# Patient Record
Sex: Female | Born: 1965 | Race: Black or African American | Hispanic: No | Marital: Single | Smoking: Never smoker
Health system: Southern US, Community
[De-identification: ages and names within clinical notes are randomized; demographics above are authoritative.]

## PROBLEM LIST (undated history)

## (undated) DIAGNOSIS — I10 Essential (primary) hypertension: Secondary | ICD-10-CM

## (undated) DIAGNOSIS — R9431 Abnormal electrocardiogram [ECG] [EKG]: Secondary | ICD-10-CM

## (undated) DIAGNOSIS — Z91199 Patient's noncompliance with other medical treatment and regimen due to unspecified reason: Secondary | ICD-10-CM

## (undated) DIAGNOSIS — E119 Type 2 diabetes mellitus without complications: Secondary | ICD-10-CM

## (undated) DIAGNOSIS — E785 Hyperlipidemia, unspecified: Secondary | ICD-10-CM

## (undated) DIAGNOSIS — T7840XA Allergy, unspecified, initial encounter: Secondary | ICD-10-CM

## (undated) DIAGNOSIS — I639 Cerebral infarction, unspecified: Secondary | ICD-10-CM

## (undated) DIAGNOSIS — K219 Gastro-esophageal reflux disease without esophagitis: Secondary | ICD-10-CM

## (undated) HISTORY — DX: Cerebral infarction, unspecified: I63.9

## (undated) HISTORY — DX: Allergy, unspecified, initial encounter: T78.40XA

## (undated) HISTORY — DX: Abnormal electrocardiogram (ECG) (EKG): R94.31

## (undated) HISTORY — DX: Essential (primary) hypertension: I10

## (undated) HISTORY — DX: Hyperlipidemia, unspecified: E78.5

## (undated) HISTORY — PX: TUBAL LIGATION: SHX77

## (undated) HISTORY — DX: Patient's noncompliance with other medical treatment and regimen due to unspecified reason: Z91.199

---

## 1999-11-17 ENCOUNTER — Other Ambulatory Visit: Admission: RE | Admit: 1999-11-17 | Discharge: 1999-11-17 | Payer: Self-pay | Admitting: Family Medicine

## 2000-06-15 ENCOUNTER — Other Ambulatory Visit: Admission: RE | Admit: 2000-06-15 | Discharge: 2000-06-15 | Payer: Self-pay | Admitting: Family Medicine

## 2004-10-14 ENCOUNTER — Ambulatory Visit: Payer: Self-pay | Admitting: *Deleted

## 2005-02-09 ENCOUNTER — Ambulatory Visit: Payer: Self-pay | Admitting: Internal Medicine

## 2005-02-10 ENCOUNTER — Ambulatory Visit: Payer: Self-pay | Admitting: Internal Medicine

## 2005-12-03 ENCOUNTER — Ambulatory Visit: Payer: Self-pay | Admitting: Internal Medicine

## 2005-12-25 ENCOUNTER — Ambulatory Visit: Payer: Self-pay | Admitting: Internal Medicine

## 2006-02-09 ENCOUNTER — Other Ambulatory Visit: Admission: RE | Admit: 2006-02-09 | Discharge: 2006-02-09 | Payer: Self-pay | Admitting: Internal Medicine

## 2006-02-09 ENCOUNTER — Encounter: Payer: Self-pay | Admitting: Internal Medicine

## 2006-02-09 ENCOUNTER — Ambulatory Visit: Payer: Self-pay | Admitting: Internal Medicine

## 2006-11-26 ENCOUNTER — Ambulatory Visit: Payer: Self-pay | Admitting: Internal Medicine

## 2007-07-12 DIAGNOSIS — I10 Essential (primary) hypertension: Secondary | ICD-10-CM | POA: Insufficient documentation

## 2007-07-18 DIAGNOSIS — E785 Hyperlipidemia, unspecified: Secondary | ICD-10-CM | POA: Insufficient documentation

## 2007-09-30 ENCOUNTER — Ambulatory Visit: Payer: Self-pay | Admitting: Nurse Practitioner

## 2008-03-02 ENCOUNTER — Ambulatory Visit: Payer: Self-pay | Admitting: Nurse Practitioner

## 2008-03-02 DIAGNOSIS — J309 Allergic rhinitis, unspecified: Secondary | ICD-10-CM | POA: Insufficient documentation

## 2008-03-02 LAB — CONVERTED CEMR LAB
AST: 17 units/L (ref 0–37)
Albumin: 4.2 g/dL (ref 3.5–5.2)
Alkaline Phosphatase: 84 units/L (ref 39–117)
BUN: 8 mg/dL (ref 6–23)
Basophils Absolute: 0 10*3/uL (ref 0.0–0.1)
Basophils Relative: 0 % (ref 0–1)
Bilirubin Urine: NEGATIVE
Creatinine, Ser: 0.85 mg/dL (ref 0.40–1.20)
Eosinophils Absolute: 0.2 10*3/uL (ref 0.0–0.7)
Glucose, Urine, Semiquant: NEGATIVE
KOH Prep: NEGATIVE
Ketones, urine, test strip: NEGATIVE
MCHC: 33.6 g/dL (ref 30.0–36.0)
MCV: 80.9 fL (ref 78.0–100.0)
Monocytes Relative: 11 % (ref 3–12)
Neutrophils Relative %: 57 % (ref 43–77)
Pap Smear: NEGATIVE
Potassium: 4.1 meq/L (ref 3.5–5.3)
RBC: 5.12 M/uL — ABNORMAL HIGH (ref 3.87–5.11)
RDW: 13.9 % (ref 11.5–15.5)
Specific Gravity, Urine: 1.02
TSH: 2.663 microintl units/mL (ref 0.350–5.50)
Urobilinogen, UA: NEGATIVE
pH: 6.5

## 2008-03-08 ENCOUNTER — Encounter (INDEPENDENT_AMBULATORY_CARE_PROVIDER_SITE_OTHER): Payer: Self-pay | Admitting: Nurse Practitioner

## 2008-04-18 ENCOUNTER — Ambulatory Visit: Payer: Self-pay | Admitting: Nurse Practitioner

## 2008-05-02 ENCOUNTER — Ambulatory Visit: Payer: Self-pay | Admitting: Nurse Practitioner

## 2009-01-28 ENCOUNTER — Observation Stay (HOSPITAL_COMMUNITY): Admission: EM | Admit: 2009-01-28 | Discharge: 2009-01-30 | Payer: Self-pay | Admitting: Family Medicine

## 2009-01-28 DIAGNOSIS — Z8711 Personal history of peptic ulcer disease: Secondary | ICD-10-CM

## 2009-01-28 HISTORY — DX: Personal history of peptic ulcer disease: Z87.11

## 2009-01-29 ENCOUNTER — Encounter (INDEPENDENT_AMBULATORY_CARE_PROVIDER_SITE_OTHER): Payer: Self-pay | Admitting: Internal Medicine

## 2009-01-30 ENCOUNTER — Encounter (INDEPENDENT_AMBULATORY_CARE_PROVIDER_SITE_OTHER): Payer: Self-pay | Admitting: Nurse Practitioner

## 2009-02-27 ENCOUNTER — Ambulatory Visit: Payer: Self-pay | Admitting: Nurse Practitioner

## 2009-02-28 LAB — CONVERTED CEMR LAB
ALT: 27 units/L (ref 0–35)
CO2: 28 meq/L (ref 19–32)
Calcium: 9.7 mg/dL (ref 8.4–10.5)
Chloride: 102 meq/L (ref 96–112)
Cholesterol: 210 mg/dL — ABNORMAL HIGH (ref 0–200)
Lymphs Abs: 1.9 10*3/uL (ref 0.7–4.0)
Monocytes Relative: 9 % (ref 3–12)
Neutro Abs: 3.2 10*3/uL (ref 1.7–7.7)
Neutrophils Relative %: 55 % (ref 43–77)
RBC: 5.53 M/uL — ABNORMAL HIGH (ref 3.87–5.11)
Sodium: 138 meq/L (ref 135–145)
Total Bilirubin: 0.7 mg/dL (ref 0.3–1.2)
Total Protein: 7.8 g/dL (ref 6.0–8.3)
VLDL: 23 mg/dL (ref 0–40)
WBC: 6 10*3/uL (ref 4.0–10.5)

## 2009-03-22 ENCOUNTER — Ambulatory Visit: Payer: Self-pay | Admitting: Nurse Practitioner

## 2009-03-22 ENCOUNTER — Encounter (INDEPENDENT_AMBULATORY_CARE_PROVIDER_SITE_OTHER): Payer: Self-pay | Admitting: Nurse Practitioner

## 2009-03-22 DIAGNOSIS — B373 Candidiasis of vulva and vagina: Secondary | ICD-10-CM | POA: Insufficient documentation

## 2009-03-22 DIAGNOSIS — K5900 Constipation, unspecified: Secondary | ICD-10-CM | POA: Insufficient documentation

## 2009-03-22 HISTORY — DX: Constipation, unspecified: K59.00

## 2009-03-22 LAB — CONVERTED CEMR LAB
GC Probe Amp, Genital: NEGATIVE
Ketones, urine, test strip: NEGATIVE
LDL Goal: 160 mg/dL
Microalb, Ur: 0.85 mg/dL (ref 0.00–1.89)
Nitrite: NEGATIVE
Preg, Serum: NEGATIVE
Specific Gravity, Urine: 1.015

## 2009-03-25 ENCOUNTER — Encounter (INDEPENDENT_AMBULATORY_CARE_PROVIDER_SITE_OTHER): Payer: Self-pay | Admitting: Nurse Practitioner

## 2009-03-26 ENCOUNTER — Encounter (INDEPENDENT_AMBULATORY_CARE_PROVIDER_SITE_OTHER): Payer: Self-pay | Admitting: Nurse Practitioner

## 2009-04-09 ENCOUNTER — Telehealth (INDEPENDENT_AMBULATORY_CARE_PROVIDER_SITE_OTHER): Payer: Self-pay | Admitting: Nurse Practitioner

## 2009-05-07 ENCOUNTER — Emergency Department (HOSPITAL_COMMUNITY): Admission: EM | Admit: 2009-05-07 | Discharge: 2009-05-07 | Payer: Self-pay | Admitting: Emergency Medicine

## 2009-05-07 ENCOUNTER — Emergency Department (HOSPITAL_COMMUNITY): Admission: EM | Admit: 2009-05-07 | Discharge: 2009-05-07 | Payer: Self-pay | Admitting: Family Medicine

## 2009-09-19 ENCOUNTER — Ambulatory Visit: Payer: Self-pay | Admitting: Nurse Practitioner

## 2009-09-27 ENCOUNTER — Ambulatory Visit (HOSPITAL_COMMUNITY): Admission: RE | Admit: 2009-09-27 | Discharge: 2009-09-27 | Payer: Self-pay | Admitting: Internal Medicine

## 2009-09-27 ENCOUNTER — Telehealth (INDEPENDENT_AMBULATORY_CARE_PROVIDER_SITE_OTHER): Payer: Self-pay | Admitting: Nurse Practitioner

## 2010-01-06 ENCOUNTER — Ambulatory Visit: Payer: Self-pay | Admitting: Nurse Practitioner

## 2010-01-06 DIAGNOSIS — R5383 Other fatigue: Secondary | ICD-10-CM

## 2010-01-06 DIAGNOSIS — R5381 Other malaise: Secondary | ICD-10-CM | POA: Insufficient documentation

## 2010-01-06 LAB — CONVERTED CEMR LAB
MCV: 81 fL (ref 78.0–100.0)
RBC: 5.1 M/uL (ref 3.87–5.11)
Retic Ct Pct: 0.9 % (ref 0.4–3.1)
WBC: 4.6 10*3/uL (ref 4.0–10.5)

## 2010-01-09 ENCOUNTER — Encounter (INDEPENDENT_AMBULATORY_CARE_PROVIDER_SITE_OTHER): Payer: Self-pay | Admitting: Nurse Practitioner

## 2010-05-02 ENCOUNTER — Telehealth (INDEPENDENT_AMBULATORY_CARE_PROVIDER_SITE_OTHER): Payer: Self-pay | Admitting: Nurse Practitioner

## 2010-06-02 ENCOUNTER — Telehealth (INDEPENDENT_AMBULATORY_CARE_PROVIDER_SITE_OTHER): Payer: Self-pay | Admitting: Nurse Practitioner

## 2010-06-05 ENCOUNTER — Encounter (INDEPENDENT_AMBULATORY_CARE_PROVIDER_SITE_OTHER): Payer: Self-pay | Admitting: Nurse Practitioner

## 2010-11-30 ENCOUNTER — Encounter: Payer: Self-pay | Admitting: Internal Medicine

## 2010-12-01 ENCOUNTER — Encounter: Payer: Self-pay | Admitting: Internal Medicine

## 2010-12-02 ENCOUNTER — Ambulatory Visit (HOSPITAL_COMMUNITY)
Admission: RE | Admit: 2010-12-02 | Discharge: 2010-12-02 | Payer: Self-pay | Source: Home / Self Care | Attending: Internal Medicine | Admitting: Internal Medicine

## 2010-12-09 NOTE — Letter (Signed)
Summary: Generic Letter  [facility]  [address] [place], [place] [address]   Phone: [phone]  Fax: [phone]       06/05/2010  KIMBELY WHITEAKER [address] [place], [place]  [address]  Dear Ms. Stash,  We have been unable to contact you by telephone.  Please call our office, at your earliest convenience, so that we may speak with you.   Sincerely,   Dutch Quint RN

## 2010-12-09 NOTE — Progress Notes (Signed)
Summary: REFILL ON BP AND BC PILLS  Phone Note Call from Patient Call back at Home Phone [phone]   Reason for Call: Refill Medication Summary of Call: MARTIN PT. MS Parks CALLED AND NEEDS HER BP MEDICATION AND BIRTHCONTROL MEDICATION. [facility] [address] Initial call taken by: Leodis Rains,  May 02, 2010 10:49 AM  Follow-up for Phone Call        desogen last filled 04/09/2009 #11 benazepril, metoprolol last filled 01/06/10 Follow-up by: Levon Hedger,  May 02, 2010 11:22 AM  Additional Follow-up for Phone Call Additional follow up Details #1::        Needs CPP. Refill OCP x 1 month and defer continuing med to PCP. Rx in basket . . . [facility] [address] ([facility]) . . to fax Additional Follow-up by: Brynda Rim,  May 02, 2010 2:38 PM    Additional Follow-up for Phone Call Additional follow up Details #2::    [facility] [place]. pt informed. Follow-up by: Levon Hedger,  May 02, 2010 3:33 PM  Prescriptions: DESOGEN 0.15-30 MG-MCG TABS (DESOGESTREL-ETHINYL ESTRADIOL) One tablet by mouth daily for contraception  #1 package x 0   Entered and Authorized by:   Tereso Newcomer PA-C   Signed by:   Tereso Newcomer PA-C on 05/02/2010   Method used:   Printed then faxed to ...       [facility]  [address] [place] [phone]* (retail)       [phone] [address] [place]  [address]       Ph: [phone] or [phone]       Fax: [phone]   RxID:   [id number] METOPROLOL TARTRATE 25 MG TABS (METOPROLOL TARTRATE) One tablet by mouth daily  #30 x 2   Entered and Authorized by:   Tereso Newcomer PA-C   Signed by:   Tereso Newcomer PA-C on 05/02/2010   Method used:   Printed then faxed to ...       [facility] [place] [phone][facility] ([facility]ail)       [phone] [address], [place]  [address]       Ph: [phone] or [phone]       Fax: [phone]   RxID:   [id number] BENAZEPRIL-HYDROCHLOROTHIAZIDE 20-25 MG TABS (BENAZEPRIL-HYDROCHLOROTHIAZIDE) One tablet  by mouth daily for blood pressure  #30 x 2   Entered and Authorized by:   Tereso Newcomer PA-C   Signed by:   Tereso Newcomer PA-C on 05/02/2010   Method used:   Printed then faxed to ...       [facility] [place] [phone]* (retail)       [phone] [address]       [place], [place]  [address]       Ph: [phone] or [phone]       Fax: [phone]   RxID:   [id number]

## 2010-12-09 NOTE — Letter (Signed)
Summary: Lipid Letter  [facility]  [address] [place], [place] [address]   Phone: [phone]  Fax: [phone]    01/09/2010  Kathleen Larson [address] [place], [place]  [address]  Dear [name]:  We have carefully reviewed your last lipid profile from 01/06/2010 and the results are noted below with a summary of recommendations for lipid management.    Cholesterol:       200     Goal: less than 200   HDL "good" Cholesterol:   46     Goal: greater than 40   LDL "bad" Cholesterol:   133     Goal: less than 130   Triglycerides:       107     Goal: less than 150    Your cholesterol is doing well.  Continue your current medications. All other labs are ok.    Current Medications: 1)    Benazepril-hydrochlorothiazide 20-25 Mg Tabs (Benazepril-hydrochlorothiazide) .... One tablet by mouth daily for blood pressure 2)    Aciphex 20 Mg Tbec (Rabeprazole sodium) .... One tablet by mouth two times a day for stomach 3)    Metoprolol Tartrate 25 Mg Tabs (Metoprolol tartrate) .... One tablet by mouth daily 4)    Pravachol 40 Mg Tabs (Pravastatin sodium) .... One tablet by mouth nightly for cholesterol 5)    Desogen 0.15-30 Mg-mcg Tabs (Desogestrel-ethinyl estradiol) .... One tablet by mouth daily for contraception 6)    Metamucil Smooth Texture 28.3 % Powd (Psyllium) .... Mix daily with full glass of water 7)    Loratadine 10 Mg Tabs (Loratadine) .... One tablet by mouth daily for allergies  If you have any questions, please call. We appreciate being able to work with you.   Sincerely,    [facility] Lehman Prom FNP

## 2010-12-09 NOTE — Progress Notes (Signed)
Summary: Refills filled x1 -- needs CPE  Phone Note Call from Patient Call back at [phone]   Summary of Call: Pt needs more refills from herbirth control and bp medications. [facility] [place] FNP Initial call taken by: Manon Hilding,  June 02, 2010 4:52 PM  Follow-up for Phone Call        Sent to N. Daphine Deutscher.  Dutch Quint RN  June 03, 2010 11:58 AM   Additional Follow-up for Phone Call Additional follow up Details #1::        Pt has completed her birth control pills which means it is time for her PAP last Rx given 04/2009 with 11 refills will send Rx to pharmacy for 1 with 1 refill but pt needs a CPE Additional Follow-up by: Lehman Prom FNP,  June 03, 2010 1:49 PM    Additional Follow-up for Phone Call Additional follow up Details #2::    Unable to leave message -- no answer.  Dutch Quint RN  June 03, 2010 3:59 PM  Unable to leave message -- no answer.  Dutch Quint RN  June 04, 2010 12:51 PM  Unable to leave message -- no answer.  Letter sent.  Dutch Quint RN  June 05, 2010 9:40 AM   Prescriptions: DESOGEN 0.15-30 MG-MCG TABS (DESOGESTREL-ETHINYL ESTRADIOL) One tablet by mouth daily for contraception  #1 package x 1   Entered and Authorized by:   Lehman Prom FNP   Signed by:   Lehman Prom FNP on 06/03/2010   Method used:   Faxed to ...       [facility] (retail)       [address] [place], [place]  [address]       Ph: [phone]       Fax: [phone]   RxID:   [id number] BENAZEPRIL-HYDROCHLOROTHIAZIDE 20-25 MG TABS (BENAZEPRIL-HYDROCHLOROTHIAZIDE) One tablet by mouth daily for blood pressure  #30 x 5   Entered and Authorized by:   Lehman Prom FNP   Signed by:   Lehman Prom FNP on 06/03/2010   Method used:   Faxed to ...       [facility] (retail)       [address] [place], [place]  [address]       Ph: [phone]       Fax:  [phone]   RxID:   [id number]

## 2010-12-09 NOTE — Assessment & Plan Note (Signed)
Summary: HTN   Vital Signs:  Patient profile:   45 year old female Menstrual status:  regular LMP:     01/02/2010 Height:      64 inches Weight:      225.7 pounds Temp:     98.0 degrees F oral Pulse rate:   74 / minute Pulse rhythm:   regular Resp:     20 per minute BP sitting:   136 / 83  (left arm) Cuff size:   large  Vitals Entered By: Levon Hedger (January 06, 2010 10:11 AM) CC: Renew meds, Hypertension Management, Lipid Management Is Patient Diabetic? No Pain Assessment Patient in pain? no       Does patient need assistance? Functional Status Self care Ambulation Normal LMP (date): 01/02/2010 LMP - Character: normal    Menses interval (days): 28 Menstrual flow (days): 5 Menstrual Status regular Enter LMP: 01/02/2010 Last PAP Result  Specimen Adequacy: Satisfactory for evaluation.   Interpretation/Result:Negative for intraepithelial Lesion or Malignancy.      CC:  Renew meds, Hypertension Management, and Lipid Management.  History of Present Illness:  Pt into the office for f/u on htn. Pt has completed her supply of BP meds - 2 days ago.     Hypertension History:      She denies headache, chest pain, and palpitations.  She notes no problems with any antihypertensive medication side effects.  Needs refills on meds.        Positive major cardiovascular risk factors include hyperlipidemia and hypertension.  Negative major cardiovascular risk factors include female age less than 33 years old, no history of diabetes, and non-tobacco-user status.        Further assessment for target organ damage reveals no history of ASHD, cardiac end-organ damage (CHF/LVH), stroke/TIA, peripheral vascular disease, renal insufficiency, or hypertensive retinopathy.    Lipid Management History:      Positive NCEP/ATP III risk factors include hypertension.  Negative NCEP/ATP III risk factors include female age less than 3 years old, no history of early menopause without  estrogen hormone replacement, non-diabetic, non-tobacco-user status, no ASHD (atherosclerotic heart disease), no prior stroke/TIA, no peripheral vascular disease, and no history of aortic aneurysm.        The patient states that she knows about the "Therapeutic Lifestyle Change" diet.  Her compliance with the TLC diet is fair.  The patient does not know about adjunctive measures for cholesterol lowering.  She expresses no side effects from her lipid-lowering medication.  Comments include: Pharmacy profile indicates that pt has not picked up this medications ince 10/2009.  The patient denies any symptoms to suggest myopathy or liver disease.      Habits & Providers  Alcohol-Tobacco-Diet     Alcohol drinks/day: 0     Tobacco Status: never  Exercise-Depression-Behavior     Does Patient Exercise: yes     Exercise Counseling: not indicated; exercise is adequate     Type of exercise: walking     Exercise (avg: min/session): <30     Times/week: 3     Have you felt down or hopeless? no     Have you felt little pleasure in things? no     Depression Counseling: not indicated; screening negative for depression     Drug Use: never  Allergies (verified): No Known Drug Allergies  Review of Systems General:  Complains of weakness; denies fever; Noticed over the past 1 months weakness and fatigue has been more pronounced.  Taking metoprolol at in the afternoon.  No current vitamins.  Resting and night and feels like she is getting a good night of rest. . CV:  Complains of fatigue; denies chest pain or discomfort; no CP or fatigue related to shortness of breath. Neuro:  Denies headaches.  Physical Exam  General:  alert.   Head:  normocephalic.   Lungs:  normal breath sounds.   Heart:  normal rate and regular rhythm.   Msk:  normal ROM.   Neurologic:  gait normal.   Skin:  color normal.   Psych:  Oriented X3 and memory intact for recent and remote.     Impression & Recommendations:  Problem  # 1:  HYPERTENSION (ICD-401.9) DASH diet will refill meds Her updated medication list for this problem includes:    Benazepril-hydrochlorothiazide 20-25 Mg Tabs (Benazepril-hydrochlorothiazide) ..... One tablet by mouth daily for blood pressure    Metoprolol Tartrate 25 Mg Tabs (Metoprolol tartrate) ..... One tablet by mouth daily  Problem # 2:  HYPERLIPIDEMIA (ICD-272.4) will check lipids today advised pt to take meds daily Her updated medication list for this problem includes:    Pravachol 40 Mg Tabs (Pravastatin sodium) ..... One tablet by mouth nightly for cholesterol  Orders: T-Lipid Profile ([id number]) T-Comprehensive Metabolic Panel ([id number])  Problem # 3:  FATIGUE (ICD-780.79) will check labs for possible cause Orders: T- * Misc. Laboratory test [phone]) T-TSH [phone]) T-Vitamin D 25-Hydroxy & 1,25 Dihydroxy [phone])  Complete Medication List: 1)  Benazepril-hydrochlorothiazide 20-25 Mg Tabs (Benazepril-hydrochlorothiazide) .... One tablet by mouth daily for blood pressure 2)  Aciphex 20 Mg Tbec (Rabeprazole sodium) .... One tablet by mouth two times a day for stomach 3)  Metoprolol Tartrate 25 Mg Tabs (Metoprolol tartrate) .... One tablet by mouth daily 4)  Pravachol 40 Mg Tabs (Pravastatin sodium) .... One tablet by mouth nightly for cholesterol 5)  Desogen 0.15-30 Mg-mcg Tabs (Desogestrel-ethinyl estradiol) .... One tablet by mouth daily for contraception 6)  Metamucil Smooth Texture 28.3 % Powd (Psyllium) .... Mix daily with full glass of water 7)  Loratadine 10 Mg Tabs (Loratadine) .... One tablet by mouth daily for allergies  Hypertension Assessment/Plan:      The patient's hypertensive risk group is category B: At least one risk factor (excluding diabetes) with no target organ damage.  Her calculated 10 year risk of coronary heart disease is 4 %.  Today's blood pressure is 136/83.  Her blood pressure goal is < 140/90.  Lipid Assessment/Plan:      Based on  NCEP/ATP III, the patient's risk factor category is "0-1 risk factors".  The patient's lipid goals are as follows: Total cholesterol goal is 200; LDL cholesterol goal is 160; HDL cholesterol goal is 40; Triglyceride goal is 150.    Patient Instructions: 1)  Your labs will be checked today as a possible cause for fatigue. 2)  You will be notified of you need any supplements. 3)  Also you should start to walk or do some sort of exercise at least 3 times per week. 4)  Follow up in 6 month for complete physical exam. 5)  Will need PHQ-9, PAP, EKG Prescriptions: PRAVACHOL 40 MG TABS (PRAVASTATIN SODIUM) One tablet by mouth nightly for cholesterol  #30 x 5   Entered and Authorized by:   Lehman Prom FNP   Signed by:   Lehman Prom FNP on 01/06/2010   Method used:   Faxed to ...       [facility] (retail)  [address] [place], [place]  [address]       Ph: [phone]       Fax: [phone]   RxID:   [id number] METOPROLOL TARTRATE 25 MG TABS (METOPROLOL TARTRATE) One tablet by mouth daily  #30 x 5   Entered and Authorized by:   Lehman Prom FNP   Signed by:   Lehman Prom FNP on 01/06/2010   Method used:   Faxed to ...       [facility] - [facility] (retail)       [address] [place], [place]  [address]       Ph: [phone]       Fax: [phone]   RxID:   [id number] BENAZEPRIL-HYDROCHLOROTHIAZIDE 20-25 MG TABS (BENAZEPRIL-HYDROCHLOROTHIAZIDE) One tablet by mouth daily for blood pressure  #30 x 5   Entered and Authorized by:   Lehman Prom FNP   Signed by:   Lehman Prom FNP on 01/06/2010   Method used:   Faxed to ...       [facility] - [facility] (retail)       [address] [place], [place]  [address]       Ph: [phone]       Fax: [phone]   RxID:   [id number]

## 2010-12-26 ENCOUNTER — Encounter (INDEPENDENT_AMBULATORY_CARE_PROVIDER_SITE_OTHER): Payer: Self-pay | Admitting: *Deleted

## 2010-12-31 NOTE — Letter (Signed)
Summary: *HSN Results Follow up  [facility]  [address] [place], [place] [address]   Phone: [phone]  Fax: [phone]      12/26/2010   AYLEE LITTRELL [address] [place], [place]  [address]   Dear  Ms. Janene Madeira,                            ____S.Drinkard,FNP   ____D. Gore,FNP       ____B. McPherson,MD   ____V. Rankins,MD    ____E. Mulberry,MD    ____N. Daphine Deutscher, FNP  ____D. Reche Dixon, MD    ____K. Philipp Deputy, MD    ____Other     This letter is to inform you that your recent test(s):  _______Pap Smear    _______Lab Test     _______X-ray    _______ is within acceptable limits  _______ requires a medication change  _______ requires a follow-up lab visit  ___X____ requires a follow-up visit with your provider   Comments:  We have been trying to reach you.  Please give [place] a call.       _________________________________________________________ If you have any questions, please contact our office                     Sincerely,  [place] Shannon [facility]

## 2011-01-14 ENCOUNTER — Emergency Department (HOSPITAL_COMMUNITY): Payer: Self-pay

## 2011-01-14 ENCOUNTER — Emergency Department (HOSPITAL_COMMUNITY)
Admission: EM | Admit: 2011-01-14 | Discharge: 2011-01-14 | Disposition: A | Discharge status: Home / Self Care | Payer: Self-pay | Attending: Emergency Medicine | Admitting: Emergency Medicine

## 2011-01-14 DIAGNOSIS — E669 Obesity, unspecified: Secondary | ICD-10-CM | POA: Insufficient documentation

## 2011-01-14 DIAGNOSIS — I10 Essential (primary) hypertension: Secondary | ICD-10-CM | POA: Insufficient documentation

## 2011-01-14 DIAGNOSIS — K219 Gastro-esophageal reflux disease without esophagitis: Secondary | ICD-10-CM | POA: Insufficient documentation

## 2011-01-14 DIAGNOSIS — R1011 Right upper quadrant pain: Secondary | ICD-10-CM | POA: Insufficient documentation

## 2011-01-14 DIAGNOSIS — K7689 Other specified diseases of liver: Secondary | ICD-10-CM | POA: Insufficient documentation

## 2011-01-14 DIAGNOSIS — N9489 Other specified conditions associated with female genital organs and menstrual cycle: Secondary | ICD-10-CM | POA: Insufficient documentation

## 2011-01-14 LAB — CBC
Platelets: 283 10*3/uL (ref 150–400)
RBC: 5.22 MIL/uL — ABNORMAL HIGH (ref 3.87–5.11)
RDW: 14.2 % (ref 11.5–15.5)
WBC: 8 10*3/uL (ref 4.0–10.5)

## 2011-01-14 LAB — COMPREHENSIVE METABOLIC PANEL
AST: 24 U/L (ref 0–37)
Albumin: 3.5 g/dL (ref 3.5–5.2)
Chloride: 102 mEq/L (ref 96–112)
Creatinine, Ser: 0.86 mg/dL (ref 0.4–1.2)
GFR calc Af Amer: >60 mL/min (ref >60)
Potassium: 3.3 mEq/L — ABNORMAL LOW (ref 3.5–5.1)
Total Bilirubin: 0.9 mg/dL (ref 0.3–1.2)
Total Protein: 7 g/dL (ref 6.0–8.3)

## 2011-01-14 LAB — DIFFERENTIAL
Basophils Relative: 0 % (ref 0–1)
Eosinophils Absolute: 0.2 10*3/uL (ref 0.0–0.7)
Eosinophils Relative: 2 % (ref 0–5)
Neutrophils Relative %: 61 % (ref 43–77)

## 2011-01-14 LAB — URINALYSIS, ROUTINE W REFLEX MICROSCOPIC
Nitrite: NEGATIVE
Protein, ur: NEGATIVE mg/dL
Urobilinogen, UA: 1 mg/dL (ref 0.0–1.0)

## 2011-01-14 MED ORDER — IOHEXOL 300 MG/ML  SOLN
100.0000 mL | Freq: Once | INTRAMUSCULAR | Status: AC | PRN
  Administered 2011-01-14: 100 mL via INTRAVENOUS

## 2011-02-02 ENCOUNTER — Encounter (INDEPENDENT_AMBULATORY_CARE_PROVIDER_SITE_OTHER): Payer: Self-pay | Admitting: Nurse Practitioner

## 2011-02-10 NOTE — Miscellaneous (Signed)
Summary: Query refill Benazepril-hctz  Clinical Lists Changes Pt. last seen 01/06/10, last filled 06/03/10 #30 with 5 refills Benazepril-HCTZ. Do you want to refill?  Refill for 30 days with 1 refill. advise pt that she needs f/u with a provider and come fasting for labs n.martin,fnp February 02, 2011 11:52 AM  Med refilled [facility], then cx and refilled [facility]. Called [phone] no answer no voice mail. Called pt. -- no answer.  Dutch Quint RN  February 03, 2011 9:58 AM  No answer.  Dutch Quint RN  February 04, 2011 8:43 AM  No answer, letter mailed Gaylyn Cheers RN  February 05, 2011 12:14 PM  Medications: Rx of BENAZEPRIL-HYDROCHLOROTHIAZIDE 20-25 MG TABS (BENAZEPRIL-HYDROCHLOROTHIAZIDE) One tablet by mouth daily for blood pressure;  #30 x 1;  Signed;  Entered by: Gaylyn Cheers RN;  Authorized by: Lehman Prom FNP;  Method used: Faxed to [facility], [address], [place], [place]  [address], Ph: [phone], Fax: [phone] Rx of BENAZEPRIL-HYDROCHLOROTHIAZIDE 20-25 MG TABS (BENAZEPRIL-HYDROCHLOROTHIAZIDE) One tablet by mouth daily for blood pressure;  #30 x 1;  Signed;  Entered by: Gaylyn Cheers RN;  Authorized by: Lehman Prom FNP;  Method used: Electronically to [facility] [address], [place], [place], [place]  [address], Ph: [phone], Fax: [phone]    Prescriptions: BENAZEPRIL-HYDROCHLOROTHIAZIDE 20-25 MG TABS (BENAZEPRIL-HYDROCHLOROTHIAZIDE) One tablet by mouth daily for blood pressure  #30 x 1   Entered by:   Gaylyn Cheers RN   Authorized by:   Lehman Prom FNP   Signed by:   Gaylyn Cheers RN on 02/02/2011   Method used:   Electronically to        [facility] (retail)       [address]       [place], [place]  [address]       Ph: [phone]       Fax: [phone]   RxID:   [id number] BENAZEPRIL-HYDROCHLOROTHIAZIDE 20-25 MG TABS  (BENAZEPRIL-HYDROCHLOROTHIAZIDE) One tablet by mouth daily for blood pressure  #30 x 1   Entered by:   Gaylyn Cheers RN   Authorized by:   Lehman Prom FNP   Signed by:   Gaylyn Cheers RN on 02/02/2011   Method used:   Faxed to ...       [facility] (retail)       [address] [place], [place]  [address]       Ph: [phone]       Fax: [phone]   RxID:   [id number]

## 2011-02-16 LAB — POCT I-STAT, CHEM 8
Creatinine, Ser: 1 mg/dL (ref 0.4–1.2)
Hemoglobin: 15.6 g/dL — ABNORMAL HIGH (ref 12.0–15.0)
Sodium: 137 mEq/L (ref 135–145)
TCO2: 29 mmol/L (ref 0–100)

## 2011-02-16 LAB — CBC
HCT: 43.5 % (ref 36.0–46.0)
Hemoglobin: 14.6 g/dL (ref 12.0–15.0)
MCHC: 33.7 g/dL (ref 30.0–36.0)
MCV: 82.2 fL (ref 78.0–100.0)
RBC: 5.29 MIL/uL — ABNORMAL HIGH (ref 3.87–5.11)
WBC: 8.6 10*3/uL (ref 4.0–10.5)

## 2011-02-16 LAB — URINALYSIS, ROUTINE W REFLEX MICROSCOPIC
Glucose, UA: NEGATIVE mg/dL
Hgb urine dipstick: NEGATIVE
Protein, ur: NEGATIVE mg/dL
pH: 6.5 (ref 5.0–8.0)

## 2011-02-16 LAB — DIFFERENTIAL
Basophils Relative: 0 % (ref 0–1)
Eosinophils Absolute: 0.1 10*3/uL (ref 0.0–0.7)
Monocytes Absolute: 0.4 10*3/uL (ref 0.1–1.0)
Monocytes Relative: 5 % (ref 3–12)

## 2011-02-16 LAB — URINE CULTURE

## 2011-02-16 LAB — URINE MICROSCOPIC-ADD ON

## 2011-02-16 LAB — HEPATIC FUNCTION PANEL
Alkaline Phosphatase: 72 U/L (ref 39–117)
Total Bilirubin: 0.6 mg/dL (ref 0.3–1.2)

## 2011-02-19 LAB — CBC
HCT: 42.1 % (ref 36.0–46.0)
Hemoglobin: 13.6 g/dL (ref 12.0–15.0)
Hemoglobin: 14 g/dL (ref 12.0–15.0)
MCV: 80.6 fL (ref 78.0–100.0)
MCV: 81.2 fL (ref 78.0–100.0)
Platelets: 290 10*3/uL (ref 150–400)
Platelets: 308 10*3/uL (ref 150–400)
RBC: 5.08 MIL/uL (ref 3.87–5.11)
RDW: 14.5 % (ref 11.5–15.5)
RDW: 14.8 % (ref 11.5–15.5)
WBC: 5.9 10*3/uL (ref 4.0–10.5)
WBC: 5.9 10*3/uL (ref 4.0–10.5)
WBC: 7.1 10*3/uL (ref 4.0–10.5)

## 2011-02-19 LAB — PROTIME-INR
INR: 0.9 (ref 0.00–1.49)
Prothrombin Time: 12.7 seconds (ref 11.6–15.2)

## 2011-02-19 LAB — BASIC METABOLIC PANEL
BUN: 4 mg/dL — ABNORMAL LOW (ref 6–23)
Calcium: 8.5 mg/dL (ref 8.4–10.5)
Creatinine, Ser: 0.82 mg/dL (ref 0.4–1.2)
GFR calc Af Amer: >60 mL/min (ref >60)
GFR calc non Af Amer: >60 mL/min (ref >60)
GFR calc non Af Amer: >60 mL/min (ref >60)
Glucose, Bld: 106 mg/dL — ABNORMAL HIGH (ref 70–99)
Sodium: 135 mEq/L (ref 135–145)

## 2011-02-19 LAB — GLUCOSE, CAPILLARY
Glucose-Capillary: 115 mg/dL — ABNORMAL HIGH (ref 70–99)
Glucose-Capillary: 148 mg/dL — ABNORMAL HIGH (ref 70–99)
Glucose-Capillary: 73 mg/dL (ref 70–99)

## 2011-02-19 LAB — DRUGS OF ABUSE SCREEN W/O ALC, ROUTINE URINE
Cocaine Metabolites: NEGATIVE
Creatinine,U: 205.2 mg/dL
Marijuana Metabolite: NEGATIVE
Methadone: NEGATIVE
Opiate Screen, Urine: NEGATIVE
Propoxyphene: NEGATIVE

## 2011-02-19 LAB — DIFFERENTIAL
Eosinophils Absolute: 0.1 10*3/uL (ref 0.0–0.7)
Eosinophils Relative: 2 % (ref 0–5)
Eosinophils Relative: 2 % (ref 0–5)
Lymphocytes Relative: 26 % (ref 12–46)
Lymphocytes Relative: 30 % (ref 12–46)
Lymphs Abs: 1.5 10*3/uL (ref 0.7–4.0)
Lymphs Abs: 2.1 10*3/uL (ref 0.7–4.0)
Monocytes Absolute: 0.5 10*3/uL (ref 0.1–1.0)
Monocytes Absolute: 0.5 10*3/uL (ref 0.1–1.0)
Monocytes Relative: 8 % (ref 3–12)

## 2011-02-19 LAB — COMPREHENSIVE METABOLIC PANEL
AST: 28 U/L (ref 0–37)
Albumin: 3.8 g/dL (ref 3.5–5.2)
Calcium: 9.3 mg/dL (ref 8.4–10.5)
Chloride: 101 mEq/L (ref 96–112)
Creatinine, Ser: 0.79 mg/dL (ref 0.4–1.2)
GFR calc Af Amer: >60 mL/min (ref >60)
Total Bilirubin: 0.7 mg/dL (ref 0.3–1.2)
Total Protein: 7.3 g/dL (ref 6.0–8.3)

## 2011-02-19 LAB — LIPID PANEL
Cholesterol: 210 mg/dL — ABNORMAL HIGH (ref 0–200)
HDL: 45 mg/dL (ref >39)
Total CHOL/HDL Ratio: 4.7 RATIO
VLDL: 11 mg/dL (ref 0–40)

## 2011-02-19 LAB — POCT CARDIAC MARKERS
CKMB, poc: 2.9 ng/mL (ref 1.0–8.0)
CKMB, poc: 4.4 ng/mL (ref 1.0–8.0)
Myoglobin, poc: 99.8 ng/mL (ref 12–200)
Troponin i, poc: <0.05 ng/mL (ref 0.00–0.09)

## 2011-02-19 LAB — TROPONIN I: Troponin I: <0.01 ng/mL (ref 0.00–0.06)

## 2011-02-19 LAB — MAGNESIUM: Magnesium: 2.3 mg/dL (ref 1.5–2.5)

## 2011-02-19 LAB — CARDIAC PANEL(CRET KIN+CKTOT+MB+TROPI)
CK, MB: 3 ng/mL (ref 0.3–4.0)
Relative Index: 1.2 (ref 0.0–2.5)
Troponin I: <0.01 ng/mL (ref 0.00–0.06)

## 2011-03-24 NOTE — Discharge Summary (Signed)
NAMEDORIANNA, Kathleen Larson           ACCOUNT NO.:  [id number]   MEDICAL RECORD NO.:  [id number]          PATIENT TYPE:  OBV   LOCATION:  3703                         FACILITY:  [facility]   PHYSICIAN:  Lonia Blood, M.D.DATE OF BIRTH:  10/09/1966   DATE OF ADMISSION:  01/28/2009  DATE OF DISCHARGE:  01/30/2009                               DISCHARGE SUMMARY   PRIMARY CARE PHYSICIAN:  [facility].   DISCHARGE DIAGNOSES:  1. Atypical chest pain.      a.     Serial cardiac enzymes negative.      b.     Transthoracic echocardiogram revealing preserved left       ventricular systolic function with no focal wall motion       abnormalities.      c.     Nuclear medicine cardiac stress test - No inducible ischemia       identified.  Overall, image is unremarkable.  2. Newly-diagnosed Helicobacter pylori infection.      a.     Positive serum immunoglobulin G.      b.     No prior history of esophagogastroduodenoscopy or formal       evaluation for peptic ulcer disease.      c.     No significant warning symptoms such as low       hemoglobin/anemia, hematemesis, melena.      d.     Empiric oral therapy on 10-day trial initiated.  3. Hypertension.  4. Obesity.  5. Hypercholesterolemia - newly-diagnosed - Pravastatin initiated.  6. Gastroesophageal reflux disease.  7. Status post surgery for ectopic pregnancy, remote past.   DISCHARGE MEDICATIONS:  1. Benazepril 25/21 p.o. daily.  2. Metoprolol 25 mg b.i.d.  3. Amoxicillin 500 mg 2 tablets b.i.d. for 10 days then stop.  4. Clarithromycin 500 mg b.i.d. for 10 days then stop.  5. Prilosec OTC 20 mg b.i.d. for 10 days then stop.  6. Pravastatin 40 mg p.o. daily.   FOLLOWUP:  Patient is advised to follow up with her primary care  physician at [facility] in 7 to 10 days for re-evaluation.  At that  time, patient should have her LFTs assessed to assure that she is  tolerating her pravastatin without difficulty.  LFTs were checked during  the hospital stay and found to be entirely normal.  Additionally,  patient's blood pressure should be followed up.  Patient should be  assessed to assure that she is tolerating her H. pylori therapy.   PROCEDURES:  1. Transthoracic echocardiogram January 29, 2009 - Limited study due to      poor acoustic windows.  The overall LV systolic function normal.      Ejection fraction estimated at 65%.  No left ventricular regional      wall motion abnormalities.  2. Nuclear medicine myocardial SPECT - Faintly reduced anteroseptal      activity near the cardiac apex on the stress and rest images as      attributed to breast attenuation and mild motion artifact.  No      additional ischemia is identified.  Overall, otherwise unremarkable  exam.   CONSULTATIONS:  Telephone consultation with [facility] to oversee  nuclear medicine stress testing.   HOSPITAL COURSE:  Ms. Kathleen Larson is a very pleasant, 45 year old,  obese female who is admitted to the hospital on the date of her  admission with complaints of chest pain.  The pain was described as  burning and aching and located in the epigastrium and the left side of  her chest.  There were no specific exacerbating or relieving factors.  The patient was admitted to the acute unit.  Cardiac enzymes were cycled  x3 and were unrevealing.  Serial EKGs were accomplished and were without  evidence of acute ST- or T-wave change.  Decision was made to pursue  inpatient risk stratification.  Transthoracic echocardiogram was carried  out and did not reveal any focal wall motion abnormalities.  Cardiology  was consulted via the phone and asked to oversee a nuclear medicine  stress test.  This was able to be accomplished.  Nuclear medicine  imaging failed to reveal any evidence of reversible ischemia.  The  patient's symptoms have improved significantly.  H. pylori IgG antibody  screen was accomplished.  This, in fact, returned positive.   Patient was  questioned as to previous evaluations for same.  She states that she has  never undergone an EGD or a formal evaluation for H. pylori or peptic  ulcer disease.  She did specifically, however, deny hematemesis,  hematochezia or melena.  She denied significant weight loss.  She was,  therefore, felt to be a candidate for a 10-day empiric treatment for H.  pylori.  She was given prescriptions and instructions to do such at the  time of her discharge.  During her hospital course, she was found to be  suffering with elevated lipids.  A CMET was obtained and LFTs were noted  to be normal.  Given her LDL of 154, initiation of cholesterol-lowering  medications was carried out.  By January 30, 2009, patient was clear for  discharge.  Her symptoms had resolved and she was otherwise clinically  stable.  She is advised to follow up with her primary care physician in  7 to 10 days, as discussed above.      Lonia Blood, M.D.  Electronically Signed     Lonia Blood, M.D.  Electronically Signed    JTM/MEDQ  D:  01/30/2009  T:  01/30/2009  Job:  [id number]   cc:   Melvern Banker

## 2011-03-24 NOTE — H&P (Signed)
NAME:  Kathleen Larson, Kathleen Larson NO.:  [id number]   MEDICAL RECORD NO.:  [id number]          PATIENT TYPE:  EMS   LOCATION:  URG                          FACILITY:  [facility]   PHYSICIAN:  Ruthy Dick, MD    DATE OF BIRTH:  01/12/1966   DATE OF ADMISSION:  01/28/2009  DATE OF DISCHARGE:                              HISTORY & PHYSICAL   PRIORITY ADMISSION HISTORY AND PHYSICAL   TIME OF ADMISSION:  2230.   CHIEF COMPLAINT:  Chest pain.   HISTORY OF PRESENT ILLNESS:  A 45 year old female with a history of  hypertension and obesity presenting with chest pain x1 day.  The chest  pain began when she was outside not doing anything in particular and not  stressed and not doing anything active.  The pain was described as a  burning, aching pain in the left side of her chest with a question of  radiation to her left neck.  The pain is intermittent with no  exacerbating or relieving factors.  When the pain persisted despite  going to sleep and persisted this morning, she decided to come to the  emergency room this afternoon.  She was given nitro and a GI cocktail,  which relieved the pain, though she then developed a headache.  She  notes that she has not had nausea, vomiting, diaphoresis, weakness,  lightheadedness.  She has also not had fevers, chills, cough, or any  other symptoms.  She has never had pain like this in the past, though  the pain is somewhat similar in distribution to her heartburn.  She has  no known drug allergies.   PAST MEDICAL HISTORY:  1. Hypertension, well-controlled as per her on Benazepril and HCT.  2. Seasonal allergies controlled with loratadine.  3. GERD, and she states that she has never had an ulcer.  4. Surgery for an ectopic pregnancy in the remote past.   MEDICATIONS:  1. Loratadine 10 mg as needed for congestion.  2. Benazepril/HCT 20/25 once a day.  3. Omeprazole 20 mg once a day.   FAMILY HISTORY:  She had a mother, who needed a kidney  transplant for  hypertension.  She has no CAD in her mother or any of her siblings.  She  does not know the history of her father.   SOCIAL HISTORY:  No smoking and no alcohol.  She had relatives in the  room and friends in the room so I could not ask about her about her drug  history.  Friends were not interested in leaving, and the patient wanted  them to stay as well.  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  She lives with her daughter, who is healthy.  She is a self-  pay.   REVIEW OF SYSTEMS:  She has normal menstrual cycle, her last menstrual  period was March 15th, and her review of systems is otherwise negative  except for occasional skipped beats that have been going on for as long  as she can remember.   PHYSICAL EXAMINATION:  VITAL SIGNS:  Temperature 98.1, pulse 75, blood  pressure  149/110, respiratory rate 14, O2 SAT 100% on room air.  GENERAL APPEARANCE:  In no apparent distress, mildly obese, African  American female sitting up chewing on ice chips.  HEENT:  Within normal limits.  PULMONARY:  Clear to auscultation bilaterally.  CARDIAC:  S1 S2, normal, regular rate and rhythm with 1 PVC during 30  seconds of auscultation.  Her pain was not reproducible with palpation  or with movement of her upper extremities.  ABDOMEN:  Bowel sounds positive and no tenderness.  EXTREMITIES:  No edema.  NEURO:  Nonfocal.  Cranial nerves II-XII intact.  PSYCH:  Appropriate.   LABORATORY DATA:  Hemoglobin 14, hematocrit 42 with an MCV of 81.  White  blood cell count 7.1 with an ANC of 4.2, platelets 308.  Sodium 138,  potassium 3.2, she is getting 40 of K-Dur, chloride 101, bicarb 28, BUN  6, creatinine 0.7, glucose 121, calcium 9.3.  Protein 7.3, albumin 3.8,  AST 20 and ALT 30, alk-phos 88, bilirubin 0.7.  D-dimer less than 0.22.  CK-MB 4.4 then 2.9.  Troponin-I of less than 0.05 x2.  Portable chest x-  ray with no apparent distress.  EKG showed nonspecific ST changes   anteriorly, but appreciated no old EKGs to compare it to.   ASSESSMENT AND PLAN BY ISSUES:  1. Chest pain, rule out acute coronary syndrome with enzymes,      electrocardiograms, telemetry, treat with a beta blocker, aspirin,      a Statin, p.r.n. morphine discontinue now because of her headache.      Continue with her PPI as a gastrointestinal etiology is in the      differential, though her CBC is normal and she denies bleeding.  We      will screen for Helicobacter pylori with a serum and check a urine      drug screen to be on the safe side.  Discussed the case with Dr.      Anne Fu, who agreed, and a nuclear medicine stress test in the a.m.      is reasonable so he ordered that with his blessing and the workup      and that stress test will determine her disposition.  We will also      screen her for hyperlipidemia and diabetes, risk stratify, and we      note the negative D-dimer, and a pulmonary embolism unlikely.      Chest x-ray negative as well.  2. Hypertension.  Add a beta blocker and continue with medications and      was previously well-controlled as per her and now her primary care      physician can follow up.  3. Prophylaxis.  Heparin prophylaxis, will monitor CBC, however, and      note worsening of her gastrointestinal symptoms, and Protonix for      her history of gastroesophageal reflux disease.   DISPOSITION:  Tele, chest pain rule out MI, admitted for observation,  stress test to determine disposition.   Addendum: Pt seen and examined by me and the above H&P confirmed and  adopted except as follows. Pt is morbidly obese and has been counseled.      Valetta Close, M.D.  Electronically Signed      Ruthy Dick, MD  Electronically Signed   JC/MEDQ  D:  01/28/2009  T:  01/28/2009  Job:  [id number]

## 2012-09-17 ENCOUNTER — Ambulatory Visit (INDEPENDENT_AMBULATORY_CARE_PROVIDER_SITE_OTHER): Payer: Managed Care, Other (non HMO) | Admitting: Emergency Medicine

## 2012-09-17 VITALS — BP 218/128 | HR 91 | Temp 98.4°F | Resp 18 | Ht 64.0 in | Wt 210.0 lb

## 2012-09-17 DIAGNOSIS — I10 Essential (primary) hypertension: Secondary | ICD-10-CM

## 2012-09-17 MED ORDER — METOPROLOL SUCCINATE ER 25 MG PO TB24: 50.0000 mg | ORAL_TABLET | Freq: Every day | ORAL | Status: DC

## 2012-09-17 MED ORDER — BENAZEPRIL-HYDROCHLOROTHIAZIDE 20-25 MG PO TABS: 1.0000 | ORAL_TABLET | Freq: Every day | ORAL | Status: DC

## 2012-09-17 NOTE — Progress Notes (Signed)
[facility] [address] [place] [address] [phone]  Date:  09/17/2012   Name:  Kathleen Larson   DOB:  1965/12/31   MRN:  [id number]  PCP:  Lehman Prom, NP    Chief Complaint: Hypertension  History of Present Illness:  Kathleen Larson is a 46 y.o. very pleasant female patient who presents with the following:  History of hypertension.  Stopped her medication 2 months ago and has checked it on a store machine and it was very high.  She has been asymptomatic.  Denies chest pain or shortness of breath or visual or neurological disturbance.  Says she ran out and could not get a refill as her clinic went out of business  Patient Active Problem List  Diagnosis  . CANDIDIASIS, VAGINAL  . HYPERLIPIDEMIA  . HYPERTENSION  . ALLERGIC RHINITIS  . CONSTIPATION  . FATIGUE  . ABDOMINAL PAIN  . HELICOBACTER PYLORI INFECTION, HX OF    Past Medical History  Diagnosis Date  . Allergy   . Hypertension     Past Surgical History  Procedure Date  . Tubal ligation     History  Substance Use Topics  . Smoking status: Never Smoker   . Smokeless tobacco: Not on file  . Alcohol Use: Yes    Family History  Problem Relation Age of Onset  . Asthma Daughter   . Cancer Maternal Grandmother     No Known Allergies  Medication list has been reviewed and updated.  Current Outpatient Prescriptions on File Prior to Visit  Medication Sig Dispense Refill  . cetirizine (ZYRTEC) 10 MG tablet Take 10 mg by mouth daily.      . benazepril-hydrochlorthiazide (LOTENSIN HCT) 20-25 MG per tablet Take 1 tablet by mouth daily.      . metoprolol succinate (TOPROL-XL) 25 MG 24 hr tablet Take 25 mg by mouth daily.        Review of Systems:  I have reviewed the patient's medical history in detail and updated the computerized patient record.   Physical Examination: Filed Vitals:   09/17/12 1517  BP: 218/128  Pulse:   Temp:   Resp:    Filed Vitals:   09/17/12 1512  Height: 5\' 4"  (1.626 m)  Weight: 210 lb (95.255 kg)   Body mass index is 36.05 kg/(m^2). Ideal Body Weight: Weight in (lb) to have BMI = 25: 145.3   GEN: WDWN, NAD, Non-toxic, A & O x 3 HEENT: Atraumatic, Normocephalic. Neck supple. No masses, No LAD.  Fundi benign Ears and Nose: No external deformity.  TM negative CV: RRR, No M/G/R. No JVD. No thrill. No extra heart sounds. PULM: CTA B, no wheezes, crackles, rhonchi. No retractions. No resp. distress. No accessory muscle use. ABD: S, NT, ND, +BS. No rebound. No HSM. EXTR: No c/c/e NEURO Normal gait.  PSYCH: Normally interactive. Conversant. Not depressed or anxious appearing.  Calm demeanor.    Assessment and Plan: Uncontrolled hypertension Noncompliance Obesity Follow up in 104 in one week Continue meds  Carmelina Dane, MD

## 2012-09-24 NOTE — Progress Notes (Signed)
Reviewed and agree.

## 2012-09-30 ENCOUNTER — Ambulatory Visit: Payer: Managed Care, Other (non HMO) | Admitting: Family Medicine

## 2012-10-21 ENCOUNTER — Ambulatory Visit (INDEPENDENT_AMBULATORY_CARE_PROVIDER_SITE_OTHER): Payer: Managed Care, Other (non HMO) | Admitting: Family Medicine

## 2012-10-21 ENCOUNTER — Encounter: Payer: Self-pay | Admitting: Family Medicine

## 2012-10-21 VITALS — BP 170/108 | HR 65 | Temp 99.5°F | Resp 16 | Ht 64.0 in | Wt 214.2 lb

## 2012-10-21 DIAGNOSIS — I10 Essential (primary) hypertension: Secondary | ICD-10-CM

## 2012-10-21 DIAGNOSIS — Z3009 Encounter for other general counseling and advice on contraception: Secondary | ICD-10-CM

## 2012-10-21 DIAGNOSIS — R51 Headache: Secondary | ICD-10-CM

## 2012-10-21 LAB — CBC WITH DIFFERENTIAL/PLATELET
Eosinophils Absolute: 0.2 10*3/uL (ref 0.0–0.7)
Lymphs Abs: 2.1 10*3/uL (ref 0.7–4.0)
MCH: 26.4 pg (ref 26.0–34.0)
Neutrophils Relative %: 59 % (ref 43–77)
Platelets: 259 10*3/uL (ref 150–400)
RBC: 5.18 MIL/uL — ABNORMAL HIGH (ref 3.87–5.11)
WBC: 6.9 10*3/uL (ref 4.0–10.5)

## 2012-10-21 LAB — BASIC METABOLIC PANEL
Calcium: 9.4 mg/dL (ref 8.4–10.5)
Chloride: 102 mEq/L (ref 96–112)
Creat: 0.8 mg/dL (ref 0.50–1.10)

## 2012-10-21 LAB — POCT URINALYSIS DIPSTICK
Bilirubin, UA: NEGATIVE
Glucose, UA: NEGATIVE
Ketones, UA: NEGATIVE
Spec Grav, UA: 1.01

## 2012-10-21 LAB — POCT UA - MICROSCOPIC ONLY
Bacteria, U Microscopic: NEGATIVE
Crystals, Ur, HPF, POC: NEGATIVE
WBC, Ur, HPF, POC: NEGATIVE

## 2012-10-21 LAB — TSH: TSH: 1.803 u[IU]/mL (ref 0.350–4.500)

## 2012-10-21 MED ORDER — BENAZEPRIL-HYDROCHLOROTHIAZIDE 20-25 MG PO TABS: 1.0000 | ORAL_TABLET | Freq: Every day | ORAL | Status: DC

## 2012-10-21 MED ORDER — NORGESTIMATE-ETH ESTRADIOL 0.25-35 MG-MCG PO TABS: 1.0000 | ORAL_TABLET | Freq: Every day | ORAL | Status: DC

## 2012-10-21 MED ORDER — AMLODIPINE BESYLATE 10 MG PO TABS: 10.0000 mg | ORAL_TABLET | Freq: Every day | ORAL | Status: DC

## 2012-10-21 NOTE — Progress Notes (Signed)
  Subjective:    Patient ID: Kathleen Larson, female    DOB: 04-15-1966, 46 y.o.   MRN: [id number] Chief Complaint  Patient presents with  . Hypertension    HPI  Since she has restarted the medication has developed a HA on the right neck.  HA comes back after taking metoprolol in the morning.  Past Medical History  Diagnosis Date  . Allergy   . Hypertension    Current Outpatient Prescriptions on File Prior to Visit  Medication Sig Dispense Refill  . cetirizine (ZYRTEC) 10 MG tablet Take 10 mg by mouth daily.       No current facility-administered medications on file prior to visit.   No Known Allergies   Review of Systems    BP 170/108  Pulse 65  Temp(Src) 99.5 F (37.5 C) (Oral)  Resp 16  Ht 5\' 4"  (1.626 m)  Wt 214 lb 3.2 oz (97.16 kg)  BMI 36.75 kg/m2  SpO2 100%  LMP 10/14/2012 Objective:   Physical Exam        Assessment & Plan:  Essential hypertension, benign - Plan: CBC with Differential, TSH, Basic metabolic panel, POCT UA - Microscopic Only, POCT urinalysis dipstick, amLODipine (NORVASC) 10 MG tablet, benazepril-hydrochlorthiazide (LOTENSIN HCT) 20-25 MG per tablet  Headache - Plan: CBC with Differential, TSH, Basic metabolic panel, POCT UA - Microscopic Only, POCT urinalysis dipstick  Family planning - Plan: norgestimate-ethinyl estradiol (ORTHO-CYCLEN,SPRINTEC,PREVIFEM) 0.25-35 MG-MCG tablet  Meds ordered this encounter  Medications  . amLODipine (NORVASC) 10 MG tablet    Sig: Take 1 tablet (10 mg total) by mouth daily.    Dispense:  30 tablet    Refill:  2  . benazepril-hydrochlorthiazide (LOTENSIN HCT) 20-25 MG per tablet    Sig: Take 1 tablet by mouth daily.    Dispense:  90 tablet    Refill:  1  . norgestimate-ethinyl estradiol (ORTHO-CYCLEN,SPRINTEC,PREVIFEM) 0.25-35 MG-MCG tablet    Sig: Take 1 tablet by mouth daily.    Dispense:  1 Package    Refill:  1

## 2013-01-13 ENCOUNTER — Ambulatory Visit: Payer: Managed Care, Other (non HMO) | Admitting: Family Medicine

## 2016-05-16 ENCOUNTER — Emergency Department (HOSPITAL_COMMUNITY): Payer: Medicaid Other

## 2016-05-16 ENCOUNTER — Inpatient Hospital Stay (HOSPITAL_COMMUNITY)
Admission: EM | Admit: 2016-05-16 | Discharge: 2016-05-20 | DRG: 065 | Disposition: A | Discharge status: Home Health | Payer: Medicaid Other | Attending: Family Medicine | Admitting: Family Medicine

## 2016-05-16 ENCOUNTER — Encounter (HOSPITAL_COMMUNITY): Payer: Self-pay

## 2016-05-16 ENCOUNTER — Inpatient Hospital Stay (HOSPITAL_COMMUNITY): Payer: Medicaid Other

## 2016-05-16 DIAGNOSIS — R488 Other symbolic dysfunctions: Secondary | ICD-10-CM | POA: Diagnosis present

## 2016-05-16 DIAGNOSIS — R7303 Prediabetes: Secondary | ICD-10-CM | POA: Diagnosis present

## 2016-05-16 DIAGNOSIS — E785 Hyperlipidemia, unspecified: Secondary | ICD-10-CM | POA: Insufficient documentation

## 2016-05-16 DIAGNOSIS — R29898 Other symptoms and signs involving the musculoskeletal system: Secondary | ICD-10-CM | POA: Insufficient documentation

## 2016-05-16 DIAGNOSIS — R471 Dysarthria and anarthria: Secondary | ICD-10-CM | POA: Diagnosis present

## 2016-05-16 DIAGNOSIS — I161 Hypertensive emergency: Secondary | ICD-10-CM | POA: Diagnosis present

## 2016-05-16 DIAGNOSIS — Z9119 Patient's noncompliance with other medical treatment and regimen: Secondary | ICD-10-CM | POA: Insufficient documentation

## 2016-05-16 DIAGNOSIS — R4781 Slurred speech: Secondary | ICD-10-CM | POA: Diagnosis present

## 2016-05-16 DIAGNOSIS — G8321 Monoplegia of upper limb affecting right dominant side: Secondary | ICD-10-CM | POA: Diagnosis present

## 2016-05-16 DIAGNOSIS — E669 Obesity, unspecified: Secondary | ICD-10-CM | POA: Diagnosis present

## 2016-05-16 DIAGNOSIS — Z9114 Patient's other noncompliance with medication regimen: Secondary | ICD-10-CM | POA: Diagnosis not present

## 2016-05-16 DIAGNOSIS — J309 Allergic rhinitis, unspecified: Secondary | ICD-10-CM | POA: Diagnosis present

## 2016-05-16 DIAGNOSIS — Z91013 Allergy to seafood: Secondary | ICD-10-CM

## 2016-05-16 DIAGNOSIS — I639 Cerebral infarction, unspecified: Principal | ICD-10-CM | POA: Diagnosis present

## 2016-05-16 DIAGNOSIS — Z8249 Family history of ischemic heart disease and other diseases of the circulatory system: Secondary | ICD-10-CM

## 2016-05-16 DIAGNOSIS — Z8673 Personal history of transient ischemic attack (TIA), and cerebral infarction without residual deficits: Secondary | ICD-10-CM | POA: Insufficient documentation

## 2016-05-16 DIAGNOSIS — Z91199 Patient's noncompliance with other medical treatment and regimen due to unspecified reason: Secondary | ICD-10-CM | POA: Insufficient documentation

## 2016-05-16 DIAGNOSIS — Z6835 Body mass index (BMI) 35.0-35.9, adult: Secondary | ICD-10-CM | POA: Diagnosis not present

## 2016-05-16 DIAGNOSIS — Z793 Long term (current) use of hormonal contraceptives: Secondary | ICD-10-CM

## 2016-05-16 DIAGNOSIS — K219 Gastro-esophageal reflux disease without esophagitis: Secondary | ICD-10-CM | POA: Diagnosis present

## 2016-05-16 DIAGNOSIS — I10 Essential (primary) hypertension: Secondary | ICD-10-CM | POA: Diagnosis present

## 2016-05-16 DIAGNOSIS — I672 Cerebral atherosclerosis: Secondary | ICD-10-CM | POA: Diagnosis present

## 2016-05-16 DIAGNOSIS — E876 Hypokalemia: Secondary | ICD-10-CM | POA: Diagnosis not present

## 2016-05-16 DIAGNOSIS — R402142 Coma scale, eyes open, spontaneous, at arrival to emergency department: Secondary | ICD-10-CM | POA: Diagnosis present

## 2016-05-16 DIAGNOSIS — R402252 Coma scale, best verbal response, oriented, at arrival to emergency department: Secondary | ICD-10-CM | POA: Diagnosis present

## 2016-05-16 DIAGNOSIS — I679 Cerebrovascular disease, unspecified: Secondary | ICD-10-CM | POA: Insufficient documentation

## 2016-05-16 DIAGNOSIS — R9431 Abnormal electrocardiogram [ECG] [EKG]: Secondary | ICD-10-CM | POA: Insufficient documentation

## 2016-05-16 DIAGNOSIS — R402362 Coma scale, best motor response, obeys commands, at arrival to emergency department: Secondary | ICD-10-CM | POA: Diagnosis present

## 2016-05-16 DIAGNOSIS — R29810 Facial weakness: Secondary | ICD-10-CM | POA: Diagnosis present

## 2016-05-16 DIAGNOSIS — Z823 Family history of stroke: Secondary | ICD-10-CM

## 2016-05-16 DIAGNOSIS — R29703 NIHSS score 3: Secondary | ICD-10-CM | POA: Diagnosis present

## 2016-05-16 DIAGNOSIS — R531 Weakness: Secondary | ICD-10-CM | POA: Diagnosis not present

## 2016-05-16 HISTORY — DX: Gastro-esophageal reflux disease without esophagitis: K21.9

## 2016-05-16 LAB — CBC
HEMATOCRIT: 46.1 % — AB (ref 36.0–46.0)
HEMOGLOBIN: 15.1 g/dL — AB (ref 12.0–15.0)
MCH: 26.6 pg (ref 26.0–34.0)
MCHC: 32.8 g/dL (ref 30.0–36.0)
MCV: 81.3 fL (ref 78.0–100.0)
Platelets: 256 10*3/uL (ref 150–400)
RBC: 5.67 MIL/uL — AB (ref 3.87–5.11)
RDW: 14.5 % (ref 11.5–15.5)
WBC: 5.9 10*3/uL (ref 4.0–10.5)

## 2016-05-16 LAB — COMPREHENSIVE METABOLIC PANEL
ALT: 22 U/L (ref 14–54)
ANION GAP: 6 (ref 5–15)
AST: 24 U/L (ref 15–41)
Albumin: 3.8 g/dL (ref 3.5–5.0)
Alkaline Phosphatase: 105 U/L (ref 38–126)
BUN: 8 mg/dL (ref 6–20)
CHLORIDE: 105 mmol/L (ref 101–111)
CO2: 28 mmol/L (ref 22–32)
Calcium: 9.5 mg/dL (ref 8.9–10.3)
Creatinine, Ser: 0.88 mg/dL (ref 0.44–1.00)
Glucose, Bld: 118 mg/dL — ABNORMAL HIGH (ref 65–99)
POTASSIUM: 3.6 mmol/L (ref 3.5–5.1)
SODIUM: 139 mmol/L (ref 135–145)
Total Bilirubin: 1.4 mg/dL — ABNORMAL HIGH (ref 0.3–1.2)
Total Protein: 7.5 g/dL (ref 6.5–8.1)

## 2016-05-16 LAB — APTT: APTT: 31 s (ref 24–37)

## 2016-05-16 LAB — LIPID PANEL
CHOL/HDL RATIO: 4.7 ratio
CHOLESTEROL: 277 mg/dL — AB (ref 0–200)
HDL: 59 mg/dL (ref >40)
LDL Cholesterol: 195 mg/dL — ABNORMAL HIGH (ref 0–99)
Triglycerides: 115 mg/dL (ref <150)
VLDL: 23 mg/dL (ref 0–40)

## 2016-05-16 LAB — DIFFERENTIAL
BASOS ABS: 0 10*3/uL (ref 0.0–0.1)
BASOS PCT: 0 %
EOS ABS: 0.2 10*3/uL (ref 0.0–0.7)
EOS PCT: 3 %
Lymphocytes Relative: 36 %
Lymphs Abs: 2.1 10*3/uL (ref 0.7–4.0)
MONO ABS: 0.4 10*3/uL (ref 0.1–1.0)
Monocytes Relative: 8 %
NEUTROS PCT: 53 %
Neutro Abs: 3.1 10*3/uL (ref 1.7–7.7)

## 2016-05-16 LAB — MRSA PCR SCREENING: MRSA by PCR: NEGATIVE

## 2016-05-16 LAB — I-STAT TROPONIN, ED: TROPONIN I, POC: 0.01 ng/mL (ref 0.00–0.08)

## 2016-05-16 LAB — PROTIME-INR
INR: 1.05 (ref 0.00–1.49)
Prothrombin Time: 13.9 seconds (ref 11.6–15.2)

## 2016-05-16 LAB — TROPONIN I
Troponin I: 0.04 ng/mL (ref <0.03)
Troponin I: 0.03 ng/mL (ref <0.03)

## 2016-05-16 LAB — TSH: TSH: 1.381 u[IU]/mL (ref 0.350–4.500)

## 2016-05-16 MED ORDER — LORAZEPAM 2 MG/ML IJ SOLN: 0.5000 mg | Freq: Once | INTRAMUSCULAR | Status: DC | PRN

## 2016-05-16 MED ORDER — ASPIRIN 300 MG RE SUPP: 300.0000 mg | Freq: Every day | RECTAL | Status: DC

## 2016-05-16 MED ORDER — CLEVIDIPINE BUTYRATE 0.5 MG/ML IV EMUL
0.0000 mg/h | INTRAVENOUS | Status: DC
  Administered 2016-05-16: 2 mg/h via INTRAVENOUS
  Filled 2016-05-16: qty 50

## 2016-05-16 MED ORDER — STROKE: EARLY STAGES OF RECOVERY BOOK
Freq: Once | Status: AC
  Administered 2016-05-16: 17:00:00
  Filled 2016-05-16: qty 1

## 2016-05-16 MED ORDER — LABETALOL HCL 5 MG/ML IV SOLN
5.0000 mg | INTRAVENOUS | Status: DC | PRN
  Administered 2016-05-17 – 2016-05-18 (×5): 5 mg via INTRAVENOUS
  Filled 2016-05-16 (×5): qty 4

## 2016-05-16 MED ORDER — SENNOSIDES-DOCUSATE SODIUM 8.6-50 MG PO TABS: 1.0000 | ORAL_TABLET | Freq: Every evening | ORAL | Status: DC | PRN

## 2016-05-16 MED ORDER — ASPIRIN 325 MG PO TABS
325.0000 mg | ORAL_TABLET | Freq: Every day | ORAL | Status: DC
  Administered 2016-05-16: 325 mg via ORAL
  Filled 2016-05-16: qty 1

## 2016-05-16 MED ORDER — ACETAMINOPHEN 650 MG RE SUPP: 650.0000 mg | RECTAL | Status: DC | PRN

## 2016-05-16 MED ORDER — ASPIRIN 81 MG PO CHEW
81.0000 mg | CHEWABLE_TABLET | Freq: Every day | ORAL | Status: DC
  Administered 2016-05-17: 81 mg via ORAL
  Filled 2016-05-16: qty 1

## 2016-05-16 MED ORDER — ACETAMINOPHEN 325 MG PO TABS: 650.0000 mg | ORAL_TABLET | ORAL | Status: DC | PRN

## 2016-05-16 MED ORDER — ATORVASTATIN CALCIUM 80 MG PO TABS
80.0000 mg | ORAL_TABLET | Freq: Every day | ORAL | Status: DC
  Administered 2016-05-17 – 2016-05-19 (×3): 80 mg via ORAL
  Filled 2016-05-16 (×3): qty 1

## 2016-05-16 MED ORDER — LABETALOL HCL 5 MG/ML IV SOLN
5.0000 mg | INTRAVENOUS | Status: DC | PRN
Start: 2016-05-16 — End: 2016-05-16

## 2016-05-16 NOTE — ED Notes (Signed)
Patient here with right hand numbness and slurred that started yesterday. Denies headache. Alert and oriented on arrival. States took her BP meds yesterday

## 2016-05-16 NOTE — ED Notes (Signed)
Family at bedside. 

## 2016-05-16 NOTE — Consult Note (Signed)
Referring Physician: Dr Deirdre Priesthambliss    Chief Complaint: Right-sided weakness  HPI: Kathleen Larson is an 50 y.o. female with a history of hypertension developed right hand numbness the day prior to admission while getting ready for work at approximately 5:30 in the morning. The patient states that she did not awake up with the symptom. She decided to go on to work and her deficits progressed throughout the day. Today she did not feel well and presented to the emergency department for further evaluation. The patient did not describe weakness although there is RUE weakness on exam. Her deficits are confined to the right upper extremity.   A CT scan of the head revealed no evidence of acute intracranial abnormality. Focal hypodensity in the right periventricular white matter overlying the right frontal horn, as well as within the left basal ganglia. An MRI / MRA is pending.   The patient reported that she did not take her blood pressure medication this a.m. but denied missing any other doses. In the emergency department her blood pressure was significantly elevated and she was placed on a Cleviprex drip. Her last blood pressure was 193/122. She also had sinus tachycardia with rates in the 120 to 130 range. She denied chest pain or shortness of breath. Family members who accompanied the patient reported that her speech did not sound normal but no obvious slurring was detected on exam.  Date last known well: 05/15/2016 Time last known well: 5:30 tPA Given: No TPA due to late presentation.  Past Medical History  Diagnosis Date  . Allergy   . Hypertension   . GERD (gastroesophageal reflux disease)     Past Surgical History  Procedure Laterality Date  . Tubal ligation      Family History  Problem Relation Age of Onset  . Asthma Daughter   . Cancer Maternal Grandmother    The patient reports her mother had a small stroke.   Social History:  reports that she has never smoked. She does not  have any smokeless tobacco history on file. She reports that she drinks alcohol. She reports that she does not use illicit drugs.  Allergies: No Known Allergies  Medications:  Current Facility-Administered Medications  Medication Dose Route Frequency Provider Last Rate Last Dose  . clevidipine (CLEVIPREX) infusion 0.5 mg/mL  0-21 mg/hr Intravenous Continuous Margarita Grizzleanielle Ray, MD 16 mL/hr at 05/16/16 1340 8 mg/hr at 05/16/16 1340   Current Outpatient Prescriptions  Medication Sig Dispense Refill  . Aspirin-Salicylamide-Caffeine (BC HEADACHE POWDER PO) Take 1 packet by mouth every 8 (eight) hours as needed (pain).    Marland Kitchen. lisinopril-hydrochlorothiazide (PRINZIDE,ZESTORETIC) 10-12.5 MG tablet Take 1 tablet by mouth daily.    Marland Kitchen. amLODipine (NORVASC) 10 MG tablet Take 1 tablet (10 mg total) by mouth daily. (Patient not taking: Reported on 05/16/2016) 30 tablet 2  . benazepril-hydrochlorthiazide (LOTENSIN HCT) 20-25 MG per tablet Take 1 tablet by mouth daily. (Patient not taking: Reported on 05/16/2016) 90 tablet 1  . norgestimate-ethinyl estradiol (ORTHO-CYCLEN,SPRINTEC,PREVIFEM) 0.25-35 MG-MCG tablet Take 1 tablet by mouth daily. (Patient not taking: Reported on 05/16/2016) 1 Package 1     ROS: History obtained from the patient  General ROS: negative for - chills, fatigue, fever, night sweats, weight gain or weight loss Psychological ROS: negative for - behavioral disorder, hallucinations, memory difficulties, mood swings or suicidal ideation Ophthalmic ROS: negative for - blurry vision, double vision, eye pain or loss of vision ENT ROS: negative for - epistaxis, nasal discharge, oral lesions, sore throat, tinnitus  or vertigo Allergy and Immunology ROS: negative for - hives or itchy/watery eyes Hematological and Lymphatic ROS: negative for - bleeding problems, bruising or swollen lymph nodes Endocrine ROS: negative for - galactorrhea, hair pattern changes, polydipsia/polyuria or temperature  intolerance Respiratory ROS: negative for - cough, hemoptysis, shortness of breath or wheezing Cardiovascular ROS: negative for - chest pain, dyspnea on exertion, edema or irregular heartbeat Gastrointestinal ROS: negative for - abdominal pain, diarrhea, hematemesis, nausea/vomiting or stool incontinence. Positive for occasional reflux. Genito-Urinary ROS: negative for - dysuria, hematuria, incontinence or urinary frequency/urgency Musculoskeletal ROS: negative for - joint swelling or muscular weakness. Positive for recent mild right lower extremity edema. Neurological ROS: as noted in HPI Dermatological ROS: negative for rash and skin lesion changes   Physical Examination: Blood pressure 166/103, pulse 86, temperature 98.1 F (36.7 C), temperature source Oral, resp. rate 16, last menstrual period 10/14/2012, SpO2 97 %.  General - pleasant 50 year old female in no acute distress. Heart - Regular rate and rhythm - rapid rate. Lungs - Clear to auscultation Abdomen - Soft - obese - non tender Extremities - Distal pulses intact - trace right lower extremity edema. Skin - Warm and dry  Mental Status: Alert, oriented, thought content appropriate.  Speech fluent without evidence of aphasia.  Able to follow 3 step commands without difficulty. Cranial Nerves: II: Discs not visualized; Visual fields grossly normal, pupils equal, round, reactive to light and accommodation III,IV, VI: ptosis not present, extra-ocular motions intact bilaterally V,VII: smile symmetric, facial light touch sensation normal bilaterally VIII: hearing normal bilaterally IX,X: gag reflex present XI: bilateral shoulder shrug XII: midline tongue extension Motor: Right : Upper extremity   3-4 /5    Left:     Upper extremity   5/5  Lower extremity   5/5     Lower extremity   5/5 Tone and bulk:normal tone throughout; no atrophy noted Sensory: Decreased sensation to light touch of the right upper extremity Deep Tendon  Reflexes: 3+ and symmetric throughout Plantars: Right: downgoing   Left: downgoing Cerebellar: normal finger-to-nose except for some difficulty with the right upper extremity secondary to weakness. Normal heel-to-shin test Gait: Deferred     Laboratory Studies:  Basic Metabolic Panel:  Recent Labs Lab 05/16/16 1100  NA 139  K 3.6  CL 105  CO2 28  GLUCOSE 118*  BUN 8  CREATININE 0.88  CALCIUM 9.5    Liver Function Tests:  Recent Labs Lab 05/16/16 1100  AST 24  ALT 22  ALKPHOS 105  BILITOT 1.4*  PROT 7.5  ALBUMIN 3.8   No results for input(s): LIPASE, AMYLASE in the last 168 hours. No results for input(s): AMMONIA in the last 168 hours.  CBC:  Recent Labs Lab 05/16/16 1100  WBC 5.9  NEUTROABS 3.1  HGB 15.1*  HCT 46.1*  MCV 81.3  PLT 256    Cardiac Enzymes: No results for input(s): CKTOTAL, CKMB, CKMBINDEX, TROPONINI in the last 168 hours.  BNP: Invalid input(s): POCBNP  CBG: No results for input(s): GLUCAP in the last 168 hours.  Microbiology: Results for orders placed or performed during the hospital encounter of 05/07/09  Urine culture     Status: None   Collection Time: 05/07/09  1:55 PM  Result Value Ref Range Status   Specimen Description URINE, CLEAN CATCH  Final   Special Requests ADDED [id number]  Final   Colony Count 85,000 COLONIES/ML  Final   Culture   Final    Multiple bacterial morphotypes present,  none predominant. Suggest appropriate recollection if clinically indicated.   Report Status 05/08/2009 FINAL  Final    Coagulation Studies:  Recent Labs  05/16/16 1100  LABPROT 13.9  INR 1.05    Urinalysis: No results for input(s): COLORURINE, LABSPEC, PHURINE, GLUCOSEU, HGBUR, BILIRUBINUR, KETONESUR, PROTEINUR, UROBILINOGEN, NITRITE, LEUKOCYTESUR in the last 168 hours.  Invalid input(s): APPERANCEUR  Lipid Panel:    Component Value Date/Time   CHOL 210* 02/27/2009 2142   TRIG 114 02/27/2009 2142   HDL 56 02/27/2009  2142   CHOLHDL 3.8 Ratio 02/27/2009 2142   VLDL 23 02/27/2009 2142   LDLCALC 131* 02/27/2009 2142    HgbA1C: No results found for: HGBA1C  Urine Drug Screen:     Component Value Date/Time   LABOPIA NEGATIVE 01/29/2009 0800   COCAINSCRNUR NEGATIVE 01/29/2009 0800   LABBENZ NEGATIVE 01/29/2009 0800   AMPHETMU NEGATIVE 01/29/2009 0800    Alcohol Level: No results for input(s): ETH in the last 168 hours.  Other results: EKG:- Sinus rhythm rate 63 bpm with diffuse ST-T wave changes. Please refer to the formal cardiology reading for complete details.  Imaging:   Ct Head Wo Contrast 05/16/2016   No CT evidence of acute intracranial abnormality. Focal hypodensity in the right periventricular white matter overlying the right frontal horn, as well as within the left basal ganglia. These may represent prior episode of infarction or demyelination.     MRI / MRA HEAD - pending     Assessment: 50 y.o. female with a history of hypertension and obesity who developed right upper extremity weakness and numbness which has progressed since yesterday morning. The patient's family has noted some mild dysarthria. The patient states she was not aware of this. Head CT revealed focal hypodensity in the right periventricular white matter as well as the left basal ganglia. MRI / MRA HEAD - pending. The patient's blood pressure is significantly elevated and she is tachycardic with diffuse EKG abnormalities.   Stroke Risk Factors - family history, hyperlipidemia, obesity and hypertension.   Plan: 1. HgbA1c, fasting lipid panel 2. MRI, MRA  of the brain without contrast 3. PT consult, OT consult, Speech consult 4. Echocardiogram 5. Carotid dopplers 6. Prophylactic therapy- Aspirin 81 mg daily vs aspirin suppository 300 mg daily if patient fails swallowing eval. 7. NPO until RN stroke swallow screen 8. Telemetry monitoring 9. Frequent neuro checks 10. UDS 11. May need cardiology  evaluation   Further recommendations to follow per Dr. Denny Peon Rinehuls PA-C [facility] Pager [phone] 05/16/2016, 3:37 PM  Neurology Attending Addendum:  This patient was seen, examined, and d/w PA. I have reviewed the note and agree with the findings, assessment and plan as documented with the following additions.   In brief, this is a 50-yo RH woman who first noted numbness in her R hand at 0530 on 05/15/16. She did not seek medical attention and went to work, where her symptoms seemed to worsen and now include slurred speech. She came to the ED this morning because of worsening of her symptoms.   On my exam, she has mildly slurred speech, no aphasia. Mental status is normal. Cranial nerves are normal. She has 4+/5 R grip and R wrist extension and 4/5 R triceps. There is a slight R pronator drift. Sensation is intact to light touch and pin. DTRs are 3+ in the LUE, 2+ in the RUE. She has brisk 2+ reflexes at both knees. Ankle jerks are 2+. The right toe is  upgoing, the left is downgoing. She has a positive Hoffman's on the right.   I have personally and independently reviewed the [facility] without contrast from today. This shows a well-defined area of hypodensity in the right periventricular white matter consistent with a chronic lacunar infarct. Another focal area of hypodensity is present in the left thalamus that likely represents a subacute to chronic lacunar infarct.   Labs reviewed. Pertinent labs include glucose 118  A/P: 1. 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 It is most likely thrombotic in etiology. Known risk factors for cerebrovascular disease in this patient include HTN and obesity. Additional workup will be ordered to include MRI brain, MRA of the head, carotid Dopplers, TTE, fasting lipids, and hemoglobin a1c.Further testing will be determined by results from these initial studies. Recommend antiplatelet therapy with  aspirin 81 mg daily for secondary stroke prevention once cleared to take oral medications. Continue statin with goal LDL less than 70. Ensure adequate glucose control. Allow permissive hypertension in the acute phase, treating only SBP greater than 220 mmHg and/or DBP greater than 110 mmHg. BP can be gradually lowered to goal after 24 hours. Avoid fever and hyperglycemia as these can extend the infarct. Avoid hypotonic IVF to minimize exacerbation of post-stroke edema. Initiate rehab services. DVT prophylaxis as needed.   2. RUE weakness: This is due to stroke. PT/OT to evaluate and treat.   3. Dysarthria: This is acute, due to stroke. ST to eval and treat. NPO until passes swallow eval.   D/w patient and her sister at the bedside. They are in agreement with the plan as noted. They were given the opportunity to ask any questions and these were addressed to their satisfaction.

## 2016-05-16 NOTE — ED Notes (Signed)
Pt returned from CT °

## 2016-05-16 NOTE — ED Notes (Signed)
Paged Family practice [place], [place]

## 2016-05-16 NOTE — ED Provider Notes (Addendum)
CSN: [id number]     Arrival date & time 05/16/16  1047 History   First MD Initiated Contact with Patient 05/16/16 1111     Chief Complaint  Patient presents with  . right hand numbness   . Aphasia     (Consider location/radiation/quality/duration/timing/severity/associated sxs/prior Treatment) HPI  50 year old female presents today with right hand weakness that began on awakening at 5:30 AM yesterday. She was able to go to work yesterday. She states that the symptoms are worse today. She has a history of hypertension and takes fosinopril. Her last lisinopril was yesterday morning. She denies any headache, vision changes, or difficulty swallowing. She is having some slurring of her speech. She denies any previous history of stroke. She denies any history of irregular heartbeat, vascular disease, or diabetes. Vision is normal.  Past Medical History  Diagnosis Date  . Allergy   . Hypertension   . GERD (gastroesophageal reflux disease)    Past Surgical History  Procedure Laterality Date  . Tubal ligation     Family History  Problem Relation Age of Onset  . Asthma Daughter   . Cancer Maternal Grandmother    Social History  Substance Use Topics  . Smoking status: Never Smoker   . Smokeless tobacco: None  . Alcohol Use: Yes   OB History    No data available     Review of Systems  All other systems reviewed and are negative.     Allergies  Review of patient's allergies indicates no known allergies.  Home Medications   Prior to Admission medications   Medication Sig Start Date End Date Taking? Authorizing Provider  amLODipine (NORVASC) 10 MG tablet Take 1 tablet (10 mg total) by mouth daily. 10/21/12   Sherren Mocha, MD  benazepril-hydrochlorthiazide (LOTENSIN HCT) 20-25 MG per tablet Take 1 tablet by mouth daily. 10/21/12   Sherren Mocha, MD  cetirizine (ZYRTEC) 10 MG tablet Take 10 mg by mouth daily.    Historical Provider, MD  norgestimate-ethinyl estradiol  (ORTHO-CYCLEN,SPRINTEC,PREVIFEM) 0.25-35 MG-MCG tablet Take 1 tablet by mouth daily. 10/21/12   Sherren Mocha, MD   BP 218/112 mmHg  Pulse 72  Temp(Src) 98.1 F (36.7 C) (Oral)  Resp 18  SpO2 100%  LMP 10/14/2012 Physical Exam  Constitutional: She is oriented to person, place, and time. She appears well-developed and well-nourished.  HENT:  Head: Normocephalic and atraumatic.  Right Ear: External ear normal.  Left Ear: External ear normal.  Nose: Nose normal.  Mouth/Throat: Oropharynx is clear and moist.  Eyes: Conjunctivae and EOM are normal. Pupils are equal, round, and reactive to light.  Neck: Normal range of motion. Neck supple.  Cardiovascular: Normal rate, regular rhythm, normal heart sounds and intact distal pulses.   Pulmonary/Chest: Effort normal and breath sounds normal.  Abdominal: Soft. Bowel sounds are normal.  Musculoskeletal: Normal range of motion.  Neurological: She is alert and oriented to person, place, and time. She has normal reflexes. She displays normal reflexes. She exhibits normal muscle tone. Coordination normal.  Some right facial droop which appears to be more asymmetry of mouth Right hand grip is weak. She is unable to raise her right arm laterally or to hold up. Lower extremity strength is equal bilaterally Lower extremity movements are intact  Skin: Skin is warm and dry.  Psychiatric: She has a normal mood and affect. Her behavior is normal. Judgment and thought content normal.  Nursing note and vitals reviewed.   ED Course  Procedures (including critical care  time) Labs Review Labs Reviewed  CBC - Abnormal; Notable for the following:    RBC 5.67 (*)    Hemoglobin 15.1 (*)    HCT 46.1 (*)    All other components within normal limits  COMPREHENSIVE METABOLIC PANEL - Abnormal; Notable for the following:    Glucose, Bld 118 (*)    Total Bilirubin 1.4 (*)    All other components within normal limits  PROTIME-INR  APTT  DIFFERENTIAL  I-STAT  TROPOININ, ED    Imaging Review Ct Head Wo Contrast  05/16/2016  CLINICAL DATA:  50 year old female with a history of right hand numbness. EXAM: CT HEAD WITHOUT CONTRAST TECHNIQUE: Contiguous axial images were obtained from the base of the skull through the vertex without intravenous contrast. COMPARISON:  None. FINDINGS: Unremarkable appearance of the calvarium without acute fracture or aggressive lesion. Unremarkable appearance of the scalp soft tissues. Unremarkable appearance of the bilateral orbits. Mastoid air cells are clear. No significant paranasal sinus disease No acute intracranial hemorrhage, midline shift, or mass effect. Gray-white differentiation is maintained, without CT evidence of acute ischemia. Unremarkable configuration of the ventricles. Focal hypodensity in the periventricular white matter of the right frontal horn. Focal hypodensity in the left basal ganglia. IMPRESSION: No CT evidence of acute intracranial abnormality. Focal hypodensity in the right periventricular white matter overlying the right frontal horn, as well as within the left basal ganglia. These may represent prior episode of infarction or demyelination. Signed, Yvone NeuJaime S. Loreta AveWagner, DO [facility] Electronically Signed   By: Gilmer MorJaime  Wagner D.O.   On: 05/16/2016 12:24   I have personally reviewed and evaluated these images and lab results as part of my medical decision-making.   EKG Interpretation   Date/Time:  Saturday May 16 2016 11:10:00 EDT Ventricular Rate:  63 PR Interval:  146 QRS Duration: 104 QT Interval:  416 QTC Calculation: 425 R Axis:   57 Text Interpretation:  Normal sinus rhythm Cannot rule out Anterior infarct  , age undetermined ST \\T \ T wave abnormality, consider inferolateral  ischemia Abnormal ECG Confirmed by Yatziri Wainwright MD, Duwayne HeckANIELLE [phone]([id number]) on 05/16/2016  11:39:47 AM      MDM   Final diagnoses:  Cerebrovascular accident (CVA),  unspecified mechanism (HCC)  Essential hypertension   50 year old female history of hypertension presents today with right hand numbness, weakness, and slurred speech that began at 5:30 AM yesterday placing her at 30 hours since last known normal. Workup here is significant for retention and physical evaluation consistent with stroke.   Discussed with Dr. Roxy Mannsster on call for neurology. He agrees with continuing) [facility]. Patient is unassigned and family practice was consulted for admission.  Patient discussed with Dr. [name] and will be admitted to Dr. Russ Halohambliss's service. 1-stroke 2 hypertension- cleviprex initiated with sbp to 190 now.  Goal 160  CRITICAL CARE Performed by: Hilario QuarryAY,Aundra Espin S Total critical care time: 60 minutes Critical care time was exclusive of separately billable procedures and treating other patients. Critical care was necessary to treat or prevent imminent or life-threatening deterioration. Critical care was time spent personally by me on the following activities: development of treatment plan with patient and/or surrogate as well as nursing, discussions with consultants, evaluation of patient's response to treatment, examination of patient, obtaining history from patient or surrogate, ordering and performing treatments and interventions, ordering and review of laboratory studies, ordering and review of radiographic studies, pulse oximetry and re-evaluation of patient's condition.   Margarita Grizzleanielle Lacole Komorowski, MD 05/16/16 1401  Nishawn Rotan  Feliz Herard, MD 05/16/16 1610

## 2016-05-16 NOTE — ED Notes (Signed)
MD at bedside.RAY 

## 2016-05-16 NOTE — ED Notes (Signed)
Per admitting md neurology states pts goal bp is 220/110. Verbal order to d/c cleviprex.

## 2016-05-16 NOTE — ED Notes (Signed)
Charge rn on 2s calling house a/c to see if floor is appropriate for medication.

## 2016-05-16 NOTE — H&P (Signed)
Family Medicine [facility] Admission History and Physical Service Pager: [phone]  Patient name: Kathleen Larson Medical record number: [id number] Date of birth: 04-18-1966 Age: 50 y.o. Gender: female  Primary Care Provider: No primary care provider on file. Consultants: Neuro Code Status: FULL (Discussed on admission)  Chief Complaint: RUE weakness/numbness and slurred speech  Assessment and Plan: Kathleen Larson is a 50 y.o. female presenting with right hand numbness, weakness and slurred speach . PMH is significant for HTN, HLD, constipation  # Right hand numbness, weakness and slurred speech, suspect CVA: In ED, BP 264/136, down to 166/103 on Cleviprex gtt.  All other vitals stable.  CT head negative for acute processes.  EKG with some nonspecific ST changes in several leads.  No evidence of other end organ damage w. Negative iTrop, Cr 0.88.  Exam significant for RUE weakness, slurred speech and right hand numbness.  Otherwise, non focal.  Suspect CVA is secondary to uncontrolled hypertension. - Admit to SDU for marked hypertension.  Apparently, Cleviprex gtt requires ICU admission.  BP now within permissive HTN range - Will admit to SDU and give Labetalol 5mg  q2 IV PRN SBP>220 or DBP>110.  Verified BP goals with Neuro PA at bedside. - Telemetry - VS per floor protocol  - Neuro c/s in ED, appreciate recs - Lipid panel, A1c, TSH - repeat BMP in am - trend troponin - repeat EKG in am - MRI/MRA brain, has shrimp allergy.  Discussed with pharmacy.  She does not think patient needs premeds for MRI/MRA contrast. - Carotid dopplers - c/s PT/ OT/ SLP - NPO until SLP evaluates - Consider formal c/s to cards if persistent EKG changes or elevation in troponin.  Spoke to on call Cardiologist, Dr Royann Shivers, to review EKG.  Fells that changes are most consistent with an LVH picture but notes that it would be reasonable to r/o for MI/ repeat EKG and obtain echo.  Appreciate  assistance. - Echo ordered - ASA 325mg  qd  # Accelerated HTN/ Hypertensive emergency:  on Cleviprex gtt here.  Apparently, has h/o HTN.  On Lisinopril-HCTZ outpatient but has not refilled in several years. - Per neuro: permissive HTN in the setting of acute CVA - Continue gtt as above - Transition to PO antihypertensives when able  # HLD: - Lipid panel - Lipitor 80mg  order to be given once swallow screen passed.  FEN/GI: NPO until assessed by SLP/ RN bedside Prophylaxis: SCDs for now  Disposition: Admit to SDU.  Anticipate can be transitioned to telemetry neuro unit once off gtt.  History of Present Illness:  Kathleen Larson is a 50 y.o. female presenting with RUE weakness, slurred speech and right hand numbness  Patient notes that she was in her usual state of health until yesterday morning when she notes her right hand felt funny.  She reports that she found it difficult to hold a pencil and her family members were telling her that her speech sounded slurred, so she came to the ED for evaluation.  She notes that she has a h/o HTN but does not take BP medications consistently, sometimes only taking medication 3 days out of the week.  She is followed by a physician on [facility], but cannot recall their name at this time.  She denies vision changes, headache, CP, SOB, LE edema, difficulty walking, LE weakness/numbness, confusion, difficulty voiding.  She does not feel that her speech is abnormal, but her family at bedside disagree.  No personal h/o DM, HLD, stroke,  MI, clotting disorder.  She reports that her mother had a CVA at 80.  She notes that she is worried about having MRI because she does not think she can relax for that long.  Additionally, she is a nonsmoker, denies ETOH or drug use.  She lives at home alone and performs ADLs independently.  Review Of Systems: Per HPI with the following additions: none Otherwise the remainder of the systems were negative.  Patient Active Problem  List   Diagnosis Date Noted  . Stroke (HCC) 05/16/2016  . Accelerated hypertension 05/16/2016  . Hypertensive emergency 05/16/2016  . Cerebrovascular accident (CVA) (HCC)   . Essential hypertension   . Obesity   . Abnormal EKG   . Hypertension 09/17/2012  . FATIGUE 01/06/2010  . CANDIDIASIS, VAGINAL 03/22/2009  . CONSTIPATION 03/22/2009  . HELICOBACTER PYLORI INFECTION, HX OF 01/28/2009  . ALLERGIC RHINITIS 03/02/2008  . ABDOMINAL PAIN 03/02/2008  . HYPERLIPIDEMIA 07/18/2007  . HYPERTENSION 07/12/2007    Past Medical History: Past Medical History  Diagnosis Date  . Allergy   . Hypertension   . GERD (gastroesophageal reflux disease)     Past Surgical History: Past Surgical History  Procedure Laterality Date  . Tubal ligation      Social History: Social History  Substance Use Topics  . Smoking status: Never Smoker   . Smokeless tobacco: None  . Alcohol Use: Yes   Additional social history: none  Please also refer to relevant sections of EMR.  Family History: Family History  Problem Relation Age of Onset  . Asthma Daughter   . Cancer Maternal Grandmother   . Stroke Mother     10 yo   Allergies and Medications: Allergies  Allergen Reactions  . Shrimp [Shellfish Allergy] Swelling    Of lips   No current facility-administered medications on file prior to encounter.   Current Outpatient Prescriptions on File Prior to Encounter  Medication Sig Dispense Refill  . amLODipine (NORVASC) 10 MG tablet Take 1 tablet (10 mg total) by mouth daily. (Patient not taking: Reported on 05/16/2016) 30 tablet 2  . benazepril-hydrochlorthiazide (LOTENSIN HCT) 20-25 MG per tablet Take 1 tablet by mouth daily. (Patient not taking: Reported on 05/16/2016) 90 tablet 1  . norgestimate-ethinyl estradiol (ORTHO-CYCLEN,SPRINTEC,PREVIFEM) 0.25-35 MG-MCG tablet Take 1 tablet by mouth daily. (Patient not taking: Reported on 05/16/2016) 1 Package 1    Objective: BP 175/117 mmHg  Pulse 103   Temp(Src) 98.1 F (36.7 C) (Oral)  Resp 16  SpO2 96%  LMP 10/14/2012 Exam: General: awake, alert, well appearing female, NAD, family at bedside Eyes: EOMI, sclera anicteric, PERRL ENTM: poor dentition, MMM Neck: supple, no masses Cardiovascular: RRR, no murmurs Respiratory: CTAB, normal WOB on room air Abdomen: obese, soft, NT/ND, +BS MSK: WWP, no edema, LE symmetric without TTP Skin: no rashes or lesions Neuro: right hand grip noticeably weaker than left, RUE strength 3/5, LUE 5/5. Bilateral LE strength 5/5.  Patellar DTRs 2/4 bilaterally.  Decreased light touch sensation to right hand and forearm.  Normal light touch throughout rest of body.  CN 2-12 grossly in tact.  Speech somewhat slurred sounding on exam.  Cognition seems to be in tact.  Patient able to follow commands.  Thought process seems appropriate.  No LE cerebellar testing abnormalities.  LUE cerebellar testing normal.  RUE cerebellar testing normal but SLOW to perform Psych: mood stable, goo eye contact, affect appropriate  Labs and Imaging:  Results for orders placed or performed during the hospital encounter of 05/16/16 (  from the past 24 hour(s))  Protime-INR     Status: None   Collection Time: 05/16/16 11:00 AM  Result Value Ref Range   Prothrombin Time 13.9 11.6 - 15.2 seconds   INR 1.05 0.00 - 1.49  APTT     Status: None   Collection Time: 05/16/16 11:00 AM  Result Value Ref Range   aPTT 31 24 - 37 seconds  CBC     Status: Abnormal   Collection Time: 05/16/16 11:00 AM  Result Value Ref Range   WBC 5.9 4.0 - 10.5 K/uL   RBC 5.67 (H) 3.87 - 5.11 MIL/uL   Hemoglobin 15.1 (H) 12.0 - 15.0 g/dL   HCT 16.1 (H) 09.6 - 04.5 %   MCV 81.3 78.0 - 100.0 fL   MCH 26.6 26.0 - 34.0 pg   MCHC 32.8 30.0 - 36.0 g/dL   RDW 40.9 81.1 - 91.4 %   Platelets 256 150 - 400 K/uL  Differential     Status: None   Collection Time: 05/16/16 11:00 AM  Result Value Ref Range   Neutrophils Relative % 53 %   Neutro Abs 3.1 1.7 - 7.7  K/uL   Lymphocytes Relative 36 %   Lymphs Abs 2.1 0.7 - 4.0 K/uL   Monocytes Relative 8 %   Monocytes Absolute 0.4 0.1 - 1.0 K/uL   Eosinophils Relative 3 %   Eosinophils Absolute 0.2 0.0 - 0.7 K/uL   Basophils Relative 0 %   Basophils Absolute 0.0 0.0 - 0.1 K/uL  Comprehensive metabolic panel     Status: Abnormal   Collection Time: 05/16/16 11:00 AM  Result Value Ref Range   Sodium 139 135 - 145 mmol/L   Potassium 3.6 3.5 - 5.1 mmol/L   Chloride 105 101 - 111 mmol/L   CO2 28 22 - 32 mmol/L   Glucose, Bld 118 (H) 65 - 99 mg/dL   BUN 8 6 - 20 mg/dL   Creatinine, Ser 7.82 0.44 - 1.00 mg/dL   Calcium 9.5 8.9 - 95.6 mg/dL   Total Protein 7.5 6.5 - 8.1 g/dL   Albumin 3.8 3.5 - 5.0 g/dL   AST 24 15 - 41 U/L   ALT 22 14 - 54 U/L   Alkaline Phosphatase 105 38 - 126 U/L   Total Bilirubin 1.4 (H) 0.3 - 1.2 mg/dL   GFR calc non Af Amer >60 >60 mL/min   GFR calc Af Amer >60 >60 mL/min   Anion gap 6 5 - 15  I-stat troponin, ED     Status: None   Collection Time: 05/16/16 12:49 PM  Result Value Ref Range   Troponin i, poc 0.01 0.00 - 0.08 ng/mL   Comment 3           Ct Head Wo Contrast  05/16/2016  CLINICAL DATA:  50 year old female with a history of right hand numbness. EXAM: CT HEAD WITHOUT CONTRAST TECHNIQUE: Contiguous axial images were obtained from the base of the skull through the vertex without intravenous contrast. COMPARISON:  None. FINDINGS: Unremarkable appearance of the calvarium without acute fracture or aggressive lesion. Unremarkable appearance of the scalp soft tissues. Unremarkable appearance of the bilateral orbits. Mastoid air cells are clear. No significant paranasal sinus disease No acute intracranial hemorrhage, midline shift, or mass effect. Gray-white differentiation is maintained, without CT evidence of acute ischemia. Unremarkable configuration of the ventricles. Focal hypodensity in the periventricular white matter of the right frontal horn. Focal hypodensity in the  left basal ganglia. IMPRESSION:  No CT evidence of acute intracranial abnormality. Focal hypodensity in the right periventricular white matter overlying the right frontal horn, as well as within the left basal ganglia. These may represent prior episode of infarction or demyelination. Signed, Yvone NeuJaime S. Loreta AveWagner, DO [facility] Electronically Signed   By: Gilmer MorJaime  Wagner D.O.   On: 05/16/2016 12:24   Raliegh IpAshly M Neisha Hinger, DO 05/16/2016, 4:16 PM PGY-3, [facility] FPTS Intern pager: [phone], text pages welcome

## 2016-05-17 ENCOUNTER — Inpatient Hospital Stay (HOSPITAL_COMMUNITY): Payer: Medicaid Other

## 2016-05-17 DIAGNOSIS — I63312 Cerebral infarction due to thrombosis of left middle cerebral artery: Secondary | ICD-10-CM

## 2016-05-17 DIAGNOSIS — I679 Cerebrovascular disease, unspecified: Secondary | ICD-10-CM

## 2016-05-17 DIAGNOSIS — E785 Hyperlipidemia, unspecified: Secondary | ICD-10-CM | POA: Insufficient documentation

## 2016-05-17 LAB — TROPONIN I
TROPONIN I: 0.04 ng/mL — AB (ref <0.03)
TROPONIN I: 0.03 ng/mL — AB (ref <0.03)

## 2016-05-17 MED ORDER — CLOPIDOGREL BISULFATE 75 MG PO TABS
75.0000 mg | ORAL_TABLET | Freq: Every day | ORAL | Status: DC
  Administered 2016-05-17 – 2016-05-20 (×4): 75 mg via ORAL
  Filled 2016-05-17 (×4): qty 1

## 2016-05-17 MED ORDER — ASPIRIN EC 325 MG PO TBEC
325.0000 mg | DELAYED_RELEASE_TABLET | Freq: Every day | ORAL | Status: DC
  Administered 2016-05-18 – 2016-05-20 (×3): 325 mg via ORAL
  Filled 2016-05-17 (×3): qty 1

## 2016-05-17 NOTE — Progress Notes (Signed)
STROKE TEAM PROGRESS NOTE   HISTORY OF PRESENT ILLNESS (per record) Kathleen Larson is an 50 y.o. female with a history of hypertension developed right hand numbness the day prior to admission while getting ready for work at approximately 5:30 in the morning. The patient states that she did not awake up with the symptom. She decided to go on to work and her deficits progressed throughout the day. Today she did not feel well and presented to the emergency department for further evaluation. The patient did not describe weakness although there is RUE weakness on exam. Her deficits are confined to the right upper extremity.   A CT scan of the head revealed no evidence of acute intracranial abnormality. Focal hypodensity in the right periventricular white matter overlying the right frontal horn, as well as within the left basal ganglia. An MRI / MRA is pending.   The patient reported that she did not take her blood pressure medication this a.m. but denied missing any other doses. In the emergency department her blood pressure was significantly elevated and she was placed on a Cleviprex drip. Her last blood pressure was 193/122. She also had sinus tachycardia with rates in the 120 to 130 range. She denied chest pain or shortness of breath. Family members who accompanied the patient reported that her speech did not sound normal but no obvious slurring was detected on exam.  Date last known well: 05/15/2016 Time last known well: 5:30 tPA Given: No TPA due to late presentation.    SUBJECTIVE (INTERVAL HISTORY) No family is at the bedside. She is sitting in chair calling friends. Overall she feels her condition is stable. She still has right UE weakness but stable. She stated that she was on HTN meds before but currently not on it. She denies smoking.   OBJECTIVE Temp:  [97.9 F (36.6 C)-98.4 F (36.9 C)] 98.1 F (36.7 C) (07/09 0341) Pulse Rate:  [58-108] 72 (07/09 0341) Cardiac Rhythm:  [-]  Normal sinus rhythm (07/08 2000) Resp:  [10-18] 14 (07/09 0341) BP: (166-264)/(94-151) 166/103 mmHg (07/09 0341) SpO2:  [96 %-100 %] 99 % (07/09 0341) Weight:  [94.8 kg (208 lb 15.9 oz)] 94.8 kg (208 lb 15.9 oz) (07/08 1619)  CBC:  Recent Labs Lab 05/16/16 1100  WBC 5.9  NEUTROABS 3.1  HGB 15.1*  HCT 46.1*  MCV 81.3  PLT 256    Basic Metabolic Panel:  Recent Labs Lab 05/16/16 1100  NA 139  K 3.6  CL 105  CO2 28  GLUCOSE 118*  BUN 8  CREATININE 0.88  CALCIUM 9.5    Lipid Panel:    Component Value Date/Time   CHOL 277* 05/16/2016 1545   TRIG 115 05/16/2016 1545   HDL 59 05/16/2016 1545   CHOLHDL 4.7 05/16/2016 1545   VLDL 23 05/16/2016 1545   LDLCALC 195* 05/16/2016 1545   HgbA1c: No results found for: HGBA1C Urine Drug Screen:    Component Value Date/Time   LABOPIA NEGATIVE 01/29/2009 0800   COCAINSCRNUR NEGATIVE 01/29/2009 0800   LABBENZ NEGATIVE 01/29/2009 0800   AMPHETMU NEGATIVE 01/29/2009 0800      IMAGING  I have personally reviewed the radiological images below and agree with the radiology interpretations.  Ct Head Wo Contrast 05/16/2016   No CT evidence of acute intracranial abnormality. Focal hypodensity in the right periventricular white matter overlying the right frontal horn, as well as within the left basal ganglia. These may represent prior episode of infarction or demyelination.   Mr  Mra Head/brain Wo Cm 05/16/2016    MRI HEAD  1. 1 cm acute ischemic nonhemorrhagic infarct within the inferior aspect of the posterior limb of the left internal capsule, along the major descending white matter tract.  2. Multiple remote lacunar infarcts involving the bilateral corona radiata, right basal ganglia, left thalamus, and right pons.  3. Mild to moderate chronic small vessel ischemic disease.   MRA HEAD  1. No large or proximal arterial branch occlusion. No correctable stenosis.  2. Extensive atheromatous irregularity with stenoses throughout the  anterior cerebral arteries and posterior cerebral arteries bilaterally.  3. Distal small vessel atheromatous irregularity within the MCA branches bilaterally.  TTE pending  CUS pending   PHYSICAL EXAM  Temp:  [97.8 F (36.6 C)-98.4 F (36.9 C)] 97.8 F (36.6 C) (07/09 0723) Pulse Rate:  [58-108] 67 (07/09 0723) Resp:  [10-18] 16 (07/09 0723) BP: (163-230)/(94-128) 163/105 mmHg (07/09 0723) SpO2:  [96 %-100 %] 97 % (07/09 0723) Weight:  [208 lb 15.9 oz (94.8 kg)] 208 lb 15.9 oz (94.8 kg) (07/08 1619)  General - Well nourished, well developed, in no apparent distress.  Ophthalmologic - Sharp disc margins OU.   Cardiovascular - Regular rate and rhythm.  Mental Status -  Level of arousal and orientation to time, place, and person were intact. Language including expression, naming, repetition, comprehension was assessed and found intact. Fund of Knowledge was assessed and was intact.  Cranial Nerves II - XII - II - Visual field intact OU. III, IV, VI - Extraocular movements intact. V - Facial sensation intact bilaterally. VII - mild right nasolabial fold flattening. VIII - Hearing & vestibular intact bilaterally. X - Palate elevates symmetrically. XI - Chin turning & shoulder shrug intact bilaterally. XII - Tongue protrusion intact.  Motor Strength - The patient's strength was normal in all extremities except right UE 4/5 and pronator drift was present on the right.  Bulk was normal and fasciculations were absent.   Motor Tone - Muscle tone was assessed at the neck and appendages and was normal.  Reflexes - The patient's reflexes were 1+ in all extremities and she had no pathological reflexes.  Sensory - Light touch, temperature/pinprick were assessed and were symmetrical.    Coordination - The patient had normal movements in the left hand, but mild ataxia on the left hand not out of proportion to her weakness.  Tremor was absent.  Gait and Station - deferred as she just  finished PT.   ASSESSMENT/PLAN [name] is a 50 y.o. female with history of hypertension presenting with right arm weakness and numbness as well as dysarthria in the setting of severe hypertension. She did not receive IV t-PA due to late presentation.  Stroke:  Left PLIC infarct likely secondary to small vessel disease.  Resultant  LUE weakness  MRI - acute infarct within the left internal capsule. Multiple remote lacunar infarcts.  MRA - extensive intracranial atherosclerosis   Carotid Doppler - pending  2D Echo pending  LDL - 195  HgbA1c pending  VTE prophylaxis - SCDs  Diet Heart Room service appropriate?: Yes; Fluid consistency:: Thin  No antithrombotic prior to admission, now on aspirin 81 mg daily. Due to extensive intracranial stenosis, recommend dual antiplatelet with ASA 325 and plavix 75 for 3 months and then plavix alone.   Patient counseled to be compliant with her antithrombotic medications  Ongoing aggressive stroke risk factor management  Therapy recommendations: CIR recommended  Disposition: Pending  Pt seems to  be a STROKE AF candidate depending on CUS and TTE reports. Will ask Dr. Pearlean Brownie to proceed tomorrow when CUS and TTE report are available. I have briefly mentioned the trial with her and she has no objection.  Intracranial atherosclerosis  Involving in multiple intracranial vessels  Likely due to chronic uncontrolled HTN and HLD  Recommend dual antiplatelet with ASA 325 and plavix 75 for 3 months and then plavix alone.   Hypertension  Still elevated  Permissive hypertension (OK if < 220/120) but gradually normalize in 5-7 days  Long-term BP goal normotensive  Compliance is further emphasized  Hyperlipidemia  Home meds: No lipid lowering medications prior to admission  LDL 195, goal < 70  Now on Lipitor 80 mg daily  Continue statin at discharge  Other Stroke Risk Factors  ETOH use, advised to drink no more than 1 - 2  drink(s) a day  Obesity, Body mass index is 35.86 kg/(m^2)., recommend weight loss, diet and exercise as appropriate   Hx stroke/TIA (by MRI)  Family hx stroke (mother)  Other Active Problems    Hospital day # 1  Marvel Plan, MD PhD Stroke Neurology 05/17/2016 11:25 AM      To contact Stroke Continuity provider, please refer to [email or web address]. After hours, contact General Neurology

## 2016-05-17 NOTE — Progress Notes (Signed)
Family Medicine Teaching Service Daily Progress Note Intern Pager: [phone]  Patient name: Kathleen Larson Medical record number: [id number] Date of birth: 10/08/1966 Age: 50 y.o. Gender: female  Primary Care Provider: No primary care provider on file. Consultants: Neurology Code Status: FULL (discussed on admission)  Pt Overview and Major Events to Date:  07/08 Admitted  Assessment and Plan: Kathleen Larson is a 50 y.o. female presenting with right hand numbness, weakness and slurred speach . PMH is significant for HTN, HLD, constipation  # Ischemic CVA:  CT head negative for acute processes. MRI/MRA brain: 1 cm acute ischemic nonhemorrhagic infarct within the inferior aspect of the posterior limb of the left internal capsule, along the major descending white matter tract. Multiple remote lacunar infarcts involving the bilateral corona radiata, right basal ganglia, left thalamus, and right pons. Extensive atheromatous irregularity with stenoses throughout the anterior cerebral arteries and posterior cerebral arteries bilaterally. Tot Chol 277, LDL 195, HDL 59.  Trop 0.04>0.03>0.04. EKG improved compared to yesterday and more consistent with LVH - Telemetry - VS per floor protocol  - Neuro c/s in ED, appreciate recs - f/u A1c - repeat BMP in am - repeat EKG in am - Carotid dopplers - c/s PT/ OT/ SLP - Consider formal c/s to cards if persistent EKG changes or marked elevation in troponin. Spoke to on call Cardiologist, Dr Royann Shivers, to review EKG. Feels that changes are most consistent with an LVH picture but notes that it would be reasonable to r/o for MI/ repeat EKG and obtain echo. Appreciate assistance. - Echo ordered - ASA  qd  # Accelerated HTN/ Hypertensive emergency: BP 166/103 this am. Labetalol x1 overnight.  On Lisinopril-HCTZ outpatient but is not compliant - Per neuro: permissive HTN 220/110 in the setting of acute CVA - Transition to PO antihypertensives  when able  # HLD:  - Lipitor  daily  FEN/GI: NPO until assessed by SLP/ RN bedside Prophylaxis: SCDs for now  Disposition: Discharge pending PT/Neuro recs.  Consider CIR vs SNF vs HH for rehab.  Subjective:  Patient reports that she is feeling fine this am.  She continues to have right hand and forearm numbness/ tingling/ weakness.  She notes that she worked with PT today.  PT notes that patient had issues with balance after ambulating several feet.  She also had difficulty getting up stairs but was able to do so without assistance.    Objective: Temp:  [97.9 F (36.6 C)-98.4 F (36.9 C)] 98.1 F (36.7 C) (07/09 0341) Pulse Rate:  [58-108] 72 (07/09 0341) Resp:  [10-18] 14 (07/09 0341) BP: (166-264)/(94-151) 166/103 mmHg (07/09 0341) SpO2:  [96 %-100 %] 99 % (07/09 0341) Weight:  [208 lb 15.9 oz (94.8 kg)] 208 lb 15.9 oz (94.8 kg) (07/08 1619) Physical Exam: General: awake, alert, NAD, sitting up bedside eating breakfast Cardiovascular: RRR, no murmurs, +2DP Respiratory: CTAB, normal WOB on room air Abdomen: obese, soft, NT/ND, +BS Extremities: WWP, no edema Neuro: decreased light touch sensation to RUE, grip strength decreased in right hand compared to left, 3/5 external rotator, 3/5 extensors, 3/5 flexors of RUE, slightly decreased strength with dorsiflexion of right foot but still 5/5.  Follows commands.  Speech still slurred.    Laboratory:  Recent Labs Lab 05/16/16 1100  WBC 5.9  HGB 15.1*  HCT 46.1*  PLT 256    Recent Labs Lab 05/16/16 1100  NA 139  K 3.6  CL 105  CO2 28  BUN 8  CREATININE 0.88  CALCIUM 9.5  PROT 7.5  BILITOT 1.4*  ALKPHOS 105  ALT 22  AST 24  GLUCOSE 118*   Lipid Panel     Component Value Date/Time   CHOL 277* 05/16/2016 1545   TRIG 115 05/16/2016 1545   HDL 59 05/16/2016 1545   CHOLHDL 4.7 05/16/2016 1545   VLDL 23 05/16/2016 1545   LDLCALC 195* 05/16/2016 1545   Cardiac Panel (last 3 results)  Recent Labs   05/16/16 1546 05/16/16 2224  TROPONINI 0.04* 0.03*   TSH 1.381 A1c pending  Imaging/Diagnostic Tests: Ct Head Wo Contrast  05/16/2016  CLINICAL DATA:  50 year old female with a history of right hand numbness. EXAM: CT HEAD WITHOUT CONTRAST TECHNIQUE: Contiguous axial images were obtained from the base of the skull through the vertex without intravenous contrast. COMPARISON:  None. FINDINGS: Unremarkable appearance of the calvarium without acute fracture or aggressive lesion. Unremarkable appearance of the scalp soft tissues. Unremarkable appearance of the bilateral orbits. Mastoid air cells are clear. No significant paranasal sinus disease No acute intracranial hemorrhage, midline shift, or mass effect. Gray-white differentiation is maintained, without CT evidence of acute ischemia. Unremarkable configuration of the ventricles. Focal hypodensity in the periventricular white matter of the right frontal horn. Focal hypodensity in the left basal ganglia. IMPRESSION: No CT evidence of acute intracranial abnormality. Focal hypodensity in the right periventricular white matter overlying the right frontal horn, as well as within the left basal ganglia. These may represent prior episode of infarction or demyelination. Signed, Yvone Neu. Loreta Ave, DO [facility] Radiology Electronically Signed   By: Gilmer Mor D.O.   On: 05/16/2016 12:24   Mr Brain Wo Contrast  05/16/2016  CLINICAL DATA:  Initial evaluation for right hand numbness with slurred speech. EXAM: MRI HEAD WITHOUT CONTRAST MRA HEAD WITHOUT CONTRAST TECHNIQUE: Multiplanar, multiecho pulse sequences of the brain and surrounding structures were obtained without intravenous contrast. Angiographic images of the head were obtained using MRA technique without contrast. COMPARISON:  Prior CT from earlier the same day. FINDINGS: MRI HEAD FINDINGS Study somewhat limited by susceptibility artifact. Cerebral volume  within normal limits for patient age. Patchy T2/FLAIR hyperintensity within the periventricular and deep white matter both cerebral hemispheres most consistent with chronic small vessel ischemic disease, mild to moderate in nature. Remote lacunar infarcts within the bilateral corona radiata, right basal ganglia, left thalamus, and right pons. Small amount of associated chronic blood products with the remote right pontine infarct. 1 cm focus of restricted diffusion positioned at the inferior aspect of the posterior limb of the left internal capsule along the major descending white matter tract (series 4, image 27). No associated hemorrhage or mass effect. No other evidence for acute infarct. Gray-white matter differentiation otherwise maintained. Major intracranial vascular flow voids are preserved. No acute intracranial hemorrhage. No mass lesion, midline shift, or mass effect. No hydrocephalus. No extra-axial fluid collection. Serpiginous FLAIR signal abnormality overlying multiple cortical sulci within the bilateral frontal regions consistent with artifact. Major dural sinuses are grossly patent. Craniocervical junction normal. Visualized upper cervical spine unremarkable. Pituitary gland normal.  Globes and orbits within normal limits. Paranasal sinuses are clear. No mastoid effusion. Inner ear structures normal. Bone marrow signal intensity within normal limits. Susceptibility artifact overlies the scalp. No definite scalp soft tissue abnormality. MRA HEAD FINDINGS ANTERIOR CIRCULATION: Visualized distal cervical segments of the internal carotid arteries are patent with antegrade flow. Petrous, cavernous, and supraclinoid segments widely patent bilaterally without flow-limiting stenosis. A1 segments patent. Anterior  communicating artery normal. Anterior cerebral arteries opacified to their distal aspects but demonstrate multi focal atheromatous irregularity, right slightly worse than left. In particular, there  is a severe focal stenosis within the right A2 segment (series 7, image 1 await). Superimposed multifocal stenoses present. M1 segments patent without stenosis or occlusion. MCA bifurcations normal. Short-segment moderate left M2 stenosis (series 704, image 4). Atheromatous irregularity within the distal MCA branches bilaterally. POSTERIOR CIRCULATION: Vertebral arteries patent to the vertebrobasilar junction. Posterior inferior cerebral arteries patent, with the left PICA demonstrating multifocal atheromatous irregularity. Basilar artery widely patent. Superior cerebellar arteries patent bilaterally. Both of the posterior cerebral arteries arise from the basilar artery. Extensive multi focal atheromatous irregularity with moderate to severe multi focal stenoses present within the PCAs bilaterally. Changes most severe within the mid left P 2 segment wearing a focal severe stenosis is present (series 706, image 11). PCAs are opacified to their distal aspect. No aneurysm or vascular malformation. IMPRESSION: MRI HEAD IMPRESSION: 1. 1 cm acute ischemic nonhemorrhagic infarct within the inferior aspect of the posterior limb of the left internal capsule, along the major descending white matter tract. 2. Multiple remote lacunar infarcts involving the bilateral corona radiata, right basal ganglia, left thalamus, and right pons. 3. Mild to moderate chronic small vessel ischemic disease. MRA HEAD IMPRESSION: 1. No large or proximal arterial branch occlusion. No correctable stenosis. 2. Extensive atheromatous irregularity with stenoses throughout the anterior cerebral arteries and posterior cerebral arteries bilaterally. 3. Distal small vessel atheromatous irregularity within the MCA branches bilaterally. Electronically Signed   By: Rise Mu M.D.   On: 05/16/2016 20:04   Mr Maxine Glenn Head/brain Wo Cm  05/16/2016  CLINICAL DATA:  Initial evaluation for right hand numbness with slurred speech. EXAM: MRI HEAD WITHOUT  CONTRAST MRA HEAD WITHOUT CONTRAST TECHNIQUE: Multiplanar, multiecho pulse sequences of the brain and surrounding structures were obtained without intravenous contrast. Angiographic images of the head were obtained using MRA technique without contrast. COMPARISON:  Prior CT from earlier the same day. FINDINGS: MRI HEAD FINDINGS Study somewhat limited by susceptibility artifact. Cerebral volume within normal limits for patient age. Patchy T2/FLAIR hyperintensity within the periventricular and deep white matter both cerebral hemispheres most consistent with chronic small vessel ischemic disease, mild to moderate in nature. Remote lacunar infarcts within the bilateral corona radiata, right basal ganglia, left thalamus, and right pons. Small amount of associated chronic blood products with the remote right pontine infarct. 1 cm focus of restricted diffusion positioned at the inferior aspect of the posterior limb of the left internal capsule along the major descending white matter tract (series 4, image 27). No associated hemorrhage or mass effect. No other evidence for acute infarct. Gray-white matter differentiation otherwise maintained. Major intracranial vascular flow voids are preserved. No acute intracranial hemorrhage. No mass lesion, midline shift, or mass effect. No hydrocephalus. No extra-axial fluid collection. Serpiginous FLAIR signal abnormality overlying multiple cortical sulci within the bilateral frontal regions consistent with artifact. Major dural sinuses are grossly patent. Craniocervical junction normal. Visualized upper cervical spine unremarkable. Pituitary gland normal.  Globes and orbits within normal limits. Paranasal sinuses are clear. No mastoid effusion. Inner ear structures normal. Bone marrow signal intensity within normal limits. Susceptibility artifact overlies the scalp. No definite scalp soft tissue abnormality. MRA HEAD FINDINGS ANTERIOR CIRCULATION: Visualized distal cervical segments  of the internal carotid arteries are patent with antegrade flow. Petrous, cavernous, and supraclinoid segments widely patent bilaterally without flow-limiting stenosis. A1 segments patent. Anterior communicating artery normal. Anterior  cerebral arteries opacified to their distal aspects but demonstrate multi focal atheromatous irregularity, right slightly worse than left. In particular, there is a severe focal stenosis within the right A2 segment (series 7, image 1 await). Superimposed multifocal stenoses present. M1 segments patent without stenosis or occlusion. MCA bifurcations normal. Short-segment moderate left M2 stenosis (series 704, image 4). Atheromatous irregularity within the distal MCA branches bilaterally. POSTERIOR CIRCULATION: Vertebral arteries patent to the vertebrobasilar junction. Posterior inferior cerebral arteries patent, with the left PICA demonstrating multifocal atheromatous irregularity. Basilar artery widely patent. Superior cerebellar arteries patent bilaterally. Both of the posterior cerebral arteries arise from the basilar artery. Extensive multi focal atheromatous irregularity with moderate to severe multi focal stenoses present within the PCAs bilaterally. Changes most severe within the mid left P 2 segment wearing a focal severe stenosis is present (series 706, image 11). PCAs are opacified to their distal aspect. No aneurysm or vascular malformation. IMPRESSION: MRI HEAD IMPRESSION: 1. 1 cm acute ischemic nonhemorrhagic infarct within the inferior aspect of the posterior limb of the left internal capsule, along the major descending white matter tract. 2. Multiple remote lacunar infarcts involving the bilateral corona radiata, right basal ganglia, left thalamus, and right pons. 3. Mild to moderate chronic small vessel ischemic disease. MRA HEAD IMPRESSION: 1. No large or proximal arterial branch occlusion. No correctable stenosis. 2. Extensive atheromatous irregularity with stenoses  throughout the anterior cerebral arteries and posterior cerebral arteries bilaterally. 3. Distal small vessel atheromatous irregularity within the MCA branches bilaterally. Electronically Signed   By: Rise MuBenjamin  McClintock M.D.   On: 05/16/2016 20:04    Raliegh IpAshly M Gottschalk, DO 05/17/2016, 7:12 AM PGY-3, [facility] FPTS Intern pager: [phone], text pages welcome

## 2016-05-17 NOTE — Progress Notes (Signed)
Inpatient Rehabilitation  Patient was screened by Weldon PickingSusan Ida Milbrath for appropriateness for an Inpatient Acute Rehab consult at the request of PT.  OT consult is pending and is not scheduled for today.  At this time, we are recommending Inpatient Rehab consult.  Please order consult if you are agreeable.  Weldon PickingSusan Prynce Jacober PT Inpatient Rehab Admissions Coordinator Cell [phone] Office [phone]

## 2016-05-17 NOTE — Evaluation (Signed)
Physical Therapy Evaluation Patient Details Name: Kathleen Larson MRN: [id number] DOB: 1966-02-25 Today's Date: 05/17/2016   History of Present Illness  Kathleen PearlConstance M Larson is a 50 y.o. female presenting with right hand numbness, weakness and slurred speach . PMH is significant for HTN, HLD, constipation. 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 Pt currently with functional limitations due to the deficits listed below (see PT Problem List). Pt with right sided weakness, slurred speech, and balance deficits, scored 10 on DGI indicating significant fall risk. Would greatly benefit from short CIR stay.  Pt will benefit from skilled PT to increase their independence and safety with mobility to allow discharge to the venue listed below.       Follow Up Recommendations CIR    Equipment Recommendations  None recommended by PT    Recommendations for Other Services OT consult;Rehab consult;Speech consult     Precautions / Restrictions Precautions Precautions: Fall Restrictions Weight Bearing Restrictions: No      Mobility  Bed Mobility Overal bed mobility: Needs Assistance Bed Mobility: Supine to Sit     Supine to sit: Supervision     General bed mobility comments: pt able to get to EOB with increased effort and use of of rail but no physical assist  Transfers Overall transfer level: Needs assistance   Transfers: Sit to/from Stand Sit to Stand: Min guard         General transfer comment: pt unsteady with initial standing and cannot control her sit all the way to the chair, min-guard given for safety. Slight left lean noted  Ambulation/Gait Ambulation/Gait assistance: Min assist Ambulation Distance (Feet): 300 Feet   Gait Pattern/deviations: Step-through pattern;Staggering right Gait velocity: decreased Gait velocity interpretation: Below normal speed for age/gender General  Gait Details: guarded gait with decreased ability to change pace, one LOB to right with turning, widened stance  Stairs Stairs: Yes Stairs assistance: Min assist Stair Management: One rail Left;Step to pattern;Forwards Number of Stairs: 3 General stair comments: pt had great difficulty with right ascension and left descension, avoided all together until therapist made her try, heavy reliance on rail.   Wheelchair Mobility    Modified Rankin (Stroke Patients Only) Modified Rankin (Stroke Patients Only) Pre-Morbid Rankin Score: No symptoms Modified Rankin: Moderately severe disability     Balance Overall balance assessment: Needs assistance Sitting-balance support: No upper extremity supported Sitting balance-Leahy Scale: Fair Sitting balance - Comments: difficulty gaining balance initially   Standing balance support: No upper extremity supported Standing balance-Leahy Scale: Fair                   Standardized Balance Assessment Standardized Balance Assessment : Dynamic Gait Index   Dynamic Gait Index Level Surface: Moderate Impairment Change in Gait Speed: Moderate Impairment Gait with Horizontal Head Turns: Moderate Impairment Gait with Vertical Head Turns: Moderate Impairment Gait and Pivot Turn: Mild Impairment Step Over Obstacle: Moderate Impairment Step Around Obstacles: Mild Impairment Steps: Moderate Impairment Total Score: 10       Pertinent Vitals/Pain Pain Assessment: No/denies pain         See comments  Home Living Family/patient expects to be discharged to:: Private residence Living Arrangements: Alone Available Help at Discharge: Family;Available 24 hours/day Type of Home: Apartment Home Access: Stairs to enter Entrance Stairs-Rails: None Entrance Stairs-Number of Steps: 1   Home Equipment: None Additional Comments: pt works at [employer], lots of lifting  Prior Function Level of Independence: Independent         Comments: pt reports  that daughter can stay with her at night and sister can be with her in the day and take her to appts      Hand Dominance   Dominant Hand: Right    Extremity/Trunk Assessment   Upper Extremity Assessment: Defer to OT evaluation           Lower Extremity Assessment: RLE deficits/detail;LLE deficits/detail RLE Deficits / Details: knee ext 4/5, knee flex 4/5, hip flex 4/5, ankle WFL. Able to break contraction on right, pt very strong at baseline, unable to break conrtaction on left LLE Deficits / Details: 5/5 strength throughout   Cervical / Trunk Assessment: Normal  Communication   Communication: Expressive difficulties (slurring)  Cognition Arousal/Alertness: Awake/alert Behavior During Therapy: WFL for tasks assessed/performed Overall Cognitive Status: Impaired/Different from baseline Area of Impairment: Safety/judgement         Safety/Judgement: Decreased awareness of deficits (seems more like denial than impairment)     General Comments: pt states that right LE feels the same as left and that her balance feels normal but then when deficits are pointed out, she agrees that she is having trouble    General Comments General comments (skin integrity, edema, etc.): BP 165/109  HR 85 bpm O2 sats 97%    Exercises        Assessment/Plan    PT Assessment Patient needs continued PT services  PT Diagnosis Difficulty walking;Abnormality of gait;Hemiplegia dominant side   PT Problem List Decreased strength;Decreased balance;Decreased mobility;Decreased coordination;Decreased safety awareness;Decreased knowledge of precautions;Impaired sensation  PT Treatment Interventions Stair training;Functional mobility training;Therapeutic activities;Gait training;Therapeutic exercise;Balance training;Neuromuscular re-education;Patient/family education   PT Goals (Current goals can be found in the Care Plan section) Acute Rehab PT Goals Patient Stated Goal: retrun to normal PT Goal  Formulation: With patient Time For Goal Achievement: 05/31/16 Potential to Achieve Goals: Good    Frequency Min 4X/week   Barriers to discharge        Co-evaluation               End of Session Equipment Utilized During Treatment: Gait belt Activity Tolerance: Patient tolerated treatment well Patient left: in chair;with chair alarm set;with call bell/phone within reach Nurse Communication: Mobility status         Time: 4098-1191 PT Time Calculation (min) (ACUTE ONLY): 23 min   Charges:   PT Evaluation $PT Eval Moderate Complexity: 1 Procedure PT Treatments $Gait Training: 8-22 mins   PT G Codes:       Lyanne Co, PT  Acute Rehab Services  [phone]  Lyanne Co 05/17/2016, 8:52 AM

## 2016-05-17 NOTE — Progress Notes (Addendum)
SLP Cancellation Note  Patient Details Name: Kathleen PearlConstance M Sobczyk MRN: [id number] DOB: 09/03/1966   Cancelled treatment:       Reason Eval/Treat Not Completed: SLP screened, no needs identified, will sign off. Pt passed the stroke swallow screen, but SLP swallow eval ordered in addition. MD note states "FEN/GI: NPO until assessed by SLP/ RN bedside" Given that pt was administered the RN stroke swallow, passed and is on a diet, further assessment is not warranted. Please reorder if pt does not tolerate diet. Will f/u for cognitive linguistic eval as ordered.    Castin Donaghue, Riley NearingBonnie Caroline 05/17/2016, 11:56 AM

## 2016-05-18 ENCOUNTER — Inpatient Hospital Stay (HOSPITAL_COMMUNITY): Payer: Medicaid Other

## 2016-05-18 DIAGNOSIS — I639 Cerebral infarction, unspecified: Secondary | ICD-10-CM

## 2016-05-18 DIAGNOSIS — R29898 Other symptoms and signs involving the musculoskeletal system: Secondary | ICD-10-CM | POA: Insufficient documentation

## 2016-05-18 LAB — CBC
HCT: 42.3 % (ref 36.0–46.0)
Hemoglobin: 13.8 g/dL (ref 12.0–15.0)
MCH: 26.5 pg (ref 26.0–34.0)
MCHC: 32.6 g/dL (ref 30.0–36.0)
MCV: 81.3 fL (ref 78.0–100.0)
PLATELETS: 229 10*3/uL (ref 150–400)
RBC: 5.2 MIL/uL — AB (ref 3.87–5.11)
RDW: 14.8 % (ref 11.5–15.5)
WBC: 6.4 10*3/uL (ref 4.0–10.5)

## 2016-05-18 LAB — ECHOCARDIOGRAM COMPLETE
HEIGHTINCHES: 64 in
WEIGHTICAEL: 3343.94 [oz_av]

## 2016-05-18 LAB — HEMOGLOBIN A1C
HEMOGLOBIN A1C: 5.9 % — AB (ref 4.8–5.6)
Mean Plasma Glucose: 123 mg/dL

## 2016-05-18 LAB — BASIC METABOLIC PANEL
Anion gap: 7 (ref 5–15)
BUN: 12 mg/dL (ref 6–20)
CALCIUM: 9 mg/dL (ref 8.9–10.3)
CHLORIDE: 105 mmol/L (ref 101–111)
CO2: 26 mmol/L (ref 22–32)
CREATININE: 0.91 mg/dL (ref 0.44–1.00)
Glucose, Bld: 140 mg/dL — ABNORMAL HIGH (ref 65–99)
Potassium: 3 mmol/L — ABNORMAL LOW (ref 3.5–5.1)
Sodium: 138 mmol/L (ref 135–145)

## 2016-05-18 LAB — MAGNESIUM: MAGNESIUM: 1.9 mg/dL (ref 1.7–2.4)

## 2016-05-18 MED ORDER — LISINOPRIL 10 MG PO TABS
10.0000 mg | ORAL_TABLET | Freq: Every day | ORAL | Status: DC
  Administered 2016-05-18 – 2016-05-20 (×3): 10 mg via ORAL
  Filled 2016-05-18 (×3): qty 1

## 2016-05-18 MED ORDER — HYDROCHLOROTHIAZIDE 12.5 MG PO CAPS
12.5000 mg | ORAL_CAPSULE | Freq: Every day | ORAL | Status: DC
  Administered 2016-05-18 – 2016-05-20 (×3): 12.5 mg via ORAL
  Filled 2016-05-18 (×3): qty 1

## 2016-05-18 MED ORDER — LISINOPRIL-HYDROCHLOROTHIAZIDE 10-12.5 MG PO TABS: 1.0000 | ORAL_TABLET | Freq: Every day | ORAL | Status: DC

## 2016-05-18 MED ORDER — POTASSIUM CHLORIDE CRYS ER 20 MEQ PO TBCR
40.0000 meq | EXTENDED_RELEASE_TABLET | Freq: Two times a day (BID) | ORAL | Status: DC
  Administered 2016-05-18 – 2016-05-20 (×5): 40 meq via ORAL
  Filled 2016-05-18 (×5): qty 2

## 2016-05-18 NOTE — Progress Notes (Signed)
Physical Therapy Treatment Patient Details Name: Kathleen Larson MRN: [id number] DOB: 07/02/1966 Today's Date: 05/18/2016    History of Present Illness Kathleen Larson is a 50 y.o. female presenting with right hand numbness, weakness and slurred speach . PMH is significant for HTN, HLD, constipation. 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 DGI was performed. Pt's score increased from a 10 to a 14. She avoided turning her head to the L during gait due to dizzyness.  Follow Up Recommendations  HHPT     Equipment Recommendations  None recommended by PT    Recommendations for Other Services OT consult;Rehab consult;Speech consult     Precautions / Restrictions Precautions Precautions: Fall Restrictions Weight Bearing Restrictions: No    Mobility  Bed Mobility Overal bed mobility: Modified Independent Bed Mobility: Supine to Sit     Supine to sit: Modified independent (Device/Increase time) (Used hand rail for assistance)     Transfers Overall transfer level: Modified independent (use bed rail for assistance) Transfers: Sit to/from Stand Sit to Stand: Modified independent (Device/Increase time) (took two attempts to rise from sitting to standing)     Ambulation/Gait Ambulation/Gait assistance: Min guard Ambulation Distance (Feet): 500 Feet   Gait Pattern/deviations: WFL(Within Functional Limits) Gait velocity: decreased Gait velocity interpretation: Below normal speed for age/gender     Stairs Stairs: Yes Stairs assistance: Min guard Stair Management: One rail Right;One rail Left;Alternating pattern;Step to pattern Number of Stairs: 3 General stair comments: Pt. decended the stairs with a step to pattern with hand on the L rail for support. She ascended with more dificulty but did use a reciprocal gait pattern.      Balance Overall balance assessment: Modified Independent Sitting-balance support: Feet unsupported;No upper extremity supported Sitting balance-Leahy Scale: Fair (pts balance was not channenged during this treatment)     Standing balance support: No upper extremity supported Standing balance-Leahy Scale: Good                      Cognition Arousal/Alertness: Awake/alert Behavior During Therapy: WFL for tasks assessed/performed              General Comments General comments (skin integrity, edema, etc.): Transport arrived during treatment to transfer patient to procrdure. Only DGI was performed before the patient had to leave      Pertinent Vitals/Pain Pain Assessment: No/denies pain           PT Goals (current goals can now be found in the care plan section) Acute Rehab PT Goals Patient Stated Goal: to back to normal life PT Goal Formulation: With patient Time For Goal Achievement: 05/31/16 Potential to Achieve Goals: Good Progress towards PT goals: Progressing toward goals;Goals met and updated - see care plan    Frequency  Min 4X/week    PT Plan Current plan remains appropriate       End of Session Equipment Utilized During Treatment: Gait belt Activity Tolerance: Patient tolerated treatment well Patient left: Other (comment) (In wheelchair to be transported to procedure)     Time: 4098-1191 PT Time Calculation (min) (ACUTE ONLY): 18 min  Charges:                       G CodesBenjiman Core, Dayle Points [phone]  05/18/2016, 4:14 PM

## 2016-05-18 NOTE — Evaluation (Signed)
Speech Language Pathology Evaluation Patient Details Name: Kathleen PearlConstance M Jacquez MRN: [id number] DOB: 25-Mar-1966 Today's Date: 05/18/2016 Time: [id number] SLP Time Calculation (min) (ACUTE ONLY): 27 min  Problem List:  Patient Active Problem List   Diagnosis Date Noted  . RUE weakness   . Hyperlipidemia   . Intracranial vascular stenosis   . Stroke (HCC) 05/16/2016  . Accelerated hypertension 05/16/2016  . Hypertensive emergency 05/16/2016  . Stroke (cerebrum) (HCC) 05/16/2016  . Cerebrovascular accident (CVA) (HCC)   . Essential hypertension   . Obesity   . Abnormal EKG   . Hypertension 09/17/2012  . FATIGUE 01/06/2010  . CANDIDIASIS, VAGINAL 03/22/2009  . CONSTIPATION 03/22/2009  . HELICOBACTER PYLORI INFECTION, HX OF 01/28/2009  . ALLERGIC RHINITIS 03/02/2008  . ABDOMINAL PAIN 03/02/2008  . HYPERLIPIDEMIA 07/18/2007  . HYPERTENSION 07/12/2007   Past Medical History:  Past Medical History  Diagnosis Date  . Allergy   . Hypertension   . GERD (gastroesophageal reflux disease)    Past Surgical History:  Past Surgical History  Procedure Laterality Date  . Tubal ligation     HPI:  50 y.o. female presenting with right hand numbness, weakness and slurred speach . PMH is significant for HTN, HLD, constipation. 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 She has suspected motor planning difficulties versus phonemic paraphasias during confrontational naming tasks. Max cues provided by SLP for correction with errors noted even after model is provided. Repetition is also impaired at the sentence level due to omission of words. At baseline she is independent, living alone and working full time. Recommend CIR-level therapy for  f/u to maximize cognitive-lingusitic recovery.    SLP Assessment  Patient needs continued Speech Lanaguage Pathology Services    Follow Up Recommendations  Inpatient Rehab;24 hour supervision/assistance    Frequency and Duration min 2x/week  2 weeks      SLP Evaluation Prior Functioning  Cognitive/Linguistic Baseline: Within functional limits  Lives With: Alone Available Help at Discharge: Family;Available 24 hours/day Vocation: Full time employment   Cognition  Overall Cognitive Status: Impaired/Different from baseline Arousal/Alertness: Awake/alert Orientation Level: Oriented X4 Attention: Selective Selective Attention: Impaired Selective Attention Impairment: Verbal basic Memory: Impaired Memory Impairment: Other (comment) (working memory) Awareness: Impaired Awareness Impairment: Intellectual impairment;Emergent impairment;Anticipatory impairment Problem Solving: Impaired Problem Solving Impairment: Functional basic Safety/Judgment: Impaired    Comprehension  Auditory Comprehension Overall Auditory Comprehension: Impaired Commands: Impaired Complex Commands: 50-74% accurate    Expression Expression Primary Mode of Expression: Verbal Verbal Expression Overall Verbal Expression: Impaired Initiation: No impairment Level of Generative/Spontaneous Verbalization: Conversation Repetition: Impaired Level of Impairment: Sentence level Naming: Impairment Confrontation: Impaired Divergent: 50-74% accurate Verbal Errors: Phonemic paraphasias Non-Verbal Means of Communication: Not applicable   Oral / Motor  Motor Speech Overall Motor Speech: Impaired Phonation: Normal Resonance: Hypernasality (? mild) Articulation: Impaired Level of Impairment: Conversation Intelligibility: Intelligible   GO                    Maxcine HamLaura Paiewonsky, M.A. CCC-SLP [phone]  Maxcine Hamaiewonsky, Cherese Lozano 05/18/2016, 11:10 AM

## 2016-05-18 NOTE — Progress Notes (Signed)
Thank you for consult on Ms. Kathleen Larson. Chart reviewed and PT/ST evaluations noted. She was min-guard assist for transfers and is ambulating 300' without AD on evaluation. No ST needed. Anticipate that patient should progress to supervision-modified independent level with therapy on acute. Will defer CIR consult for now

## 2016-05-18 NOTE — Progress Notes (Signed)
STROKE TEAM PROGRESS NOTE   HISTORY OF PRESENT ILLNESS (per record) Kathleen Larson is an 50 y.o. female with a history of hypertension developed right hand numbness the day prior to admission while getting ready for work at approximately 5:30 in the morning. The patient states that she did not awake up with the symptom. She decided to go on to work and her deficits progressed throughout the day. Today she did not feel well and presented to the emergency department for further evaluation. The patient did not describe weakness although there is RUE weakness on exam. Her deficits are confined to the right upper extremity.   A CT scan of the head revealed no evidence of acute intracranial abnormality. Focal hypodensity in the right periventricular white matter overlying the right frontal horn, as well as within the left basal ganglia. An MRI / MRA is pending.   The patient reported that she did not take her blood pressure medication this a.m. but denied missing any other doses. In the emergency department her blood pressure was significantly elevated and she was placed on a Cleviprex drip. Her last blood pressure was 193/122. She also had sinus tachycardia with rates in the 120 to 130 range. She denied chest pain or shortness of breath. Family members who accompanied the patient reported that her speech did not sound normal but no obvious slurring was detected on exam.  Date last known well: 05/15/2016 Time last known well: 5:30 tPA Given: No TPA due to late presentation.    SUBJECTIVE (INTERVAL HISTORY) Her sister is at the bedside.   Overall she feels her condition is stable. She still has right UE weakness but stable. She stated that she was on HTN meds before but currently not on it.    OBJECTIVE Temp:  [97.8 F (36.6 C)-99.1 F (37.3 C)] 98.1 F (36.7 C) (07/10 1100) Pulse Rate:  [62-82] 82 (07/10 1200) Cardiac Rhythm:  [-] Normal sinus rhythm (07/10 0800) Resp:  [10-26] 18 (07/10  1200) BP: (153-215)/(74-142) 211/119 mmHg (07/10 1332) SpO2:  [95 %-100 %] 100 % (07/10 1200)  CBC:   Recent Labs Lab 05/16/16 1100 05/18/16 0528  WBC 5.9 6.4  NEUTROABS 3.1  --   HGB 15.1* 13.8  HCT 46.1* 42.3  MCV 81.3 81.3  PLT 256 229    Basic Metabolic Panel:   Recent Labs Lab 05/16/16 1100 05/18/16 0528  NA 139 138  K 3.6 3.0*  CL 105 105  CO2 28 26  GLUCOSE 118* 140*  BUN 8 12  CREATININE 0.88 0.91  CALCIUM 9.5 9.0  MG  --  1.9    Lipid Panel:     Component Value Date/Time   CHOL 277* 05/16/2016 1545   TRIG 115 05/16/2016 1545   HDL 59 05/16/2016 1545   CHOLHDL 4.7 05/16/2016 1545   VLDL 23 05/16/2016 1545   LDLCALC 195* 05/16/2016 1545   HgbA1c:  Lab Results  Component Value Date   HGBA1C 5.9* 05/16/2016   Urine Drug Screen:     Component Value Date/Time   LABOPIA NEGATIVE 01/29/2009 0800   COCAINSCRNUR NEGATIVE 01/29/2009 0800   LABBENZ NEGATIVE 01/29/2009 0800   AMPHETMU NEGATIVE 01/29/2009 0800      IMAGING  I have personally reviewed the radiological images below and agree with the radiology interpretations.  Ct Head Wo Contrast 05/16/2016   No CT evidence of acute intracranial abnormality. Focal hypodensity in the right periventricular white matter overlying the right frontal horn, as well as within  the left basal ganglia. These may represent prior episode of infarction or demyelination.   Mr Maxine GlennMra Head/brain Wo Cm 05/16/2016    MRI HEAD  1. 1 cm acute ischemic nonhemorrhagic infarct within the inferior aspect of the posterior limb of the left internal capsule, along the major descending white matter tract.  2. Multiple remote lacunar infarcts involving the bilateral corona radiata, right basal ganglia, left thalamus, and right pons.  3. Mild to moderate chronic small vessel ischemic disease.   MRA HEAD  1. No large or proximal arterial branch occlusion. No correctable stenosis.  2. Extensive atheromatous irregularity with stenoses  throughout the anterior cerebral arteries and posterior cerebral arteries bilaterally.  3. Distal small vessel atheromatous irregularity within the MCA branches bilaterally.  TTE pending  CUS pending   PHYSICAL EXAM  Temp:  [97.8 F (36.6 C)-99.1 F (37.3 C)] 98.1 F (36.7 C) (07/10 1100) Pulse Rate:  [62-82] 82 (07/10 1200) Resp:  [10-26] 18 (07/10 1200) BP: (153-215)/(74-142) 211/119 mmHg (07/10 1332) SpO2:  [95 %-100 %] 100 % (07/10 1200)  General - Well nourished, well developed, in no apparent distress.  Ophthalmologic - Sharp disc margins OU.   Cardiovascular - Regular rate and rhythm.  Mental Status -  Level of arousal and orientation to time, place, and person were intact. Language including expression, naming, repetition, comprehension was assessed and found intact. Fund of Knowledge was assessed and was intact.  Cranial Nerves II - XII - II - Visual field intact OU. III, IV, VI - Extraocular movements intact. V - Facial sensation intact bilaterally. VII - mild right nasolabial fold flattening. VIII - Hearing & vestibular intact bilaterally. X - Palate elevates symmetrically. XI - Chin turning & shoulder shrug intact bilaterally. XII - Tongue protrusion intact.  Motor Strength - The patient's strength was normal in all extremities except right UE 4/5 and pronator drift was present on the right.  Bulk was normal and fasciculations were absent.   Motor Tone - Muscle tone was assessed at the neck and appendages and was normal.  Reflexes - The patient's reflexes were 1+ in all extremities and she had no pathological reflexes.  Sensory - Light touch, temperature/pinprick were assessed and were symmetrical.    Coordination - The patient had normal movements in the left hand, but mild ataxia on the left hand not out of proportion to her weakness.  Tremor was absent.  Gait and Station - deferred as she just finished PT.   ASSESSMENT/PLAN [name]  is a 50 y.o. female with history of hypertension presenting with right arm weakness and numbness as well as dysarthria in the setting of severe hypertension. She did not receive IV t-PA due to late presentation.  Stroke:  Left PLIC infarct likely secondary to small vessel disease.  Resultant  LUE weakness  MRI - acute infarct within the left internal capsule. Multiple remote lacunar infarcts.  MRA - extensive intracranial atherosclerosis   Carotid Doppler - pending  2D Echo pending  LDL - 195  HgbA1c 5.9  VTE prophylaxis - SCDs Diet Heart Room service appropriate?: Yes; Fluid consistency:: Thin  No antithrombotic prior to admission, now on aspirin 81 mg daily. Due to extensive intracranial stenosis, recommend dual antiplatelet with ASA 325 and plavix 75 for 3 months and then plavix alone.   Patient counseled to be compliant with her antithrombotic medications  Ongoing aggressive stroke risk factor management  Therapy recommendations: CIR recommended  Disposition: Pending Pt declined particpation in STROKE  AF  Trial. Intracranial atherosclerosis  Involving in multiple intracranial vessels  Likely due to chronic uncontrolled HTN and HLD  Recommend dual antiplatelet with ASA 325 and plavix 75 for 3 months and then plavix alone.   Hypertension  Still elevated  Permissive hypertension (OK if < 220/120) but gradually normalize in 5-7 days  Long-term BP goal normotensive  Compliance is further emphasized  Hyperlipidemia  Home meds: No lipid lowering medications prior to admission  LDL 195, goal < 70  Now on Lipitor 80 mg daily  Continue statin at discharge  Other Stroke Risk Factors  ETOH use, advised to drink no more than 1 - 2 drink(s) a day  Obesity, Body mass index is 35.86 kg/(m^2)., recommend weight loss, diet and exercise as appropriate   Hx stroke/TIA (by MRI)  Family hx stroke (mother)  Other Active Problems    Hospital day # 2 I had a long  discussion with the patient and her sister at the bedside regarding her stroke, reviewed personally imaging studies and stroke workup so far, discuss risk for recurrent stroke and answered questions. Greater than 50% time during this 25 minute visit was spent on counseling and coordination of care about her stroke risk, prevention and treatment Delia Heady, MD Stroke Neurology 05/18/2016 1:42 PM      To contact Stroke Continuity provider, please refer to [email or web address]. After hours, contact General Neurology

## 2016-05-18 NOTE — Clinical Social Work Note (Signed)
CSW reviewed PT recommendations for CIR vs. SNF. Patient ambulating 300 feet with minimal assist. Insurance will likely not cover SNF stay if patient is ambulating this far. Patient will need either CIR or HHPT.  CSW signing off.  Charlynn CourtSarah Kojo Liby, CSW [phone]

## 2016-05-18 NOTE — Progress Notes (Signed)
OT Cancellation Note  Patient Details Name: Kathleen PearlConstance M Larson MRN: [id number] DOB: [birth year]   Cancelled Treatment:    Reason Eval/Treat Not Completed: Other (comment).. Pt's K+ this AM 3.0 pt to get K+ this morning--will await pt to receive K+ then initiate eva.Evette Georges.  Edelin Fryer Eva [phone] 05/18/2016, 8:51 AM

## 2016-05-18 NOTE — Progress Notes (Signed)
  Echocardiogram 2D Echocardiogram has been performed.  Janalyn HarderWest, Mayrani Khamis R 05/18/2016, 3:28 PM

## 2016-05-18 NOTE — Progress Notes (Signed)
*  PRELIMINARY RESULTS* Vascular Ultrasound Carotid Duplex (Doppler) has been completed.  Preliminary findings: Bilateral: No significant (1-39%) ICA stenosis. Antegrade vertebral flow.   Farrel DemarkJill Eunice, RDMS, RVT  05/18/2016, 4:11 PM

## 2016-05-18 NOTE — Progress Notes (Signed)
STROKE TEAM PROGRESS NOTE   SUBJECTIVE (INTERVAL HISTORY)  Patient is accompanied by her sister. She feels she is getting better but is not back to baseline. Her blood pressure is better controlled and is in the 160-180 range. Transthoracic echo and carotid ultrasound are unremarkable. I discussed the test results with the patient and family and answered questions   OBJECTIVE Temp:  [97.6 F (36.4 C)-98.6 F (37 C)] 98.1 F (36.7 C) (07/11 1100) Pulse Rate:  [64-91] 72 (07/11 1200) Cardiac Rhythm:  [-] Normal sinus rhythm (07/11 1229) Resp:  [9-25] 16 (07/11 1200) BP: (130-211)/(76-119) 179/106 mmHg (07/11 1200) SpO2:  [95 %-100 %] 100 % (07/11 1200)  CBC:   Recent Labs Lab 05/16/16 1100 05/18/16 0528  WBC 5.9 6.4  NEUTROABS 3.1  --   HGB 15.1* 13.8  HCT 46.1* 42.3  MCV 81.3 81.3  PLT 256 229    Basic Metabolic Panel:   Recent Labs Lab 05/18/16 0528 05/19/16 0827  NA 138 139  K 3.0* 3.8  CL 105 107  CO2 26 26  GLUCOSE 140* 108*  BUN 12 9  CREATININE 0.91 0.87  CALCIUM 9.0 9.2  MG 1.9  --     Lipid Panel:     Component Value Date/Time   CHOL 277* 05/16/2016 1545   TRIG 115 05/16/2016 1545   HDL 59 05/16/2016 1545   CHOLHDL 4.7 05/16/2016 1545   VLDL 23 05/16/2016 1545   LDLCALC 195* 05/16/2016 1545   HgbA1c:  Lab Results  Component Value Date   HGBA1C 5.9* 05/16/2016   Urine Drug Screen:     Component Value Date/Time   LABOPIA NEGATIVE 01/29/2009 0800   COCAINSCRNUR NEGATIVE 01/29/2009 0800   LABBENZ NEGATIVE 01/29/2009 0800   AMPHETMU NEGATIVE 01/29/2009 0800      IMAGING  Ct Head Wo Contrast 05/16/2016   No CT evidence of acute intracranial abnormality. Focal hypodensity in the right periventricular white matter overlying the right frontal horn, as well as within the left basal ganglia. These may represent prior episode of infarction or demyelination.   MRI HEAD  05/16/2016   1. 1 cm acute ischemic nonhemorrhagic infarct within the inferior  aspect of the posterior limb of the left internal capsule, along the major descending white matter tract.  2. Multiple remote lacunar infarcts involving the bilateral corona radiata, right basal ganglia, left thalamus, and right pons.  3. Mild to moderate chronic small vessel ischemic disease.   MRA HEAD  05/16/2016   1. No large or proximal arterial branch occlusion. No correctable stenosis.  2. Extensive atheromatous irregularity with stenoses throughout the anterior cerebral arteries and posterior cerebral arteries bilaterally.  3. Distal small vessel atheromatous irregularity within the MCA branches bilaterally.  TTE pending  CUS pending   PHYSICAL EXAM General - Well nourished, well developed, in no apparent distress.  Ophthalmologic - Sharp disc margins OU.   Cardiovascular - Regular rate and rhythm.  Mental Status -  Level of arousal and orientation to time, place, and person were intact. Language including expression, naming, repetition, comprehension was assessed and found intact. Fund of Knowledge was assessed and was intact.  Cranial Nerves II - XII - II - Visual field intact OU. III, IV, VI - Extraocular movements intact. V - Facial sensation intact bilaterally. VII - mild right nasolabial fold flattening. VIII - Hearing & vestibular intact bilaterally. X - Palate elevates symmetrically. XI - Chin turning & shoulder shrug intact bilaterally. XII - Tongue protrusion intact.  Motor  Strength - The patient's strength was normal in all extremities except right UE 4/5 and pronator drift was present on the right.  Bulk was normal and fasciculations were absent.   Motor Tone - Muscle tone was assessed at the neck and appendages and was normal.  Reflexes - The patient's reflexes were 1+ in all extremities and she had no pathological reflexes.  Sensory - Light touch, temperature/pinprick were assessed and were symmetrical.    Coordination - The patient had normal movements  in the left hand, but mild ataxia on the left hand not out of proportion to her weakness.  Tremor was absent.  Gait and Station - deferred as she just finished PT.   ASSESSMENT/PLAN Ms. Kathleen Larson is a 50 y.o. female with history of hypertension presenting with right arm weakness and numbness as well as dysarthria in the setting of severe hypertension. She did not receive IV t-PA due to late presentation.  Stroke:  Left PLIC infarct likely secondary to small vessel disease.  Resultant  LUE weakness  MRI - acute infarct within the left internal capsule. Multiple remote lacunar infarcts.  MRA - extensive intracranial atherosclerosis   Carotid Doppler - no significant stenosis.  2D Echo normal ejection fraction. No cardiac source of embolism.  LDL - 195  HgbA1c 5.9  VTE prophylaxis - SCDs Diet Heart Room service appropriate?: Yes; Fluid consistency:: Thin  No antithrombotic prior to admission, now on aspirin 81 mg daily. Due to extensive intracranial stenosis, recommend dual antiplatelet with ASA 325 and plavix 75 for 3 months and then plavix alone.   Patient counseled to be compliant with her antithrombotic medications  Ongoing aggressive stroke risk factor management  Therapy recommendations: CIR recommended  Disposition: Pending  Pt IS NOT  INTERESTED IN STROKE AF trial  Intracranial atherosclerosis  Involving in multiple intracranial vessels  Likely due to chronic uncontrolled HTN and HLD  Recommend dual antiplatelet with ASA 325 and plavix 75 for 3 months and then plavix alone.   Hypertension  Still elevated  Permissive hypertension (OK if < 220/120) but gradually normalize in 5-7 days  Long-term BP goal normotensive  Compliance is further emphasized  Hyperlipidemia  Home meds: No lipid lowering medications prior to admission  LDL 195, goal < 70  Now on Lipitor 80 mg daily  Continue statin at discharge  Other Stroke Risk Factors  ETOH use,  advised to drink no more than 1 - 2 drink(s) a day  Obesity, Body mass index is 35.86 kg/(m^2)., recommend weight loss, diet and exercise as appropriate   Hx stroke/TIA (by MRI)  Family hx stroke (mother)  Hospital day # 3  I have personally examined this patient, reviewed notes, independently viewed imaging studies, participated in medical decision making and plan of care. I have made any additions or clarifications directly to the above note. Agree with note above.  Continue aspirin for stroke prevention and statin for hyperlipidemia. Patient was also counseled about healthy diet and lifestyle changes including regular exercise and weight loss. She was advised to follow-up in the stroke clinic in 2 months with stroke nurse practitioner. Stroke service will sign off. Kindly call for questions.  Delia Heady, MD Medical Director [facility] Pager: [phone] 05/19/2016 1:26 PM      To contact Stroke Continuity provider, please refer to [email or web address]. After hours, contact General Neurology

## 2016-05-18 NOTE — Progress Notes (Signed)
Family Medicine Teaching Service Daily Progress Note Intern Pager: [phone]  Patient name: Kathleen Larson Medical record number: [id number] Date of birth: 08/21/1966 Age: 50 y.o. Gender: female  Primary Care Provider: No primary care provider on file. Consultants: Neurology Code Status: FULL (discussed on admission)  Pt Overview and Major Events to Date:  07/08 Admitted  Assessment and Plan: Kathleen Larson is a 50 y.o. female presenting with right hand numbness, weakness and slurred speach . PMH is significant for HTN, HLD, constipation  # Ischemic CVA:  CT head negative for acute processes. MRI/MRA brain: 1 cm acute ischemic nonhemorrhagic infarct within the inferior aspect of the posterior limb of the left internal capsule, along the major descending white matter tract. Multiple remote lacunar infarcts involving the bilateral corona radiata, right basal ganglia, left thalamus, and right pons. Extensive atheromatous irregularity with stenoses throughout the anterior cerebral arteries and posterior cerebral arteries bilaterally. Tot Chol 277, LDL 195, HDL 59.  Trop 0.04>0.03>0.04>0.03 EKG improved compared to yesterday and more consistent with LVH.  AM EKG pending.  PT recommends CIR.  Patient screened yesterday for eligibility and we can order inpatient rehab consult today.   Spoke with Cardiology regarding EKG changes, felt to be most consistent with LVH, but recommended echo and trending trop to r/o MI.  A1C= 5.9.  Hypokalemic this AM, repleted with will do another this afternoon.   PMR PA saw patient and is deferring CIR for now.  - cont elemetry - VS per floor protocol  - Neuro c/s in ED, appreciate recs - hypokalemia to 3.0 repleted wiith BID and ordered mag -recheck BMP - f/u Carotid dopplers (not done as of 7/10 AM) - consult OT- deferred until K+ improves, will call - PT eval; appreciate recs - echo not done, need to follow up - ASA 81mg  qd  # Accelerated  HTN/ Hypertensive emergency: BP largely been in 160s-170s overnight, 215/104 this am. Labetalol x1 overnight.  On Lisinopril-HCTZ outpatient but is not compliant - Per neuro: permissive HTN 220/110 in the setting of acute CVA - started HCTZ/lisinopril, DC labetolol  # HLD:  - Lipitor 80mg  daily  FEN/GI: NPO until assessed by SLP/ RN bedside Prophylaxis: SCDs for now  Disposition: Discharge pending PT/Neuro recs.  PT recommends CIR Subjective:  Patient reports that she is feeling fine this am.  She is still having weakness in her RUE and hand compared to her left side.    PT notes that patient had issues with balance after ambulating several feet.  She also had difficulty getting up stairs but was able to do so without assistance.  Recommended CIR, but CIR consult will defer inpatient rehab for now.   Objective: Temp:  [97.8 F (36.6 C)-99.1 F (37.3 C)] 98.1 F (36.7 C) (07/10 1100) Pulse Rate:  [62-82] 82 (07/10 1200) Resp:  [10-26] 18 (07/10 1200) BP: (153-215)/(74-142) 211/119 mmHg (07/10 1332) SpO2:  [95 %-100 %] 100 % (07/10 1200) Physical Exam: General: awake, alert, NAD, sitting up bedside eating breakfast Cardiovascular: RRR, no murmurs, +2DP Respiratory: CTAB, normal WOB on room air Abdomen: obese, soft, NT/ND, +BS Extremities: WWP, no edema Neuro: decreased light touch sensation to RUE, grip strength decreased in right hand compared to left, 3/5 external rotator, 3/5 extensors, 3/5 flexors of RUE, slightly decreased strength with dorsiflexion of right foot but still 5/5.  Follows commands.  Speech still slurred.    Laboratory:  Recent Labs Lab 05/16/16 1100 05/18/16 0528  WBC 5.9 6.4  HGB 15.1* 13.8  HCT 46.1* 42.3  PLT 256 229    Recent Labs Lab 05/16/16 1100 05/18/16 0528  NA 139 138  K 3.6 3.0*  CL 105 105  CO2 28 26  BUN 8 12  CREATININE 0.88 0.91  CALCIUM 9.5 9.0  PROT 7.5  --   BILITOT 1.4*  --   ALKPHOS 105  --   ALT 22  --   AST 24  --    GLUCOSE 118* 140*   Lipid Panel     Component Value Date/Time   CHOL 277* 05/16/2016 1545   TRIG 115 05/16/2016 1545   HDL 59 05/16/2016 1545   CHOLHDL 4.7 05/16/2016 1545   VLDL 23 05/16/2016 1545   LDLCALC 195* 05/16/2016 1545   Cardiac Panel (last 3 results)  Recent Labs  05/16/16 2224 05/17/16 0518 05/17/16 1410  TROPONINI 0.03* 0.04* 0.03*   TSH 1.381 A1c: 5.9  Imaging/Diagnostic Tests: Mr Brain Wo Contrast  05/16/2016  CLINICAL DATA:  Initial evaluation for right hand numbness with slurred speech. EXAM: MRI HEAD WITHOUT CONTRAST MRA HEAD WITHOUT CONTRAST TECHNIQUE: Multiplanar, multiecho pulse sequences of the brain and surrounding structures were obtained without intravenous contrast. Angiographic images of the head were obtained using MRA technique without contrast. COMPARISON:  Prior CT from earlier the same day. FINDINGS: MRI HEAD FINDINGS Study somewhat limited by susceptibility artifact. Cerebral volume within normal limits for patient age. Patchy T2/FLAIR hyperintensity within the periventricular and deep white matter both cerebral hemispheres most consistent with chronic small vessel ischemic disease, mild to moderate in nature. Remote lacunar infarcts within the bilateral corona radiata, right basal ganglia, left thalamus, and right pons. Small amount of associated chronic blood products with the remote right pontine infarct. 1 cm focus of restricted diffusion positioned at the inferior aspect of the posterior limb of the left internal capsule along the major descending white matter tract (series 4, image 27). No associated hemorrhage or mass effect. No other evidence for acute infarct. Gray-white matter differentiation otherwise maintained. Major intracranial vascular flow voids are preserved. No acute intracranial hemorrhage. No mass lesion, midline shift, or mass effect. No hydrocephalus. No extra-axial fluid collection. Serpiginous FLAIR signal abnormality overlying  multiple cortical sulci within the bilateral frontal regions consistent with artifact. Major dural sinuses are grossly patent. Craniocervical junction normal. Visualized upper cervical spine unremarkable. Pituitary gland normal.  Globes and orbits within normal limits. Paranasal sinuses are clear. No mastoid effusion. Inner ear structures normal. Bone marrow signal intensity within normal limits. Susceptibility artifact overlies the scalp. No definite scalp soft tissue abnormality. MRA HEAD FINDINGS ANTERIOR CIRCULATION: Visualized distal cervical segments of the internal carotid arteries are patent with antegrade flow. Petrous, cavernous, and supraclinoid segments widely patent bilaterally without flow-limiting stenosis. A1 segments patent. Anterior communicating artery normal. Anterior cerebral arteries opacified to their distal aspects but demonstrate multi focal atheromatous irregularity, right slightly worse than left. In particular, there is a severe focal stenosis within the right A2 segment (series 7, image 1 await). Superimposed multifocal stenoses present. M1 segments patent without stenosis or occlusion. MCA bifurcations normal. Short-segment moderate left M2 stenosis (series 704, image 4). Atheromatous irregularity within the distal MCA branches bilaterally. POSTERIOR CIRCULATION: Vertebral arteries patent to the vertebrobasilar junction. Posterior inferior cerebral arteries patent, with the left PICA demonstrating multifocal atheromatous irregularity. Basilar artery widely patent. Superior cerebellar arteries patent bilaterally. Both of the posterior cerebral arteries arise from the basilar artery. Extensive multi focal atheromatous irregularity with moderate to severe multi  focal stenoses present within the PCAs bilaterally. Changes most severe within the mid left P 2 segment wearing a focal severe stenosis is present (series 706, image 11). PCAs are opacified to their distal aspect. No aneurysm or  vascular malformation. IMPRESSION: MRI HEAD IMPRESSION: 1. 1 cm acute ischemic nonhemorrhagic infarct within the inferior aspect of the posterior limb of the left internal capsule, along the major descending white matter tract. 2. Multiple remote lacunar infarcts involving the bilateral corona radiata, right basal ganglia, left thalamus, and right pons. 3. Mild to moderate chronic small vessel ischemic disease. MRA HEAD IMPRESSION: 1. No large or proximal arterial branch occlusion. No correctable stenosis. 2. Extensive atheromatous irregularity with stenoses throughout the anterior cerebral arteries and posterior cerebral arteries bilaterally. 3. Distal small vessel atheromatous irregularity within the MCA branches bilaterally. Electronically Signed   By: Rise MuBenjamin  McClintock M.D.   On: 05/16/2016 20:04   Mr Maxine GlennMra Head/brain Wo Cm  05/16/2016  CLINICAL DATA:  Initial evaluation for right hand numbness with slurred speech. EXAM: MRI HEAD WITHOUT CONTRAST MRA HEAD WITHOUT CONTRAST TECHNIQUE: Multiplanar, multiecho pulse sequences of the brain and surrounding structures were obtained without intravenous contrast. Angiographic images of the head were obtained using MRA technique without contrast. COMPARISON:  Prior CT from earlier the same day. FINDINGS: MRI HEAD FINDINGS Study somewhat limited by susceptibility artifact. Cerebral volume within normal limits for patient age. Patchy T2/FLAIR hyperintensity within the periventricular and deep white matter both cerebral hemispheres most consistent with chronic small vessel ischemic disease, mild to moderate in nature. Remote lacunar infarcts within the bilateral corona radiata, right basal ganglia, left thalamus, and right pons. Small amount of associated chronic blood products with the remote right pontine infarct. 1 cm focus of restricted diffusion positioned at the inferior aspect of the posterior limb of the left internal capsule along the major descending white matter  tract (series 4, image 27). No associated hemorrhage or mass effect. No other evidence for acute infarct. Gray-white matter differentiation otherwise maintained. Major intracranial vascular flow voids are preserved. No acute intracranial hemorrhage. No mass lesion, midline shift, or mass effect. No hydrocephalus. No extra-axial fluid collection. Serpiginous FLAIR signal abnormality overlying multiple cortical sulci within the bilateral frontal regions consistent with artifact. Major dural sinuses are grossly patent. Craniocervical junction normal. Visualized upper cervical spine unremarkable. Pituitary gland normal.  Globes and orbits within normal limits. Paranasal sinuses are clear. No mastoid effusion. Inner ear structures normal. Bone marrow signal intensity within normal limits. Susceptibility artifact overlies the scalp. No definite scalp soft tissue abnormality. MRA HEAD FINDINGS ANTERIOR CIRCULATION: Visualized distal cervical segments of the internal carotid arteries are patent with antegrade flow. Petrous, cavernous, and supraclinoid segments widely patent bilaterally without flow-limiting stenosis. A1 segments patent. Anterior communicating artery normal. Anterior cerebral arteries opacified to their distal aspects but demonstrate multi focal atheromatous irregularity, right slightly worse than left. In particular, there is a severe focal stenosis within the right A2 segment (series 7, image 1 await). Superimposed multifocal stenoses present. M1 segments patent without stenosis or occlusion. MCA bifurcations normal. Short-segment moderate left M2 stenosis (series 704, image 4). Atheromatous irregularity within the distal MCA branches bilaterally. POSTERIOR CIRCULATION: Vertebral arteries patent to the vertebrobasilar junction. Posterior inferior cerebral arteries patent, with the left PICA demonstrating multifocal atheromatous irregularity. Basilar artery widely patent. Superior cerebellar arteries patent  bilaterally. Both of the posterior cerebral arteries arise from the basilar artery. Extensive multi focal atheromatous irregularity with moderate to severe multi focal stenoses present within  the PCAs bilaterally. Changes most severe within the mid left P 2 segment wearing a focal severe stenosis is present (series 706, image 11). PCAs are opacified to their distal aspect. No aneurysm or vascular malformation. IMPRESSION: MRI HEAD IMPRESSION: 1. 1 cm acute ischemic nonhemorrhagic infarct within the inferior aspect of the posterior limb of the left internal capsule, along the major descending white matter tract. 2. Multiple remote lacunar infarcts involving the bilateral corona radiata, right basal ganglia, left thalamus, and right pons. 3. Mild to moderate chronic small vessel ischemic disease. MRA HEAD IMPRESSION: 1. No large or proximal arterial branch occlusion. No correctable stenosis. 2. Extensive atheromatous irregularity with stenoses throughout the anterior cerebral arteries and posterior cerebral arteries bilaterally. 3. Distal small vessel atheromatous irregularity within the MCA branches bilaterally. Electronically Signed   By: Rise Mu M.D.   On: 05/16/2016 20:04      Renne Musca, MD 05/18/2016, 1:36 PM PGY-1, [facility] FPTS Intern pager: [phone], text pages welcome

## 2016-05-18 NOTE — Evaluation (Signed)
Occupational Therapy Evaluation Patient Details Name: Kathleen Larson MRN: [id number] DOB: Apr 24, 1966 Today's Date: 05/18/2016    History of Present Illness Kathleen Larson is a 50 y.o. female presenting with right hand numbness, weakness and slurred speach . PMH is significant for HTN, HLD, constipation. 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 She would greatly benefit from CIR to get her back towards her in a shorter time frame.    Follow Up Recommendations  CIR    Equipment Recommendations  3 in 1 bedside comode;Tub/shower bench       Precautions / Restrictions Precautions Precautions: Fall Restrictions Weight Bearing Restrictions: No      Mobility Bed Mobility               General bed mobility comments: Pt up in recliner upon my arrival  Transfers Overall transfer level: Needs assistance Equipment used: 1 person hand held assist Transfers: Sit to/from Stand Sit to Stand: Min assist         General transfer comment: increased time to get into full standing position and pt with decreased control stand>sit as well--"plops"    Balance Overall balance assessment: Needs assistance Sitting-balance support: No upper extremity supported;Feet supported Sitting balance-Leahy Scale: Fair     Standing balance support: Single extremity supported;During functional activity Standing balance-Leahy Scale: Poor                              ADL Overall ADL's : Needs assistance/impaired Eating/Feeding: Set up;Sitting Eating/Feeding Details  (indicate cue type and reason): Having to use her left hand as dominate hand now due to decreased use of RUE Grooming: Moderate assistance;Sitting   Upper Body Bathing: Minimal assitance;Sitting Upper Body Bathing Details (indicate cue type and reason): predominately using LUE, RUE can do gross movements but decreased proprioception makes really have to focus visually when using her RUE Lower Body Bathing: Minimal assistance;Sit to/from stand   Upper Body Dressing : Moderate assistance;Sitting   Lower Body Dressing: Moderate assistance (min A sit<>stand)   Toilet Transfer: Minimal assistance;Ambulation;Regular Toilet;Grab bars (HHA)   Toileting- Clothing Manipulation and Hygiene: Moderate assistance (min A sit<>stand)               Vision Vision Assessment?: Yes Eye Alignment: Within Functional Limits Ocular Range of Motion: Within Functional Limits Alignment/Gaze Preference: Within Defined Limits Tracking/Visual Pursuits: Able to track stimulus in all quads without difficulty Saccades: Within functional limits Convergence: Within functional limits Visual Fields: No apparent deficits          Pertinent Vitals/Pain Pain Assessment: No/denies pain     Hand Dominance Right   Extremity/Trunk Assessment Upper Extremity Assessment Upper Extremity Assessment: RUE deficits/detail RUE Deficits / Details: Pt has decreasd active movement overall of RUE, shoulder flexion ~20 degrees; can get finger to nose but slowly and increased effort at end range; can supinate and pronate; cannot extend wrist, has composite grip but can easily pull items out of her hand, decreased finger extension RUE Sensation: decreased proprioception  RUE Coordination: decreased fine motor;decreased gross motor           Communication Communication Communication: Expressive difficulties   Cognition Arousal/Alertness: Awake/alert Behavior During Therapy: WFL for tasks assessed/performed Overall  Cognitive Status: Impaired/Different from baseline Area of Impairment: Safety/judgement         Safety/Judgement: Decreased awareness of safety            Exercises   Other Exercises Other Exercises: I gave pt a handout of RUE activities for her to do (touch her nose, palm up/palm down, tapping hand on leg, transferring-grasping- and letting go of objects, clapping hands focusing on keeping right hand open). I educated her on the more repetitions the better for potential increased use of RUE        Home Living Family/patient expects to be discharged to:: Private residence Living Arrangements: Alone Available Help at Discharge: Family;Available 24 hours/day Type of Home: Apartment Home Access: Stairs to enter Entrance Stairs-Number of Steps: 1 Entrance Stairs-Rails: None Home Layout: One level     Bathroom Shower/Tub: Tub/shower unit;Curtain Shower/tub characteristics: Engineer, building servicesCurtain Bathroom Toilet: Standard     Home Equipment: None   Additional Comments: pt works at [employer], lots of lifting  Lives With: Alone    Prior Functioning/Environment Level of Independence: Independent        Comments: pt reports that daughter can stay with her at night and sister can be with her in the day and take her to appts     OT Diagnosis: Generalized weakness;Hemiplegia dominant side   OT Problem List: Decreased strength;Decreased range of motion;Impaired balance (sitting and/or standing);Impaired UE functional use;Obesity;Decreased knowledge of use of DME or AE;Decreased coordination   OT Treatment/Interventions: Self-care/ADL training;Patient/family education;Balance training;Therapeutic activities;Therapeutic exercise;DME and/or AE instruction    OT Goals(Current goals can be found in the care plan section) Acute Rehab OT Goals Patient Stated Goal: to back to normal life OT Goal Formulation: With patient Time For Goal Achievement: 06/01/16 Potential to Achieve Goals: Good  OT  Frequency: Min 3X/week              End of Session Equipment Utilized During Treatment:  (1 HHA from recliner into bathroom and back to recliner) Nurse Communication:  (BP up--RN in to give her meds during session)  Activity Tolerance: Patient tolerated treatment well Patient left: in chair;with call bell/phone within reach;with chair alarm set   Time: 1255-1330 OT Time Calculation (min): 35 min Charges:  OT General Charges $OT Visit: 1 Procedure OT Evaluation $OT Eval Moderate Complexity: 1 Procedure OT Treatments $Therapeutic Activity: 8-22 mins  Evette GeorgesLeonard, Shanterria Franta Eva [phone] 05/18/2016, 2:48 PM

## 2016-05-19 DIAGNOSIS — Z91199 Patient's noncompliance with other medical treatment and regimen due to unspecified reason: Secondary | ICD-10-CM | POA: Insufficient documentation

## 2016-05-19 DIAGNOSIS — R7303 Prediabetes: Secondary | ICD-10-CM

## 2016-05-19 DIAGNOSIS — Z9119 Patient's noncompliance with other medical treatment and regimen: Secondary | ICD-10-CM | POA: Insufficient documentation

## 2016-05-19 DIAGNOSIS — I63032 Cerebral infarction due to thrombosis of left carotid artery: Secondary | ICD-10-CM

## 2016-05-19 DIAGNOSIS — I6300 Cerebral infarction due to thrombosis of unspecified precerebral artery: Secondary | ICD-10-CM

## 2016-05-19 DIAGNOSIS — R29898 Other symptoms and signs involving the musculoskeletal system: Secondary | ICD-10-CM

## 2016-05-19 LAB — BASIC METABOLIC PANEL
Anion gap: 6 (ref 5–15)
BUN: 9 mg/dL (ref 6–20)
CHLORIDE: 107 mmol/L (ref 101–111)
CO2: 26 mmol/L (ref 22–32)
CREATININE: 0.87 mg/dL (ref 0.44–1.00)
Calcium: 9.2 mg/dL (ref 8.9–10.3)
GFR calc Af Amer: >60 mL/min (ref >60)
GFR calc non Af Amer: >60 mL/min (ref >60)
Glucose, Bld: 108 mg/dL — ABNORMAL HIGH (ref 65–99)
Potassium: 3.8 mmol/L (ref 3.5–5.1)
SODIUM: 139 mmol/L (ref 135–145)

## 2016-05-19 NOTE — Progress Notes (Signed)
Speech Language Pathology Treatment: Cognitive-Linquistic  Patient Details Name: Kathleen PearlConstance M Larson MRN: [id number] DOB: 12/09/1965 Today's Date: 05/19/2016 Time: [id number] SLP Time Calculation (min) (ACUTE ONLY): 13 min  Assessment / Plan / Recommendation Clinical Impression  Pt seen for f/u cognitive-linguistic tx. She continues to demonstrate motor speech difficulties with speech slurred at the conversational level. Pt requires Mod cues for clear production of multisyllabic words and consonant clusters. Attempted diadochokinetic rates with significant difficulty noted with clear pronunciation of /k/, making it difficult for her to participate in trials with clear articulation. Mod cues provided for slower rate to facilitate. SLP provided handout with tongue twisters for pt to practice speech production and intelligibility strategies. Continue to recommend CIR for cognitive and communicative follow up.    HPI HPI: 50 y.o. female presenting with right hand numbness, weakness and slurred speach . PMH is significant for HTN, HLD, constipation. 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 CCC-SLP [phone]  Kathleen Hamaiewonsky, Kathleen Larson 05/19/2016, 10:31 AM

## 2016-05-19 NOTE — Progress Notes (Addendum)
Physical Therapy Treatment Patient Details Name: Kathleen Larson MRN: [id number] DOB: 11/19/1965 Today's Date: 05/19/2016    History of Present Illness Kathleen Larson is a 50 y.o. female presenting with right hand numbness, weakness and slurred speach . PMH is significant for HTN, HLD, constipation. 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 She does show decreased balance and fall risk when preforming higher level static and dynamic balance tasks during DGI and Berg putting her at risk for falls.  She would have the best potential for recovery if she was able to go through the CIR program before d/c home.  PT to try a cane next session.    Follow Up Recommendations  CIR    Equipment Recommendations  None recommended by PT    Recommendations for Other Services   NA     Precautions / Restrictions Precautions Precautions: Fall Restrictions Weight Bearing Restrictions: No    Mobility  Bed Mobility Overal bed mobility: Modified Independent Bed Mobility: Supine to Sit     Supine to sit: Modified independent (Device/Increase time)     General bed mobility comments: extra time needed and used bed rail to pull up to sitting EOB.  Transfers Overall transfer level: Modified independent   Transfers: Sit to/from Stand Sit to Stand: Modified independent (Device/Increase time)         General transfer comment: uses hands for transitions, pt unable to stand without using her left hand to push up  Ambulation/Gait Ambulation/Gait assistance: Min guard Ambulation Distance (Feet): 300 Feet Assistive device: None Gait Pattern/deviations: Step-through pattern;Staggering left;Staggering right Gait velocity: decreased Gait velocity interpretation: Below normal speed for age/gender General Gait Details: mildly staggering gait pattern, slower speed, better with more complex  tasks today        Modified Rankin (Stroke Patients Only) Modified Rankin (Stroke Patients Only) Pre-Morbid Rankin Score: No symptoms Modified Rankin: Moderately severe disability     Balance Overall balance assessment: Needs assistance Sitting-balance support: No upper extremity supported;Feet supported Sitting balance-Leahy Scale: Good     Standing balance support: No upper extremity supported;Single extremity supported Standing balance-Leahy Scale: Good   Single Leg Stance - Right Leg: 5 Single Leg Stance - Left Leg: 10 Tandem Stance - Right Leg: 30 (min guard close supervision)     Rhomberg - Eyes Closed: 10 (close supervision) High level balance activites: Side stepping;Backward walking;Direction changes;Sudden stops;Head turns High Level Balance Comments: min guard assist for higher level balance activities in the hallway    Cognition Arousal/Alertness: Awake/alert Behavior During Therapy: WFL for tasks assessed/performed Overall Cognitive Status: Impaired/Different from baseline Area of Impairment: Safety/judgement         Safety/Judgement: Decreased awareness of safety                 Pertinent Vitals/Pain Pain Assessment: No/denies pain           PT Goals (current goals can now be found in the care plan section) Acute Rehab PT Goals Patient Stated Goal: to back to normal life Progress towards PT goals: Progressing toward goals    Frequency  Min 4X/week    PT Plan Current plan remains appropriate       End of Session Equipment Utilized During Treatment: Gait belt Activity Tolerance: Patient tolerated treatment well Patient left: in chair;with call bell/phone within reach;with family/visitor present     Time: 4098-1191  PT Time Calculation (min) (ACUTE ONLY): 17 min  Charges:  $Gait Training: 8-22 mins                      Brayln Duque B. Vera Furniss, PT, DPT [phone]   05/19/2016, 1:24 PM

## 2016-05-19 NOTE — Progress Notes (Signed)
Family Medicine Teaching Service Daily Progress Note Intern Pager: [phone]  Patient name: Kathleen Larson Medical record number: [id number] Date of birth: Sep 05, 1966 Age: 50 y.o. Gender: female  Primary Care Provider: No primary care provider on file. Consultants: Neurology Code Status: FULL (discussed on admission)  Pt Overview and Major Events to Date:  07/08 Admitted 7/10/ normal EF and duplex US  Assessment and Plan: Kathleen Larson is a 50 y.o. female presenting with right hand numbness, weakness and slurred speach . PMH is significant for HTN, HLD, constipation  # Ischemic CVA:  CT head negative for acute processes. MRI/MRA brain: 1 cm acute ischemic nonhemorrhagic infarct within the inferior aspect of the posterior limb of the left internal capsule, along the major descending white matter tract. Multiple remote lacunar infarcts involving the bilateral corona radiata, right basal ganglia, left thalamus, and right pons. Extensive atheromatous irregularity with stenoses throughout the anterior cerebral arteries and posterior cerebral arteries bilaterally. Tot Chol 277, LDL 195, HDL 59.  Trop 0.04>0.03>0.04>0.03 EKG improved compared to yesterday and more consistent with LVH.  AM EKG pending.  PT recommends CIR.  Patient screened yesterday for eligibility and we can order inpatient rehab consult today.   Spoke with Cardiology regarding EKG changes, felt to be most consistent with LVH, but recommended echo and trending trop to r/o MI.  A1C= 5.9.  PT/OT recommend CIR, PMR PA saw patient and is deferring CIR for now.  Spoke with IR and they will see patiet today and talk to PA about admitting her.  Possibly tomorrow.  Back up plan is to send home with home PT/OT.  Spoke with social work about seeing what her eligibility is for disability.   - f/u PMR consult - cont telemetry - VS per floor protocol  - Neuro c/s in ED, appreciate recs - potassium back up to 3.8 - PT eval; appreciate  recs - ASA 81mg  qd  # Accelerated HTN/ Hypertensive emergency: BP largely been in 130s-140ss overnight,149/95 this am.   On Lisinopril-HCTZ outpatient but is not compliant.  Two doses labetalol last night.  - Per neuro: permissive HTN 220/110 in the setting of acute CVA - started HCTZ/lisinopril, DC labetalol standing  # HLD:  - Lipitor 80mg  daily  FEN/GI: NPO until assessed by SLP/ RN bedside Prophylaxis: SCDs for now  Disposition: Discharge pending PT/Neuro recs.  PT/OT recommends CIR Subjective:  Patient reports that she is feeling fine this am.  She is still having weakness in her RUE and hand compared to her left side.    Patient was able to get up and get to the bathroom on her own this AM.  Still having weakness in her hand, arm and left shoulder.  CIR to see her today and will try to ger her admitted when possible.   Objective: Temp:  [97.6 F (36.4 C)-98.6 F (37 C)] 98.2 F (36.8 C) (07/11 0700) Pulse Rate:  [64-91] 75 (07/11 0700) Resp:  [9-20] 13 (07/11 0700) BP: (130-211)/(76-119) 146/95 mmHg (07/11 0700) SpO2:  [96 %-100 %] 97 % (07/11 0700) Physical Exam: General: awake, alert, NAD, sitting up bedside eating breakfast Cardiovascular: RRR, no murmurs, +2DP Respiratory: CTAB, normal WOB on room air Abdomen: obese, soft, NT/ND, +BS Extremities: WWP, no edema Neuro: decreased light touch sensation to RUE, grip strength decreased in right hand compared to left, 3/5 external rotator, 3/5 extensors, 3/5 flexors of RUE, slightly decreased strength with dorsiflexion of right foot but still 5/5.  Follows commands.  Speech still  slurred.    Laboratory:  Recent Labs Lab 05/16/16 1100 05/18/16 0528  WBC 5.9 6.4  HGB 15.1* 13.8  HCT 46.1* 42.3  PLT 256 229    Recent Labs Lab 05/16/16 1100 05/18/16 0528 05/19/16 0827  NA 139 138 139  K 3.6 3.0* 3.8  CL 105 105 107  CO2 BUN CREATININE 0.88 0.91 0.87  CALCIUM 9.5 9.0 9.2  PROT 7.5  --   --    BILITOT 1.4*  --   --   ALKPHOS 105  --   --   ALT 22  --   --   AST 24  --   --   GLUCOSE 118* 140* 108*   Lipid Panel     Component Value Date/Time   CHOL 277* 05/16/2016 1545   TRIG 115 05/16/2016 1545   HDL 59 05/16/2016 1545   CHOLHDL 4.7 05/16/2016 1545   VLDL 23 05/16/2016 1545   LDLCALC 195* 05/16/2016 1545   Cardiac Panel (last 3 results)  Recent Labs  05/16/16 2224 05/17/16 0518 05/17/16 1410  TROPONINI 0.03* 0.04* 0.03*   TSH 1.381 A1c: 5.9  Imaging/Diagnostic Tests: Ct Head Wo Contrast  05/16/2016  CLINICAL DATA:  50 year old female with a history of right hand numbness. EXAM: CT HEAD WITHOUT CONTRAST TECHNIQUE: Contiguous axial images were obtained from the base of the skull through the vertex without intravenous contrast. COMPARISON:  None. FINDINGS: Unremarkable appearance of the calvarium without acute fracture or aggressive lesion. Unremarkable appearance of the scalp soft tissues. Unremarkable appearance of the bilateral orbits. Mastoid air cells are clear. No significant paranasal sinus disease No acute intracranial hemorrhage, midline shift, or mass effect. Gray-white differentiation is maintained, without CT evidence of acute ischemia. Unremarkable configuration of the ventricles. Focal hypodensity in the periventricular white matter of the right frontal horn. Focal hypodensity in the left basal ganglia. IMPRESSION: No CT evidence of acute intracranial abnormality. Focal hypodensity in the right periventricular white matter overlying the right frontal horn, as well as within the left basal ganglia. These may represent prior episode of infarction or demyelination. Signed, Yvone Neu. Loreta Ave, DO [facility] Radiology Electronically Signed   By: Gilmer Mor D.O.   On: 05/16/2016 12:24   Mr Brain Wo Contrast  05/16/2016  CLINICAL DATA:  Initial evaluation for right hand numbness with slurred speech. EXAM: MRI HEAD  WITHOUT CONTRAST MRA HEAD WITHOUT CONTRAST TECHNIQUE: Multiplanar, multiecho pulse sequences of the brain and surrounding structures were obtained without intravenous contrast. Angiographic images of the head were obtained using MRA technique without contrast. COMPARISON:  Prior CT from earlier the same day. FINDINGS: MRI HEAD FINDINGS Study somewhat limited by susceptibility artifact. Cerebral volume within normal limits for patient age. Patchy T2/FLAIR hyperintensity within the periventricular and deep white matter both cerebral hemispheres most consistent with chronic small vessel ischemic disease, mild to moderate in nature. Remote lacunar infarcts within the bilateral corona radiata, right basal ganglia, left thalamus, and right pons. Small amount of associated chronic blood products with the remote right pontine infarct. 1 cm focus of restricted diffusion positioned at the inferior aspect of the posterior limb of the left internal capsule along the major descending white matter tract (series 4, image 27). No associated hemorrhage or mass effect. No other evidence for acute infarct. Gray-white matter differentiation otherwise maintained. Major intracranial vascular flow voids are preserved. No acute intracranial hemorrhage. No mass lesion, midline shift, or mass effect.  No hydrocephalus. No extra-axial fluid collection. Serpiginous FLAIR signal abnormality overlying multiple cortical sulci within the bilateral frontal regions consistent with artifact. Major dural sinuses are grossly patent. Craniocervical junction normal. Visualized upper cervical spine unremarkable. Pituitary gland normal.  Globes and orbits within normal limits. Paranasal sinuses are clear. No mastoid effusion. Inner ear structures normal. Bone marrow signal intensity within normal limits. Susceptibility artifact overlies the scalp. No definite scalp soft tissue abnormality. MRA HEAD FINDINGS ANTERIOR CIRCULATION: Visualized distal cervical  segments of the internal carotid arteries are patent with antegrade flow. Petrous, cavernous, and supraclinoid segments widely patent bilaterally without flow-limiting stenosis. A1 segments patent. Anterior communicating artery normal. Anterior cerebral arteries opacified to their distal aspects but demonstrate multi focal atheromatous irregularity, right slightly worse than left. In particular, there is a severe focal stenosis within the right A2 segment (series 7, image 1 await). Superimposed multifocal stenoses present. M1 segments patent without stenosis or occlusion. MCA bifurcations normal. Short-segment moderate left M2 stenosis (series 704, image 4). Atheromatous irregularity within the distal MCA branches bilaterally. POSTERIOR CIRCULATION: Vertebral arteries patent to the vertebrobasilar junction. Posterior inferior cerebral arteries patent, with the left PICA demonstrating multifocal atheromatous irregularity. Basilar artery widely patent. Superior cerebellar arteries patent bilaterally. Both of the posterior cerebral arteries arise from the basilar artery. Extensive multi focal atheromatous irregularity with moderate to severe multi focal stenoses present within the PCAs bilaterally. Changes most severe within the mid left P 2 segment wearing a focal severe stenosis is present (series 706, image 11). PCAs are opacified to their distal aspect. No aneurysm or vascular malformation. IMPRESSION: MRI HEAD IMPRESSION: 1. 1 cm acute ischemic nonhemorrhagic infarct within the inferior aspect of the posterior limb of the left internal capsule, along the major descending white matter tract. 2. Multiple remote lacunar infarcts involving the bilateral corona radiata, right basal ganglia, left thalamus, and right pons. 3. Mild to moderate chronic small vessel ischemic disease. MRA HEAD IMPRESSION: 1. No large or proximal arterial branch occlusion. No correctable stenosis. 2. Extensive atheromatous irregularity with  stenoses throughout the anterior cerebral arteries and posterior cerebral arteries bilaterally. 3. Distal small vessel atheromatous irregularity within the MCA branches bilaterally. Electronically Signed   By: Rise MuBenjamin  McClintock M.D.   On: 05/16/2016 20:04   Mr Maxine GlennMra Head/brain Wo Cm  05/16/2016  CLINICAL DATA:  Initial evaluation for right hand numbness with slurred speech. EXAM: MRI HEAD WITHOUT CONTRAST MRA HEAD WITHOUT CONTRAST TECHNIQUE: Multiplanar, multiecho pulse sequences of the brain and surrounding structures were obtained without intravenous contrast. Angiographic images of the head were obtained using MRA technique without contrast. COMPARISON:  Prior CT from earlier the same day. FINDINGS: MRI HEAD FINDINGS Study somewhat limited by susceptibility artifact. Cerebral volume within normal limits for patient age. Patchy T2/FLAIR hyperintensity within the periventricular and deep white matter both cerebral hemispheres most consistent with chronic small vessel ischemic disease, mild to moderate in nature. Remote lacunar infarcts within the bilateral corona radiata, right basal ganglia, left thalamus, and right pons. Small amount of associated chronic blood products with the remote right pontine infarct. 1 cm focus of restricted diffusion positioned at the inferior aspect of the posterior limb of the left internal capsule along the major descending white matter tract (series 4, image 27). No associated hemorrhage or mass effect. No other evidence for acute infarct. Gray-white matter differentiation otherwise maintained. Major intracranial vascular flow voids are preserved. No acute intracranial hemorrhage. No mass lesion, midline shift, or mass effect. No hydrocephalus. No extra-axial fluid  collection. Serpiginous FLAIR signal abnormality overlying multiple cortical sulci within the bilateral frontal regions consistent with artifact. Major dural sinuses are grossly patent. Craniocervical junction normal.  Visualized upper cervical spine unremarkable. Pituitary gland normal.  Globes and orbits within normal limits. Paranasal sinuses are clear. No mastoid effusion. Inner ear structures normal. Bone marrow signal intensity within normal limits. Susceptibility artifact overlies the scalp. No definite scalp soft tissue abnormality. MRA HEAD FINDINGS ANTERIOR CIRCULATION: Visualized distal cervical segments of the internal carotid arteries are patent with antegrade flow. Petrous, cavernous, and supraclinoid segments widely patent bilaterally without flow-limiting stenosis. A1 segments patent. Anterior communicating artery normal. Anterior cerebral arteries opacified to their distal aspects but demonstrate multi focal atheromatous irregularity, right slightly worse than left. In particular, there is a severe focal stenosis within the right A2 segment (series 7, image 1 await). Superimposed multifocal stenoses present. M1 segments patent without stenosis or occlusion. MCA bifurcations normal. Short-segment moderate left M2 stenosis (series 704, image 4). Atheromatous irregularity within the distal MCA branches bilaterally. POSTERIOR CIRCULATION: Vertebral arteries patent to the vertebrobasilar junction. Posterior inferior cerebral arteries patent, with the left PICA demonstrating multifocal atheromatous irregularity. Basilar artery widely patent. Superior cerebellar arteries patent bilaterally. Both of the posterior cerebral arteries arise from the basilar artery. Extensive multi focal atheromatous irregularity with moderate to severe multi focal stenoses present within the PCAs bilaterally. Changes most severe within the mid left P 2 segment wearing a focal severe stenosis is present (series 706, image 11). PCAs are opacified to their distal aspect. No aneurysm or vascular malformation. IMPRESSION: MRI HEAD IMPRESSION: 1. 1 cm acute ischemic nonhemorrhagic infarct within the inferior aspect of the posterior limb of the left  internal capsule, along the major descending white matter tract. 2. Multiple remote lacunar infarcts involving the bilateral corona radiata, right basal ganglia, left thalamus, and right pons. 3. Mild to moderate chronic small vessel ischemic disease. MRA HEAD IMPRESSION: 1. No large or proximal arterial branch occlusion. No correctable stenosis. 2. Extensive atheromatous irregularity with stenoses throughout the anterior cerebral arteries and posterior cerebral arteries bilaterally. 3. Distal small vessel atheromatous irregularity within the MCA branches bilaterally. Electronically Signed   By: Rise Mu M.D.   On: 05/16/2016 20:04     Renne Musca, MD 05/19/2016, 9:36 AM PGY-1, [facility] FPTS Intern pager: [phone], text pages welcome

## 2016-05-19 NOTE — Progress Notes (Signed)
Patient to transfer to [facility]M 15. Report called to Yvone NeuMarie RN at [phone]. Multiple visitors in her room and they have requested to be allowed to visit for a short while and then move the patient. Accepting nurse has agreed to transfer after 1900.

## 2016-05-19 NOTE — Progress Notes (Signed)
NCM did contact Genie, with IP rehab. States no beds available. Will check for bed availability tomorrow for IP rehab. Contacted attending to make aware. Isidoro DonningAlesia Boe Deans RN CCM Case Mgmt phone [phone]

## 2016-05-19 NOTE — Care Management Note (Signed)
Case Management Note  Patient Details  Name: Kathleen Larson MRN: [id number] Date of Birth: 09-Oct-1966  Subjective/Objective:       Stroke             Action/Plan: Discharge Planning:  NCM spoke to pt and sister, Johnnye SimaCarolyn Merk at bedside. Pt states she lives at home alone. Independent prior to her hospital stay working full-time for a [employer]. States she had just signed her paperwork to transition to full-time with benefits. She was unsure if her benefits have actually started. Discussed FMLA and short term disability. Pt contacted her [employer] rep, Lanora Manislizabeth [phone]. She left message. No DME needed for home. Contacted Artistinancial Counselor to speak to pt about hospital bill, Medicaid and disability. Pt states she currently does not have a PCP. She was interested in applying for [employer] [employer]. Explained once [facility], they will arrange appt with her to follow up with financial counselor to apply for orange card. Will provide pt with [facility] which can be used once per year with a copay of $3. [facility].    Expected Discharge Date:  05/19/2016              Expected Discharge Plan:  Home w Home Health Services  In-House Referral:  Financial Counselor  Discharge planning Services  CM Consult  Post Acute Care Choice:  Home Health Choice offered to:  Patient  DME Arranged:  N/A DME Agency:  NA  HH Arranged:  PT HH Agency:  [facility]  Status of Service:  Completed, signed off  If discussed at Long Length of Stay Meetings, dates discussed:    Additional Comments:  Elliot CousinShavis, Keenon Leitzel Ellen, RN 05/19/2016, 1:46 PM

## 2016-05-19 NOTE — Progress Notes (Signed)
Patient doing well, still has some RUE deficits but is ambulating with no problems noted as well as communicating. Vital signs are stable, no complaint of pain, no nausea or vomitting. Family is at the bedside and has been for most of the day. Order for transfer to floor in place. May be discharged tomorrow to IPR or home depending on bed availability. Call bell in reach, will continue to monitor.

## 2016-05-19 NOTE — Progress Notes (Signed)
Referral to Financial counselor. [facility]. Kathleen DonningAlesia Jennavie Martinek RN CCM Case Mgmt phone [phone]

## 2016-05-19 NOTE — Consult Note (Signed)
Physical Medicine and Rehabilitation Consult   Reason for Consult: RUE weakness and difficulty speaking.  Referring Physician:  Dr. Deirdre Priest.    HPI: Kathleen Larson is a 50 y.o. female with history of HTN--medication non-compliance (takes meds 3 X week) who was admitted on 05/16/16 with right hand numbness that progressed thorough the day, slurred speech and elevated BP-193/122.  She was found to have RUE weakness and CT head negative for acute changes. 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 MRA brain showed extensive atheromatous irregularity with stenosis throughout the ACA and PCA bilaterally but no occlusion. 2D echo revealed EF 60-65% with no wall abnormality. Carotid dopplers without significant stenosis. 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 Patient declined participation in stroke AF trial. Patient with decrease in proprioception RUE with fine motor deficits and anomia. CIR recommended for follow up therapy.    Patient reports dizziness is better today and she has been ambulating to bathroom without difficulty. Has extensive family who plans to provide supervision after discharge.    Review of Systems  HENT: Negative for hearing loss.   Eyes: Negative for blurred vision and double vision.  Respiratory: Negative for cough and shortness of breath.   Cardiovascular: Negative for chest pain and palpitations.  Gastrointestinal: Positive for heartburn. Negative for vomiting and diarrhea.  Genitourinary: Negative for dysuria and frequency.  Musculoskeletal: Negative for myalgias and joint pain.  Skin: Negative for itching and rash.  Neurological: Positive for sensory  change and speech change. Negative for dizziness and headaches.  All other systems reviewed and are negative.     Past Medical History  Diagnosis Date  . Allergy   . Hypertension   . GERD (gastroesophageal reflux disease)     Past Surgical History  Procedure Laterality Date  . Tubal ligation     Family History  Problem Relation Age of Onset  . Asthma Daughter   . Cancer Maternal Grandmother   . Stroke Mother     52 yo    Social History:  Lives alone. Works for [employer]. She reports that she has never smoked. She does not have any smokeless tobacco history on file. She reports that she drinks alcohol occasionally. She reports that she does not use illicit drugs.    Allergies  Allergen Reactions  . Shrimp [Shellfish Allergy] Swelling    Of lips    Medications Prior to Admission  Medication Sig Dispense Refill  . Aspirin-Salicylamide-Caffeine (BC HEADACHE POWDER PO) Take 1 packet by mouth every 8 (eight) hours as needed (pain).    Marland Kitchen lisinopril-hydrochlorothiazide (PRINZIDE,ZESTORETIC) 10-12.5 MG tablet Take 1 tablet by mouth daily.    Marland Kitchen amLODipine (NORVASC) 10 MG tablet Take 1 tablet (10 mg total) by mouth daily. (Patient not taking: Reported on 05/16/2016) 30 tablet 2  . benazepril-hydrochlorthiazide (LOTENSIN HCT) 20-25 MG per tablet Take 1 tablet by mouth daily. (Patient not taking: Reported on 05/16/2016) 90 tablet 1  . norgestimate-ethinyl estradiol (ORTHO-CYCLEN,SPRINTEC,PREVIFEM) 0.25-35 MG-MCG tablet Take 1 tablet by mouth daily. (Patient not taking: Reported on 05/16/2016) 1 Package 1    Home: Home Living Family/patient expects to be discharged to:: Private residence Living Arrangements: Alone Available Help at Discharge: Family, Available 24  hours/day Type of Home: Apartment Home Access: Stairs to enter Entrance Stairs-Number of Steps: 1 Entrance Stairs-Rails: None Home Layout: One level Bathroom Shower/Tub: Tub/shower unit, Engineer, building servicesCurtain Bathroom Toilet:  Standard Home Equipment: None Additional Comments: pt works at [employer], lots of lifting  Lives With: Alone  Functional History: Prior Function Level of Independence: Independent Comments: pt reports that daughter can stay with her at night and sister can be with her in the day and take her to appts  Functional Status:  Mobility: Bed Mobility Overal bed mobility: Modified Independent Bed Mobility: Supine to Sit Supine to sit: Modified independent (Device/Increase time) (Used hand rail for assistance) General bed mobility comments: Pt up in recliner upon my arrival Transfers Overall transfer level: Modified independent (use bed rail for assistance) Equipment used: 1 person hand held assist Transfers: Sit to/from Stand Sit to Stand: Modified independent (Device/Increase time) (took two attempts to rise from sitting to standing) General transfer comment: increased time to get into full standing position and pt with decreased control stand>sit as well--"plops" Ambulation/Gait Ambulation/Gait assistance: Min guard Ambulation Distance (Feet): 500 Feet Gait Pattern/deviations: WFL(Within Functional Limits) General Gait Details: guarded gait with decreased ability to change pace, one LOB to right with turning, widened stance Gait velocity: decreased Gait velocity interpretation: Below normal speed for age/gender Stairs: Yes Stairs assistance: Min guard Stair Management: One rail Right, One rail Left, Alternating pattern, Step to pattern Number of Stairs: 3 General stair comments: Pt. decended the stairs with a step to pattern with hand on the L rail for support. She ascended with more dificulty but did use a reciprocal gait pattern.     ADL: ADL Overall ADL's : Needs assistance/impaired Eating/Feeding: Set up, Sitting Eating/Feeding Details (indicate cue type and reason): Having to use her left hand as dominate hand now due to decreased use of RUE Grooming: Moderate assistance,  Sitting Upper Body Bathing: Minimal assitance, Sitting Upper Body Bathing Details (indicate cue type and reason): predominately using LUE, RUE can do gross movements but decreased proprioception makes really have to focus visually when using her RUE Lower Body Bathing: Minimal assistance, Sit to/from stand Upper Body Dressing : Moderate assistance, Sitting Lower Body Dressing: Moderate assistance (min A sit<>stand) Toilet Transfer: Minimal assistance, Ambulation, Regular Toilet, Grab bars (HHA) Toileting- Clothing Manipulation and Hygiene: Moderate assistance (min A sit<>stand)  Cognition: Cognition Overall Cognitive Status: Impaired/Different from baseline Arousal/Alertness: Awake/alert Orientation Level: Oriented to person, Oriented to place, Oriented to time, Oriented to situation Attention: Selective Selective Attention: Impaired Selective Attention Impairment: Verbal basic Memory: Impaired Memory Impairment: Other (comment) (working memory) Awareness: Impaired Awareness Impairment: Intellectual impairment, Emergent impairment, Anticipatory impairment Problem Solving: Impaired Problem Solving Impairment: Functional basic Safety/Judgment: Impaired Cognition Arousal/Alertness: Awake/alert Behavior During Therapy: WFL for tasks assessed/performed Overall Cognitive Status: Impaired/Different from baseline Area of Impairment: Safety/judgement Safety/Judgement: Decreased awareness of safety General Comments: pt states that right LE feels the same as left and that her balance feels normal but then when deficits are pointed out, she agrees that she is having trouble   Blood pressure 172/99, pulse 70, temperature 98.2 F (36.8 C), temperature source Oral, resp. rate 20, height 5\' 4"  (1.626 m), weight 94.8 kg (208 lb 15.9 oz), last menstrual period 10/14/2012, SpO2 100 %. Physical Exam  Nursing note and vitals reviewed. Constitutional: She is oriented to person, place, and time. She  appears well-developed and well-nourished.  HENT:  Head: Normocephalic and atraumatic.  Eyes: Conjunctivae and EOM are normal. Pupils are equal, round, and reactive  to light.  Neck: Normal range of motion. Neck supple.  Cardiovascular: Normal rate and regular rhythm.   Respiratory: Effort normal and breath sounds normal. No stridor.  GI: Soft. Bowel sounds are normal. She exhibits no distension. There is no tenderness.  Musculoskeletal: She exhibits no edema or tenderness.  Neurological: She is alert and oriented to person, place, and time.  Speech with mild ataxia.  Able to follow commands without difficulty.  Sensation diminished to light touch RUE Motor: RUE: 3-/5 proximal to distal RLE: 4+/5 proximal to distal LUE/LLE: 5/5 proximal to distal DTRs brisk throughout  Skin: Skin is warm and dry.  Psychiatric: She has a normal mood and affect. Her behavior is normal.    Results for orders placed or performed during the hospital encounter of 05/16/16 (from the past 24 hour(s))  Basic metabolic panel     Status: Abnormal   Collection Time: 05/19/16  8:27 AM  Result Value Ref Range   Sodium 139 135 - 145 mmol/L   Potassium 3.8 3.5 - 5.1 mmol/L   Chloride 107 101 - 111 mmol/L   CO2 26 22 - 32 mmol/L   Glucose, Bld 108 (H) 65 - 99 mg/dL   BUN 9 6 - 20 mg/dL   Creatinine, Ser 1.61 0.44 - 1.00 mg/dL   Calcium 9.2 8.9 - 09.6 mg/dL   GFR calc non Af Amer >60 >60 mL/min   GFR calc Af Amer >60 >60 mL/min   Anion gap 6 5 - 15   No results found.  Assessment/Plan: Diagnosis: L-PLIC infarct Labs and images independently reviewed.  Records reviewed and summated above. Stroke: Continue secondary stroke prophylaxis and Risk Factor Modification listed below:   Antiplatelet therapy:   Blood Pressure Management:  Continue current medication with prn's with permisive HTN per primary team Statin Agent:   Pre-diabetes management:   Right sided hemiparesis: fit for orthosis to prevent  contractures (resting hand splint for day, wrist cock up splint at night  Motor recovery: Fluoxetine  1. Does the need for close, 24 hr/day medical supervision in concert with the patient's rehab needs make it unreasonable for this patient to be served in a less intensive setting? Potentially  2. Co-Morbidities requiring supervision/potential complications: HTN (monitor and provide prns in accordance with increased physical exertion and pain), medication non-compliance (cont to encourage and stress importance of meds) 3. Due to safety, skin/wound care, disease management and patient education, does the patient require 24 hr/day rehab nursing? Potentially 4. Does the patient require coordinated care of a physician, rehab nurse, PT (1-2 hrs/day, 5 days/week), OT (1-2 hrs/day, 5 days/week) and SLP (1-2 hrs/day, 5 days/week) to address physical and functional deficits in the context of the above medical diagnosis(es)? Potentially Addressing deficits in the following areas: balance, endurance, locomotion, strength, transferring, bathing, dressing, feeding, grooming, toileting, speech and psychosocial support 5. Can the patient actively participate in an intensive therapy program of at least 3 hrs of therapy per day at least 5 days per week? Yes 6. The potential for patient to make measurable gains while on inpatient rehab is good 7. Anticipated functional outcomes upon discharge from inpatient rehab are modified independent and supervision  with PT, modified independent and supervision with OT, independent and modified independent with SLP. 8. Estimated rehab length of stay to reach the above functional goals is: 7-11 days. 9. Does the patient have adequate social supports and living environment to accommodate these discharge functional goals? Yes 10. Anticipated D/C setting: Home 11. Anticipated  post D/C treatments: HH therapy and Home excercise program 12. Overall Rehab/Functional Prognosis:  good  RECOMMENDATIONS: This patient's condition is appropriate for continued rehabilitative care in the following setting: CIR Patient has agreed to participate in recommended program. Yes Note that insurance prior authorization may be required for reimbursement for recommended care.  Comment: Rehab Admissions Coordinator to follow up.  Maryla Morrow, MD 05/19/2016

## 2016-05-19 NOTE — Progress Notes (Signed)
Occupational Therapy Treatment Patient Details Name: Kathleen Larson Sunday MRN: [id number] DOB: [birth year] Today's Date: 05/19/2016    History of present illness Kathleen Larson Breit is a 50 y.o. female presenting with right hand numbness, weakness and slurred speach . PMH is significant for HTN, HLD, constipation. MRI showed ischemic infarct of the posterior limb of the left internal capsule   OT comments  This 50 yo female admitted with above presents to acute OT making progress with RUE (slow) and basic ADLs. She will continue to benefit from acute OT with follow up OT on CIR to give her the best chance at recovery.  Follow Up Recommendations  CIR    Equipment Recommendations  3 in 1 bedside comode;Tub/shower bench       Precautions / Restrictions Precautions Precautions: Fall Restrictions Weight Bearing Restrictions: No       Mobility Bed Mobility     General bed mobility comments: Pt up in recliner upon my arrival  Transfers Overall transfer level: Modified independent   Transfers: Sit to/from Stand Sit to Stand: Modified independent (Device/Increase time)         General transfer comment: had pt use both hands to push up and sit down focusing on the positioning of her RUE        ADL Overall ADL's : Needs assistance/impaired   Eating/Feeding Details (indicate cue type and reason): Had pt work on opening RUE, I placed cup in it, then had her work on closing her right hand tightly around it and then have her LUE hold over her RUE so as to not drop cup and bring to her mouth                     Toilet Transfer: Minimal assistance;Ambulation;Regular Toilet;Grab bars Toilet Transfer Details (indicate cue type and reason): Pt with one LOB while on her way to her bathroom that she self corrected by grabbing hold of bathroom door Toileting- Clothing Manipulation and Hygiene: Min guard;Sit to/from stand Toileting - Clothing Manipulation Details (indicate cue type  and reason): using LUE predominately but trying to use her RUE       General ADL Comments: Encouraged her to wash her thighs and her abdomen with her RUE when she baths tomorrow--actually encouraged her to do this yesterday as well but she admitted that she did not. Sister in room that has been helping her with bathing she is aware as well.                Cognition   Behavior During Therapy: WFL for tasks assessed/performed Overall Cognitive Status: Impaired/Different from baseline Area of Impairment: Safety/judgement;Following commands        Following Commands:  (50% of time if I ask her to move her arm she moves her LUE and I have to remind her to acutually it is the right one that she needs to try and move the way I have asked) Safety/Judgement:  (upon entering room pt getting ready to get up by herself to go to the bathroom ; she did have chair alarm on but she had not gotten to the point where it had been set off yet)            Exercises Other Exercises Other Exercises: I added some more activties/exercises to RUE handout. (raising her RUE as much as she can then helping to raise it more with LUE with hands clasped together--pt did 10 reps with me; lap slides--pt did 10 reps with  me; horizontal lap slides--she 10 reps; open and close her hand while it is laying on her lap--5 reps due to this was really taxing for her)           Pertinent Vitals/ Pain       Pain Assessment: No/denies pain         Frequency Min 3X/week     Progress Toward Goals  OT Goals(current goals can now be found in the care plan section)  Progress towards OT goals: Progressing toward goals  Acute Rehab OT Goals Patient Stated Goal: to back to normal life  Plan Discharge plan remains appropriate       End of Session Equipment Utilized During Treatment:  (1 person HHA)   Activity Tolerance Patient tolerated treatment well   Patient Left in chair;with call bell/phone within reach;with  chair alarm set   Nurse Communication          Time: 1610-9604 OT Time Calculation (min): 25 min  Charges: OT General Charges $OT Visit: 1 Procedure OT Treatments $Self Care/Home Management : 8-22 mins $Therapeutic Exercise: 8-22 mins  Evette Georges [phone] 05/19/2016, 1:48 PM

## 2016-05-19 NOTE — Clinical Social Work Note (Signed)
Per MD request, CSW called and left voicemail for financial counseling regarding short-term disability for patient.  Charlynn CourtSarah Mariona Scholes, CSW [phone]

## 2016-05-20 MED ORDER — ATORVASTATIN CALCIUM 80 MG PO TABS: 80.0000 mg | ORAL_TABLET | Freq: Every day | ORAL | Status: DC

## 2016-05-20 MED ORDER — ASPIRIN 325 MG PO TBEC: 325.0000 mg | DELAYED_RELEASE_TABLET | Freq: Every day | ORAL | Status: DC

## 2016-05-20 MED ORDER — SIMETHICONE 80 MG PO CHEW
80.0000 mg | CHEWABLE_TABLET | Freq: Once | ORAL | Status: AC
  Administered 2016-05-20: 80 mg via ORAL
  Filled 2016-05-20: qty 1

## 2016-05-20 NOTE — Care Management Note (Signed)
Case Management Note  Patient Details  Name: Lysle PearlConstance M Ramdass MRN: [id number] Date of Birth: 1966-02-16  Subjective/Objective:                    Action/Plan: Pt discharged [facility]. CM called the patient and asked if she had received a MATCH letter previously. She states she had not. [facility] to see if [facility] could be faxed to them. Pharmacy personnel stated to [facility]. CM faxed the letter to the number provided: [phone]. CM called and informed Ms Bascom LevelsFrazier that letter was faxed and she could go and pick up her meds. Prior to d/c CM spoke with Lupita Leashonna [facility] and confirmed they would be following [facility]com [facility]raz[facility].  Expected Discharge Date:                  Expected Discharge Plan:  Home w Home Health Services  In-House Referral:  Financial Counselor  Discharge planning Services  CM Consult  Post Acute Care Choice:  Home Health Choice offered to:  Patient  DME Arranged:  N/A DME Agency:  NA  HH Arranged:  PT HH Agency:  [facility]  Status of Service:  Completed, signed off  If discussed at Long Length of Stay Meetings, dates discussed:    Additional Comments:  Kermit BaloKelli F Jette Lewan, RN 05/20/2016, 4:21 PM

## 2016-05-20 NOTE — Progress Notes (Signed)
Family Medicine Teaching Service Daily Progress Note Intern Pager: [phone]  Patient name: Kathleen Larson Medical record number: [id number] Date of birth: Jan 30, 1966 Age: 50 y.o. Gender: female  Primary Care Provider: No primary care provider on file. Consultants: Neurology Code Status: FULL (discussed on admission)  Pt Overview and Major Events to Date:  07/08 Admitted 7/10/ normal EF and duplex US  Assessment and Plan: Kathleen Larson is a 50 y.o. female presenting with right hand numbness, weakness and slurred speach . PMH is significant for HTN, HLD, constipation  # Ischemic CVA:  CT head negative for acute processes. MRI/MRA brain: 1 cm acute ischemic nonhemorrhagic infarct within the inferior aspect of the posterior limb of the left internal capsule, along the major descending white matter tract. Multiple remote lacunar infarcts involving the bilateral corona radiata, right basal ganglia, left thalamus, and right pons. Extensive atheromatous irregularity with stenoses throughout the anterior cerebral arteries and posterior cerebral arteries bilaterally. Tot Chol 277, LDL 195, HDL 59.  Trop 0.04>0.03>0.04>0.03 EKG improved compared to yesterday and more consistent with LVH.  AM EKG pending.  PT recommends CIR.  Patient screened yesterday for eligibility and we can order inpatient rehab consult today.   Spoke with Cardiology regarding EKG changes, felt to be most consistent with LVH, but recommended echo and trending trop to r/o MI.  A1C= 5.9.  PT/OT recommend CIR.  Patient will be discharged home today with home PT.  - cont telemetry - VS per floor protocol  - Neuro c/s in ED, appreciate recs - potassium back up to 3.8 - PT eval; appreciate recs - ASA 81mg  qd  # Accelerated HTN/ Hypertensive emergency: BP largely been in 130s-140ss overnight,181/106 this am.   On Lisinopril-HCTZ outpatient but is not compliant.  Two doses labetalol last night.  - Per neuro: permissive HTN  220/110 in the setting of acute CVA - started HCTZ/lisinopril, DC labetalol standing  # HLD:  - Lipitor 80mg  daily  FEN/GI: NPO until assessed by SLP/ RN bedside Prophylaxis: SCDs for now  Disposition: Discharge home today with home PT Subjective:  Patient reports that she is feeling fine this am.  She is still having weakness in her RUE and hand compared to her left side.  No complaints this AM.  Feels like her grip strength has improved.   Objective: Temp:  [97.9 F (36.6 C)-98.3 F (36.8 C)] 98.3 F (36.8 C) (07/12 0500) Pulse Rate:  [68-81] 68 (07/12 0500) Resp:  [16-26] 18 (07/12 0500) BP: (159-189)/(91-117) 181/106 mmHg (07/12 0500) SpO2:  [94 %-100 %] 100 % (07/12 0500) Physical Exam: General: awake, alert, NAD, sitting up bedside eating breakfast Cardiovascular: RRR, no murmurs, +2DP Respiratory: CTAB, normal WOB on room air Abdomen: obese, soft, NT/ND, +BS Extremities: WWP, no edema Neuro: decreased light touch sensation to RUE, grip strength decreased in right hand compared to left, 3/5 external rotator, 3/5 extensors, 3/5 flexors of RUE, slightly decreased strength with dorsiflexion of right foot but still 5/5.  Follows commands.  Speech still slurred.    Laboratory:  Recent Labs Lab 05/16/16 1100 05/18/16 0528  WBC 5.9 6.4  HGB 15.1* 13.8  HCT 46.1* 42.3  PLT 256 229    Recent Labs Lab 05/16/16 1100 05/18/16 0528 05/19/16 0827  NA 139 138 139  K 3.6 3.0* 3.8  CL 105 105 107  CO2 28 26 26   BUN 8 12 9   CREATININE 0.88 0.91 0.87  CALCIUM 9.5 9.0 9.2  PROT 7.5  --   --  BILITOT 1.4*  --   --   ALKPHOS 105  --   --   ALT 22  --   --   AST 24  --   --   GLUCOSE 118* 140* 108*   Lipid Panel     Component Value Date/Time   CHOL 277* 05/16/2016 1545   TRIG 115 05/16/2016 1545   HDL 59 05/16/2016 1545   CHOLHDL 4.7 05/16/2016 1545   VLDL 23 05/16/2016 1545   LDLCALC 195* 05/16/2016 1545   Cardiac Panel (last 3 results)  Recent Labs   05/17/16 1410  TROPONINI 0.03*   TSH 1.381 A1c: 5.9  Imaging/Diagnostic Tests: Ct Head Wo Contrast  05/16/2016  CLINICAL DATA:  50 year old female with a history of right hand numbness. EXAM: CT HEAD WITHOUT CONTRAST TECHNIQUE: Contiguous axial images were obtained from the base of the skull through the vertex without intravenous contrast. COMPARISON:  None. FINDINGS: Unremarkable appearance of the calvarium without acute fracture or aggressive lesion. Unremarkable appearance of the scalp soft tissues. Unremarkable appearance of the bilateral orbits. Mastoid air cells are clear. No significant paranasal sinus disease No acute intracranial hemorrhage, midline shift, or mass effect. Gray-white differentiation is maintained, without CT evidence of acute ischemia. Unremarkable configuration of the ventricles. Focal hypodensity in the periventricular white matter of the right frontal horn. Focal hypodensity in the left basal ganglia. IMPRESSION: No CT evidence of acute intracranial abnormality. Focal hypodensity in the right periventricular white matter overlying the right frontal horn, as well as within the left basal ganglia. These may represent prior episode of infarction or demyelination. Signed, Yvone Neu. Loreta Ave, DO [facility] Rad[facility] Electronically Signed   By: Gilmer Mor D.O.   On: 05/16/2016 12:24   Mr Brain Wo Contrast  05/16/2016  CLINICAL DATA:  Initial evaluation for right hand numbness with slurred speech. EXAM: MRI HEAD WITHOUT CONTRAST MRA HEAD WITHOUT CONTRAST TECHNIQUE: Multiplanar, multiecho pulse sequences of the brain and surrounding structures were obtained without intravenous contrast. Angiographic images of the head were obtained using MRA technique without contrast. COMPARISON:  Prior CT from earlier the same day. FINDINGS: MRI HEAD FINDINGS Study somewhat limited by susceptibility artifact. Cerebral volume within normal limits for  patient age. Patchy T2/FLAIR hyperintensity within the periventricular and deep white matter both cerebral hemispheres most consistent with chronic small vessel ischemic disease, mild to moderate in nature. Remote lacunar infarcts within the bilateral corona radiata, right basal ganglia, left thalamus, and right pons. Small amount of associated chronic blood products with the remote right pontine infarct. 1 cm focus of restricted diffusion positioned at the inferior aspect of the posterior limb of the left internal capsule along the major descending white matter tract (series 4, image 27). No associated hemorrhage or mass effect. No other evidence for acute infarct. Gray-white matter differentiation otherwise maintained. Major intracranial vascular flow voids are preserved. No acute intracranial hemorrhage. No mass lesion, midline shift, or mass effect. No hydrocephalus. No extra-axial fluid collection. Serpiginous FLAIR signal abnormality overlying multiple cortical sulci within the bilateral frontal regions consistent with artifact. Major dural sinuses are grossly patent. Craniocervical junction normal. Visualized upper cervical spine unremarkable. Pituitary gland normal.  Globes and orbits within normal limits. Paranasal sinuses are clear. No mastoid effusion. Inner ear structures normal. Bone marrow signal intensity within normal limits. Susceptibility artifact overlies the scalp. No definite scalp soft tissue abnormality. MRA HEAD FINDINGS ANTERIOR CIRCULATION: Visualized distal cervical segments of the internal carotid arteries are patent with  antegrade flow. Petrous, cavernous, and supraclinoid segments widely patent bilaterally without flow-limiting stenosis. A1 segments patent. Anterior communicating artery normal. Anterior cerebral arteries opacified to their distal aspects but demonstrate multi focal atheromatous irregularity, right slightly worse than left. In particular, there is a severe focal stenosis  within the right A2 segment (series 7, image 1 await). Superimposed multifocal stenoses present. M1 segments patent without stenosis or occlusion. MCA bifurcations normal. Short-segment moderate left M2 stenosis (series 704, image 4). Atheromatous irregularity within the distal MCA branches bilaterally. POSTERIOR CIRCULATION: Vertebral arteries patent to the vertebrobasilar junction. Posterior inferior cerebral arteries patent, with the left PICA demonstrating multifocal atheromatous irregularity. Basilar artery widely patent. Superior cerebellar arteries patent bilaterally. Both of the posterior cerebral arteries arise from the basilar artery. Extensive multi focal atheromatous irregularity with moderate to severe multi focal stenoses present within the PCAs bilaterally. Changes most severe within the mid left P 2 segment wearing a focal severe stenosis is present (series 706, image 11). PCAs are opacified to their distal aspect. No aneurysm or vascular malformation. IMPRESSION: MRI HEAD IMPRESSION: 1. 1 cm acute ischemic nonhemorrhagic infarct within the inferior aspect of the posterior limb of the left internal capsule, along the major descending white matter tract. 2. Multiple remote lacunar infarcts involving the bilateral corona radiata, right basal ganglia, left thalamus, and right pons. 3. Mild to moderate chronic small vessel ischemic disease. MRA HEAD IMPRESSION: 1. No large or proximal arterial branch occlusion. No correctable stenosis. 2. Extensive atheromatous irregularity with stenoses throughout the anterior cerebral arteries and posterior cerebral arteries bilaterally. 3. Distal small vessel atheromatous irregularity within the MCA branches bilaterally. Electronically Signed   By: Rise Mu M.D.   On: 05/16/2016 20:04   Mr Maxine Glenn Head/brain Wo Cm  05/16/2016  CLINICAL DATA:  Initial evaluation for right hand numbness with slurred speech. EXAM: MRI HEAD WITHOUT CONTRAST MRA HEAD WITHOUT  CONTRAST TECHNIQUE: Multiplanar, multiecho pulse sequences of the brain and surrounding structures were obtained without intravenous contrast. Angiographic images of the head were obtained using MRA technique without contrast. COMPARISON:  Prior CT from earlier the same day. FINDINGS: MRI HEAD FINDINGS Study somewhat limited by susceptibility artifact. Cerebral volume within normal limits for patient age. Patchy T2/FLAIR hyperintensity within the periventricular and deep white matter both cerebral hemispheres most consistent with chronic small vessel ischemic disease, mild to moderate in nature. Remote lacunar infarcts within the bilateral corona radiata, right basal ganglia, left thalamus, and right pons. Small amount of associated chronic blood products with the remote right pontine infarct. 1 cm focus of restricted diffusion positioned at the inferior aspect of the posterior limb of the left internal capsule along the major descending white matter tract (series 4, image 27). No associated hemorrhage or mass effect. No other evidence for acute infarct. Gray-white matter differentiation otherwise maintained. Major intracranial vascular flow voids are preserved. No acute intracranial hemorrhage. No mass lesion, midline shift, or mass effect. No hydrocephalus. No extra-axial fluid collection. Serpiginous FLAIR signal abnormality overlying multiple cortical sulci within the bilateral frontal regions consistent with artifact. Major dural sinuses are grossly patent. Craniocervical junction normal. Visualized upper cervical spine unremarkable. Pituitary gland normal.  Globes and orbits within normal limits. Paranasal sinuses are clear. No mastoid effusion. Inner ear structures normal. Bone marrow signal intensity within normal limits. Susceptibility artifact overlies the scalp. No definite scalp soft tissue abnormality. MRA HEAD FINDINGS ANTERIOR CIRCULATION: Visualized distal cervical segments of the internal carotid  arteries are patent with antegrade flow. Petrous, cavernous,  and supraclinoid segments widely patent bilaterally without flow-limiting stenosis. A1 segments patent. Anterior communicating artery normal. Anterior cerebral arteries opacified to their distal aspects but demonstrate multi focal atheromatous irregularity, right slightly worse than left. In particular, there is a severe focal stenosis within the right A2 segment (series 7, image 1 await). Superimposed multifocal stenoses present. M1 segments patent without stenosis or occlusion. MCA bifurcations normal. Short-segment moderate left M2 stenosis (series 704, image 4). Atheromatous irregularity within the distal MCA branches bilaterally. POSTERIOR CIRCULATION: Vertebral arteries patent to the vertebrobasilar junction. Posterior inferior cerebral arteries patent, with the left PICA demonstrating multifocal atheromatous irregularity. Basilar artery widely patent. Superior cerebellar arteries patent bilaterally. Both of the posterior cerebral arteries arise from the basilar artery. Extensive multi focal atheromatous irregularity with moderate to severe multi focal stenoses present within the PCAs bilaterally. Changes most severe within the mid left P 2 segment wearing a focal severe stenosis is present (series 706, image 11). PCAs are opacified to their distal aspect. No aneurysm or vascular malformation. IMPRESSION: MRI HEAD IMPRESSION: 1. 1 cm acute ischemic nonhemorrhagic infarct within the inferior aspect of the posterior limb of the left internal capsule, along the major descending white matter tract. 2. Multiple remote lacunar infarcts involving the bilateral corona radiata, right basal ganglia, left thalamus, and right pons. 3. Mild to moderate chronic small vessel ischemic disease. MRA HEAD IMPRESSION: 1. No large or proximal arterial branch occlusion. No correctable stenosis. 2. Extensive atheromatous irregularity with stenoses throughout the anterior  cerebral arteries and posterior cerebral arteries bilaterally. 3. Distal small vessel atheromatous irregularity within the MCA branches bilaterally. Electronically Signed   By: Rise Mu M.D.   On: 05/16/2016 20:04     Renne Musca, MD 05/20/2016, 9:53 AM PGY-1, [facility] FPTS Intern pager: [phone], text pages welcome

## 2016-05-20 NOTE — Progress Notes (Signed)
Pt discharged home with family, by car, assessment stable, prescriptions discussed and have been faxed to pharmacy, discharge instructions reviewed, all questions answered. IV removed.tele discontinued  Pt taken by wheelchair to exit. Time of discharge: 1253

## 2016-05-20 NOTE — Progress Notes (Signed)
Pharmacy called, family was picking up meds and said pt was suppose to be started on a blood pressure medicine. rn confirmed with pharmacy and pharmacist that the discharge instructions do say to start taking lisinopril-hctz, verified dose.   rn paged md and confirmed this was the acceptable thing to do. Pt will get 30 tab of lisinopril-hctz, with no refills. Pt has follow up appt on 7/19 with md.

## 2016-05-20 NOTE — Progress Notes (Signed)
Occupational Therapy Treatment Patient Details Name: Kathleen Larson Erler MRN: [id number] DOB: 04-Nov-1966 Today's Date: 05/20/2016    History of present illness Kathleen Larson Weant is a 50 y.o. female presenting with right hand numbness, weakness and slurred speach . PMH is significant for HTN, HLD, constipation. MRI showed ischemic infarct of the posterior limb of the left internal capsule   OT comments  This 50 yo female admitted with above presents to acute OT today with making progress with grooming, UBB/D, and LBB/D as well as using her RUE more as long as concentrates on using it. Will continue to follow.  Follow Up Recommendations  CIR    Equipment Recommendations  Tub/shower seat       Precautions / Restrictions Precautions Precautions: Fall Restrictions Weight Bearing Restrictions: No       Mobility Bed Mobility Overal bed mobility: Modified Independent       Supine to sit: Modified independent (Device/Increase time);HOB elevated        Transfers Overall transfer level: Needs assistance Equipment used: None Transfers: Sit to/from Stand Sit to Stand: Supervision                  ADL Overall ADL's : Needs assistance/impaired       Grooming Details (indicate cue type and reason): wash/dry hands min guard A standing at sink; wash/dry face set up sitting; applying deodorant setup sitting Upper Body Bathing: Supervision/ safety;Set up;Sitting   Lower Body Bathing: Min guard;Sit to/from stand (min S sit<>stand)   Upper Body Dressing : Supervision/safety;Set up;Sitting   Lower Body Dressing: Supervision/safety;Set up;Sit to/from stand   Toilet Transfer: Min guard;Ambulation;Regular Toilet;Grab bars   Toileting- Clothing Manipulation and Hygiene: Supervision/safety (minguard A sit<>stand)     Tub/Shower Transfer Details (indicate cue type and reason): I discussed the need for a shower seat if pt D/C's home--gave sister and pt handout   General ADL  Comments: Pt used her RUE for 75% of her bath today--it took increased time and concentration to do it all. Sister was able to observe pt using her RUE functionally for bathing so that sister can encourage her to keep doing it at home                Cognition   Behavior During Therapy: [facility] for tasks assessed/performed Overall Cognitive Status: Impaired/Different from baseline          Following Commands:  (Pt used her RUE functionally about 75% of time today without cues)                         Pertinent Vitals/ Pain       Pain Assessment: No/denies pain         Frequency Min 3X/week     Progress Toward Goals  OT Goals(current goals can now be found in the care plan section)  Progress towards OT goals: Progressing toward goals     Plan Discharge plan remains appropriate;Equipment recommendations need to be updated    End of Session Equipment Utilized During Treatment:  (none)   Activity Tolerance Patient tolerated treatment well (was slightly fatigued after an ~50 minute ADL session)   Patient Left in chair;with call bell/phone within reach;with chair alarm set           Time: [id number] OT Time Calculation (min): 59 min  Charges: OT General Charges $OT Visit: 1 Procedure OT Treatments $Self Care/Home Management : 53-67 mins  Evette GeorgesLeonard, Ozie Lupe Eva [phone] 05/20/2016, 10:11  AM    

## 2016-05-20 NOTE — Progress Notes (Signed)
I discussed with Dr. Delice Lesch his recommendations for Home with [facility] and 24/7 supervision of family after review of her therapy updates yesterday. I met with pt, her daughter, and a sister at bedside. They are aware of our recommendations. I contacted MD at [phone] and he is aware also of plan, Dr Toya Smothers. I will alert RN CM of plans. [phone]

## 2016-05-20 NOTE — Discharge Instructions (Signed)
Ms. Bascom LevelsFrazier you were admitted to the hospital for stroke management and you have now improved.  You still have some weakness in your right arm and right hand.  We will have home health care following up with you at home.  I have scheduled an outpatient visit with myself on 7/19 at 2:30.  This information is also provided on this document.  You were started on lipitor, lisinopril/HCTZ and aspirin and we recommend that you stop taking your lotensin.  I have called in prescriptions to your pharmacy for these medications.

## 2016-05-20 NOTE — Progress Notes (Signed)
Physical Therapy Treatment Patient Details Name: Kathleen Larson Rise MRN: [id number] DOB: 10/20/1966 Today's Date: 05/20/2016    History of Present Illness Kathleen Larson Degroat is a 50 y.o. female presenting with right hand numbness, weakness and slurred speach . PMH is significant for HTN, HLD, constipation. MRI showed ischemic infarct of the posterior limb of the left internal capsule    PT Comments    Pt progressing with balance and mobility. Practiced ambulation with left sided cane and pt felt safer with this than no AD, plans to purchase from home or borrow from a family member. HEP for balance reviewed. PT will continue to follow.   Follow Up Recommendations  Home health PT     Equipment Recommendations  Gilmer MorCane (pt to purchase from home)    Recommendations for Other Services OT consult;Rehab consult;Speech consult     Precautions / Restrictions Precautions Precautions: Fall Restrictions Weight Bearing Restrictions: No    Mobility  Bed Mobility Overal bed mobility: Modified Independent       Supine to sit: Modified independent (Device/Increase time);HOB elevated     General bed mobility comments: Pt up in recliner upon my arrival  Transfers Overall transfer level: Modified independent Equipment used: None Transfers: Sit to/from Stand Sit to Stand: Independent         General transfer comment: pt sitting and standing without use of UE's today, independent with transfers, including toilet transfer  Ambulation/Gait Ambulation/Gait assistance: Supervision Ambulation Distance (Feet): 350 Feet Assistive device: None;Straight cane Gait Pattern/deviations: Step-through pattern;Wide base of support Gait velocity: decreased Gait velocity interpretation: Below normal speed for age/gender General Gait Details: pt able to show changes in speed slightly more today, has difficulty when speed gets exaggeratedly slow due to mild balance deficits. Practiced ambulation with  cane on left and pt did well with this. Plans to use at home.    Stairs Stairs: Yes Stairs assistance: Modified independent (Device/Increase time) Stair Management: One rail Left;Alternating pattern;Forwards Number of Stairs: 10 General stair comments: pt able to perform alternating pattern with vc's and used step to pattern to strengthen RLE. Pt voiced and demoed understanding  Wheelchair Mobility    Modified Rankin (Stroke Patients Only) Modified Rankin (Stroke Patients Only) Pre-Morbid Rankin Score: No symptoms Modified Rankin: Moderately severe disability     Balance Overall balance assessment: Needs assistance Sitting-balance support: No upper extremity supported;Feet supported Sitting balance-Leahy Scale: Good     Standing balance support: No upper extremity supported Standing balance-Leahy Scale: Fair Standing balance comment: worked on throwing and catching as well as picking object up off floor. Pt with no LOB during exercises. Kept her within BOS, except when throwing             High level balance activites: Backward walking      Cognition Arousal/Alertness: Awake/alert Behavior During Therapy: WFL for tasks assessed/performed Overall Cognitive Status: Impaired/Different from baseline Area of Impairment: Safety/judgement;Following commands       Following Commands:  (Pt used her RUE functionally about 75% of time today without cues)       General Comments: pt states that right LE feels the same as left and that her balance feels normal but then when deficits are pointed out, she agrees that she is having trouble    Exercises      General Comments General comments (skin integrity, edema, etc.): pt not going to CIR, plan is home with HHPT. Discussed HEP program for balance and strengthening so she can begin working if there is  a lag in PT beginning. Also discussed supervision level needed at home      Pertinent Vitals/Pain Pain Assessment: No/denies  pain    Home Living                      Prior Function            PT Goals (current goals can now be found in the care plan section) Acute Rehab PT Goals Patient Stated Goal: to back to normal life PT Goal Formulation: With patient Time For Goal Achievement: 05/31/16 Potential to Achieve Goals: Good Progress towards PT goals: Progressing toward goals    Frequency  Min 4X/week    PT Plan Discharge plan needs to be updated    Co-evaluation             End of Session   Activity Tolerance: Patient tolerated treatment well Patient left: in chair;with call bell/phone within reach;with family/visitor present (pt made mod I by nursing within room)     Time: [id number] PT Time Calculation (min) (ACUTE ONLY): 25 min  Charges:  $Gait Training: 23-37 mins                    G Codes:     Lyanne Co, PT  Acute Rehab Services  [phone]  [place], [place] 05/20/2016, 11:00 AM

## 2016-05-21 ENCOUNTER — Emergency Department (HOSPITAL_COMMUNITY)
Admission: EM | Admit: 2016-05-21 | Discharge: 2016-05-21 | Disposition: A | Discharge status: Home / Self Care | Payer: Medicaid Other | Attending: Emergency Medicine | Admitting: Emergency Medicine

## 2016-05-21 ENCOUNTER — Encounter (HOSPITAL_COMMUNITY): Payer: Self-pay | Admitting: Emergency Medicine

## 2016-05-21 DIAGNOSIS — Z79899 Other long term (current) drug therapy: Secondary | ICD-10-CM | POA: Diagnosis not present

## 2016-05-21 DIAGNOSIS — Z7982 Long term (current) use of aspirin: Secondary | ICD-10-CM | POA: Diagnosis not present

## 2016-05-21 DIAGNOSIS — I160 Hypertensive urgency: Secondary | ICD-10-CM | POA: Insufficient documentation

## 2016-05-21 DIAGNOSIS — I10 Essential (primary) hypertension: Secondary | ICD-10-CM | POA: Diagnosis present

## 2016-05-21 LAB — COMPREHENSIVE METABOLIC PANEL
ALBUMIN: 3.7 g/dL (ref 3.5–5.0)
ALK PHOS: 93 U/L (ref 38–126)
ALT: 21 U/L (ref 14–54)
AST: 24 U/L (ref 15–41)
Anion gap: 8 (ref 5–15)
BILIRUBIN TOTAL: 1.3 mg/dL — AB (ref 0.3–1.2)
BUN: 8 mg/dL (ref 6–20)
CALCIUM: 9.4 mg/dL (ref 8.9–10.3)
CO2: 27 mmol/L (ref 22–32)
CREATININE: 0.87 mg/dL (ref 0.44–1.00)
Chloride: 102 mmol/L (ref 101–111)
GFR calc Af Amer: >60 mL/min (ref >60)
GFR calc non Af Amer: >60 mL/min (ref >60)
GLUCOSE: 88 mg/dL (ref 65–99)
POTASSIUM: 3.8 mmol/L (ref 3.5–5.1)
Sodium: 137 mmol/L (ref 135–145)
TOTAL PROTEIN: 7.4 g/dL (ref 6.5–8.1)

## 2016-05-21 LAB — CBC WITH DIFFERENTIAL/PLATELET
BASOS ABS: 0 10*3/uL (ref 0.0–0.1)
BASOS PCT: 0 %
Eosinophils Absolute: 0.2 10*3/uL (ref 0.0–0.7)
Eosinophils Relative: 4 %
HEMATOCRIT: 46.9 % — AB (ref 36.0–46.0)
HEMOGLOBIN: 15 g/dL (ref 12.0–15.0)
Lymphocytes Relative: 34 %
Lymphs Abs: 2 10*3/uL (ref 0.7–4.0)
MCH: 26.1 pg (ref 26.0–34.0)
MCHC: 32 g/dL (ref 30.0–36.0)
MCV: 81.6 fL (ref 78.0–100.0)
Monocytes Absolute: 0.4 10*3/uL (ref 0.1–1.0)
Monocytes Relative: 7 %
NEUTROS ABS: 3.3 10*3/uL (ref 1.7–7.7)
NEUTROS PCT: 55 %
Platelets: 257 10*3/uL (ref 150–400)
RBC: 5.75 MIL/uL — AB (ref 3.87–5.11)
RDW: 14.6 % (ref 11.5–15.5)
WBC: 5.9 10*3/uL (ref 4.0–10.5)

## 2016-05-21 MED ORDER — HYDRALAZINE HCL 20 MG/ML IJ SOLN
10.0000 mg | INTRAMUSCULAR | Status: AC
  Administered 2016-05-21: 10 mg via INTRAVENOUS
  Filled 2016-05-21: qty 1

## 2016-05-21 NOTE — Discharge Instructions (Signed)
As discussed, your evaluation today has been largely reassuring.  But, it is important that you monitor your condition carefully, and do not hesitate to return to the ED if you develop new, or concerning changes in your condition. ? ?Otherwise, please follow-up with your physician for appropriate ongoing care. ? ?

## 2016-05-21 NOTE — ED Provider Notes (Signed)
CSN: [id number]     Arrival date & time 05/21/16  1326 History   First MD Initiated Contact with Patient 05/21/16 1354     Chief Complaint  Patient presents with  . Hypertension     (Consider location/radiation/quality/duration/timing/severity/associated sxs/prior Treatment) HPI Patient presents with concern of hypertension. She has a notable history of recent admission here for stroke. Patient was discharged yesterday, with new medication regimen. Patient notes that she has been taking all medication as directed, has no new complaints, feels generally well, with no new weakness anywhere, speech difficulty anywhere, headache. However, the patient developed hypertension today, diagnosed by home health nurse here With elevated readings, the patient was referred here for evaluation. No intervention taken for hypertension.  Past Medical History  Diagnosis Date  . Allergy   . Hypertension   . GERD (gastroesophageal reflux disease)    Past Surgical History  Procedure Laterality Date  . Tubal ligation     Family History  Problem Relation Age of Onset  . Asthma Daughter   . Cancer Maternal Grandmother   . Stroke Mother     52 yo   Social History  Substance Use Topics  . Smoking status: Never Smoker   . Smokeless tobacco: None  . Alcohol Use: Yes   OB History    No data available     Review of Systems  Constitutional:       Per HPI, otherwise negative  HENT:       Per HPI, otherwise negative  Respiratory:       Per HPI, otherwise negative  Cardiovascular:       Per HPI, otherwise negative  Gastrointestinal: Negative for vomiting.  Endocrine:       Negative aside from HPI  Genitourinary:       Neg aside from HPI   Musculoskeletal:       Per HPI, otherwise negative  Skin: Negative.   Neurological: Negative for syncope.      Allergies  Shrimp  Home Medications   Prior to Admission medications   Medication Sig Start Date End Date Taking? Authorizing  Provider  amLODipine (NORVASC) 10 MG tablet Take 1 tablet (10 mg total) by mouth daily. Patient not taking: Reported on 05/16/2016 10/21/12   Sherren Mocha, MD  aspirin EC 325 MG EC tablet Take 1 tablet (325 mg total) by mouth daily. 05/20/16   Renne Musca, MD  atorvastatin (LIPITOR) 80 MG tablet Take 1 tablet (80 mg total) by mouth daily at 6 PM. 05/20/16   Renne Musca, MD  lisinopril-hydrochlorothiazide (PRINZIDE,ZESTORETIC) 10-12.5 MG tablet Take 1 tablet by mouth daily.    Historical Provider, MD   BP 208/114 mmHg  Pulse 67  Temp(Src) 99.1 F (37.3 C) (Oral)  Resp 16  Ht  (1.626 m)  Wt 208 lb (94.348 kg)  BMI 35.69 kg/m2  SpO2 100%  LMP 10/14/2012 Physical Exam  Constitutional: She is oriented to person, place, and time. She appears well-developed and well-nourished. No distress.  HENT:  Head: Normocephalic and atraumatic.  Eyes: Conjunctivae and EOM are normal.  Cardiovascular: Normal rate and regular rhythm.   Pulmonary/Chest: Effort normal and breath sounds normal. No stridor. No respiratory distress.  Abdominal: She exhibits no distension.  Musculoskeletal: She exhibits no edema.  Neurological: She is alert and oriented to person, place, and time. No cranial nerve deficit. She exhibits normal muscle tone. Coordination normal.  Skin: Skin is warm and dry.  Psychiatric: She has a normal mood  and affect.  Nursing note and vitals reviewed.   ED Course  Procedures (including critical care time) Labs Review Labs Reviewed  CBC WITH DIFFERENTIAL/PLATELET - Abnormal; Notable for the following:    RBC 5.75 (*)    HCT 46.9 (*)    All other components within normal limits  COMPREHENSIVE METABOLIC PANEL - Abnormal; Notable for the following:    Total Bilirubin 1.3 (*)    All other components within normal limits   I have personally reviewed and evaluated these lab results as part of my medical decision-making.   EKG Interpretation   Date/Time:  Thursday May 21 2016  13:39:21 EDT Ventricular Rate:  66 PR Interval:  154 QRS Duration: 94 QT Interval:  424 QTC Calculation: 444 R Axis:   39 Text Interpretation:  Normal sinus rhythm T wave abnormality Abnormal ekg  Confirmed by Gerhard MunchLOCKWOOD, Lianny Molter  MD [phone]) on 05/21/2016 2:16:06 PM     Chart review notable for discharge yesterday after recent stroke.  3:38 PM On repeat exam the patient has blood pressure 160/80. No new complaints, no complaints. We discussed the idea of home monitoring, returning for concerning changes, otherwise keep a log of blood pressure, and she remained a symptomatically, for changing with her physician next week.  MDM  Patient presents one day after discharge following admission for stroke, now with a symptomatically hypertension. Here, no evidence for recurrent stroke, nor any end organ effects. Patient is a symptomatically for the duration of her emergency department course. Blood pressure diminished appropriately with medication. After a long discussion on blood pressure monitoring, management, patient discharged in stable condition.  Gerhard Munchobert Williams Dietrick, MD 05/21/16 1539

## 2016-05-21 NOTE — ED Notes (Signed)
To ED via private vehicle with c/o high blood pressure-- was seen by home health nurse today and told to come here. Pt was discharged from the hospital yesterday.

## 2016-05-22 NOTE — Discharge Summary (Signed)
Family Medicine Teaching [facility] Discharge Summary  Patient name: TANAYSHA ALKINS Medical record number: [id number] Date of birth: 11/02/1966 Age: 50 y.o. Gender: female Date of Admission: 05/16/2016  Date of Discharge: 05/20/16 Admitting Physician: Carney Living, MD  Primary Care Provider: No PCP Per Patient Consultants: Neuro  Indication for Hospitalization: Stroke, hypertensive emergency  Discharge Diagnoses/Problem List:  Patient Active Problem List   Diagnosis Date Noted  . Medical non-compliance   . Prediabetes   . RUE weakness   . Hyperlipidemia   . Intracranial vascular stenosis   . Stroke (HCC) 05/16/2016  . Accelerated hypertension 05/16/2016  . Hypertensive emergency 05/16/2016  . Stroke (cerebrum) (HCC) 05/16/2016  . Cerebrovascular accident (CVA) (HCC)   . Essential hypertension   . Obesity   . Abnormal EKG   . Hypertension 09/17/2012  . FATIGUE 01/06/2010  . CANDIDIASIS, VAGINAL 03/22/2009  . CONSTIPATION 03/22/2009  . HELICOBACTER PYLORI INFECTION, HX OF 01/28/2009  . ALLERGIC RHINITIS 03/02/2008  . ABDOMINAL PAIN 03/02/2008  . HYPERLIPIDEMIA 07/18/2007  . HYPERTENSION 07/12/2007     Disposition: Home with home PT  Discharge Condition: Stable improved  Discharge Exam:   General: awake, alert, NAD, sitting up bedside eating breakfast Cardiovascular: RRR, no murmurs Respiratory: CTAB, normal WOB Abdomen: obese, soft, NT/ND, +BS Extremities: warm and well perfused, no edema or cyanosis Neuro: grip strength decreased in right hand compared to left   Brief Hospital Course:  SENTORIA BRENT is a 50 y.o. female presenting with right hand numbness, weakness and slurred speech . PMH is significant for HTN, HLD, constipation  # Ischemic CVA: Patient presented with right hand numbness, weakness and slurred speech outside the window for tPA.  CT head was negative for acute processes. MRI/MRA brain showed 1 cm acute ischemic  nonhemorrhagic infarct within the inferior aspect of the posterior limb of the left internal capsule, along the major descending white matter tract. Multiple remote lacunar infarcts involving the bilateral corona radiata, right basal ganglia, left thalamus, and right pons. Extensive atheromatous irregularity with stenoses throughout the anterior cerebral arteries and posterior cerebral arteries bilaterally. The patients risk stratification labs were markedly elevated with a total cholesterol of 277, LDL of 191, HDL 59 and troponins continued to trend downward from 0.04>0.03>0.04>0.03.  EKG initially showed some LVH, but improved during her hospitalization. Patient see by PT/OT and did not qualify for charity OT, but was sent home with home PT.  She left on ASA, lisinopril/HCTZ and was recommended to stop taking lotensin.   # Accelerated HTN/ Hypertensive emergency: Patient presented with hypertensive emergency in the ED and was being managed with permissive hypertension 2/2 CVA, she was initially cleviorex and pressure goal was to keep within permissive guidelines, 220/110.  Eventually she was given labetalol and then her home medications of amlodipine and lotensin.   She left on ASA, lisinopril/HCTZ and was recommended to stop taking lotensin.    Issues for Follow Up:  1.  Follow up at stroke clinic. They will contact patient with appointment. 2.  Follow up with primary care doctor in clinic to try to manage blood pressure.  Has an appointment with myself on 05/27/16 at 2:30PM  Significant Procedures: None  Significant Labs and Imaging:   Recent Labs Lab 05/16/16 1100 05/18/16 0528 05/21/16 1349  WBC 5.9 6.4 5.9  HGB 15.1* 13.8 15.0  HCT 46.1* 42.3 46.9*  PLT 256 229 257    Recent Labs Lab 05/16/16 1100 05/18/16 0528 05/19/16 0827 05/21/16 1349  NA  139 138 139 137  K 3.6 3.0* 3.8 3.8  CL 105 105 107 102  CO2 28 26 26 27   GLUCOSE 118* 140* 108* 88  BUN 8 12 9 8   CREATININE 0.88  0.91 0.87 0.87  CALCIUM 9.5 9.0 9.2 9.4  MG  --  1.9  --   --   ALKPHOS 105  --   --  93  AST 24  --   --  24  ALT 22  --   --  21  ALBUMIN 3.8  --   --  3.7     Results/Tests Pending at Time of Discharge: None  Discharge Medications:    Medication List    STOP taking these medications        amLODipine 10 MG tablet  Commonly known as:  NORVASC     BC HEADACHE POWDER PO     benazepril-hydrochlorthiazide 20-25 MG tablet  Commonly known as:  LOTENSIN HCT     norgestimate-ethinyl estradiol 0.25-35 MG-MCG tablet  Commonly known as:  ORTHO-CYCLEN,SPRINTEC,PREVIFEM      TAKE these medications        aspirin 325 MG EC tablet  Take 1 tablet (325 mg total) by mouth daily.     atorvastatin 80 MG tablet  Commonly known as:  LIPITOR  Take 1 tablet (80 mg total) by mouth daily at 6 PM.     lisinopril-hydrochlorothiazide 10-12.5 MG tablet  Commonly known as:  PRINZIDE,ZESTORETIC  Take 1 tablet by mouth daily.        Discharge Instructions: Please refer to Patient Instructions section of EMR for full details.  Patient was counseled important signs and symptoms that should prompt return to medical care, changes in medications, dietary instructions, activity restrictions, and follow up appointments.   Follow-Up Appointments: Follow-up Information    Follow [facility] with [facility].   Specialty:  Neurology   Why:  Stroke Clinic, Office will call you with appointment date & time, for appt with Nurse Practitioner for Dr. Marchelle FolksSethi   Contact information:   [address] [place] [id number] [phone]      Follow up with Renne Muscaaniel L Channah Godeaux, MD. Go on 05/27/2016.   Why:  @ 2:30 PM for hospital follow up.    Contact information:   [address] [place] [address] [phone]       Renne Muscaaniel L Nydia Ytuarte, MD 05/22/2016, 12:23 PM PGY-1, [facility]

## 2016-05-26 ENCOUNTER — Telehealth: Payer: Self-pay | Admitting: *Deleted

## 2016-05-26 NOTE — Telephone Encounter (Signed)
Kathleen SackKendra, Physical Therapist with [facility] called to report blood pressure of 185/120.  Patient is not having any symptoms.  Patient is recently discharged from hospital due to stroke.  Patient was sent to ED last week for blood pressure of 200s/100s.  Patient has follow up appt tomorrow 05/27/16 at 2:30 with Dr. [name].  Will forward chart to Dr. [name].  Clovis PuMartin, Marciana Uplinger L, RN

## 2016-05-27 ENCOUNTER — Ambulatory Visit (INDEPENDENT_AMBULATORY_CARE_PROVIDER_SITE_OTHER): Payer: Self-pay | Admitting: Family Medicine

## 2016-05-27 ENCOUNTER — Encounter: Payer: Self-pay | Admitting: Family Medicine

## 2016-05-27 VITALS — Wt 217.0 lb

## 2016-05-27 DIAGNOSIS — I10 Essential (primary) hypertension: Secondary | ICD-10-CM | POA: Insufficient documentation

## 2016-05-27 MED ORDER — LISINOPRIL-HYDROCHLOROTHIAZIDE 20-12.5 MG PO TABS: 1.0000 | ORAL_TABLET | Freq: Every day | ORAL | Status: DC

## 2016-05-27 NOTE — Patient Instructions (Signed)
Thank you for coming in to see me today.  Today we discussed the importance of blood pressure medication and compliance.  We will increase your lisinopril/HCTZ medication from 10-12.5 to 20-12.5.  You will continue to take the medication just one time per day.  I would also like you to take your blood pressure daily and keep track of those values and to bring those in with you to your follow up visit.  If you could come back in one week for a blood pressure check that would be great.  We can assess any need to change medication from there.   Great you see you.  Take care, Serafina Royalsan warden, M.D.

## 2016-05-27 NOTE — Progress Notes (Signed)
    Subjective:  Kathleen Larson is a 50 y.o. female who [facility] for a hospital follow up and to discuss her blood pressure.   HPI: Patient is coming in today for a hospital follow up from a recent stroke that she had affecting her L MCA leaving her with R hand and speaking deficits.  She is currently receiving physical therapy 2x/week.  She originally presented to the ED with hypertensive emergency and was treated for permissive hypertension throughout her admission.  She was discharged on lisinopril/HCTZ 10-12.5 mg and presented again to the ED in hypertensive urgency. Today her BP was 181/97 and she claims to be taking her medication properly.  She denies any chest pain, SOB, changes in vision.   ROS: per HPI   Objective:  Physical Exam: Wt 217 lb (98.431 kg)  LMP 10/14/2012  Gen: pleasant middle aged woman in NAD, resting comfortably CV: RRR with no murmurs appreciated Pulm: NWOB, CTAB with no crackles, wheezes, or rhonchi GI: Normal bowel sounds present. Soft, Nontender, Nondistended. MSK: no edema, cyanosis, or clubbing noted Skin: warm, dry Neuro: grossly normal, moves all extremities Psych: Normal affect and thought content  No results found for this or any previous visit (from the past 72 hour(s)).   Assessment/Plan:  Essential hypertension, benign Patient has history of uncontrolled hypertension with recent admission for hypertensive emergency and stroke last week.  She had another visit to the ED for hypertensive urgency and now presented with BP 181/97 on lisinopril/HCTZ 10-12.5mg .   - start on lisinopril/HCTZ 20-12.5 mg daily and will follow up with Tamika in BP clinic next week. Will reassess at the time.

## 2016-05-27 NOTE — Assessment & Plan Note (Signed)
Patient has history of uncontrolled hypertension with recent admission for hypertensive emergency and stroke last week.  She had another visit to the ED for hypertensive urgency and now presented with BP 181/97 on lisinopril/HCTZ 10-12.5mg .   - start on lisinopril/HCTZ 20-12.5 mg daily and will follow up with Tamika in BP clinic next week. Will reassess at the time.

## 2016-05-28 ENCOUNTER — Telehealth: Payer: Self-pay | Admitting: *Deleted

## 2016-05-28 NOTE — Telephone Encounter (Signed)
Enrique SackKendra, Physical Therapist with [facility] called to request verbal order for occupational therapy to evaluate patient's hand.  Please call [phone].  Clovis PuMartin, Tamika L, RN

## 2016-06-03 ENCOUNTER — Ambulatory Visit (INDEPENDENT_AMBULATORY_CARE_PROVIDER_SITE_OTHER): Payer: Self-pay | Admitting: *Deleted

## 2016-06-03 VITALS — BP 180/112 | HR 84

## 2016-06-03 DIAGNOSIS — Z0130 Encounter for examination of blood pressure without abnormal findings: Secondary | ICD-10-CM

## 2016-06-03 DIAGNOSIS — I10 Essential (primary) hypertension: Secondary | ICD-10-CM

## 2016-06-03 DIAGNOSIS — Z136 Encounter for screening for cardiovascular disorders: Secondary | ICD-10-CM

## 2016-06-03 NOTE — Progress Notes (Signed)
   Patient in nurse clinic for blood pressure check.  Patient reported taking her medications as prescribed.  Pt had home visit yesterday form occupational therapy.  Blood pressure was taken in right arm 192/122.  Patient stated she really had a bad headache yesterday.  She did not take anything, other than go lay down.  Pt denied any chest pain, visual changes, dizziness, SOB, n/v or numbness/tingling of extremities.  Precept with Dr. Jennette Kettle patient should go to ED today for further blood pressure evaluation.  Patient was requested to be seen tomorrow by PCP, however PCP is booked for tomorrow.  Appt scheduled with Dr. Wende Mott at 1:45 PM 06/04/16.  Advised patient strongly to go to ED.  Advised patient to call EMS if she develop any of the symptoms listed above.  Patient verbalized understanding.  Patient stated if she went they would just give her a high dose of medication and send her home anyway.  Advised patient again that she should go to ED for further evaluation.  Will forward to PCP and Dr. Wende Mott.  Clovis Pu, RN   Today's Vitals   06/03/16 1400 06/03/16 1420  BP: (!) 170/110 (!) 180/112  Pulse: 81 84  SpO2: 98% 98%  PainSc: 0-No pain

## 2016-06-04 ENCOUNTER — Ambulatory Visit (INDEPENDENT_AMBULATORY_CARE_PROVIDER_SITE_OTHER): Payer: Self-pay | Admitting: Family Medicine

## 2016-06-04 ENCOUNTER — Encounter: Payer: Self-pay | Admitting: Family Medicine

## 2016-06-04 DIAGNOSIS — I10 Essential (primary) hypertension: Secondary | ICD-10-CM

## 2016-06-04 MED ORDER — LISINOPRIL-HYDROCHLOROTHIAZIDE 20-12.5 MG PO TABS: 2.0000 | ORAL_TABLET | Freq: Every day | ORAL | 1 refills | Status: DC

## 2016-06-04 NOTE — Progress Notes (Signed)
   HPI  CC: HTN f/u Patient is here for follow-up of blood pressure. She states that she's been doing well. She takes her medications every morning as directed. She denies any issues at this time. She states she was experiencing some headaches but has not had this for a couple days. No issues with vision changes. No weakness or falls. Patient is very concerned about her blood pressure and is very eager to have this under control as soon as possible.  Review of Systems   See HPI for ROS. All other systems reviewed and are negative.  CC, SH/smoking status, and VS noted  Objective: BP (!) 194/102   Pulse 92   Temp 98.4 F (36.9 C) (Oral)   Wt 217 lb 12.8 oz (98.8 kg)   LMP 10/14/2012   BMI 37.39 kg/m  Gen: NAD, alert, cooperative, and pleasant. HEENT: NCAT, EOMI, PERRL CV: RRR, no murmur Resp: CTAB, no wheezes, non-labored Neuro: Alert and oriented, Speech clear  Assessment and plan:  Hypertension Patient is here for follow-up on blood pressure. Blood pressure today continues to be elevated. Initial value was 194/115. Repeat BP was not improved.  - Increasing lisinopril/chlorothiazide combination medication from 20 mg/12.5 mg to 40mg /25 mg QD. - Follow-up 1-2 weeks with PCP - Recheck BMP at follow-up visit to assess kidney function/response to medications.   Meds ordered this encounter  Medications  . lisinopril-hydrochlorothiazide (ZESTORETIC) 20-12.5 MG tablet    Sig: Take 2 tablets by mouth daily.    Dispense:  60 tablet    Refill:  1     Kathee Delton, MD,MS,  PGY3 06/04/2016 7:20 PM

## 2016-06-04 NOTE — Patient Instructions (Addendum)
It was a pleasure seeing you today in our clinic. Today we discussed your blood pressure. Here is the treatment plan we have discussed and agreed upon together:   - I've increased dosage of your blood pressure medications. I would like for you to take 2 tablets daily. Take both of these tablets at the same time and he may want to consider taking these medications at night. - Follow-up in our office in 1-2 weeks with Dr. Myrtie Soman. At that time we will likely want to get some blood tests on you.

## 2016-06-04 NOTE — Assessment & Plan Note (Signed)
Patient is here for follow-up on blood pressure. Blood pressure today continues to be elevated. Initial value was 194/115. Repeat BP was not improved.  - Increasing lisinopril/chlorothiazide combination medication from 20 mg/12.5 mg to 40mg /25 mg QD. - Follow-up 1-2 weeks with PCP - Recheck BMP at follow-up visit to assess kidney function/response to medications.

## 2016-06-11 ENCOUNTER — Ambulatory Visit (INDEPENDENT_AMBULATORY_CARE_PROVIDER_SITE_OTHER): Payer: Self-pay | Admitting: Family Medicine

## 2016-06-11 ENCOUNTER — Encounter: Payer: Self-pay | Admitting: Family Medicine

## 2016-06-11 VITALS — BP 149/87 | HR 85 | Temp 98.3°F | Wt 217.4 lb

## 2016-06-11 DIAGNOSIS — I10 Essential (primary) hypertension: Secondary | ICD-10-CM

## 2016-06-11 DIAGNOSIS — Z79899 Other long term (current) drug therapy: Secondary | ICD-10-CM

## 2016-06-11 NOTE — Progress Notes (Signed)
   HPI  CC: Hypertension follow-up Patient is here for a follow-up on her hypertension. She was last seen by me in the clinic. At that time I increased her blood pressure medications and asked her to follow-up within 1-2 weeks. She is here today with significantly improved blood pressures and states that she feels well. She states that initially she experienced a mild headache after initiating the new medication regimen but this has since resolved. She has no concerns or issues at this time. She denies any shortness of breath, chest pain, dizziness, lightheadedness, vision changes, or peripheral edema.  Review of Systems   See HPI for ROS. All other systems reviewed and are negative.  CC, SH/smoking status, and VS noted  Objective: BP (!) 149/87 (BP Location: Left Arm, Patient Position: Sitting, Cuff Size: Large)   Pulse 85   Temp 98.3 F (36.8 C) (Oral)   Wt 217 lb 6.4 oz (98.6 kg)   LMP 10/14/2012   BMI 37.32 kg/m  Gen: NAD, alert, cooperative, and pleasant. HEENT: NCAT, EOMI, PERRL CV: RRR, no murmur Resp: CTAB, no wheezes, non-labored Ext: No edema, warm Neuro: Alert and oriented, Speech clear, No gross deficits  Assessment and plan:  Hypertension Improved: Patient was here for follow-up on her hypertension. She states that she has been doing well. Today blood pressure was significantly improved. Patient reported good compliance with medications. - Continue increased dose of lisinopril/hydrochlorothiazide medications. - Obtain BMP to assess renal function and potassium. - Follow-up with PCP to ensure continued compliance as well as more aggressive therapy in the future and she is still elevated/high end of normal.   Orders Placed This Encounter  Procedures  . BASIC METABOLIC PANEL WITH GFR    Kathee Delton, MD,MS,  PGY3 06/12/2016 2:54 PM

## 2016-06-11 NOTE — Patient Instructions (Signed)
It was a pleasure seeing you today in our clinic. Today we discussed your blood pressure. Here is the treatment plan we have discussed and agreed upon together:   - I'm very please with how your blood pressures responded to the change in her medications. I believe that at this time we are in a better place than we had been before. - Continue taking your medications as prescribed. - I like to have you follow-up with Dr. Myrtie Soman in 3-6 months.

## 2016-06-12 ENCOUNTER — Telehealth: Payer: Self-pay | Admitting: Family Medicine

## 2016-06-12 LAB — BASIC METABOLIC PANEL WITH GFR
BUN: 16 mg/dL (ref 7–25)
CO2: 27 mmol/L (ref 20–31)
CREATININE: 1.01 mg/dL (ref 0.50–1.05)
Calcium: 9.6 mg/dL (ref 8.6–10.4)
Chloride: 100 mmol/L (ref 98–110)
GFR, EST AFRICAN AMERICAN: 74 mL/min (ref >=60)
GFR, Est Non African American: 64 mL/min (ref >=60)
Glucose, Bld: 117 mg/dL — ABNORMAL HIGH (ref 65–99)
POTASSIUM: 3.8 mmol/L (ref 3.5–5.3)
Sodium: 141 mmol/L (ref 135–146)

## 2016-06-12 NOTE — Assessment & Plan Note (Signed)
Improved: Patient was here for follow-up on her hypertension. She states that she has been doing well. Today blood pressure was significantly improved. Patient reported good compliance with medications. - Continue increased dose of lisinopril/hydrochlorothiazide medications. - Obtain BMP to assess renal function and potassium. - Follow-up with PCP to ensure continued compliance as well as more aggressive therapy in the future and she is still elevated/high end of normal.

## 2016-06-12 NOTE — Telephone Encounter (Signed)
Windell Moulding from [facility] called because she visits the patient and noticed that her BP has been being high in the morning. Today the patient stated she takes her BP medication at 8 AM everyday. When she took her BP at 9 AM today it was 176/118 and then 45 minutes later it was 168/110. She said that it has been running this way every morning for while now. Please call Windell Moulding with any questions. jw

## 2016-06-18 ENCOUNTER — Ambulatory Visit (INDEPENDENT_AMBULATORY_CARE_PROVIDER_SITE_OTHER): Payer: Self-pay | Admitting: Internal Medicine

## 2016-06-18 ENCOUNTER — Encounter: Payer: Self-pay | Admitting: Internal Medicine

## 2016-06-18 VITALS — BP 158/110 | HR 76 | Temp 97.4°F | Ht 64.0 in | Wt 217.0 lb

## 2016-06-18 DIAGNOSIS — I639 Cerebral infarction, unspecified: Secondary | ICD-10-CM

## 2016-06-18 DIAGNOSIS — I10 Essential (primary) hypertension: Secondary | ICD-10-CM

## 2016-06-18 DIAGNOSIS — M542 Cervicalgia: Secondary | ICD-10-CM

## 2016-06-18 MED ORDER — AMLODIPINE BESYLATE 5 MG PO TABS: 5.0000 mg | ORAL_TABLET | Freq: Every day | ORAL | 0 refills | Status: DC

## 2016-06-18 NOTE — Patient Instructions (Signed)
Ms. Bascom LevelsFrazier,  Please make an appointment in 1 week to follow-up blood pressure. I have added amlodipine 5 mg to your blood pressure regimen.  You will get a call about speech therapy.  Please try heat and tylenol for your neck.  Best, Dr. Sampson GoonFitzgerald

## 2016-06-18 NOTE — Progress Notes (Signed)
[facility] Progress Note  Subjective:  Kathleen Larson is a 50-y/o female with history of recent ischemic stroke in July 2017 who presents for neck pain.  Left neck pain: - Has been present for about 5 days, initially thought she had slept in a weird position but discomfort continued - Describes as a sharp pain affecting the left side of her neck but does not extend into her back or arm - Turning her head and trying to sleep seem to make pain worse - Has not taken anything for pain because she was [facility] not to [facility] but has been sleeping with a neck pillow ROS: Denies vision changes, denies headaches  HTN: - Has been "up and down" with rechecks - Denies any trouble taking lisinopril-HCTZ 20-12.5, 2 tablets daily (takes in morning) but that pills have made her go to the bathroom more frequently  S/p Stroke: - Is receiving home PT and services through [facility] - Feels that she is still having difficulty with her speech with continued slurring - Right-sided weakness has improved some, per patient - Taking aspirin 325 daily and lipitor 80 mg  - Was found to have prediabetes during hospitalization for stroke with hgb A1c of 5.9  Social: Never smoker  Objective: Blood pressure (!) 158/110, pulse 76, temperature 97.4 F (36.3 C), temperature source Oral, height 5\' 4"  (1.626 m), weight 217 lb (98.4 kg), last menstrual period 10/14/2012. Initial BP reading was 179/116.   Constitutional: Obese, pleasant female in NAD Cardiovascular: RRR, S1, S2, no m/r/g.  Pulmonary/Chest: Effort normal and breath sounds normal. No respiratory distress.  Musculoskeletal: Mild TTP over upper trapezius of left neck. FROM of neck. No midline spine tenderness. Decreased hand grip of left hand 4+/5- compared to 5/5 of right hand. Decreased strength with abduction of right arm 5-/5 compared to 5 on the left. LE strength intact.  Neurological: AOx3, CNII-XII intact Skin:  Skin is warm and dry. No rash noted. No erythema.  Psychiatric: Normal mood and affect.  Vitals reviewed  Assessment/Plan: Neck pain on left side - Exam c/w trapezius muscle strain. No new neurological deficits.  - Recommended heat therapy and tylenol  - If does not improve by follow-up could consider flexeril  Essential hypertension, benign - Add amlodipine 5 mg daily. Anticipate needing to increase to 10 mg daily at next visit. - Follow-up in 1 week.  - Reviewed recent labs, which showed SCr and K WNL.   Cerebrovascular accident (CVA) (HCC) - Ordered referral to speech therapy  - Discuss patient's preferences for starting medication versus focusing on lifestyle modification for management of pre-diabetes at follow-up next week  Follow-up in 1 week for blood pressure check with likely need for increase in medication.  Dani GobbleHillary Derrika Ruffalo, MD [facility], PGY-2

## 2016-06-19 DIAGNOSIS — M542 Cervicalgia: Secondary | ICD-10-CM | POA: Insufficient documentation

## 2016-06-19 NOTE — Assessment & Plan Note (Signed)
-   Ordered referral to speech therapy  - Discuss patient's preferences for starting medication versus focusing on lifestyle modification for management of pre-diabetes at follow-up next week

## 2016-06-19 NOTE — Assessment & Plan Note (Signed)
-   Add amlodipine 5 mg daily. Anticipate needing to increase to 10 mg daily at next visit. - Follow-up in 1 week.  - Reviewed recent labs, which showed SCr and K WNL.

## 2016-06-19 NOTE — Assessment & Plan Note (Signed)
-   Exam c/w trapezius muscle strain. No new neurological deficits.  - Recommended heat therapy and tylenol  - If does not improve by follow-up could consider flexeril

## 2016-06-25 NOTE — Progress Notes (Signed)
Subjective:     Patient ID: Kathleen Larson, female   DOB: 09/10/1966, 50 y.o.   MRN: [id number]  HPI Kathleen Larson is a 50yo female presenting today for follow up of Hypertension. - Many visits noted in chart for same. Chart review below:  Hospitalized from 7/8-7/12 for stroke affecting Left MCA resulting in right hand weakness and speech deficits.  05/27/16: Lisinopril/HCTZ increased to 20-12.5mg   06/04/16: Lisinopril/HCTZ increased to 40-25mg   06/11/16: Improvement noted. Continued above dose.  06/18/16: Amlodipine 5mg  added to regimen -Has been checking blood pressure at home daily. Reports average of 140/90s at home. -Has been taking Lisinopril/HCTZ and amlodipine as prescribed. -Denies headaches, chest pain, shortness of breath, blurred vision -Nonsmoker  Review of Systems Per HPI. Other systems negative.    Objective:   Physical Exam  Constitutional: She appears well-developed and well-nourished. No distress.  Cardiovascular: Normal rate and regular rhythm.   No murmur heard. Pulmonary/Chest: Effort normal. No respiratory distress. She has no wheezes.  Psychiatric: She has a normal mood and affect. Her behavior is normal.      Assessment and Plan:     Essential hypertension, benign -Continue checking blood pressure once daily. To bring log to next office visit. -Continue lisinopril/HCTZ. Increase amlodipine to 10 mg daily; may take 2 tablets of the 5 mg amlodipine until completes bottle. -Return precautions given -Return in 2 weeks for nurse visit. Return in one month for PCP visit. -If no improvement at next visit, consider renal imaging.

## 2016-06-26 ENCOUNTER — Encounter: Payer: Self-pay | Admitting: Family Medicine

## 2016-06-26 ENCOUNTER — Ambulatory Visit (INDEPENDENT_AMBULATORY_CARE_PROVIDER_SITE_OTHER): Payer: Self-pay | Admitting: Family Medicine

## 2016-06-26 ENCOUNTER — Ambulatory Visit: Payer: Self-pay | Admitting: Family Medicine

## 2016-06-26 DIAGNOSIS — I10 Essential (primary) hypertension: Secondary | ICD-10-CM

## 2016-06-26 MED ORDER — AMLODIPINE BESYLATE 10 MG PO TABS: 10.0000 mg | ORAL_TABLET | Freq: Every day | ORAL | 3 refills | Status: DC

## 2016-06-26 NOTE — Patient Instructions (Signed)
Thank you so much for coming to visit today! Please take two of the Amlodipine 5mg  to make 10mg  once daily. I have printed a prescription for Amlodipine for you to take to the pharmacy. [facility]. At [facility] it should be free.  Please return in 2 weeks for a nurse visit to check your blood pressure. Please return in one month to see your PCP.  Dr. Caroleen Hammanumley

## 2016-06-26 NOTE — Assessment & Plan Note (Addendum)
-  Continue checking blood pressure once daily. To bring log to next office visit. -Continue lisinopril/HCTZ. Increase amlodipine to 10 mg daily; may take 2 tablets of the 5 mg amlodipine until completes bottle. -Return precautions given -Return in 2 weeks for nurse visit. Return in one month for PCP visit. -If no improvement at next visit, consider renal imaging.

## 2016-06-30 ENCOUNTER — Encounter: Payer: Self-pay | Admitting: Nurse Practitioner

## 2016-06-30 ENCOUNTER — Ambulatory Visit (INDEPENDENT_AMBULATORY_CARE_PROVIDER_SITE_OTHER): Payer: Self-pay | Admitting: Nurse Practitioner

## 2016-06-30 VITALS — BP 142/98 | HR 72 | Ht 64.0 in | Wt 218.8 lb

## 2016-06-30 DIAGNOSIS — E785 Hyperlipidemia, unspecified: Secondary | ICD-10-CM

## 2016-06-30 DIAGNOSIS — R29898 Other symptoms and signs involving the musculoskeletal system: Secondary | ICD-10-CM

## 2016-06-30 DIAGNOSIS — I639 Cerebral infarction, unspecified: Secondary | ICD-10-CM

## 2016-06-30 DIAGNOSIS — E669 Obesity, unspecified: Secondary | ICD-10-CM

## 2016-06-30 DIAGNOSIS — I10 Essential (primary) hypertension: Secondary | ICD-10-CM

## 2016-06-30 NOTE — Patient Instructions (Signed)
Stressed the importance of management of risk factors to prevent further stroke Continue aspirin for secondary stroke prevention Maintain strict control of hypertension with blood pressure goal below 130/90, today's reading 142/98 continue antihypertensive medications Cholesterol with LDL cholesterol less than 70, followed by primary care,  Lipitor HEP to gradually improve strength, eat healthy diet with whole grains,  fresh fruits and vegetables Discussed risk for recurrent stroke/ TIA and answered additional questions Follow-up in 3 months

## 2016-06-30 NOTE — Progress Notes (Signed)
[facility]  PATIENT: Kathleen SchoolConstance D Walkins DOB: 02/22/1966   REASON FOR VISIT: Hospital follow-up for stroke HISTORY FROM: Patient and sister [name]    HISTORY OF PRESENT ILLNESS:Lamira Judie PetitM Bascom LevelsFrazier is an 50 y.o. female with a history of hypertension who developed right hand numbness the day prior to admission while getting ready for work at approximately 5:30 in the morning. The patient states that she did not awake up with the symptom. She decided to go on to work and her deficits progressed throughout the day. On 05/16/16  she did not feel well and presented to the emergency department for further evaluation. The patient did not describe weakness although there is RUE weakness on exam. Her deficits are confined to the right upper extremity.  A CT scan of the head revealed no evidence of acute intracranial abnormality. Focal hypodensity in the right periventricular white matter overlying the right frontal horn, as well as within the left basal ganglia. MRI HEAD Left PLIC infarct likely secondary to small vessel disease Multiple remote lacunar infarcts involving the bilateral corona radiata, right basal ganglia, left thalamus, and right pons.  MRA HEAD extensive intracranial atherosclerosis. Carotid Doppler no significant stenosis. 2-D echo no cardiac source of embolism  LDL 195 Hemoglobin A1c 5.9 No anti-thrombotic prior to admission now on aspirin and Plavix for 3 months The patient reported that she did not take her blood pressure medication this a.m. but denied missing any other doses. In the emergency department her blood pressure was significantly elevated and she was placed on a Cleviprex drip. Her last blood pressure was 193/122. She also had sinus tachycardia with rates in the 120 to 130 range. She denied chest pain or shortness of breath. Family members who accompanied the patient reported that her speech did not sound normal but no obvious slurring was detected on  exam.She stated that she was on HTN meds before but currently not on it. She denies smoking. She was discharged with physical therapy She returns today for follow-up. Her therapies concluded last week and she is doing her home exercise program , she claims she is compliant with her medications she returns for reevaluation . She has not had further stroke or TIA symptoms   REVIEW OF SYSTEMS: Full 14 system review of systems performed and notable only for those listed, all others are neg:  Constitutional: neg  Cardiovascular: neg Ear/Nose/Throat: neg  Skin: neg Eyblurred visionRespiratory: neg Gastroitestinal: neg  Hematology/Lymphatic: neg  Endocrine: neg Musculoskeletal:Joint pain neck stiffness Allergy/Immunology: neg Neurological: neg Psychiatric: neg Sleep :daytime sleepinessALLERGIES: Allergies  Allergen Reactions  . Shrimp [Shellfish Allergy] Swelling    Of lips    HOME MEDICATIONS: Outpatient Medications Prior to Visit  Medication Sig Dispense Refill  . amLODipine (NORVASC) 10 MG tablet Take 1 tablet (10 mg total) by mouth daily. 90 tablet 3  . aspirin EC 325 MG EC tablet Take 1 tablet (325 mg total) by mouth daily. 30 tablet 0  . atorvastatin (LIPITOR) 80 MG tablet Take 1 tablet (80 mg total) by mouth daily at 6 PM. 30 tablet 0  . lisinopril-hydrochlorothiazide (ZESTORETIC) 20-12.5 MG tablet Take 2 tablets by mouth daily. 60 tablet 1   No facility-administered medications prior to visit.     PAST MEDICAL HISTORY: Past Medical History:  Diagnosis Date  . Allergy   . GERD (gastroesophageal reflux disease)   . Hypertension     PAST SURGICAL HISTORY: Past Surgical History:  Procedure Laterality Date  . TUBAL LIGATION  FAMILY HISTORY: Family History  Problem Relation Age of Onset  . Stroke Mother     52 yo  . Asthma Daughter   . Cancer Maternal Grandmother     SOCIAL HISTORY: Social History   Social History  . Marital status: Single    Spouse  name: N/A  . Number of children: N/A  . Years of education: N/A   Occupational History  . [employer]    Social History Main Topics  . Smoking status: Never Smoker  . Smokeless tobacco: Never Used  . Alcohol use Yes  . Drug use: No  . Sexual activity: Not on file   Other Topics Concern  . Not on file   Social History Narrative  . No narrative on file     PHYSICAL EXAM  Vitals:   06/30/16 1501  BP: (!) 142/98  Pulse: 72  Weight: 218 lb 12.8 oz (99.2 kg)  Height: 5\' 4"  (1.626 m)   Body mass index is 37.56 kg/m.  Generalized: Well developed, in no acute distress  Head: normocephalic and atraumatic,. Oropharynx benign  Neck: Supple, no carotid bruits  Cardiac: Regular rate rhythm, no murmur  Musculoskeletal: No deformity   Neurological examination   Mentation: Alert oriented to time, place, history taking. Attention span and concentration appropriate. Recent and remote memory intact.  Follows all commands speech and language fluent.   Cranial nerve II-XII: Fundoscopic exam reveals sharp disc margins.Pupils were equal round reactive to light extraocular movements were full, visual field were full on confrontational test. Mild right nasolabial fold flattening.Hearing was intact to finger rubbing bilaterally. Uvula tongue midline. head turning and shoulder shrug were normal and symmetric.Tongue protrusion into cheek strength was normal. Motor: normal bulk and tone, full strength in the BUE, BLE,  except right upper extremity 4 out of 5 and pronator drift present on the right  Sensory: normal and symmetric to light touch, pinprick, and  Vibration,  mildly decreased on the right  Coordination: finger-nose-ataxia with the right hand Reflexes: 1+ upper lower and symmetric plantar responses were flexor bilaterally. Gait and Station: Rising up from seated position without assistance,  wide based stance,  moderate stride,  smooth turning, able to perform tiptoe, and heel  walking without difficulty. Tandem gait mildly unsteady  DIAGNOSTIC DATA (LABS, IMAGING, TESTING) - I reviewed patient records, labs, notes, testing and imaging myself where available.  Lab Results  Component Value Date   WBC 5.9 05/21/2016   HGB 15.0 05/21/2016   HCT 46.9 (H) 05/21/2016   MCV 81.6 05/21/2016   PLT 257 05/21/2016      Component Value Date/Time   NA 141 06/11/2016 1620   K 3.8 06/11/2016 1620   CL 100 06/11/2016 1620   CO2 27 06/11/2016 1620   GLUCOSE 117 (H) 06/11/2016 1620   BUN 16 06/11/2016 1620   CREATININE 1.01 06/11/2016 1620   CALCIUM 9.6 06/11/2016 1620   PROT 7.4 05/21/2016 1349   ALBUMIN 3.7 05/21/2016 1349   AST 24 05/21/2016 1349   ALT 21 05/21/2016 1349   ALKPHOS 93 05/21/2016 1349   BILITOT 1.3 (H) 05/21/2016 1349   GFRNONAA 64 06/11/2016 1620   GFRAA 74 06/11/2016 1620   Lab Results  Component Value Date   CHOL 277 (H) 05/16/2016   HDL 59 05/16/2016   LDLCALC 195 (H) 05/16/2016   TRIG 115 05/16/2016   CHOLHDL 4.7 05/16/2016   Lab Results  Component Value Date   HGBA1C 5.9 (H) 05/16/2016   No  results found for: VITAMINB12 Lab Results  Component Value Date   TSH 1.381 05/16/2016      ASSESSMENT AND PLAN  50 y.o. 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   PLAN: Stressed the importance of management of risk factors to prevent further stroke Continue aspirin for secondary stroke prevention Maintain strict control of hypertension with blood pressure goal below 130/90, today's reading 142/98 continue antihypertensive medications Cholesterol with LDL cholesterol less than 70, followed by primary care,  Lipitor HEP to gradually improve strength, eat healthy diet with whole grains,  fresh fruits and vegetables Discussed risk for recurrent stroke/ TIA and  answered additional questions Follow-up in 3 monthsVst time 30 min Nilda Riggs, [facility], [facility], APRN  [facility] [address] [place], [place] [address] [phone]

## 2016-07-02 ENCOUNTER — Ambulatory Visit: Payer: Self-pay | Attending: Internal Medicine

## 2016-07-03 NOTE — Progress Notes (Signed)
I reviewed above note and agree with the assessment and plan.  Hi, Kathleen JonesCarolyn, do you know why she stopped plavix? Original plan was for ASA and plavix x 3 months. Thanks.  Marvel PlanJindong Domanic Matusek, MD PhD Stroke Neurology 07/03/2016 6:32 AM

## 2016-07-10 ENCOUNTER — Ambulatory Visit (INDEPENDENT_AMBULATORY_CARE_PROVIDER_SITE_OTHER): Payer: Self-pay | Admitting: *Deleted

## 2016-07-10 ENCOUNTER — Telehealth: Payer: Self-pay | Admitting: *Deleted

## 2016-07-10 VITALS — BP 144/90 | HR 80

## 2016-07-10 DIAGNOSIS — Z136 Encounter for screening for cardiovascular disorders: Secondary | ICD-10-CM

## 2016-07-10 DIAGNOSIS — I10 Essential (primary) hypertension: Secondary | ICD-10-CM

## 2016-07-10 DIAGNOSIS — Z0130 Encounter for examination of blood pressure without abnormal findings: Secondary | ICD-10-CM

## 2016-07-10 MED ORDER — ATORVASTATIN CALCIUM 80 MG PO TABS: 80.0000 mg | ORAL_TABLET | Freq: Every day | ORAL | 2 refills | Status: DC

## 2016-07-10 NOTE — Progress Notes (Signed)
   Patient in nurse clinic for blood pressure check.  Pt denies chest pain, shortness of breath, headache, dizziness or visual changes.  Advised patient to follow up with PCP within the next 1-3 weeks.  Lipitor refilled sent to patient's pharmacy.  Clovis PuMartin, Amya Hlad L, RN   Today's Vitals   07/10/16 0918 07/10/16 0924  BP: (!) 150/110 (!) 144/90  Pulse: 86 80  SpO2: 99% 97%  PainSc: 0-No pain

## 2016-07-10 NOTE — Telephone Encounter (Signed)
Patient requesting refill on Lipitor.  Verbal order given by Dr. [name] to fill medication.  Clovis PuMartin, Lisia Westbay L, RN

## 2016-07-16 ENCOUNTER — Other Ambulatory Visit: Payer: Self-pay | Admitting: Family Medicine

## 2016-07-16 ENCOUNTER — Telehealth: Payer: Self-pay | Admitting: Family Medicine

## 2016-07-16 NOTE — Telephone Encounter (Signed)
Pharmacy changed in Epic, will forward to PCP. 

## 2016-07-16 NOTE — Telephone Encounter (Signed)
Pt stated her medications were sent to the wrong pharmacy, pt would [facility]. Pt also needs refills on BP medication the 20mg  one and cholesterol medication. Please advise. Thanks! ep

## 2016-07-20 ENCOUNTER — Ambulatory Visit: Payer: Self-pay

## 2016-07-22 DIAGNOSIS — Z0271 Encounter for disability determination: Secondary | ICD-10-CM

## 2016-08-03 ENCOUNTER — Ambulatory Visit (INDEPENDENT_AMBULATORY_CARE_PROVIDER_SITE_OTHER): Payer: Medicaid Other | Admitting: Family Medicine

## 2016-08-03 ENCOUNTER — Encounter: Payer: Self-pay | Admitting: Family Medicine

## 2016-08-03 VITALS — BP 162/103 | HR 118 | Temp 97.8°F | Wt 223.0 lb

## 2016-08-03 DIAGNOSIS — I10 Essential (primary) hypertension: Secondary | ICD-10-CM

## 2016-08-03 DIAGNOSIS — I639 Cerebral infarction, unspecified: Secondary | ICD-10-CM | POA: Diagnosis not present

## 2016-08-03 DIAGNOSIS — I63519 Cerebral infarction due to unspecified occlusion or stenosis of unspecified middle cerebral artery: Secondary | ICD-10-CM

## 2016-08-03 MED ORDER — AMLODIPINE BESYLATE 10 MG PO TABS: 10.0000 mg | ORAL_TABLET | Freq: Every day | ORAL | 3 refills | Status: DC

## 2016-08-03 MED ORDER — ATORVASTATIN CALCIUM 80 MG PO TABS: 80.0000 mg | ORAL_TABLET | Freq: Every day | ORAL | 2 refills | Status: DC

## 2016-08-03 MED ORDER — LISINOPRIL-HYDROCHLOROTHIAZIDE 20-12.5 MG PO TABS: 2.0000 | ORAL_TABLET | Freq: Every day | ORAL | 1 refills | Status: DC

## 2016-08-03 MED ORDER — CLOPIDOGREL BISULFATE 75 MG PO TABS: 75.0000 mg | ORAL_TABLET | Freq: Every day | ORAL | 3 refills | Status: DC

## 2016-08-03 MED ORDER — ATORVASTATIN CALCIUM 80 MG PO TABS: ORAL_TABLET | ORAL | 0 refills | Status: DC

## 2016-08-03 NOTE — Assessment & Plan Note (Signed)
Patient had stroke in July that left her with right sided deficits in MCA distribution.  Has completed rehab and strength is improving.  Supposed to be on plavix 75mg  daily and ASA 325 daily for three months and not currently on plavix.   - Started on plavix 75mg  daily - Continue ASA 325mg  - Follow up in one month - Continue lipitor 80mg 

## 2016-08-03 NOTE — Progress Notes (Signed)
    Subjective:  Kathleen Larson is a 50 y.o. female who [facility] for blood pressure follow up.  HPI:  Hypertension: Patient has hypertension with history of R MCA stroke in July who presents for a follow up.  She is on lisinopril/HCTZ and amlodipine.  Denies any chest pain, SOB, headache or changes in vision.  Reports that blood pressures at home are in the mid 130s-120s over 70s-80s.  Takes blood pressure daily at home, but forgot her recordings.    CVA: History of CVA in July and has had follow up with stroke clinic since.  Her right sided deficits are improving, but she is still weak throughout and has a slight slur in her speech.  Was advised to be on 325 ASA and plavix 75mg  for 3mo and then to stop the ASA.  Patient has not been on Plavix as planned.  She has been taking 80mg  Lipitor, however. Denies any numbness, weakness, changes in speech or strength.   PMH: L-MCA stroke, HTN ROS: see HPI  Objective:  Physical Exam: BP (!) 162/103 (BP Location: Left Arm, Patient Position: Sitting, Cuff Size: Large)   Pulse (!) 118   Temp 97.8 F (36.6 C) (Oral)   Wt 223 lb (101.2 kg)   LMP 10/14/2012   SpO2 100%   BMI 38.28 kg/m   Gen: 50yo F in NAD, resting comfortably CV: RRR with no murmurs appreciated Pulm: NWOB, CTAB with no crackles, wheezes, or rhonchi GI: Normal bowel sounds present. Soft, Nontender, Nondistended. MSK: no edema, cyanosis, or clubbing noted, strength 4/5 on right side upper and lower extremities Skin: warm, dry Neuro: grossly normal, moves all extremities, sensation intact Psych: Normal affect and thought content  No results found for this or any previous visit (from the past 72 hour(s)).   Assessment/Plan:  Hypertension Blood pressure today was 162/103 and patient reports that blood pressure is usually much lower with systolic closer to 130s and diastolic in 80s.  Denies any hypertensive symptoms. - Asked patient to follow up with Dr.  Raymondo BandKoval for 24hr blood pressure monitoring - Continue Lisinopril/HCTZ 40mg /25 mg QD and Amlodipine - Continue with daily blood pressures and follow up in one month  Cerebrovascular accident (CVA) [facility](HCC) Patient had stroke in July that left her with right sided deficits in MCA distribution.  Has completed rehab and strength is improving.  Supposed to be on plavix 75mg  daily and ASA 325 daily for three months and not currently on plavix.   - Started on plavix 75mg  daily - Continue ASA 325mg  - Follow up in one month - Continue lipitor 80mg

## 2016-08-03 NOTE — Assessment & Plan Note (Signed)
Blood pressure today was 162/103 and patient reports that blood pressure is usually much lower with systolic closer to 130s and diastolic in 80s.  Denies any hypertensive symptoms. - Asked patient to follow up with Dr. Raymondo BandKoval for 24hr blood pressure monitoring - Continue Lisinopril/HCTZ 40mg /25 mg QD and Amlodipine - Continue with daily blood pressures and follow up in one month

## 2016-08-03 NOTE — Patient Instructions (Signed)
Kathleen Larson you were seen today for a blood pressure follow up.  Today your pressure was 162/103.  You stated that at home your pressures were in the 120s-130s over 70s-80s.  You denied any chest pain, shortness of breath or changes in vision.  Before I start you on a new medication I would like for you to come in to see Dr. Raymondo BandKoval to have a 24 hour blood pressure check.  Please make an appointment on your way out.    Please pick a new prescription for Plavix and take this daily.  Continue taking your aspirin daily.  I have refilled your blood pressure prescriptions and your lipitor.   Please follow up with me in one month.  Take care, Olander Friedl L. [name], MD [facility] Resident PGY-1 08/03/2016 12:16 PM

## 2016-08-04 ENCOUNTER — Ambulatory Visit (INDEPENDENT_AMBULATORY_CARE_PROVIDER_SITE_OTHER): Payer: Medicaid Other | Admitting: Pharmacist

## 2016-08-04 ENCOUNTER — Encounter: Payer: Self-pay | Admitting: Pharmacist

## 2016-08-04 DIAGNOSIS — I63519 Cerebral infarction due to unspecified occlusion or stenosis of unspecified middle cerebral artery: Secondary | ICD-10-CM

## 2016-08-04 DIAGNOSIS — I10 Essential (primary) hypertension: Secondary | ICD-10-CM | POA: Diagnosis not present

## 2016-08-04 NOTE — Progress Notes (Signed)
   S:    Patient arrives in fair spirits, ambulating without assistance.    Presents to the clinic for ambulatory blood pressure evaluation.  Patient was referred on 08/03/16 by Dr. [name].  Patient was last seen by Primary Care Provider on 08/03/16.    Discussed procedure for wearing the monitor and gave patient written instructions. Monitor was placed on non-dominant arm with instructions to return in the morning.    Current BP Medications include:  Amlodipine 10mg  daily, Lisinopril 20/12.5mg  tablets 2 per day.   Patient reports good medication compliance.  However, when she returned to the office with the device 9/27 she reported that the meter functioned well, was bothersome but she did complete the entire 24 hour evaluation.  Her sister [name] came with her on 9/27 and reported that her medications were more expensive than they had been in the past.   She wanted to know of less expensive options.    O:   Last 3 Office BP readings: BP Readings from Last 3 Encounters:  08/04/16 (!) 142/91  08/03/16 (!) 162/103  07/10/16 (!) 144/90  Readings in office over the last 2-3 months appear to be ~150/100 or possibly slightly higher.   ABPM Study Data: Arm Placement left arm   Overall Mean 24hr BP:   136/86 mmHg  HR: 76   Daytime Mean BP:  144/94  mmHg HR: 76   Nighttime Mean BP:  124/75 mmHg  HR: 75   Dipping Pattern: Yes.    Sys:   14.4%    Dia: 20.5%   [normal dipping ~10-20%]   Non-hypertensive ABPM thresholds: daytime BP <135/85 mmHg, sleeptime BP <120/70 mmHg NICE Hypertension Guidelines ([place]) using ABPM: Stage I: >135/85 mmHg, Stage 2: >150/95 mmHg)   BMET    Component Value Date/Time   NA 141 06/11/2016 1620   K 3.8 06/11/2016 1620   CL 100 06/11/2016 1620   CO2 27 06/11/2016 1620   GLUCOSE 117 (H) 06/11/2016 1620   BUN 16 06/11/2016 1620   CREATININE 1.01 06/11/2016 1620   CALCIUM 9.6 06/11/2016 1620   GFRNONAA 64 06/11/2016 1620   GFRAA 74 06/11/2016 1620     A/P: History of hypertension since "her 30s" ~ 20 years found to have white coat hypertension (BP at goal)  Given 24-hour ambulatory blood pressure demonstrates a few excursion in the early AM when the patient reports "her feet feel like bricks". Her average blood pressure was near goal for Amb BP monitoring at 136/86 mmHg, and a nocturnal dipping pattern that is normal.  We made no changes to medications however we did prescribe all of her [facility] to hopefully provide better pricing for all of her medications.  She verbalized understanding the need to take all her prescribed medications including atorvastatin, aspirin and clopidogrel to help her avoid another stroke.   She was asked to contact us if the cost of her medications was still too expensive.    Results reviewed and written information provided.  Total time in face-to-face counseling 30 minutes.   F/U Clinic Visit with Dr. [name].  Patient seen with Carole Binninghristine Chong, PharmD Candidate and Ginger CarneJennifer Otter, PharmD Candidate.

## 2016-08-04 NOTE — Patient Instructions (Signed)
Wearing the Blood Pressure Monitor  The cuff will inflate every 20 minutes during the day and every 30 minutes while you sleep.  Your blood pressure readings will NOT display after cuff inflation  Fill out the blood pressure-activity diary during the day, especially during activities that may affect your reading -- such as exercise, stress, walking, taking your blood pressure medications  Important things to know:  Avoid taking the monitor off for the next 24 hours, unless it causes you discomfort or pain.  Do NOT get the monitor wet and do NOT dry to clean the monitor with any cleaning products.  Do NOT drop the monitor.  Do NOT put the monitor on anyone else's arm.  When the cuff inflates, avoid excess movement. Let the cuffed arm hang loosely, slightly away from the body. Avoid flexing the muscles or moving the hand/fingers.  When you go to sleep, make sure that the hose is not kinked.  Remember to fill out the blood pressure activity diary.  If you experience severe pain or unusual pain (not associated with getting your blood pressure checked), remove the monitor.  Troubleshooting:  Code  Troubleshooting   1  Check cuff position, tighten cuff   2, 3  Remain still during reading   4, 87  Check air hose connections and make sure cuff is tight   85, 89  Check hose connections and make tubing is not crimped   86  Push START/STOP to restart reading   88, 91  Retry by pushing START/STOP   90  Replace batteries. If problem persists, remove monitor and bring back to   clinic at follow up   97, 98, 99  Service required - Remove monitor and bring back to clinic at follow up    Blood Pressure Activity Diary  Time Lying down/ Sleeping Walking/ Exercise Stressed/ Angry Headache/ Pain Dizzy  9 AM       10 AM       11 AM       12 PM       1 PM       2 PM       Time Lying down/ Sleeping Walking/ Exercise Stressed/ Angry Headache/ Pain Dizzy  3 PM       4 PM        5 PM       6  PM       7 PM       8 PM       Time Lying down/ Sleeping Walking/ Exercise Stressed/ Angry Headache/ Pain Dizzy  9 PM       10 PM       11 PM       12 AM       1 AM       2 AM       3 AM       Time Lying down/ Sleeping Walking/ Exercise Stressed/ Angry Headache/ Pain Dizzy  4 AM       5 AM       6 AM       7 AM       8 AM       9 AM       10 AM        Time you woke up: _________                  Time you went to sleep:__________      Come back tomorrow at ___________ to have the monitor removed  Call the Family Medicine Clinic if you have any questions before then ([phone]) 

## 2016-08-05 MED ORDER — ATORVASTATIN CALCIUM 80 MG PO TABS: ORAL_TABLET | ORAL | 2 refills | Status: DC

## 2016-08-05 MED ORDER — LISINOPRIL-HYDROCHLOROTHIAZIDE 20-12.5 MG PO TABS: 2.0000 | ORAL_TABLET | Freq: Every day | ORAL | 2 refills | Status: DC

## 2016-08-05 MED ORDER — AMLODIPINE BESYLATE 10 MG PO TABS: 10.0000 mg | ORAL_TABLET | Freq: Every day | ORAL | 1 refills | Status: DC

## 2016-08-05 MED ORDER — CLOPIDOGREL BISULFATE 75 MG PO TABS: 75.0000 mg | ORAL_TABLET | Freq: Every day | ORAL | 1 refills | Status: DC

## 2016-08-05 NOTE — Assessment & Plan Note (Signed)
History of hypertension since "her 30s" ~ 20 years found to have white coat hypertension (BP at goal)  Given 24-hour ambulatory blood pressure demonstrates a few excursion in the early AM when the patient reports "her feet feel like bricks". Her average blood pressure was near goal for Amb BP monitoring at 136/86 mmHg, and a nocturnal dipping pattern that is normal.  We made no changes to medications however we did prescribe all of her medications to [facility] to hopefully provide better pricing for all of her medications.  She verbalized understanding the need to take all her prescribed medications including atorvastatin, aspirin and clopidogrel to help her avoid another stroke.   She was asked to contact us if the cost of her medications was still too expensive.

## 2016-08-06 ENCOUNTER — Ambulatory Visit: Payer: Medicaid Other | Attending: Family Medicine | Admitting: Speech Pathology

## 2016-08-06 DIAGNOSIS — I69890 Apraxia following other cerebrovascular disease: Secondary | ICD-10-CM | POA: Diagnosis present

## 2016-08-06 DIAGNOSIS — I69920 Aphasia following unspecified cerebrovascular disease: Secondary | ICD-10-CM

## 2016-08-06 NOTE — Progress Notes (Signed)
Patient ID: Kathleen Larson, female   DOB: Jul 28, 1966, 50 y.o.   MRN: [id number] Reviewed: Agree with Dr. Macky LowerKoval's documentation and management.

## 2016-08-06 NOTE — Patient Instructions (Signed)
Tips for Talking with People who have Aphasia  . Say one thing at a time . Don't  rush - slow down, be patient . Reduce background noise . Relax - be natural . Use pen and paper . Write down key words . Draw diagrams or pictures . Don't pretend you understand . Ask what helps . Recap - check you both understand   Describing words  What group does it belong to?  What do I use it for?  Where can I find it?  What does it LOOK like?  What other words go with it?  What is the 1st sound of the word?  Many Ways to Communicate  Describe it Write it Draw it Gesture it Use related words  There's an App for that: Family Feud, Heads up, Stop-fun categories, What if, Conversation TherAPPy  Provided by: Rolin BarryLaura L. ST, [phone]    Speech exercises - do 5x each, x2-3/day SLOW BIG  SAY THE FOLLOWING- make every sound! Red leather, yellow leather  Big grocery buggy    Purple baby carriage    [facility] Proper copper coffee pot Ripe purple cabbage Three free throws [employer] Terrap[employer] Red Bulb, Blue Bulb Flash Message Dave dipped the dessert  [employer] Devils An Art gallery managerngineer for [employer] Five valve levers Six Thick Thistles Stick Double Bubble Gum Fat cows give milk Automatic DataMinnesota Golden Gophers Fat frogs flip freely TXU Corpuck Tommy into bed Get that game to The [place] Travelersreg Freshly Fried Fat Fish Cinnamon aluminum linoleum Black bugs blood Lovely lemon linament Buckle that Air traffic controllerbracket  Tying Tape Takes Time A Shifty Salt Shaker   The gospel of Mark Shirts shrink, shells shouldn't [place] 49ers Take the tackle box Give me five flapjacks Fundamental relatives Call the cat "Buttercup" A calendar of [place] Four floors to cover Yellow oil ointment Fellow lovers of felines Catastrophe in [place] Plump plumbers' plums The church's chimes chimed Telling time until eleven Unique [place] A Three Toed Tree Toad Knapsack Strap Snap Rubber [address] StBaby Buggy Bumpers

## 2016-08-06 NOTE — Therapy (Signed)
[facility] [address] [place], [place], [address] Phone: [phone]   Fax:  [phone]  Speech Language Pathology Evaluation  Patient Details  Name: Kathleen Larson MRN: [id number] Date of Birth: 12/17/1965 Referring Provider: Doralee Albino, MD  Encounter Date: 08/06/2016      End of Session - 08/06/16 1213    Visit Number 1   Number of Visits 9   Date for SLP Re-Evaluation 10/22/16   Authorization Type Medicaid pending - I had pt schedule out 3 weeks after eval - requesting 1x a week for 8 weeks,    SLP Start Time 1102   SLP Stop Time  1151   SLP Time Calculation (min) 49 min      Past Medical History:  Diagnosis Date  . Allergy   . GERD (gastroesophageal reflux disease)   . Hypertension     Past Surgical History:  Procedure Laterality Date  . TUBAL LIGATION      There were no vitals filed for this visit.      Subjective Assessment - 08/06/16 1113    Subjective "I have trouble getting my words out"   Patient is accompained by: Family member  [name]   Currently in Pain? Yes   Pain Score 7    Pain Location Leg   Pain Orientation Right   Pain Descriptors / Indicators Aching   Pain Onset 1 to 4 weeks ago   Pain Frequency Constant   Aggravating Factors  worse at night   Multiple Pain Sites No            SLP Evaluation [facility] - 08/06/16 1113      SLP Visit Information   SLP Received On 08/06/16   Referring Provider Doralee Albino, MD   Onset Date 05/16/2016   Medical Diagnosis CVA     General Information   Mobility Status walks independently - reports falls at home rec PT eval     Prior Functional Status   Cognitive/Linguistic Baseline Information not available   Type of Home Apartment    Lives With Alone   Available Support Family   Vocation On disability     Cognition   Overall Cognitive Status Within Functional Limits for tasks assessed   Memory Impaired   Memory  Impairment Decreased recall of new information     Auditory Comprehension   Overall Auditory Comprehension Impaired   Yes/No Questions Within Functional Limits   Commands Impaired   Multistep Basic Commands 75-100% accurate   Complex Commands 50-74% accurate   Conversation Simple   Interfering Components Processing speed;Motor planning   EffectiveTechniques Slowed speech;Visual/Gestural cues;Pausing;Extra processing time     Reading Comprehension   Reading Status --  intact for simple phrases     Expression   Primary Mode of Expression Verbal     Verbal Expression   Overall Verbal Expression Impaired   Initiation Impaired   Automatic Speech Name;Social Response;Counting;Day of week;Month of year  halting, groping, inconsistent errors   Level of Generative/Spontaneous Verbalization Phrase   Naming Impairment   Responsive 76-100% accurate   Confrontation 75-100% accurate   Convergent 50-74% accurate   Divergent 50-74% accurate   Verbal Errors Phonemic paraphasias;Aware of errors   Pragmatics No impairment   Interfering Components Speech intelligibility   Effective Techniques Semantic cues;Phonemic cues     Written Expression   Dominant Hand Right   Written Expression Exceptions to [facility]   Self Formulation Ability Phrase     Oral  Motor/Sensory Function   Overall Oral Motor/Sensory Function Appears within functional limits for tasks assessed     Motor Speech   Overall Motor Speech Impaired   Respiration Impaired   Level of Impairment Phrase   Phonation Hoarse   Resonance Within functional limits   Intelligibility Intelligibility reduced   Word 75-100% accurate   Phrase 75-100% accurate   Sentence 50-74% accurate   Motor Planning Impaired   Level of Impairment Phrase   Motor Speech Errors Groping for words;Inconsistent;Aware   Effective Techniques Slow rate;Pause     Standardized Assessments   Standardized Assessments  [place] Naming Test-2nd edition                          SLP Education - 08/06/16 1213    Education provided Yes   Education Details goals, areas of impairment, homework packet   Person(s) Educated Patient;Other (comment)  sister, [name]   Methods Explanation;Demonstration;Handout;Verbal cues   Comprehension Verbalized understanding;Need further instruction            SLP Long Term Goals - 08/06/16 1226      SLP LONG TERM GOAL #1   Title Pt will complete mildly complex naming tasks with 80% accuracy and rare min A   Baseline 50% accuracy   Time 8   Period Weeks   Status New     SLP LONG TERM GOAL #2   Title Pt will utilize compensations for aphasia over 10 minute simple conversation with less than 2 requests for clarificiatoin   Baseline Pt not using compensations for aphasia   Time 8   Period Weeks   Status New     SLP LONG TERM GOAL #3   Title Pt will utilize compensations for verbal apraxia over 10 minute conversation to be 90% intelligible with rare min A   Baseline Pt 75% intelligible at sentence level   Time 8   Period Weeks   Status New          Plan - 08/06/16 1556    Clinical Impression Statement [name], a 50 y.o. 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 She was hospitalized fron 05/16/16 to 05/20/16. She received ST in the hospital for cognition, aphasia and verbal apraxia. 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 Auditory processing is slow and intact to 3 step simple commands, however is imaired for complex conversation. Automatic speech  and rapid alternating speech is impaired  with halting, groping and inconsistent errors. Pt is aware of errors, but not able to self correct successfully. 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 The [employer] revealed a score of 30/60 with 55/60 being [facility]. Mrs.  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 She is currently not using strategies to compensate for her speech and language impairments. I recommend skilled ST to maximize verbal expression and intellgibility for improved independence and possible return to work in some capacity.   Per pt and her sister, medicaid is pending, They wish to initiate ST after medicaid has been approved due to financial concerns.       Patient will benefit from skilled therapeutic intervention in order to improve the following deficits and impairments:   Aphasia following unspecified cerebrovascular disease  Apraxia following other cerebrovascular disease    Problem List Patient Active Problem List   Diagnosis Date  Noted  . Neck pain on left side 06/19/2016  . Essential hypertension, benign 05/27/2016  . Medical non-compliance   . Prediabetes   . RUE weakness   . Intracranial vascular stenosis   . Stroke (HCC) 05/16/2016  . Cerebrovascular accident (CVA) (HCC)   . Essential hypertension   . Obesity   . Abnormal EKG   . Hypertension 09/17/2012  . FATIGUE 01/06/2010  . CONSTIPATION 03/22/2009  . HELICOBACTER PYLORI INFECTION, HX OF 01/28/2009  . ALLERGIC RHINITIS 03/02/2008  . ABDOMINAL PAIN 03/02/2008  . Hyperlipemia 07/18/2007    Marcayla Budge, Radene JourneyLaura Ann 08/06/2016, 3:59 PM  [facility] [facility] [address] [place], [place], [address] Phone: [phone]   Fax:  [phone]  Name: Treasa SchoolConstance D Califf MRN: [id number] Date of Birth: 11/22/1965

## 2016-09-02 DIAGNOSIS — Z0279 Encounter for issue of other medical certificate: Secondary | ICD-10-CM

## 2016-09-04 ENCOUNTER — Ambulatory Visit: Payer: Medicaid Other | Attending: Family Medicine | Admitting: Speech Pathology

## 2016-09-04 DIAGNOSIS — I69890 Apraxia following other cerebrovascular disease: Secondary | ICD-10-CM | POA: Insufficient documentation

## 2016-09-04 DIAGNOSIS — I69920 Aphasia following unspecified cerebrovascular disease: Secondary | ICD-10-CM

## 2016-09-04 NOTE — Therapy (Signed)
[facility] [address] [place], [place], [address] Phone: [phone]   Fax:  [phone]  Speech Language Pathology Treatment  Patient Details  Name: Kathleen Larson MRN: [id number] Date of Birth: Mar 21, 1966 Referring Provider: Doralee Albino, MD  Encounter Date: 09/04/2016      End of Session - 09/04/16 1251    Visit Number 2   Number of Visits 9   Date for SLP Re-Evaluation 10/22/16   Authorization Type Medicaid pending - I had pt schedule out 3 weeks after eval - requesting 1x a week for 8 weeks,    SLP Start Time 1147   SLP Stop Time  1238   SLP Time Calculation (min) 51 min      Past Medical History:  Diagnosis Date  . Allergy   . GERD (gastroesophageal reflux disease)   . Hypertension     Past Surgical History:  Procedure Laterality Date  . TUBAL LIGATION      There were no vitals filed for this visit.      Subjective Assessment - 09/04/16 1157    Subjective "It's been a little better" re: speech               ADULT SLP TREATMENT - 09/04/16 1159      General Information   Behavior/Cognition Alert;Cooperative;Pleasant mood     Treatment Provided   Treatment provided Cognitive-Linquistic     Pain Assessment   Pain Assessment 0-10   Pain Score 7    Pain Location right leg and foot   Pain Descriptors / Indicators Aching;Constant;Discomfort   Pain Intervention(s) Monitored during session     Cognitive-Linquistic Treatment   Treatment focused on Aphasia   Skilled Treatment Facilitated naming 8 items in simple category with extended time and usual min to mod semantic  cues and written cues  to generate more than 6.  Trained pt in compensations for aphasia generating 3 descriptions of  simple animals/food - with occasional min questioning cues. Sentence generation with 2 given words with mod A     Assessment / Recommendations / Plan   Plan Continue with current plan of care      Progression Toward Goals   Progression toward goals Progressing toward goals          SLP Education - 09/04/16 1246    Education provided Yes   Education Details compensations for aphasia and apraxia, HEP for verbal apraxia do be done 2x a day,   Person(s) Educated Patient   Methods Explanation;Demonstration;Verbal cues;Handout   Comprehension Verbalized understanding;Returned demonstration;Verbal cues required            SLP Long Term Goals - 09/04/16 1251      SLP LONG TERM GOAL #1   Title Pt will complete mildly complex naming tasks with 80% accuracy and rare min A   Baseline 50% accuracy   Time 7   Period Weeks   Status New     SLP LONG TERM GOAL #2   Title Pt will utilize compensations for aphasia over 10 minute simple conversation with less than 2 requests for clarificiatoin   Baseline Pt not using compensations for aphasia   Time 7   Period Weeks   Status New     SLP LONG TERM GOAL #3   Title Pt will utilize compensations for verbal apraxia over 10 minute conversation to be 90% intelligible with rare min A   Baseline Pt 75% intelligible at sentence level   Time  7   Period Weeks   Status New          Plan - 09/04/16 1247    Clinical Impression Statement Pt continue to require extended time, semantic, written, and phonemic cues for simple naming. Conversation remains halting with distortions, and pt requires ongoing cues to increase utterance length. Continue skilled ST to maximize verbal expression and intellgibilty for improved independence and possible return to work.    Speech Therapy Frequency 1x /week   Duration --  8 weeks   Treatment/Interventions Compensatory strategies;Functional tasks;Patient/family education;Multimodal communcation approach;SLP instruction and feedback;Language facilitation;Compensatory techniques   Potential to Achieve Goals Good   Potential Considerations Financial resources   Consulted and Agree with Plan of Care  Patient;Family member/caregiver      Patient will benefit from skilled therapeutic intervention in order to improve the following deficits and impairments:   Aphasia following unspecified cerebrovascular disease  Apraxia following other cerebrovascular disease    Problem List Patient Active Problem List   Diagnosis Date Noted  . Neck pain on left side 06/19/2016  . Essential hypertension, benign 05/27/2016  . Medical non-compliance   . Prediabetes   . RUE weakness   . Intracranial vascular stenosis   . Stroke (HCC) 05/16/2016  . Cerebrovascular accident (CVA) (HCC)   . Essential hypertension   . Obesity   . Abnormal EKG   . Hypertension 09/17/2012  . FATIGUE 01/06/2010  . CONSTIPATION 03/22/2009  . HELICOBACTER PYLORI INFECTION, HX OF 01/28/2009  . ALLERGIC RHINITIS 03/02/2008  . ABDOMINAL PAIN 03/02/2008  . Hyperlipemia 07/18/2007    Kimbly Eanes, Radene JourneyLaura Ann MS, CCC-SLP 09/04/2016, 12:53 PM  [place] [facility] [address] [place], [place], [address] Phone: [phone]   Fax:  [phone]   Name: Treasa SchoolConstance D Mahler MRN: [id number] Date of Birth: 1966/08/12

## 2016-09-09 ENCOUNTER — Ambulatory Visit: Payer: Medicaid Other | Admitting: Family Medicine

## 2016-09-10 ENCOUNTER — Ambulatory Visit (INDEPENDENT_AMBULATORY_CARE_PROVIDER_SITE_OTHER): Payer: Medicaid Other | Admitting: Family Medicine

## 2016-09-10 ENCOUNTER — Encounter: Payer: Self-pay | Admitting: Family Medicine

## 2016-09-10 VITALS — BP 137/87 | HR 72 | Temp 97.6°F | Wt 224.8 lb

## 2016-09-10 DIAGNOSIS — Z1159 Encounter for screening for other viral diseases: Secondary | ICD-10-CM | POA: Diagnosis not present

## 2016-09-10 DIAGNOSIS — I10 Essential (primary) hypertension: Secondary | ICD-10-CM | POA: Diagnosis not present

## 2016-09-10 DIAGNOSIS — S161XXA Strain of muscle, fascia and tendon at neck level, initial encounter: Secondary | ICD-10-CM

## 2016-09-10 DIAGNOSIS — Z1211 Encounter for screening for malignant neoplasm of colon: Secondary | ICD-10-CM

## 2016-09-10 DIAGNOSIS — Z23 Encounter for immunization: Secondary | ICD-10-CM

## 2016-09-10 DIAGNOSIS — Z1231 Encounter for screening mammogram for malignant neoplasm of breast: Secondary | ICD-10-CM

## 2016-09-10 DIAGNOSIS — M79651 Pain in right thigh: Secondary | ICD-10-CM | POA: Insufficient documentation

## 2016-09-10 DIAGNOSIS — Z0000 Encounter for general adult medical examination without abnormal findings: Secondary | ICD-10-CM | POA: Insufficient documentation

## 2016-09-10 DIAGNOSIS — Z7902 Long term (current) use of antithrombotics/antiplatelets: Secondary | ICD-10-CM | POA: Insufficient documentation

## 2016-09-10 DIAGNOSIS — M79604 Pain in right leg: Secondary | ICD-10-CM | POA: Diagnosis not present

## 2016-09-10 DIAGNOSIS — Z1239 Encounter for other screening for malignant neoplasm of breast: Secondary | ICD-10-CM

## 2016-09-10 DIAGNOSIS — T148XXA Other injury of unspecified body region, initial encounter: Secondary | ICD-10-CM | POA: Insufficient documentation

## 2016-09-10 HISTORY — DX: Strain of muscle, fascia and tendon at neck level, initial encounter: S16.1XXA

## 2016-09-10 NOTE — Patient Instructions (Addendum)
Ladean RayaConstance, seen today for a tetanus shot, and flu shot and to have your right leg pain and right side of your neck evaluated.  The leg pain seems to be tender specifically in the muscle on the front of your leg and you told me that you have been increasing her exercise lately and I think the pain is likely due to overuse.  I would take Tylenol for this.  Physical right neck pain, I would use Tylenol and a heating pad and this should help with the discomfort.  This is likely just a strained muscle.   I'm glad that you will be getting a tetanus shot and a flu shot today.  I've also given information on how to schedule a mammogram, and I put in a referral for a colonoscopy for you.  We can do a Pap smear at your next visit.      Nice seeing you today, Rande BruntDaniel L. [name], MD [facility] Resident PGY-1 09/10/2016 12:04 PM

## 2016-09-10 NOTE — Assessment & Plan Note (Signed)
Likely to be muscle strain, as patient denies any numbness or tingling going down her upper extremities and denies any trauma.  Pain is worse when turning head to the left moving left ear to left shoulder and tenderness to palpation at the sternomastoid. - Recommended heating pad, Tylenol  - Should resolve on its own

## 2016-09-10 NOTE — Assessment & Plan Note (Signed)
Previously uncontrolled hypertension, presenting today with blood pressure to 137/87. Currently on 10 mg amlodipine, and 20-12.5mg  lisinopril-hydrochlorothiazide 2 times daily. Denies any hyperglycemic episodes, or red flag symptoms consistent with hypertensive emergency or urgency.  - Continue 10 mg amlodipine - Continue 20-12 0.5 mg lisinopril/hydrochlorothiazide 2 times daily

## 2016-09-10 NOTE — Assessment & Plan Note (Signed)
Right leg pain mostly in the tibialis anterior, to lesser extent the calf.  Strength is 3 out of 5 in the right lower extremity with decreased sensation on the lateral aspect of the leg with respect to the medial.  However this appears to be chronic and a residual deficit secondary to her recent stroke. He is also been increasing her intensity of exercise at home.  Offered to fill out some paperwork for a personal care assistant, patient declined this offer. Stated that she has family members to help her home.  - Continue to watch, and try Tylenol

## 2016-09-10 NOTE — Assessment & Plan Note (Signed)
Patient due for a number of overdue health maintenance issues.  Offered a Pap smear today, as patient said that she will schedule this at her follow-up appointment . - Received hepatitis C screening, tetanus shot and flu vaccine today. - Referred patient to gastroenterology for colonoscopy - Provided patient with handout for mammogram which she will call and make an appointment for

## 2016-09-10 NOTE — Progress Notes (Signed)
Subjective:  Kathleen Larson is a 50 y.o. female who presents to the [facility] today with a chief complaint of leg pain and BP follow up  HPI:  Leg pain: Right-sided leg pain in her shin and calf for 2 weeks.  No trauma to the area, but did have a CVA couple of months ago and has residual weakness and sensory deficits.  She does home exercises and states that she has increased frequency and intensity recently.  She has no swelling or erythema.    Hypertension:  Patient takes 10 mg amlodipine, and 20-12.5mg  lisinopril-hydrochlorothiazide 2 times daily. Blood pressure last visit was 142/91, and she denied any hypotensive episodes, or lightheadedness with standing up.  She also denies any chest pain, shortness of breath, changes in vision, or headaches.   Healthcare maintenance: Patient is overdue for hepatitis C screening, Pap smear which she states that she will schedule a follow-up appointment, mammogram, colonoscopy, tetanus shot and flu vaccine.    Right-sided neck pain: Patient notes pain in her right neck for the past week or so it's worse when she looks to the left and when she bends her head to the left. She denies any numbness or tingling down her upper extremities and no trauma to the area.     PMH: CVA, hypertension  Objective:  Physical Exam: BP 137/87 (BP Location: Right Arm, Patient Position: Sitting, Cuff Size: Normal)   Pulse 72   Temp 97.6 F (36.4 C) (Oral)   Wt 224 lb 12.8 oz (102 kg)   LMP 10/14/2012   BMI 38.59 kg/m   Gen: 50 year old female in NAD, resting comfortably CV: RRR with no murmurs appreciated Pulm: NWOB, CTAB with no crackles, wheezes, or rhonchi GI: Normal bowel sounds present. Soft, Nontender, Nondistended. MSK: no edema, cyanosis, or clubbing noted. Right lower extremity strength 3 out of 5. Tenderness to palpation of the tibialis anterior and calf muscles. No swelling or erythema. Right-sided neck pain when turning to the left, tenderness to  palpation on the sternomastoid and increased pain with moving left ear to left shoulder.  Skin: warm, dry Neuro: grossly normal, moves all extremities. Decreased sensation on the right lateral leg compared to the medial aspect of the leg.  No numbness or tingling going down the upper extremities Psych: Normal affect and thought content  No results found for this or any previous visit (from the past 72 hour(s)).   Assessment/Plan:  Leg pain, right Right leg pain mostly in the tibialis anterior, to lesser extent the calf.  Strength is 3 out of 5 in the right lower extremity with decreased sensation on the lateral aspect of the leg with respect to the medial.  However this appears to be chronic and a residual deficit secondary to her recent stroke. He is also been increasing her intensity of exercise at home.  Offered to fill out some paperwork for a personal care assistant, patient declined this offer. Stated that she has family members to help her home.  - Continue to watch, and try Tylenol  Neck muscle strain Likely to be muscle strain, as patient denies any numbness or tingling going down her upper extremities and denies any trauma.  Pain is worse when turning head to the left moving left ear to left shoulder and tenderness to palpation at the sternomastoid. - Recommended heating pad, Tylenol  - Should resolve on its own  Essential hypertension Previously uncontrolled hypertension, presenting today with blood pressure to 137/87. Currently on 10 mg  amlodipine, and 20-12.5mg  lisinopril-hydrochlorothiazide 2 times daily. Denies any hyperglycemic episodes, or red flag symptoms consistent with hypertensive emergency or urgency.  - Continue 10 mg amlodipine - Continue 20-12 0.5 mg lisinopril/hydrochlorothiazide 2 times daily  Healthcare maintenance Patient due for a number of overdue health maintenance issues.  Offered a Pap smear today, as patient said that she will schedule this at her follow-up  appointment . - Received hepatitis C screening, tetanus shot and flu vaccine today. - Referred patient to gastroenterology for colonoscopy - Provided patient with handout for mammogram which she will call and make an appointment for

## 2016-09-11 ENCOUNTER — Encounter: Payer: Self-pay | Admitting: Family Medicine

## 2016-09-11 ENCOUNTER — Ambulatory Visit: Payer: Medicaid Other | Attending: Family Medicine

## 2016-09-11 DIAGNOSIS — I69190 Apraxia following nontraumatic intracerebral hemorrhage: Secondary | ICD-10-CM | POA: Diagnosis present

## 2016-09-11 DIAGNOSIS — I69920 Aphasia following unspecified cerebrovascular disease: Secondary | ICD-10-CM | POA: Insufficient documentation

## 2016-09-11 DIAGNOSIS — I69890 Apraxia following other cerebrovascular disease: Secondary | ICD-10-CM | POA: Insufficient documentation

## 2016-09-11 DIAGNOSIS — R296 Repeated falls: Secondary | ICD-10-CM | POA: Insufficient documentation

## 2016-09-11 DIAGNOSIS — M6281 Muscle weakness (generalized): Secondary | ICD-10-CM | POA: Insufficient documentation

## 2016-09-11 DIAGNOSIS — I69818 Other symptoms and signs involving cognitive functions following other cerebrovascular disease: Secondary | ICD-10-CM | POA: Diagnosis present

## 2016-09-11 DIAGNOSIS — I69851 Hemiplegia and hemiparesis following other cerebrovascular disease affecting right dominant side: Secondary | ICD-10-CM | POA: Diagnosis present

## 2016-09-11 DIAGNOSIS — R278 Other lack of coordination: Secondary | ICD-10-CM | POA: Insufficient documentation

## 2016-09-11 DIAGNOSIS — I69351 Hemiplegia and hemiparesis following cerebral infarction affecting right dominant side: Secondary | ICD-10-CM | POA: Diagnosis present

## 2016-09-11 LAB — HEPATITIS C ANTIBODY: HCV AB: NEGATIVE

## 2016-09-11 NOTE — Therapy (Signed)
[facility] [address] [place], [place], [address] Phone: [phone]   Fax:  [phone]  Speech Language Pathology Treatment  Patient Details  Name: Kathleen Larson MRN: [id number] Date of Birth: 07/23/1966 Referring Provider: Doralee Albino, MD  Encounter Date: 09/11/2016      End of Session - 09/11/16 1251    Visit Number 3   Number of Visits 9   Authorization Time Period 09-11-16 to 11-05-16   Authorization - Visit Number 1   Authorization - Number of Visits 8   SLP Start Time 1148   SLP Stop Time  1230   SLP Time Calculation (min) 42 min   Activity Tolerance Patient tolerated treatment well      Past Medical History:  Diagnosis Date  . Allergy   . GERD (gastroesophageal reflux disease)   . Hypertension     Past Surgical History:  Procedure Laterality Date  . TUBAL LIGATION      There were no vitals filed for this visit.      Subjective Assessment - 09/11/16 1154    Subjective "It a - zhhh - fourth of - zhjuly."               ADULT SLP TREATMENT - 09/11/16 1157      General Information   Behavior/Cognition Alert;Cooperative;Pleasant mood     Treatment Provided   Treatment provided Cognitive-Linquistic     Pain Assessment   Pain Assessment 0-10   Pain Score 7    Pain Location rt leg   Pain Descriptors / Indicators Aching   Pain Intervention(s) Monitored during session     Cognitive-Linquistic Treatment   Treatment focused on Aphasia   Skilled Treatment Pt with significant dysfluency upon entering ST room so SLP focused on one syllable naming with pt. SLP req'd to provide max cues for relaxed voicing and articulatory production even with liquid consonants and other non-stop consonants. What sounded like vocal fold spasm/restriction of glottis heard during session when pt at rest. Success of articulation with words with initial non-stop consants was good (not excellent) for  reduction of dysfluency at the word level (max verbal cues and demo assist necessary). SLP reiterated to pt in order to decr dysfluency she needed to have "smooth, easy, relaxed, gentle" talking. SLP had pt repeat these four words three times for emphasis on what she needed to do, and pt with mod groping for sounds with each of them. Noted pt with multiple remote CVAs in past, with mod white matter ischemic changes as well. Pt asked when she would return to normal and SLP told pt she will likely have at least some stuttering/difficulty speaking, but to expect 9-12 month timeframe for that. She rolled her eyes, shook her head, and sighed when SLP provided this information. SL{P encouraged pt by telling her she was doing all the right things by coming to therapy and doing her homework.     Assessment / Recommendations / Plan   Plan Continue with current plan of care     Progression Toward Goals   Progression toward goals Progressing toward goals          SLP Education - 09/11/16 1251    Education provided Yes   Education Details prognosis for speech normalcy, compensations for apraxia   Person(s) Educated Patient   Methods Explanation;Demonstration;Verbal cues;Handout   Comprehension Verbalized understanding;Returned demonstration;Verbal cues required;Need further instruction            SLP Long  Term Goals - 09/11/16 1153      SLP LONG TERM GOAL #1   Title Pt will complete mildly complex naming tasks with 80% accuracy and rare min A   Baseline 50% accuracy   Time 7   Period Weeks   Status On-going     SLP LONG TERM GOAL #2   Title Pt will utilize compensations for aphasia over 10 minute simple conversation with less than 2 requests for clarificiatoin   Baseline Pt not using compensations for aphasia   Time 7   Period Weeks   Status On-going     SLP LONG TERM GOAL #3   Title Pt will utilize compensations for verbal apraxia over 10 minute conversation to be 90% intelligible with  rare min A   Baseline Pt 75% intelligible at sentence level   Time 7   Period Weeks   Status On-going          Plan - 09/11/16 1252    Clinical Impression Statement Pt continue to require extended time, semantic, phonemic, demo cues for success with verbal expression. Conversation remains halting with distortions, and pt requires ongoing cues to increase utterance length. Continue skilled ST to maximize verbal expression and intellgibilty for improved independence and possible return to work.    Speech Therapy Frequency 1x /week   Duration --  8 weeks   Treatment/Interventions Compensatory strategies;Functional tasks;Patient/family education;Multimodal communcation approach;SLP instruction and feedback;Language facilitation;Compensatory techniques   Potential to Achieve Goals Good   Potential Considerations Financial resources;Severity of impairments   Consulted and Agree with Plan of Care Patient      Patient will benefit from skilled therapeutic intervention in order to improve the following deficits and impairments:   Aphasia following unspecified cerebrovascular disease  Apraxia following other cerebrovascular disease    Problem List Patient Active Problem List   Diagnosis Date Noted  . Leg pain, right 09/10/2016  . Neck muscle strain 09/10/2016  . Healthcare maintenance 09/10/2016  . Neck pain on left side 06/19/2016  . Essential hypertension, benign 05/27/2016  . Medical non-compliance   . Prediabetes   . RUE weakness   . Intracranial vascular stenosis   . Stroke (HCC) 05/16/2016  . Cerebrovascular accident (CVA) (HCC)   . Essential hypertension   . Obesity   . Abnormal EKG   . Hypertension 09/17/2012  . FATIGUE 01/06/2010  . CONSTIPATION 03/22/2009  . HELICOBACTER PYLORI INFECTION, HX OF 01/28/2009  . ALLERGIC RHINITIS 03/02/2008  . ABDOMINAL PAIN 03/02/2008  . Hyperlipemia 07/18/2007    [facility] Center[facility]INKE,Winston Misner ,MS, CCC-SLP  09/11/2016, 12:54 PM  [facility] [address] [place], [place], [address] Phone: [phone]   Fax:  [phone]   Name: Kathleen Larson MRN: [id number] Date of Birth: 10/13/1966

## 2016-09-11 NOTE — Patient Instructions (Signed)
  Please complete the assigned speech therapy homework and return it to your next session.  

## 2016-09-18 ENCOUNTER — Other Ambulatory Visit: Payer: Self-pay | Admitting: Family Medicine

## 2016-09-18 ENCOUNTER — Ambulatory Visit: Payer: Medicaid Other

## 2016-09-18 DIAGNOSIS — I639 Cerebral infarction, unspecified: Secondary | ICD-10-CM

## 2016-09-18 DIAGNOSIS — I69890 Apraxia following other cerebrovascular disease: Secondary | ICD-10-CM

## 2016-09-18 DIAGNOSIS — I69920 Aphasia following unspecified cerebrovascular disease: Secondary | ICD-10-CM | POA: Diagnosis not present

## 2016-09-18 NOTE — Therapy (Signed)
[facility] [address] [place], [place], [address] Phone: [phone]   Fax:  [phone]  Speech Language Pathology Treatment  Patient Details  Name: Kathleen SchoolConstance D Boardley MRN: [id number] Date of Birth: Jul 24, 1966 Referring Provider: Doralee AlbinoWilliam Hensel, MD  Encounter Date: 09/18/2016      End of Session - 09/18/16 1705    Visit Number 4   Number of Visits 9   Date for SLP Re-Evaluation 10/22/16   Authorization Time Period 09-11-16 to 11-05-16   Authorization - Visit Number 3   Authorization - Number of Visits 8      Past Medical History:  Diagnosis Date  . Allergy   . GERD (gastroesophageal reflux disease)   . Hypertension     Past Surgical History:  Procedure Laterality Date  . TUBAL LIGATION      There were no vitals filed for this visit.             ADULT SLP TREATMENT - 09/18/16 1201      General Information   Behavior/Cognition Alert;Cooperative;Pleasant mood     Treatment Provided   Treatment provided Cognitive-Linquistic     Pain Assessment   Pain Assessment 0-10   Pain Score 7    Pain Location rt leg   Pain Descriptors / Indicators Aching   Pain Intervention(s) Monitored during session     Cognitive-Linquistic Treatment   Treatment focused on Aphasia;Other (comment)  dysfluency   Skilled Treatment Significant dysfluency continues today. SLP assessed pt's stimulability for fluency with unison singing and speaking which did not improve pt's fluency. SLP then targeted smooth, gentle onset of voicing with long vowels /u/, /o/, and "ah", with limited success due to audible diaphragmatic and/or vocal spasm throughout productions. SLP instructed pt re: relaxation and attempts at home to achieve gentle voicing onset and to use initial /h/ as well. Pt agreed to do so. She also requests PT and OT evals. "Yes! - - - - Please!" was her response when asked if she would like these evaluations.      Assessment / Recommendations / Plan   Plan Continue with current plan of care     Progression Toward Goals   Progression toward goals Progressing toward goals          SLP Education - 09/18/16 1704    Education provided Yes   Education Details relaxation necessary for fluent speech production, HEP   Person(s) Educated Patient   Methods Explanation;Demonstration;Verbal cues;Handout   Comprehension Verbalized understanding;Verbal cues required;Need further instruction            SLP Long Term Goals - 09/18/16 1707      SLP LONG TERM GOAL #1   Title Pt will complete mildly complex naming tasks with 80% accuracy and rare min A   Baseline 50% accuracy   Time 6   Period Weeks   Status On-going     SLP LONG TERM GOAL #2   Title Pt will utilize compensations for aphasia over 10 minute simple conversation with less than 2 requests for clarificiatoin   Baseline Pt not using compensations for aphasia   Time 6   Period Weeks   Status On-going     SLP LONG TERM GOAL #3   Title Pt will utilize compensations for verbal apraxia over 10 minute conversation to be 90% intelligible with rare min A   Baseline Pt 75% intelligible at sentence level   Time 6   Period Weeks   Status On-going  Plan - 09/18/16 1705    Clinical Impression Statement Orlene ErmConversation remains halting with distortions and severely dysfluent speech, and pt requires ongoing cues to increase utterance length. 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 Continue skilled ST to maximize verbal expression and intellgibilty for improved independence and possible return to work.    Speech Therapy Frequency 1x /week   Duration --  8 weeks   Treatment/Interventions Compensatory strategies;Functional tasks;Patient/family education;Multimodal communcation approach;SLP instruction and feedback;Language facilitation;Compensatory techniques   Potential to Achieve Goals Fair   Potential Considerations Severity  of impairments;Financial resources   Consulted and Agree with Plan of Care Patient      Patient will benefit from skilled therapeutic intervention in order to improve the following deficits and impairments:   Apraxia following other cerebrovascular disease  Aphasia following unspecified cerebrovascular disease    Problem List Patient Active Problem List   Diagnosis Date Noted  . Leg pain, right 09/10/2016  . Neck muscle strain 09/10/2016  . Healthcare maintenance 09/10/2016  . Neck pain on left side 06/19/2016  . Essential hypertension, benign 05/27/2016  . Medical non-compliance   . Prediabetes   . RUE weakness   . Intracranial vascular stenosis   . Stroke (HCC) 05/16/2016  . Cerebrovascular accident (CVA) (HCC)   . Essential hypertension   . Obesity   . Abnormal EKG   . Hypertension 09/17/2012  . FATIGUE 01/06/2010  . CONSTIPATION 03/22/2009  . HELICOBACTER PYLORI INFECTION, HX OF 01/28/2009  . ALLERGIC RHINITIS 03/02/2008  . ABDOMINAL PAIN 03/02/2008  . Hyperlipemia 07/18/2007    Claretta Kendra ,MS, CCC-SLP  09/18/2016, 5:07 PM  [facility] [address] [place], [place], [address] Phone: [phone]   Fax:  [phone]   Name: Kathleen SchoolConstance D Velarde MRN: [id number] Date of Birth: 07-07-1966

## 2016-09-18 NOTE — Patient Instructions (Signed)
Get out some [place] [employer], [employer] music and sing along for 10-15 mintues  Then, take a good comfortable breath and say OOOO, Ohhhhhh, Ahhhhhh, mmmmmm - gentle start and flowing   ---try 10 of these  SOMETIMES TO GET A GENTLE START, helps to put "h" sound at the beginning.   Close your eyes when you do it, lay down on your bed or your couch

## 2016-09-25 ENCOUNTER — Ambulatory Visit: Payer: Medicaid Other | Admitting: Speech Pathology

## 2016-09-25 ENCOUNTER — Encounter: Payer: Self-pay | Admitting: Nurse Practitioner

## 2016-09-25 ENCOUNTER — Ambulatory Visit (INDEPENDENT_AMBULATORY_CARE_PROVIDER_SITE_OTHER): Payer: Self-pay | Admitting: Nurse Practitioner

## 2016-09-25 VITALS — BP 140/82 | HR 92 | Ht 64.0 in | Wt 229.0 lb

## 2016-09-25 DIAGNOSIS — I639 Cerebral infarction, unspecified: Secondary | ICD-10-CM

## 2016-09-25 DIAGNOSIS — I69920 Aphasia following unspecified cerebrovascular disease: Secondary | ICD-10-CM

## 2016-09-25 DIAGNOSIS — I69890 Apraxia following other cerebrovascular disease: Secondary | ICD-10-CM

## 2016-09-25 DIAGNOSIS — E785 Hyperlipidemia, unspecified: Secondary | ICD-10-CM

## 2016-09-25 DIAGNOSIS — I10 Essential (primary) hypertension: Secondary | ICD-10-CM

## 2016-09-25 NOTE — Patient Instructions (Addendum)
Continue aspirin and Plavix for secondary stroke prevention Maintain strict control of hypertension with blood pressure goal below 130/90, today's reading 140/82 continue antihypertensive medications Cholesterol with LDL cholesterol less than 70, followed by primary care,  Lipitor HEP to gradually improve strength, eat healthy diet with whole grains,  fresh fruits and vegetables Discussed risk for recurrent stroke/ TIA and answered additional questions Follow-up in 6 months

## 2016-09-25 NOTE — Progress Notes (Signed)
[facility]  PATIENT: Kathleen Larson DOB: December 21, 1965   REASON FOR VISIT: follow-up for stroke HISTORY FROM: Patient and sister Kathleen Larson    HISTORY OF PRESENT ILLNESS:UPDATE 11/17/2017CM Kathleen Larson, 50 year old female returns for follow-up. She had an admission to the hospital in July for white right upper extremity weakness. 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 She returns today for follow-up. Blood pressure is 140/82 she remains on Norvasc and lisinopril. In addition she is on aspirin 325 and Plavix for secondary stroke prevention without further stroke or TIA symptoms. She remains on Lipitor without complaints of myalgias. She claims she has had a few falls and she does not always use her cane. She is currently getting some speech therapy. She claims she is doing her home exercise program. She returns for reevaluation    HISTORY 06/30/16 CMConstance M Larson is an 50 y.o. female with a history of hypertension who developed right hand numbness the day prior to admission while getting ready for work at approximately 5:30 in the morning. The patient states that she did not awake up with the symptom. She decided to go on to work and her deficits progressed throughout the day. On 05/16/16  she did not feel well and presented to the emergency department for further evaluation. The patient did not describe weakness although there is RUE weakness on exam. Her deficits are confined to the right upper extremity.  A CT scan of the head revealed no evidence of acute intracranial abnormality. Focal hypodensity in the right periventricular white matter overlying the right frontal horn, as well as within the left basal ganglia. MRI HEAD Left PLIC infarct likely secondary to small vessel disease Multiple remote lacunar infarcts involving the bilateral corona radiata, right basal ganglia, left thalamus, and right pons.  MRA HEAD extensive intracranial atherosclerosis.  Carotid Doppler no significant stenosis. 2-D echo no cardiac source of embolism  LDL 195 Hemoglobin A1c 5.9 No anti-thrombotic prior to admission now on aspirin and Plavix for 3 months The patient reported that she did not take her blood pressure medication this a.m. but denied missing any other doses. In the emergency department her blood pressure was significantly elevated and she was placed on a Cleviprex drip. Her last blood pressure was 193/122. She also had sinus tachycardia with rates in the 120 to 130 range. She denied chest pain or shortness of breath. Family members who accompanied the patient reported that her speech did not sound normal but no obvious slurring was detected on exam.She stated that she was on HTN meds before but currently not on it. She denies smoking. She was discharged with physical therapy She returns today for follow-up. Her therapies concluded last week and she is doing her home exercise program , she claims she is compliant with her medications she returns for reevaluation . She has not had further stroke or TIA symptoms   REVIEW OF SYSTEMS: Full 14 system review of systems performed and notable only for those listed, all others are neg:  Constitutional: neg  Cardiovascular: neg Ear/Nose/Throat: neg  Skin: neg Eyesneg Respiratory: neg Gastroitestinal: neg  Hematology/Lymphatic: neg  Endocrine: neg Musculoskeletal: Walking difficulty Allergy/Immunology: neg Neurological: Speech difficulty Psychiatric: Anxiety Sleep :daytime sleepiness  ALLERGIES: Allergies  Allergen Reactions  . Shrimp [Shellfish Allergy] Swelling    Of lips    HOME MEDICATIONS: Outpatient Medications Prior to Visit  Medication Sig Dispense Refill  . amLODipine (NORVASC) 10 MG tablet Take 1 tablet (10 mg total) by  mouth daily. 90 tablet 1  . aspirin 325 MG tablet Take 325 mg by mouth daily.    Marland Kitchen. atorvastatin (LIPITOR) 80 MG tablet TAKE ONE TABLET BY MOUTH ONCE DAILY AT 6:00 PM 30  tablet 2  . clopidogrel (PLAVIX) 75 MG tablet Take 1 tablet (75 mg total) by mouth daily. 90 tablet 1  . lisinopril-hydrochlorothiazide (ZESTORETIC) 20-12.5 MG tablet Take 2 tablets by mouth daily. 60 tablet 2   No facility-administered medications prior to visit.     PAST MEDICAL HISTORY: Past Medical History:  Diagnosis Date  . Allergy   . GERD (gastroesophageal reflux disease)   . Hypertension     PAST SURGICAL HISTORY: Past Surgical History:  Procedure Laterality Date  . TUBAL LIGATION      FAMILY HISTORY: Family History  Problem Relation Age of Onset  . Stroke Mother     52 yo  . Asthma Daughter   . Cancer Maternal Grandmother     SOCIAL HISTORY: Social History   Social History  . Marital status: Single    Spouse name: N/A  . Number of children: N/A  . Years of education: N/A   Occupational History  . [employer]    Social History Main Topics  . Smoking status: Never Smoker  . Smokeless tobacco: Never Used  . Alcohol use Yes  . Drug use: No  . Sexual activity: Not on file   Other Topics Concern  . Not on file   Social History Narrative  . No narrative on file     PHYSICAL EXAM  Vitals:   09/25/16 1110  BP: (!) 152/110  Pulse: 92  Weight: 229 lb (103.9 kg)  Height: 5\' 4"  (1.626 m)   Body mass index is 39.31 kg/m.  Generalized: Well developed, in no acute distress  Head: normocephalic and atraumatic,. Oropharynx benign  Neck: Supple, no carotid bruits  Cardiac: Regular rate rhythm, no murmur  Musculoskeletal: No deformity   Neurological examination   Mentation: Alert oriented to time, place, history taking. Attention span and concentration appropriate. Recent and remote memory intact.  Follows all commands speech With disfluency and hesitancy .   Cranial nerve II-XII: Fundoscopic exam reveals sharp disc margins.Pupils were equal round reactive to light extraocular movements were full, visual field were full on confrontational  test. Mild right nasolabial fold flattening.Hearing was intact to finger rubbing bilaterally. Uvula tongue midline. head turning and shoulder shrug were normal and symmetric.Tongue protrusion into cheek strength was normal. Motor: normal bulk and tone, full strength in the BUE, BLE,  except right upper extremity 4 out of 5 and pronator drift present on the right  Sensory: normal and symmetric to light touch, pinprick, and  Vibration,  mildly decreased on the right  Coordination: finger-nose-ataxia with the right hand Reflexes: 1+ upper lower and symmetric plantar responses were flexor bilaterally. Gait and Station: Rising up from seated position without assistance,  wide based stance,  moderate stride,  smooth turning, able to perform tiptoe, and heel walking without difficulty. Tandem gait mildly unsteady  DIAGNOSTIC DATA (LABS, IMAGING, TESTING) - I reviewed patient records, labs, notes, testing and imaging myself where available.  Lab Results  Component Value Date   WBC 5.9 05/21/2016   HGB 15.0 05/21/2016   HCT 46.9 (H) 05/21/2016   MCV 81.6 05/21/2016   PLT 257 05/21/2016      Component Value Date/Time   NA 141 06/11/2016 1620   K 3.8 06/11/2016 1620   CL 100 06/11/2016 1620  CO2 27 06/11/2016 1620   GLUCOSE 117 (H) 06/11/2016 1620   BUN 16 06/11/2016 1620   CREATININE 1.01 06/11/2016 1620   CALCIUM 9.6 06/11/2016 1620   PROT 7.4 05/21/2016 1349   ALBUMIN 3.7 05/21/2016 1349   AST 24 05/21/2016 1349   ALT 21 05/21/2016 1349   ALKPHOS 93 05/21/2016 1349   BILITOT 1.3 (H) 05/21/2016 1349   GFRNONAA 64 06/11/2016 1620   GFRAA 74 06/11/2016 1620   Lab Results  Component Value Date   CHOL 277 (H) 05/16/2016   HDL 59 05/16/2016   LDLCALC 195 (H) 05/16/2016   TRIG 115 05/16/2016   CHOLHDL 4.7 05/16/2016   Lab Results  Component Value Date   HGBA1C 5.9 (H) 05/16/2016   No results found for: [id number] Lab Results  Component Value Date   TSH 1.381 05/16/2016       ASSESSMENT AND PLAN  50 y.o. 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 The patient is a current patient of Dr.Xu  who is out of the office today . This note is sent to the work in doctor.     PLAN:Continue aspirin and Plavix for secondary stroke prevention Maintain strict control of hypertension with blood pressure goal below 130/90, today's reading 140/82 continue antihypertensive medications Cholesterol with LDL cholesterol less than 70, followed by primary care, continue Lipitor HEP to gradually improve strength, eat healthy diet with whole grains,  fresh fruits and vegetables Discussed risk for recurrent stroke/ TIA and answered additional questions Continue speech therapy, tried to slow down, take deep breaths Follow-up in 6 months  Nilda RiggsNancy Carolyn Caroly Purewal, [facility]oscopyGNP, [facility] WingBC, APRN  [facility] [address], [address] [place], [place] [address] [phone]

## 2016-09-25 NOTE — Progress Notes (Signed)
I have reviewed and agreed above plan. 

## 2016-09-25 NOTE — Therapy (Signed)
[facility] [address] [place], [place], [address] Phone: [phone]   Fax:  [phone]  Speech Language Pathology Treatment  Patient Details  Name: Kathleen Larson MRN: [id number] Date of Birth: 06/20/1966 Referring Provider: Doralee AlbinoWilliam Hensel, MD  Encounter Date: 09/25/2016      End of Session - 09/25/16 1346    Visit Number 5   Number of Visits 9   Date for SLP Re-Evaluation 10/22/16   Authorization - Visit Number 4   Authorization - Number of Visits 8   SLP Start Time 1018   SLP Stop Time  1102   SLP Time Calculation (min) 44 min   Activity Tolerance Patient tolerated treatment well      Past Medical History:  Diagnosis Date  . Allergy   . GERD (gastroesophageal reflux disease)   . Hypertension     Past Surgical History:  Procedure Laterality Date  . TUBAL LIGATION      There were no vitals filed for this visit.      Subjective Assessment - 09/25/16 1028    Subjective "I'm ok"               ADULT SLP TREATMENT - 09/25/16 1029      General Information   Behavior/Cognition Alert;Cooperative;Pleasant mood     Treatment Provided   Treatment provided Cognitive-Linquistic     Pain Assessment   Pain Assessment 0-10   Pain Score 6    Pain Location rt leg   Pain Descriptors / Indicators Aching   Pain Intervention(s) Monitored during session     Cognitive-Linquistic Treatment   Treatment focused on Aphasia;Apraxia  dysfluency   Skilled Treatment Pt wrote senetences for homework with good spelling and grammar - she said she got a little help. Pt reports using writing to augment verbal expression.  Abdominal breathing and relaxation employed to  facilitated more fluent speech - vocal spasms continue in relaxed susutained vowels and pitch glides. Pt instructed to practice abdmonimal breathing to move tension from neck to abdomen during speech.      Assessment / Recommendations / Plan    Plan Continue with current plan of care     Progression Toward Goals   Progression toward goals Progressing toward goals          SLP Education - 09/25/16 1344    Education provided Yes   Education Details abdmonimal breathing, relaxation   Person(s) Educated Patient   Methods Explanation;Demonstration;Verbal cues;Handout   Comprehension Verbalized understanding;Returned demonstration;Verbal cues required;Need further instruction            SLP Long Term Goals - 09/25/16 1345      SLP LONG TERM GOAL #1   Title Pt will complete mildly complex naming tasks with 80% accuracy and rare min A   Baseline 50% accuracy   Time 5   Period Weeks   Status On-going     SLP LONG TERM GOAL #2   Title Pt will utilize compensations for aphasia over 10 minute simple conversation with less than 2 requests for clarificiatoin   Baseline Pt not using compensations for aphasia   Time 5   Period Weeks   Status On-going     SLP LONG TERM GOAL #3   Title Pt will utilize compensations for verbal apraxia over 10 minute conversation to be 90% intelligible with rare min A   Baseline Pt 75% intelligible at sentence level   Time 5   Period Weeks   Status  On-going          Plan - 09/25/16 1345    Clinical Impression Statement Orlene ErmConversation remains halting with distortions and severely dysfluent speech, and pt requires ongoing cues to increase utterance length. 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 Continue skilled ST to maximize verbal expression and intellgibilty for improved independence and possible return to work.    Speech Therapy Frequency 1x /week   Treatment/Interventions Compensatory strategies;Functional tasks;Patient/family education;Multimodal communcation approach;SLP instruction and feedback;Language facilitation;Compensatory techniques   Potential to Achieve Goals Fair   Potential Considerations Severity of impairments;Financial resources   Consulted and Agree  with Plan of Care Patient      Patient will benefit from skilled therapeutic intervention in order to improve the following deficits and impairments:   Apraxia following other cerebrovascular disease  Aphasia following unspecified cerebrovascular disease    Problem List Patient Active Problem List   Diagnosis Date Noted  . Leg pain, right 09/10/2016  . Neck muscle strain 09/10/2016  . Healthcare maintenance 09/10/2016  . Neck pain on left side 06/19/2016  . Essential hypertension, benign 05/27/2016  . Medical non-compliance   . Prediabetes   . RUE weakness   . Intracranial vascular stenosis   . Stroke (HCC) 05/16/2016  . Cerebrovascular accident (CVA) (HCC)   . Essential hypertension   . Obesity   . Abnormal EKG   . Hypertension 09/17/2012  . FATIGUE 01/06/2010  . CONSTIPATION 03/22/2009  . HELICOBACTER PYLORI INFECTION, HX OF 01/28/2009  . ALLERGIC RHINITIS 03/02/2008  . ABDOMINAL PAIN 03/02/2008  . Hyperlipemia 07/18/2007    Blandina Renaldo, Radene JourneyLaura Ann MS, CCC-SLP 09/25/2016, 1:47 PM  [facility] [address] [place], [place], [address] Phone: [phone]   Fax:  [phone]   Name: Kathleen Larson MRN: [id number] Date of Birth: 05/12/1966

## 2016-09-25 NOTE — Patient Instructions (Addendum)
  Your speech may be affected by fatigue - if you have an event in the evening, rest up during the day   ABDOMINAL BREATHING    . Shoulders down - this is a cue to relax . Place your hand on your abdomen - this helps you focus on easy abdominal breath support - the best and most relaxed way to breathe . Breathe in through your nose and fill your belly with air, watching your hand move outward . Breathe out through your mouth and watch your belly move in. An audible "sh"  may help   Think of your belly as a balloon, when you fill with air (inhale), the balloon gets bigger. As the air goes out (exhale), the balloon deflates.  If you are having difficulty coordinating this, lay on your back with a plastic cup on your belly and repeat the above steps, watching you belly move up with inhalation and down with exhalations  Practice breathing in and out in front of a mirror, watching your belly Breathe in for a count of 5 and breathe out for a count of 5  Now as you breathe out, get a picture of relaxing in your mind Feel the constant in-out of your breathing with your belly Picture the tension in your throat and chest evaporate like steam, melting away and FEEL it do so Picture your throat opening up so wide that a grapefruit or softball could fit through your throat.   Practice this throughout the day when you are not having symptoms. For example: in the car, when watching TV, before medications. Regular practice when you are feeling well is important.  Do this relaxation breathing before you start your vowels 3x each  [facility]CHHAAAAA  HHHHEEEEE  [facility]OO  HHHIIIII  [facility]HH[facility]  FFFFFF  [facility]HHH   Use timers in the kitchen - you must use timers for cooking.     There's an App for that: Breathe2relax  Provided by: Clinton SawyerLaura Isolde Skaff. SLP [phone]

## 2016-09-28 ENCOUNTER — Ambulatory Visit: Payer: Medicaid Other | Admitting: *Deleted

## 2016-09-28 DIAGNOSIS — I69920 Aphasia following unspecified cerebrovascular disease: Secondary | ICD-10-CM | POA: Diagnosis not present

## 2016-09-28 DIAGNOSIS — I69190 Apraxia following nontraumatic intracerebral hemorrhage: Secondary | ICD-10-CM

## 2016-09-28 NOTE — Therapy (Signed)
[facility] [address] [place], [place], [address] Phone: [phone]   Fax:  [phone]  Speech Language Pathology Treatment  Patient Details  Name: Kathleen SchoolConstance D [place] MRN: [id number] Date of Birth: 1965-11-12 Referring Provider: Doralee AlbinoWilliam Hensel, MD  Encounter Date: 09/28/2016      End of Session - 09/28/16 1630    Visit Number 6   Number of Visits 9   Date for SLP Re-Evaluation 10/22/16   Authorization - Visit Number 4   Authorization - Number of Visits 8   SLP Start Time 1533   SLP Stop Time  1616   SLP Time Calculation (min) 43 min   Activity Tolerance Patient tolerated treatment well      Past Medical History:  Diagnosis Date  . Allergy   . GERD (gastroesophageal reflux disease)   . Hypertension     Past Surgical History:  Procedure Laterality Date  . TUBAL LIGATION      There were no vitals filed for this visit.      Subjective Assessment - 09/28/16 1542    Pain Score 6    Pain Location Leg   Pain Orientation Right   Pain Descriptors / Indicators Constant;Discomfort   Pain Onset More than a month ago   Pain Frequency Constant   Multiple Pain Sites No               ADULT SLP TREATMENT - 09/28/16 0001      General Information   Behavior/Cognition Alert;Cooperative;Pleasant mood     Treatment Provided   Treatment provided Cognitive-Linquistic     Pain Assessment   Pain Assessment 0-10   Pain Score 6    Pain Location right leg and foot   Pain Descriptors / Indicators Constant;Aching   Pain Intervention(s) Monitored during session     Cognitive-Linquistic Treatment   Treatment focused on Aphasia;Apraxia   Skilled Treatment Pt wrote phrases/simple sentences given a prompt via picture and completed verbally using single words/phrases given clues with mod A utilizing diaphragmatic breathing technique and modified MIT to increase fluency with increased success. Pt with  continued use of writing to augment verbal expression.  Abdominal breathing and relaxation employed during conversation to facilitated more fluent speech.  Vocal spasms and part-word repetitions noted in verbal expression with single words and simple phrases. Pt instructed to continue practice of abdomimal breathing to move tension from neck to abdomen during speech.      Assessment / Recommendations / Plan   Plan Continue with current plan of care              SLP Long Term Goals - 09/28/16 1632      SLP LONG TERM GOAL #1   Title Pt will complete mildly complex naming tasks with 80% accuracy and rare min A   Baseline 50% accuracy   Time 5   Period Weeks   Status On-going     SLP LONG TERM GOAL #2   Title Pt will utilize compensations for aphasia over 10 minute simple conversation with less than 2 requests for clarificiatoin   Baseline Pt not using compensations for aphasia   Time 5   Period Weeks   Status On-going     SLP LONG TERM GOAL #3   Title Pt will utilize compensations for verbal apraxia over 10 minute conversation to be 90% intelligible with rare min A   Baseline Pt 75% intelligible at sentence level   Time 5   Period Weeks  Status On-going          Plan - 09/28/16 1631    Clinical Impression Statement Orlene ErmConversation remains halting with distortions and severely dysfluent speech, and pt requires ongoing cues to increase utterance length. 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 Continue skilled ST to maximize verbal expression and intellgibilty for improved independence and possible return to work.    Speech Therapy Frequency 1x /week   Treatment/Interventions Compensatory strategies;Functional tasks;Patient/family education;Multimodal communcation approach;SLP instruction and feedback;Language facilitation;Compensatory techniques   Potential to Achieve Goals Fair   Potential Considerations Severity of impairments;Financial resources   Consulted and  Agree with Plan of Care Patient      Patient will benefit from skilled therapeutic intervention in order to improve the following deficits and impairments:   Apraxia following nontraumatic intracerebral hemorrhage    Problem List Patient Active Problem List   Diagnosis Date Noted  . Leg pain, right 09/10/2016  . Neck muscle strain 09/10/2016  . Healthcare maintenance 09/10/2016  . Neck pain on left side 06/19/2016  . Essential hypertension, benign 05/27/2016  . Medical non-compliance   . Prediabetes   . RUE weakness   . Intracranial vascular stenosis   . Stroke (HCC) 05/16/2016  . Cerebrovascular accident (CVA) (HCC)   . Essential hypertension   . Obesity   . Abnormal EKG   . Hypertension 09/17/2012  . FATIGUE 01/06/2010  . CONSTIPATION 03/22/2009  . HELICOBACTER PYLORI INFECTION, HX OF 01/28/2009  . ALLERGIC RHINITIS 03/02/2008  . ABDOMINAL PAIN 03/02/2008  . Hyperlipemia 07/18/2007    ADAMS,PAT 09/28/2016, 4:33 PM  [facility] [address] [place], [place], [address] Phone: [phone]   Fax:  [phone]   Name: Kathleen SchoolConstance D Pavlock MRN: [id number] Date of Birth: 07/30/1966

## 2016-10-05 ENCOUNTER — Other Ambulatory Visit: Payer: Self-pay | Admitting: Family Medicine

## 2016-10-05 DIAGNOSIS — I10 Essential (primary) hypertension: Secondary | ICD-10-CM

## 2016-10-05 DIAGNOSIS — I63519 Cerebral infarction due to unspecified occlusion or stenosis of unspecified middle cerebral artery: Secondary | ICD-10-CM

## 2016-10-05 MED ORDER — ATORVASTATIN CALCIUM 80 MG PO TABS: ORAL_TABLET | ORAL | 2 refills | Status: DC

## 2016-10-05 MED ORDER — AMLODIPINE BESYLATE 10 MG PO TABS: 10.0000 mg | ORAL_TABLET | Freq: Every day | ORAL | 1 refills | Status: DC

## 2016-10-05 MED ORDER — CLOPIDOGREL BISULFATE 75 MG PO TABS: 75.0000 mg | ORAL_TABLET | Freq: Every day | ORAL | 1 refills | Status: DC

## 2016-10-05 MED ORDER — LISINOPRIL-HYDROCHLOROTHIAZIDE 20-12.5 MG PO TABS: 2.0000 | ORAL_TABLET | Freq: Every day | ORAL | 2 refills | Status: DC

## 2016-10-05 NOTE — Telephone Encounter (Signed)
Needs refills on: lisonpril, clopidrogel, amlodipine and atorvastatin.  [facility] on [address]

## 2016-10-07 ENCOUNTER — Encounter: Payer: Self-pay | Admitting: *Deleted

## 2016-10-07 ENCOUNTER — Ambulatory Visit: Payer: Medicaid Other | Admitting: Physical Therapy

## 2016-10-07 ENCOUNTER — Ambulatory Visit: Payer: Medicaid Other | Admitting: *Deleted

## 2016-10-07 DIAGNOSIS — I69851 Hemiplegia and hemiparesis following other cerebrovascular disease affecting right dominant side: Secondary | ICD-10-CM

## 2016-10-07 DIAGNOSIS — I69351 Hemiplegia and hemiparesis following cerebral infarction affecting right dominant side: Secondary | ICD-10-CM

## 2016-10-07 DIAGNOSIS — M6281 Muscle weakness (generalized): Secondary | ICD-10-CM

## 2016-10-07 DIAGNOSIS — R296 Repeated falls: Secondary | ICD-10-CM

## 2016-10-07 DIAGNOSIS — R278 Other lack of coordination: Secondary | ICD-10-CM

## 2016-10-07 DIAGNOSIS — I69818 Other symptoms and signs involving cognitive functions following other cerebrovascular disease: Secondary | ICD-10-CM

## 2016-10-07 DIAGNOSIS — I69920 Aphasia following unspecified cerebrovascular disease: Secondary | ICD-10-CM | POA: Diagnosis not present

## 2016-10-07 NOTE — Therapy (Signed)
[facility] [address] [place], [place], [address] Phone: [phone]   Fax:  [phone]  Physical Therapy Evaluation  Patient Details  Name: Kathleen Larson MRN: [id number] Date of Birth: 08/21/1966 Referring Provider: Renne Muscaaniel L Warden, MD  Encounter Date: 10/07/2016      PT End of Session - 10/07/16 1135    Visit Number 1   Number of Visits 9   Date for PT Re-Evaluation 12/02/16   Authorization Type Medicaid - Auth Required   PT Start Time 0930   PT Stop Time 1005   PT Time Calculation (min) 35 min   Equipment Utilized During Treatment Gait belt   Activity Tolerance Patient tolerated treatment well   Behavior [facility] Therapy [facility] for tasks assessed/performed      Past Medical History:  Diagnosis Date  . Allergy   . GERD (gastroesophageal reflux disease)   . Hypertension     Past Surgical History:  Procedure Laterality Date  . TUBAL LIGATION      There were no vitals filed for this visit.       Subjective Assessment - 10/07/16 0931    Subjective Pt is a 50 y/o female who presents to OPPT s/o L CVA resulting in R sided weakness and speech/language deficits.  Pt presents today with c/o difficulty with mobility and ADLs.   Pertinent History HTN   Patient Stated Goals improve mobility, "get back in my heels"   Currently in Pain? Yes   Pain Score 6    Pain Location Leg   Pain Orientation Right   Pain Descriptors / Indicators Discomfort   Pain Type Neuropathic pain;Chronic pain   Pain Onset More than a month ago   Pain Frequency Constant   Aggravating Factors  difficulty sleeping, worse at night   Pain Relieving Factors nothing   Effect of Pain on Daily Activities will monitor however will not directly address            [facility] Assessment - 10/07/16 0934      Assessment   Medical Diagnosis L CVA   Referring Provider Renne Muscaaniel L Warden, MD   Onset Date/Surgical Date 05/16/16   Hand  Dominance Right   Next MD Visit Dec 2017   Prior Therapy current with SLP, no PT since CVA     Precautions   Precautions Fall   Precaution Comments Pt reports attempted to drive and was educated to not drive until released from doctor to do so. "My sister hasn't let me drive again" since attempted to drive to store     Restrictions   Weight Bearing Restrictions No     Balance Screen   Has the patient fallen in the past 6 months Yes   How many times? 4   Has the patient had a decrease in activity level because of a fear of falling?  Yes   Is the patient reluctant to leave their home because of a fear of falling?  No     Home Environment   Living Environment Private residence   Living Arrangements Alone   Available Help at Discharge Family;Available 24 hours/day   Type of Home Apartment   Home Access Stairs to enter   Entrance Stairs-Number of Steps 3   Entrance Stairs-Rails Right;Left;Can reach both   Home Layout One level   Home Equipment [place] - single point     Prior Function   Level of Independence Independent   Vocation Full time employment  Vocation Requirements Worked at [employer]; ran a Systems analyst, sort and lifted packages   Leisure Reading; basketball     Cognition   Overall Cognitive Status Impaired/Different from baseline  expressive difficulty; receptive appears intact   Area of Impairment Attention;Memory;Following commands   Current Attention Level Sustained   Memory Decreased short-term memory   Following Commands Follows multi-step commands inconsistently;Follows multi-step commands with increased time     Observation/Other Assessments   Focus on Therapeutic Outcomes (FOTO)  not completed     Strength   Strength Assessment Site Hip;Knee;Ankle   Right Hip Flexion 3-/5   Left Hip Flexion 5/5   Right/Left Knee Right;Left   Right Knee Flexion 3-/5   Right Knee Extension 3/5   Left Knee Flexion 5/5   Left Knee Extension 5/5   Right Ankle Dorsiflexion 3/5   Left  Ankle Dorsiflexion 5/5     Ambulation/Gait   Ambulation/Gait Yes   Ambulation/Gait Assistance 4: Min guard   Ambulation Distance (Feet) 120 Feet   Assistive device None   Gait Pattern Decreased stance time - right;Decreased step length - left;Decreased weight shift to right;Decreased dorsiflexion - right;Trendelenburg;Poor foot clearance - right  R foot slap   Gait velocity 1.76 ft/sec  18.63 sec   Gait velocity - backwards --     Standardized Balance Assessment   Standardized Balance Assessment Berg Balance Test;Timed Up and Go Test     Berg Balance Test   Sit to Stand Able to stand  independently using hands   Standing Unsupported Able to stand 2 minutes with supervision   Sitting with Back Unsupported but Feet Supported on Floor or Stool Able to sit safely and securely 2 minutes   Stand to Sit Controls descent by using hands   Transfers Able to transfer safely, definite need of hands   Standing Unsupported with Eyes Closed Able to stand 10 seconds with supervision   Standing Ubsupported with Feet Together Needs help to attain position and unable to hold for 15 seconds   From Standing, Reach Forward with Outstretched Arm Reaches forward but needs supervision   From Standing Position, Pick up Object from Floor Unable to try/needs assist to keep balance   From Standing Position, Turn to Look Behind Over each Shoulder Needs supervision when turning   Turn 360 Degrees Needs close supervision or verbal cueing   Standing Unsupported, Alternately Place Feet on Step/Stool Able to complete 4 steps without aid or supervision   Standing Unsupported, One Foot in Front Able to take small step independently and hold 30 seconds   Standing on One Leg Able to lift leg independently and hold > 10 seconds  LLE > 10 sec; RLE < 1 sec   Total Score 30     Timed Up and Go Test   Normal TUG (seconds) 29.06   TUG Comments difficulty with following instructions                            PT Education - 10/07/16 1135    Education provided Yes   Education Details clinical findings and POC, goals of care   Person(s) Educated Patient   Methods Explanation   Comprehension Verbalized understanding          PT Short Term Goals - 10/07/16 1141      PT SHORT TERM GOAL #1   Title pt/family to report compliance with HEP (Target Date for all STGs: 11/04/16)   Baseline no  HEP   Status New     PT SHORT TERM GOAL #2   Title improve BERG balance score to > 37/56 for improved balance and decreased fall risk    Baseline 30/56   Status New     PT SHORT TERM GOAL #3   Title improve timed up and go to < 20 sec with LRAD for imroved mobility    Baseline 29.06 sec   Status New     PT SHORT TERM GOAL #4   Title improve gait velocity to > 2.0 ft/sec for improved mobility    Baseline 1.76 ft/sec   Status New     PT SHORT TERM GOAL #5   Title amb > 250' with LRAD with supervision on level indoor/paved outdoor surfaces for improved community access   Baseline 120' without device with minguard A           PT Long Term Goals - 10/07/16 1305      PT LONG TERM GOAL #1   Title verbalize understanding of CVA risk factors/warning signs (Target Date for all LTGs: 12/02/16)   Baseline no education   Status New     PT LONG TERM GOAL #2   Title improve BERG balance score to > 42/56 for improved balance and decreased fall risk    Baseline 30/56   Status New     PT LONG TERM GOAL #3   Title improve timed up and go to < 16 sec with LRAD for imroved mobility    Baseline 29.06 sec   Status New     PT LONG TERM GOAL #4   Title improve gait velocity to > 2.5 ft/sec for improved mobility    Baseline 1.76 ft/sec     PT LONG TERM GOAL #5   Title amb > 500' with LRAD with supervision on various indoor/outdoor surfaces for improved community access   Baseline 120' without device with minguard A               Plan - 10/07/16 1137    Clinical Impression Statement Pt is a  50 y/o female who presents to OPPT s/p L CVA (ICD-10: I63.9) resulting in residual R hemiparesis.  Pt hospitalized 05/16/16-05/20/16.  Pt demonstrates decreased strength, decreased balance, gait abnormalities, and decreased coordination and motor planning affecting safe, functional mobility.  Will benefit from PT to address deficits listed.   Rehab Potential Good   Clinical Impairments Affecting Rehab Potential severity of deficits, decreased awareness/attention/cognition, insurance visit limitations   PT Frequency 1x / week   PT Duration 8 weeks   PT Treatment/Interventions ADLs/Self Care Home Management;Cryotherapy;Moist Heat;Electrical Stimulation;Cognitive remediation;Neuromuscular re-education;Balance training;Therapeutic exercise;Therapeutic activities;Functional mobility training;Stair training;Gait training;DME Instruction;Patient/family education;Vestibular   PT Next Visit Plan gait training (needs to use cane, forgot on eval), establish HEP for strengthening   Consulted and Agree with Plan of Care Patient;Other (Comment)  no family present      Patient will benefit from skilled therapeutic intervention in order to improve the following deficits and impairments:  Abnormal gait, Decreased balance, Decreased coordination, Decreased mobility, Difficulty walking, Decreased strength, Decreased knowledge of use of DME, Impaired sensation, Decreased safety awareness, Decreased cognition  Visit Diagnosis: Hemiplegia and hemiparesis following cerebral infarction affecting right dominant side (HCC) - Plan: PT plan of care cert/re-cert  Muscle weakness (generalized) - Plan: PT plan of care cert/re-cert  Repeated falls - Plan: PT plan of care cert/re-cert     Problem List Patient Active Problem List   Diagnosis Date  Noted  . Leg pain, right 09/10/2016  . Neck muscle strain 09/10/2016  . Healthcare maintenance 09/10/2016  . Neck pain on left side 06/19/2016  . Essential hypertension, benign  05/27/2016  . Medical non-compliance   . Prediabetes   . RUE weakness   . Intracranial vascular stenosis   . Stroke (HCC) 05/16/2016  . Cerebrovascular accident (CVA) (HCC)   . Essential hypertension   . Obesity   . Abnormal EKG   . Hypertension 09/17/2012  . FATIGUE 01/06/2010  . CONSTIPATION 03/22/2009  . HELICOBACTER PYLORI INFECTION, HX OF 01/28/2009  . ALLERGIC RHINITIS 03/02/2008  . ABDOMINAL PAIN 03/02/2008  . Hyperlipemia 07/18/2007      Clarita CraneStephanie F Trudee Chirino, PT, DPT 10/07/16 1:12 PM    [facility] [address] [place], [place], [address] Phone: [phone]   Fax:  [phone]  Name: Kathleen Larson MRN: [id number] Date of Birth: 1966/04/16

## 2016-10-07 NOTE — Therapy (Signed)
[facility] [address] [place], [place], [address] Phone: [phone]   Fax:  [phone]  Occupational Therapy Evaluation  Patient Details  Name: Kathleen Larson MRN: [id number] Date of Birth: 1966/09/15 Referring Provider: Dr. Reuel Boom L. Myrtie Soman  Encounter Date: 10/07/2016      OT End of Session - 10/07/16 1116    Visit Number 1 (Eval + 8 Visits)   Number of Visits 8   Date for OT Re-Evaluation 12/09/16   Authorization Type Awaiting Medicaid Authorization - requesting Eval + 1x/week for 8 weeks   Authorization - Visit Number --  OT Eval 09/27/16   OT Start Time 0849   OT Stop Time 0930   OT Time Calculation (min) 41 min   Activity Tolerance Patient tolerated treatment well   Behavior During Therapy [facility] for tasks assessed/performed      Past Medical History:  Diagnosis Date  . Allergy   . GERD (gastroesophageal reflux disease)   . Hypertension     Past Surgical History:  Procedure Laterality Date  . TUBAL LIGATION      There were no vitals filed for this visit.      Subjective Assessment - 10/07/16 0853    Subjective  Pt had Ischemic stroke w/ right sided weakness & aphasia on 05/16/16, she is RHD. She was hospitialized from 7/8-7/12/17. She had HHOT/HHPT from [facility]. She has been having out-pt speech therapy and is referred to OT/PT at this time. She presents today staing that she has residual weakness on her R dominant side.   Pertinent History See EPIC; HTN; Falls ("~4x since stroke"); Aphasia   Patient Stated Goals "Get stronger, get back to normal"   Currently in Pain? Yes   Pain Score 6    Pain Location Leg   Pain Orientation Right   Pain Descriptors / Indicators Discomfort  Difficulty sleeping   Pain Type Chronic pain   Pain Onset More than a month ago   Pain Frequency Constant   Aggravating Factors  Worse at night; Difficulty sleeping   Multiple Pain Sites No           [facility] OT Assessment - 10/07/16 0001      Assessment   Diagnosis Ischemic Stroke w/ right sided weakness   Referring Provider Dr. Rande Brunt. Warden   Onset Date 05/16/16   Assessment Pt was admitted to acute care on 05/16/16-05/20/16   Prior Therapy Acute OT/PT/SLP, HHOT/PT, out-pt SLP     Precautions   Precautions Fall   Precaution Comments Pt reports attempted to drive and was educated to not drive until released from doctor to do so. "My sister hasn't let me drive again" since attempted to drive to store     Restrictions   Weight Bearing Restrictions No     Balance Screen   Has the patient fallen in the past 6 months Yes   How many times? 4   Has the patient had a decrease in activity level because of a fear of falling?  Yes   Is the patient reluctant to leave their home because of a fear of falling?  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[employer]; ran a Systems analystmachine, sort and lifted packages   Leisure Reading; basketball     ADL   Eating/Feeding Modified independent  Pt reports she sometimes "gets choked", on drinks/food   Grooming Supervision/safety   Upper Body Bathing Supervision/safety   Lower Body Bathing Supervision/safety   Upper Body Dressing Supervision/safety;Minimal assistance   Lower Body Dressing Supervision/safety;Minimal assistance  Assist tying shoes   Toilet Tranfer Modified independent  Wearing only sweats "I  can do that" other would be difficult   Toileting - Clothing Manipulation Increased time   Toileting -  Hygiene Increase time   Tub/Shower Transfer Supervision/safety   ADL comments Pt is overall supervision level - min A for safety w/ ADL's      IADL   Prior Level of Function Shopping Mod I prior, requires assist now   Shopping Assistance for transportation;Needs to be accompanied on any shopping trip   Prior Level of Function Light Housekeeping Mod I    Light Housekeeping Performs light daily tasks such as dishwashing, bed making  Needs assistance now   Prior Level of Function Meal Prep Mod I   Meal Prep Needs to have meals prepared and served   Prior Level of Function Community Mobility Mod I   Community Mobility Relies on family or friends for transportation   Medication Management Takes responsibility if medication is prepared in advance in seperate dosage;Has difficulty remembering to take medication;Is not capable of dispensing or managing own medication   Financial Management Has difficulty remembering to take medication;Is not capable of dispensing or managing own medication;Requires assistance     Mobility   Mobility Status History of falls     Written Expression   Dominant Hand Right     Vision - History   Baseline Vision Wears glasses only for reading   Visual History Other (comment)  Pt reports h/o ocassional blurred vision     Activity Tolerance   Activity Tolerance Comments Decreased endurance, fatugues easily. Requires rest breaks     Cognition   Overall Cognitive Status Impaired/Different from baseline   Attention --  Easily distracted    Memory Impaired   Memory Impairment Decreased recall of new information   Awareness Impaired   Behaviors Other (comment)  ?Motor planning impairment vs cognitive deficits,cont to A      Observation/Other Assessments   Other Surveys  Select     Sensation   Light Touch Appears Intact  Pt states "It doesn't feel normal"  RUE light touch     Coordination   9 Hole Peg Test Right;Left   Right 9 Hole Peg Test 39.35 seconds   Left 9 Hole Peg Test 30.97 seconds   Coordination Difficulty following instructions during 9 hole peg test noted     ROM / Strength   AROM / PROM / Strength AROM;Strength     AROM   Overall AROM  Deficits   Overall AROM Comments Pt with ~75% AROM RUE noted     Strength   Overall Strength Deficits   Overall Strength Comments RUE Strength grossly 3-/5 via gross MMT      Hand Function  Right Hand Grip (lbs) 21#   Right Hand Lateral Pinch 9.5 lbs   Right Hand 3 Point Pinch 5 lbs   Left Hand Grip (lbs) 81#   Left Hand Lateral Pinch 23 lbs   Left 3 point pinch 21 lbs                         OT Education - 10/07/16 1115    Education provided Yes   Education Details Results, findings & recommendations reviewed from OT Assessment   Person(s) Educated Patient   Methods Explanation;Demonstration   Comprehension Verbalized understanding;Need further instruction          OT Short Term Goals - 10/07/16 1154      OT SHORT TERM GOAL #1   Title Pt will be Mod I signs and symptoms of CVA    Baseline Dependent   Time 4   Period Weeks   Status New     OT SHORT TERM GOAL #2   Title Pt will be Mod I HEP for RUE FM/coordination   Baseline Dependent   Time 4   Period Weeks   Status New     OT SHORT TERM GOAL #3   Title Pt will be Mod I HEP for RUE proximal strengthening and grip as seen by improved grip strength by 5# or more R hand   Baseline Dependent/See Eval 10/07/16   Time 4   Period Weeks   Status New     OT SHORT TERM GOAL #4   Title Pt will state 2-3 memory strategies given Min A/Vc's   Baseline 10/07/16 Unable   Time 4   Period Weeks   Status New           OT Long Term Goals - 10/07/16 1147      OT LONG TERM GOAL #1   Title Pt will demonstrate improved coordination as seen by improved 9 hole peg score by 5 seconds or more RUE    Baseline See Eval 10/07/16   Time 8   Period Weeks   Status New     OT LONG TERM GOAL #2   Title Pt will be Mod I LB ADL's (tying shoes and fastening buttons/snaps).   Baseline Supervision-Min A 10/07/16   Time 8   Period Weeks   Status New     OT LONG TERM GOAL #3   Title Pt will be Mod I simple snack or meal prep while maintaining safety and balance in ADL kitchen   Baseline 10/07/16 Family preparing meals   Time 8   Period Weeks   Status New     OT LONG TERM GOAL #4   Title Pt will independently state 2-3 memory strategies & implement during ADL's   Baseline 10/07/16 Supervision-min A   Time 8   Period Weeks   Status New     OT LONG TERM GOAL #5   Title Pt will demonstrate improved RUE strength as seen by MMT of 4/5 throughout   Baseline 10/07/16 3-/5 RUE   Time 8   Period Weeks   Status New               Plan - 10/07/16 1121    Clinical Impression Statement Pt is a 50 y/o RHD female s/p Ischemic Stroke (I63.9) with right sided weakness (left PLIC Infarct), aphasia and balance issues. She was hospitialized 05/16/16 through 05/20/16. She presents with deficits in A/ROM, strength, fine motor coordination, aphasia, cognition,  has had 4 falls at home, all impacting her ability to perform ADL's, IADL's. She has her own apartment and lived alone prior to this CVA, but family (sisters, cousins and daughter) have all been providing supervision- min A level of care. She should benfit from out-pt OT to address deficits and assist with increased independence with ADL's and functional activities.    Rehab Potential Fair   Clinical Impairments Affecting Rehab Potential Decreaed awareness of deficits; cognition   OT Frequency 1x / week  May need to extend certification over longer period of time secondary to approaching Holidays &/or insurance restrictions.   OT Duration 8 weeks   OT Treatment/Interventions Self-care/ADL training;DME and/or AE instruction;Splinting;Patient/family  education;Balance training;Therapeutic exercises;Therapeutic exercise;Therapeutic activities;Cognitive remediation/compensation;Passive range of motion;Neuromuscular education;Functional Mobility Training;Energy conservation;Manual Therapy;Visual/perceptual remediation/compensation;Electrical Stimulation   Plan Instruct in home program for A/ROM RUE & FM/Coordination    Consulted and Agree with Plan of Care Patient      Patient will benefit from skilled therapeutic intervention in order to improve the following deficits and impairments:  Decreased coordination, Decreased range of motion, Decreased endurance, Decreased safety awareness, Impaired sensation, Decreased knowledge of precautions, Decreased activity tolerance, Decreased balance, Decreased knowledge of use of DME, Impaired UE functional use, Pain, Impaired vision/preception, Impaired perceived functional ability, Decreased strength, Decreased cognition, Decreased mobility  Visit Diagnosis: Other symptoms and signs involving cognitive functions following other cerebrovascular disease - Plan: Ot plan of care cert/re-cert  Hemiplegia and hemiparesis following other cerebrovascular disease affecting right dominant side (HCC) - Plan: Ot plan of care cert/re-cert  Other lack of coordination - Plan: Ot plan of care cert/re-cert  Muscle weakness (generalized) - Plan: Ot plan of care cert/re-cert    Problem List Patient Active Problem List   Diagnosis Date Noted  . Leg pain, right 09/10/2016  . Neck muscle strain 09/10/2016  . Healthcare maintenance 09/10/2016  . Neck pain on left side 06/19/2016  . Essential hypertension, benign 05/27/2016  . Medical non-compliance   . Prediabetes   . RUE weakness   . Intracranial vascular stenosis   . Stroke (HCC) 05/16/2016  . Cerebrovascular accident (CVA) (HCC)   . Essential hypertension   . Obesity   . Abnormal EKG   . Hypertension 09/17/2012  . FATIGUE 01/06/2010  . CONSTIPATION  03/22/2009  . HELICOBACTER PYLORI INFECTION, HX OF 01/28/2009  . ALLERGIC RHINITIS 03/02/2008  . ABDOMINAL PAIN 03/02/2008  . Hyperlipemia 07/18/2007    Mariam Dollar Beth Dixon, OTR/L 10/07/2016, 12:10 PM  [facility] [address] [place], [place], [address] Phone: [phone]   Fax:  [phone]  Name: Kathleen Larson MRN: [id number] Date of Birth: 1966-06-04

## 2016-10-09 ENCOUNTER — Ambulatory Visit: Payer: Medicaid Other | Attending: Family Medicine

## 2016-10-09 DIAGNOSIS — R278 Other lack of coordination: Secondary | ICD-10-CM | POA: Diagnosis present

## 2016-10-09 DIAGNOSIS — I69351 Hemiplegia and hemiparesis following cerebral infarction affecting right dominant side: Secondary | ICD-10-CM | POA: Diagnosis present

## 2016-10-09 DIAGNOSIS — M6281 Muscle weakness (generalized): Secondary | ICD-10-CM | POA: Diagnosis present

## 2016-10-09 DIAGNOSIS — I69190 Apraxia following nontraumatic intracerebral hemorrhage: Secondary | ICD-10-CM | POA: Insufficient documentation

## 2016-10-09 DIAGNOSIS — I69920 Aphasia following unspecified cerebrovascular disease: Secondary | ICD-10-CM | POA: Insufficient documentation

## 2016-10-09 DIAGNOSIS — R296 Repeated falls: Secondary | ICD-10-CM | POA: Insufficient documentation

## 2016-10-09 NOTE — Patient Instructions (Signed)
Try these with tapping - count out loud "1-2-3" then say the word   - tap slowly  Lunchtime  Envelope  Razor  Hello  Rise and shine  Needle and Thread  Wrist watch    Hammer  Umbrella  Flowers  Psychologist, clinicalocketbook  Newspaper  Bar of soap  Mirror  Matches

## 2016-10-12 NOTE — Therapy (Signed)
[facility] [address] [place], [place], [address] Phone: [phone]   Fax:  [phone]  Speech Language Pathology Treatment  Patient Details  Name: Kathleen Larson MRN: [id number] Date of Birth: 09/18/1966 Referring Provider: Doralee AlbinoWilliam Hensel, MD  Encounter Date: 10/09/2016      End of Session - 10/12/16 1643    Visit Number 7   Number of Visits 9   Date for SLP Re-Evaluation 10/22/16   Authorization Time Period 09-11-16 to 11-05-16   Authorization - Visit Number 6   Authorization - Number of Visits 8   SLP Start Time 1150   SLP Stop Time  1230   SLP Time Calculation (min) 40 min   Activity Tolerance Patient tolerated treatment well      Past Medical History:  Diagnosis Date  . Allergy   . GERD (gastroesophageal reflux disease)   . Hypertension     Past Surgical History:  Procedure Laterality Date  . TUBAL LIGATION      There were no vitals filed for this visit.             ADULT SLP TREATMENT - 10/12/16 0001      General Information   Behavior/Cognition Alert;Cooperative;Pleasant mood     Treatment Provided   Treatment provided Cognitive-Linquistic     Cognitive-Linquistic Treatment   Treatment focused on Aphasia;Apraxia   Skilled Treatment Pt with verbal expression slightly less labored than last session, still is difficult for listener to attend to pt's message from distracting nature of pt's dysfluency. SLP had pt tap on her leg simultaneously with SLP on his leg for improved speech production by rhythm production. SLP explained to pt why a tapping method may assist pt in producing more fluent speech. In 2-3 syllable words pt was 60% fluent using tapping as a cue. SLP encouraged pt to cont to practice with words using tapping method     Assessment / Recommendations / Plan   Plan Continue with current plan of care     Progression Toward Goals   Progression toward goals Progressing  toward goals              SLP Long Term Goals - 10/12/16 1645      SLP LONG TERM GOAL #1   Title Pt will complete mildly complex naming tasks with 80% accuracy and rare min A   Baseline 50% accuracy   Time 4   Period Weeks   Status On-going     SLP LONG TERM GOAL #2   Title Pt will utilize compensations for aphasia over 10 minute simple conversation with less than 2 requests for clarificiatoin   Baseline Pt not using compensations for aphasia   Time 4   Period Weeks   Status On-going     SLP LONG TERM GOAL #3   Title Pt will utilize compensations for verbal apraxia over 10 minute conversation to be 90% intelligible with rare min A   Baseline Pt 75% intelligible at sentence level   Time 4   Period Weeks   Status On-going          Plan - 10/12/16 1643    Clinical Impression Statement Conversation, although slightly improved from last session, remains halting with distortions and severely dysfluent speech, and pt requires ongoing cues to increase utterance length. 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 Today, pt was more fluent when tapping on her leg with verbalizing 2-3 syllable words. Continue skilled ST to maximize  verbal expression and intellgibilty for improved independence and possible return to work.    Speech Therapy Frequency 1x /week   Treatment/Interventions Compensatory strategies;Functional tasks;Patient/family education;Multimodal communcation approach;SLP instruction and feedback;Language facilitation;Compensatory techniques   Potential to Achieve Goals Fair   Potential Considerations Severity of impairments;Financial resources   Consulted and Agree with Plan of Care Patient      Patient will benefit from skilled therapeutic intervention in order to improve the following deficits and impairments:   Apraxia following nontraumatic intracerebral hemorrhage  Aphasia following unspecified cerebrovascular disease    Problem List Patient  Active Problem List   Diagnosis Date Noted  . Leg pain, right 09/10/2016  . Neck muscle strain 09/10/2016  . Healthcare maintenance 09/10/2016  . Neck pain on left side 06/19/2016  . Essential hypertension, benign 05/27/2016  . Medical non-compliance   . Prediabetes   . RUE weakness   . Intracranial vascular stenosis   . Stroke (HCC) 05/16/2016  . Cerebrovascular accident (CVA) (HCC)   . Essential hypertension   . Obesity   . Abnormal EKG   . Hypertension 09/17/2012  . FATIGUE 01/06/2010  . CONSTIPATION 03/22/2009  . HELICOBACTER PYLORI INFECTION, HX OF 01/28/2009  . ALLERGIC RHINITIS 03/02/2008  . ABDOMINAL PAIN 03/02/2008  . Hyperlipemia 07/18/2007    [facility] LPCHINKE,CARL ,MS, CCC-SLP  10/12/2016, 4:45 PM  [facility] [address] [place], [place], [address] Phone: [phone]   Fax:  [phone]   Name: Kathleen Larson MRN: [id number] Date of Birth: July 26, 1966

## 2016-10-16 ENCOUNTER — Ambulatory Visit: Payer: Medicaid Other | Admitting: Speech Pathology

## 2016-10-16 DIAGNOSIS — I69920 Aphasia following unspecified cerebrovascular disease: Secondary | ICD-10-CM

## 2016-10-16 DIAGNOSIS — I69190 Apraxia following nontraumatic intracerebral hemorrhage: Secondary | ICD-10-CM | POA: Diagnosis not present

## 2016-10-16 NOTE — Therapy (Signed)
[facility] [address] [place], [place], [address] Phone: [phone]   Fax:  [phone]  Speech Language Pathology Treatment  Patient Details  Name: Kathleen SchoolConstance D Navarra MRN: [id number] Date of Birth: 04/01/1966 Referring Provider: Doralee AlbinoWilliam Hensel, MD  Encounter Date: 10/16/2016      End of Session - 10/16/16 1244    SLP Start Time 1150   SLP Stop Time  1231   SLP Time Calculation (min) 41 min      Past Medical History:  Diagnosis Date  . Allergy   . GERD (gastroesophageal reflux disease)   . Hypertension     Past Surgical History:  Procedure Laterality Date  . TUBAL LIGATION      There were no vitals filed for this visit.      Subjective Assessment - 10/16/16 1151    Subjective "I think a little better" re: talking               ADULT SLP TREATMENT - 10/16/16 1151      General Information   Behavior/Cognition Alert;Cooperative;Pleasant mood     Treatment Provided   Treatment provided Cognitive-Linquistic     Pain Assessment   Pain Assessment 0-10   Pain Score 7    Pain Location right leg and foot   Pain Descriptors / Indicators Constant;Aching   Pain Intervention(s) Monitored during session     Cognitive-Linquistic Treatment   Treatment focused on Aphasia;Apraxia   Skilled Treatment Facilitated slow rate and tapping with verbal categorization task with occasional min A. Speech less labored, however dysfluencies persist.. Mildly comlex naming tasks with naming 8-10 items in a category with rare min A. Trained pt in easy onset and prolong initial consonant to reduce dysfluency with mod A.      Assessment / Recommendations / Plan   Plan Continue with current plan of care     Progression Toward Goals   Progression toward goals Progressing toward goals          SLP Education - 10/16/16 1242    Education provided Yes   Education Details easy onset of initial consonants to reduce  dysfluency; CVA ed   Person(s) Educated Patient   Methods Explanation;Handout   Comprehension Verbalized understanding            SLP Long Term Goals - 10/16/16 1243      SLP LONG TERM GOAL #1   Title Pt will complete mildly complex naming tasks with 80% accuracy and rare min A   Baseline 50% accuracy   Time 3   Period Weeks   Status Achieved     SLP LONG TERM GOAL #2   Title Pt will utilize compensations for aphasia over 10 minute simple conversation with less than 2 requests for clarificiatoin   Baseline Pt not using compensations for aphasia   Time 3   Period Weeks   Status On-going     SLP LONG TERM GOAL #3   Title Pt will utilize compensations for verbal apraxia over 10 minute conversation to be 90% intelligible with rare min A   Baseline Pt 75% intelligible at sentence level   Time 3   Period Weeks   Status On-going          Plan - 10/16/16 1244    Clinical Impression Statement Pt continues to require ongoing cueing to utlize compensations for verbal apraxia/neurogenic stuttering. Aphasia improved on naming tasks. Continue skilled ST to maximize intellgibility for improved independence.  Patient will benefit from skilled therapeutic intervention in order to improve the following deficits and impairments:   Apraxia following nontraumatic intracerebral hemorrhage  Aphasia following unspecified cerebrovascular disease    Problem List Patient Active Problem List   Diagnosis Date Noted  . Leg pain, right 09/10/2016  . Neck muscle strain 09/10/2016  . Healthcare maintenance 09/10/2016  . Neck pain on left side 06/19/2016  . Essential hypertension, benign 05/27/2016  . Medical non-compliance   . Prediabetes   . RUE weakness   . Intracranial vascular stenosis   . Stroke (HCC) 05/16/2016  . Cerebrovascular accident (CVA) (HCC)   . Essential hypertension   . Obesity   . Abnormal EKG   . Hypertension 09/17/2012  . FATIGUE 01/06/2010  . CONSTIPATION  03/22/2009  . HELICOBACTER PYLORI INFECTION, HX OF 01/28/2009  . ALLERGIC RHINITIS 03/02/2008  . ABDOMINAL PAIN 03/02/2008  . Hyperlipemia 07/18/2007    Lovvorn, Radene JourneyLaura Ann MS, CCC-SLP 10/16/2016, 12:45 PM  [facility] [address] [place], [place], [address] Phone: [phone]   Fax:  [phone]   Name: Kathleen SchoolConstance D Fazzini MRN: [id number] Date of Birth: 31-Mar-1966

## 2016-10-22 ENCOUNTER — Ambulatory Visit: Payer: Medicaid Other | Admitting: Occupational Therapy

## 2016-10-22 DIAGNOSIS — R278 Other lack of coordination: Secondary | ICD-10-CM

## 2016-10-22 DIAGNOSIS — I69351 Hemiplegia and hemiparesis following cerebral infarction affecting right dominant side: Secondary | ICD-10-CM

## 2016-10-22 DIAGNOSIS — M6281 Muscle weakness (generalized): Secondary | ICD-10-CM

## 2016-10-22 DIAGNOSIS — I69190 Apraxia following nontraumatic intracerebral hemorrhage: Secondary | ICD-10-CM | POA: Diagnosis not present

## 2016-10-22 NOTE — Patient Instructions (Signed)
  Coordination Activities  Perform the following activities for 10-15 minutes 2 times per day with right hand(s).   Toss ball between hands.  Toss ball in air and catch with the same hand.  Flip cards 1 at a time as fast as you can.  Deal cards with your thumb (Hold deck in hand and push card off top with thumb).  Pick up coins and stack.  Practice writing  Practice flipping pen around in hand    Try to eat with Rt hand Fold clothes using both hands   1. Grip Strengthening (Resistive Putty)   Squeeze putty using thumb and all fingers. Repeat _20___ times. Do __2__ sessions per day.   2. Roll putty into tube on table and pinch between each finger and thumb x 10 reps each.  (do ring and small finger together)  SHOULDER: Flexion - Sitting    Hold cane with both hands. Raise arms up evenly and straight. Keep elbows straight. Hold __2_ seconds. NO WEIGHT __10_ reps per set, __2_ sets per day   Abduction (Eccentric) - Active (Cane)    Lift cane out to side with affected arm. Avoid hiking shoulder. Keep palm relaxed. Slowly lower affected arm for 3-5 seconds. _10__ reps per set, _2__ sets per day

## 2016-10-22 NOTE — Therapy (Signed)
[facility] [address] [place], [place], [address] Phone: [phone]   Fax:  [phone]  Occupational Therapy Treatment  Patient Details  Name: Kathleen SchoolConstance D Larson MRN: [id number] Date of Birth: [birth year] Referring Provider: Dr. Reuel Boomaniel L. [name]  Encounter Date: 10/22/2016      OT End of Session - 10/22/16 1152    Visit Number 2   Number of Visits 9   Date for OT Re-Evaluation 12/15/16   Authorization Type MCD approved 8 visits from 10/21/16 - 12/15/16   Authorization Time Period 1   Authorization - Visit Number 8   OT Start Time 1105   OT Stop Time 1145   OT Time Calculation (min) 40 min   Activity Tolerance Patient tolerated treatment well      Past Medical History:  Diagnosis Date  . Allergy   . GERD (gastroesophageal reflux disease)   . Hypertension     Past Surgical History:  Procedure Laterality Date  . TUBAL LIGATION      There were no vitals filed for this visit.      Subjective Assessment - 10/22/16 1108    Pertinent History See EPIC; HTN; Falls ("~4x since stroke"); Aphasia   Patient Stated Goals "Get stronger, get back to normal"   Currently in Pain? Yes   Pain Score 6    Pain Location Leg   Pain Orientation Right                      OT Treatments/Exercises (OP) - 10/22/16 0001      ADLs   ADL Comments Reviewed s/s of CVA and risk factors with patient     Exercises   Exercises --  SEE Pt instructions for putty and UE HEP     Fine Motor Coordination   Other Fine Motor Exercises see pt instructions for coordination HEP. Pt with motor apraxia and required cues to help with this (especially tossing ball)                OT Education - 10/22/16 1143    Education provided Yes   Education Details Coordination, putty, and AA/ROM UE HEP   Person(s) Educated Patient   Methods Explanation;Demonstration;Verbal cues;Handout   Comprehension Verbalized  understanding;Returned demonstration;Verbal cues required          OT Short Term Goals - 10/22/16 1154      OT SHORT TERM GOAL #1   Title Pt will be Mod I signs and symptoms of CVA    Baseline Dependent   Time 4   Period Weeks   Status On-going     OT SHORT TERM GOAL #2   Title Pt will be Mod I HEP for RUE FM/coordination   Baseline Dependent   Time 4   Period Weeks   Status On-going     OT SHORT TERM GOAL #3   Title Pt will be Mod I HEP for RUE proximal strengthening and grip as seen by improved grip strength by 5# or more R hand   Baseline Dependent/See Eval 10/07/16   Time 4   Period Weeks   Status New     OT SHORT TERM GOAL #4   Title Pt will state 2-3 memory strategies given Min A/Vc's   Baseline 10/07/16 Unable   Time 4   Period Weeks   Status New           OT Long Term Goals - 10/07/16 1147  OT LONG TERM GOAL #1   Title Pt will demonstrate improved coordination as seen by improved 9 hole peg score by 5 seconds or more RUE   Baseline See Eval 10/07/16   Time 8   Period Weeks   Status New     OT LONG TERM GOAL #2   Title Pt will be Mod I LB ADL's (tying shoes and fastening buttons/snaps).   Baseline Supervision-Min A 10/07/16   Time 8   Period Weeks   Status New     OT LONG TERM GOAL #3   Title Pt will be Mod I simple snack or meal prep while maintaining safety and balance in ADL kitchen   Baseline 10/07/16 Family preparing meals   Time 8   Period Weeks   Status New     OT LONG TERM GOAL #4   Title Pt will independently state 2-3 memory strategies & implement during ADL's   Baseline 10/07/16 Supervision-min A   Time 8   Period Weeks   Status New     OT LONG TERM GOAL #5   Title Pt will demonstrate improved RUE strength as seen by MMT of 4/5 throughout   Baseline 10/07/16 3-/5 RUE   Time 8   Period Weeks   Status New               Plan - 10/22/16 1154    Clinical Impression Statement Pt demo apraxia during coordination  tasks. Pt improves with repetition.    Rehab Potential Fair   Clinical Impairments Affecting Rehab Potential Decreaed awareness of deficits; cognition   OT Frequency 2x / week   OT Duration 8 weeks   OT Treatment/Interventions Self-care/ADL training;DME and/or AE instruction;Splinting;Patient/family education;Balance training;Therapeutic exercises;Therapeutic exercise;Therapeutic activities;Cognitive remediation/compensation;Passive range of motion;Neuromuscular education;Functional Mobility Training;Energy conservation;Manual Therapy;Visual/perceptual remediation/compensation;Electrical Stimulation   Plan review HEP, practice tying shoes, functional use of RUE (following session: memory strategies prn, grip strength)    Consulted and Agree with Plan of Care Patient      Patient will benefit from skilled therapeutic intervention in order to improve the following deficits and impairments:  Decreased coordination, Decreased range of motion, Decreased endurance, Decreased safety awareness, Impaired sensation, Decreased knowledge of precautions, Decreased activity tolerance, Decreased balance, Decreased knowledge of use of DME, Impaired UE functional use, Pain, Impaired vision/preception, Impaired perceived functional ability, Decreased strength, Decreased cognition, Decreased mobility  Visit Diagnosis: Hemiplegia and hemiparesis following cerebral infarction affecting right dominant side (HCC)  Other lack of coordination  Muscle weakness (generalized)    Problem List Patient Active Problem List   Diagnosis Date Noted  . Leg pain, right 09/10/2016  . Neck muscle strain 09/10/2016  . Healthcare maintenance 09/10/2016  . Neck pain on left side 06/19/2016  . Essential hypertension, benign 05/27/2016  . Medical non-compliance   . Prediabetes   . RUE weakness   . Intracranial vascular stenosis   . Stroke (HCC) 05/16/2016  . Cerebrovascular accident (CVA) (HCC)   . Essential hypertension    . Obesity   . Abnormal EKG   . Hypertension 09/17/2012  . FATIGUE 01/06/2010  . CONSTIPATION 03/22/2009  . HELICOBACTER PYLORI INFECTION, HX OF 01/28/2009  . ALLERGIC RHINITIS 03/02/2008  . ABDOMINAL PAIN 03/02/2008  . Hyperlipemia 07/18/2007    Kelli ChurnBallie, Mackay Hanauer Johnson, OTR/L 10/22/2016, 11:58 AM  [facility] [address] [place], [place], [address] Phone: [phone]   Fax:  [phone]  Name: Kathleen Larson MRN: [id number] Date of Birth: [birth year]

## 2016-10-23 ENCOUNTER — Ambulatory Visit: Payer: Medicaid Other

## 2016-10-23 ENCOUNTER — Encounter: Payer: Self-pay | Admitting: Physical Therapy

## 2016-10-23 ENCOUNTER — Ambulatory Visit: Payer: Medicaid Other | Admitting: Physical Therapy

## 2016-10-23 DIAGNOSIS — I69190 Apraxia following nontraumatic intracerebral hemorrhage: Secondary | ICD-10-CM | POA: Diagnosis not present

## 2016-10-23 DIAGNOSIS — I69351 Hemiplegia and hemiparesis following cerebral infarction affecting right dominant side: Secondary | ICD-10-CM

## 2016-10-23 DIAGNOSIS — R296 Repeated falls: Secondary | ICD-10-CM

## 2016-10-23 DIAGNOSIS — I69920 Aphasia following unspecified cerebrovascular disease: Secondary | ICD-10-CM

## 2016-10-23 DIAGNOSIS — M6281 Muscle weakness (generalized): Secondary | ICD-10-CM

## 2016-10-23 NOTE — Therapy (Signed)
[facility] [address] [place], [place], [address] Phone: [phone]   Fax:  [phone]  Physical Therapy Treatment  Patient Details  Name: Kathleen SchoolConstance D Larson MRN: [id number] Date of Birth: 01/14/1966 Referring Provider: Renne Muscaaniel L Warden, MD  Encounter Date: 10/23/2016      PT End of Session - 10/23/16 1110    Visit Number 2   Number of Visits 9   Date for PT Re-Evaluation 12/02/16   Authorization Type Medicaid - Auth Required   PT Start Time 1104   PT Stop Time 1145   PT Time Calculation (min) 41 min   Equipment Utilized During Treatment Gait belt   Activity Tolerance Patient tolerated treatment well   Behavior During Therapy [facility] for tasks assessed/performed      Past Medical History:  Diagnosis Date  . Allergy   . GERD (gastroesophageal reflux disease)   . Hypertension     Past Surgical History:  Procedure Laterality Date  . TUBAL LIGATION      There were no vitals filed for this visit.      Subjective Assessment - 10/23/16 1107    Subjective No new complaints. No falls to report. Has some right LE pain   Pertinent History HTN   Patient Stated Goals improve mobility, "get back in my heels"   Currently in Pain? Yes   Pain Score 7    Pain Location Leg   Pain Orientation Right   Pain Descriptors / Indicators Discomfort   Pain Type Neuropathic pain;Chronic pain   Pain Onset More than a month ago   Pain Frequency Constant   Aggravating Factors  sitting for long time, worse at night (difficulty sleeping)   Pain Relieving Factors tylenol helps some, lying down for a minute and resting helps some     treatment: Issued the following to pt's HEP. Cues needed in session for posture and correct ex form/technique. Pt min guard to supervision with balance ex's, reports she will have someone with her at home.  Functional Quadriceps: Sit to Stand    Sit on edge of chair, feet flat on floor with  left foot forward and right foot back under you.  Stand up all the way and then slowly sit back down (keep the feet staggered). Use hands as needed. Repeat __10__ times per set. Do _1_ sets per session. Do _1-2_ sessions per day.  http://orth.exer.us/735   Copyright  VHI. All rights reserved.    Heel Raises    Stand with support. With knees straight, raise heels off ground. Hold _5__ seconds and then slowly lower heels back down.  Repeat __10_ times. Do _1-2_ times a day.  Copyright  VHI. All rights reserved.    AMBULATION: Side Step    Holding onto kitchen counter top for balance: Step sideways. Repeat in opposite direction. Make sure feet stay pointed at counter top, not turning outward or inward. Repeat for 3 laps toward each side.  Copyright  VHI. All rights reserved.   Feet Heel-Toe "Tandem"    Hold onto counter top with left hand and right arm at your side: walk a straight line forward by bringing one foot directly in front of the other foot and then walk a straight line backwards by bringing one foot directly behind the other one. Repeat for 3 laps each way. 1-2 times a day.    Copyright  VHI. All rights reserved.  "I love a Cytogeneticistarade" Lift    Holding onto kitchen counter  top with left hand for balance: perform high knee marching forward along counter top and then backwards along counter top.  Repeat 3 laps each way. Do _1-2__ sessions per day.  http://gt2.exer.us/345   Copyright  VHI. All rights reserved.   Walking on Heels    Holding onto kitchen counter top with left hand: Walk on heels forward along counter top and then walk on heels backwards along kitchen counter top. Keep your posture straight up, not bent forward!! Repeat for 3 laps each way. Do _1-2__ sessions per day.  Copyright  VHI. All rights reserved.          PT Education - 10/23/16 1140    Education provided Yes   Education Details HEP for LE stengthening and balance    Person(s) Educated Patient   Methods Explanation;Demonstration;Verbal cues;Handout   Comprehension Verbalized understanding;Returned demonstration;Verbal cues required;Need further instruction          PT Short Term Goals - 10/07/16 1141      PT SHORT TERM GOAL #1   Title pt/family to report compliance with HEP (Target Date for all STGs: 11/04/16)   Baseline no HEP   Status New     PT SHORT TERM GOAL #2   Title improve BERG balance score to > 37/56 for improved balance and decreased fall risk    Baseline 30/56   Status New     PT SHORT TERM GOAL #3   Title improve timed up and go to < 20 sec with LRAD for imroved mobility    Baseline 29.06 sec   Status New     PT SHORT TERM GOAL #4   Title improve gait velocity to > 2.0 ft/sec for improved mobility    Baseline 1.76 ft/sec   Status New     PT SHORT TERM GOAL #5   Title amb > 250' with LRAD with supervision on level indoor/paved outdoor surfaces for improved community access   Baseline 120' without device with minguard A           PT Long Term Goals - 10/07/16 1305      PT LONG TERM GOAL #1   Title verbalize understanding of CVA risk factors/warning signs (Target Date for all LTGs: 12/02/16)   Baseline no education   Status New     PT LONG TERM GOAL #2   Title improve BERG balance score to > 42/56 for improved balance and decreased fall risk    Baseline 30/56   Status New     PT LONG TERM GOAL #3   Title improve timed up and go to < 16 sec with LRAD for imroved mobility    Baseline 29.06 sec   Status New     PT LONG TERM GOAL #4   Title improve gait velocity to > 2.5 ft/sec for improved mobility    Baseline 1.76 ft/sec     PT LONG TERM GOAL #5   Title amb > 500' with LRAD with supervision on various indoor/outdoor surfaces for improved community access   Baseline 120' without device with minguard A           Plan - 10/23/16 1111    Clinical Impression Statement Today's skilled session focused on  establishment of HEP for LE strengthening and balance with no issues reported in session. Pt's right LE visably fatigued at end of session, reinforced use of cane at this time for safety/decreased fall risk due to LE weakness (pt arrived without cane again today). Pt verbalized  understanding. Pt should benefit from continued PT to progress toward unmet goals.                          Rehab Potential Good   Clinical Impairments Affecting Rehab Potential severity of deficits, decreased awareness/attention/cognition, insurance visit limitations   PT Frequency 1x / week   PT Duration 8 weeks   PT Treatment/Interventions ADLs/Self Care Home Management;Cryotherapy;Moist Heat;Electrical Stimulation;Cognitive remediation;Neuromuscular re-education;Balance training;Therapeutic exercise;Therapeutic activities;Functional mobility training;Stair training;Gait training;DME Instruction;Patient/family education;Vestibular   PT Next Visit Plan gait training (needs to use cane, forgot on eval), continue to work on right LE strengthening and balance   Consulted and Agree with Plan of Care Patient;Other (Comment)  no family present      Patient will benefit from skilled therapeutic intervention in order to improve the following deficits and impairments:  Abnormal gait, Decreased balance, Decreased coordination, Decreased mobility, Difficulty walking, Decreased strength, Decreased knowledge of use of DME, Impaired sensation, Decreased safety awareness, Decreased cognition  Visit Diagnosis: Hemiplegia and hemiparesis following cerebral infarction affecting right dominant side (HCC)  Muscle weakness (generalized)  Repeated falls     Problem List Patient Active Problem List   Diagnosis Date Noted  . Leg pain, right 09/10/2016  . Neck muscle strain 09/10/2016  . Healthcare maintenance 09/10/2016  . Neck pain on left side 06/19/2016  . Essential hypertension, benign 05/27/2016  . Medical non-compliance   .  Prediabetes   . RUE weakness   . Intracranial vascular stenosis   . Stroke (HCC) 05/16/2016  . Cerebrovascular accident (CVA) (HCC)   . Essential hypertension   . Obesity   . Abnormal EKG   . Hypertension 09/17/2012  . FATIGUE 01/06/2010  . CONSTIPATION 03/22/2009  . HELICOBACTER PYLORI INFECTION, HX OF 01/28/2009  . ALLERGIC RHINITIS 03/02/2008  . ABDOMINAL PAIN 03/02/2008  . Hyperlipemia 07/18/2007    Sallyanne Kuster, PTA, [facility] [address] [place], [place] [address] [phone] 10/23/16, 11:53 AM   Name: Kathleen Larson MRN: [id number] Date of Birth: 02/05/1966

## 2016-10-23 NOTE — Patient Instructions (Addendum)
Functional Quadriceps: Sit to Stand    Sit on edge of chair, feet flat on floor with left foot forward and right foot back under you.  Stand up all the way and then slowly sit back down (keep the feet staggered). Use hands as needed. Repeat __10__ times per set. Do _1_ sets per session. Do _1-2_ sessions per day.  http://orth.exer.us/735   Copyright  VHI. All rights reserved.    Heel Raises    Stand with support. With knees straight, raise heels off ground. Hold _5__ seconds and then slowly lower heels back down.  Repeat __10_ times. Do _1-2_ times a day.  Copyright  VHI. All rights reserved.    AMBULATION: Side Step    Holding onto kitchen counter top for balance: Step sideways. Repeat in opposite direction. Make sure feet stay pointed at counter top, not turning outward or inward. Repeat for 3 laps toward each side.  Copyright  VHI. All rights reserved.   Feet Heel-Toe "Tandem"    Hold onto counter top with left hand and right arm at your side: walk a straight line forward by bringing one foot directly in front of the other foot and then walk a straight line backwards by bringing one foot directly behind the other one. Repeat for 3 laps each way. 1-2 times a day.    Copyright  VHI. All rights reserved.  "I love a Cytogeneticistarade" Lift    Holding onto kitchen counter top with left hand for balance: perform high knee marching forward along counter top and then backwards along counter top.  Repeat 3 laps each way. Do _1-2__ sessions per day.  http://gt2.exer.us/345   Copyright  VHI. All rights reserved.   Walking on Heels    Holding onto kitchen counter top with left hand: Walk on heels forward along counter top and then walk on heels backwards along kitchen counter top. Keep your posture straight up, not bent forward!! Repeat for 3 laps each way. Do _1-2__ sessions per day.  Copyright  VHI. All rights reserved.

## 2016-10-23 NOTE — Patient Instructions (Signed)
More speech therapy, starting January/February, 2018: [facility] Speech Dept. (sliding scale) [phone]

## 2016-10-23 NOTE — Therapy (Signed)
[facility] [address] [place], [place], [address] Phone: [phone]   Fax:  [phone]  Speech Language Pathology Treatment  Patient Details  Name: Kathleen Larson MRN: [id number] Date of Birth: 08/14/1966 Referring Provider: Madison Hickman, MD  Encounter Date: 10/23/2016      End of Session - 10/23/16 1421    Visit Number 9   Number of Visits 9   Date for SLP Re-Evaluation 10/22/16   Authorization - Visit Number 8   Authorization - Number of Visits 8   SLP Start Time 3016   SLP Stop Time  1230   SLP Time Calculation (min) 42 min   Activity Tolerance Patient tolerated treatment well      Past Medical History:  Diagnosis Date  . Allergy   . GERD (gastroesophageal reflux disease)   . Hypertension     Past Surgical History:  Procedure Laterality Date  . TUBAL LIGATION      There were no vitals filed for this visit.      Subjective Assessment - 10/23/16 1157    Subjective "Its - - go-going b-better!"               ADULT SLP TREATMENT - 10/23/16 1158      General Information   Behavior/Cognition Alert;Cooperative;Pleasant mood     Treatment Provided   Treatment provided Cognitive-Linquistic     Pain Assessment   Pain Assessment 0-10   Pain Score 7    Pain Location right leg   Pain Descriptors / Indicators Constant;Aching   Pain Intervention(s) Monitored during session     Cognitive-Linquistic Treatment   Treatment focused on Aphasia;Apraxia   Skilled Treatment Facilitated slow rate and tapping with sentence reading. and sentence fill in (cloze sentences/obvious completions), with simultaneous production and mild-mod dysfluency initially and with mild dysfluency by end of task. This was certainly improved over this SLP's last session with pt. Spontaneously, pt sentence production was mostly fluent with occasional min A. Speech was again less labored, however mild dysfluencies  persisted. SLP cues included easy onset and gentle, rolling articulation. Pt does not wish to cont as self-pay (Medicaid pending), so pt agrees this is her last visit. SLP encouraged pt to try [facility], and provided pt with a packet of expressive tasks and instructions how to move up speech heirachy.     Assessment / Recommendations / Plan   Plan --  d/c today     Progression Toward Goals   Progression toward goals --  see goal update - d/c today          SLP Education - 10/23/16 1416    Education provided Yes   Education Details speech heirarchy for expressive language tasks, [facility] speech/hearing for ongoing ST   Methods Explanation;Handout   Comprehension Verbalized understanding            SLP Long Term Goals - 10/23/16 1424      SLP LONG TERM GOAL #1   Title Pt will complete mildly complex naming tasks with 80% accuracy and rare min A   Baseline 50% accuracy   Status Achieved     SLP LONG TERM GOAL #2   Title Pt will utilize compensations for aphasia over 10 minute simple conversation with less than 2 requests for clarificiatoin   Baseline Pt not using compensations for aphasia   Status Partially Met     SLP LONG TERM GOAL #3   Title Pt will utilize compensations for verbal apraxia  over 10 minute conversation to be 90% intelligible with rare min A   Baseline Pt 75% intelligible at sentence level   Status Partially Met          Plan - 10/23/16 1422    Clinical Impression Statement Pt continues to require ongoing cueing to utlize compensations for verbal apraxia/neurogenic stuttering, but less cueing than last session this ST saw pt. Pt was informed today of [facility] speech program for cont'd therapy on sliding scale, as well as provided a packet of information/tasks to continue with in order to practice her smooth, easy, relaxed, slower speech. ST will be d/c'd at this time due to financial reasons.   Treatment/Interventions Compensatory strategies;Functional  tasks;Patient/family education;Multimodal communcation approach;SLP instruction and feedback;Language facilitation;Compensatory techniques   Potential to Achieve Goals Fair   Potential Considerations Severity of impairments;Financial resources   Consulted and Agree with Plan of Care Patient      Patient will benefit from skilled therapeutic intervention in order to improve the following deficits and impairments:   Apraxia following nontraumatic intracerebral hemorrhage  Aphasia following unspecified cerebrovascular disease   SPEECH THERAPY DISCHARGE SUMMARY  Visits from Start of Care: 8  Current functional level related to goals / functional outcomes: Pt's expressive language/speech skills have improved to near-functional for simple conversation with pt using slowed speech rate, a heavy focus on smooth/easy/gentle onset of voicing, and continuous articulation. Pt would like to d/c due to financial concerns. She was provided with a packet of home activities, and information re: [facility] speech program.   Remaining deficits: Mod/severe neurogenic stuttering.   Education / Equipment: Compensations for neurogenic stuttering  Plan: Patient agrees to discharge.  Patient goals were partially met. Patient is being discharged due to financial reasons.  ?????       Problem List Patient Active Problem List   Diagnosis Date Noted  . Leg pain, right 09/10/2016  . Neck muscle strain 09/10/2016  . Healthcare maintenance 09/10/2016  . Neck pain on left side 06/19/2016  . Essential hypertension, benign 05/27/2016  . Medical non-compliance   . Prediabetes   . RUE weakness   . Intracranial vascular stenosis   . Stroke ([place]) 05/16/2016  . Cerebrovascular accident (CVA) ([place])   . Essential hypertension   . Obesity   . Abnormal EKG   . Hypertension 09/17/2012  . FATIGUE 01/06/2010  . CONSTIPATION 03/22/2009  . HELICOBACTER PYLORI INFECTION, HX OF 01/28/2009  . ALLERGIC RHINITIS 03/02/2008   . ABDOMINAL PAIN 03/02/2008  . Hyperlipemia 07/18/2007    [facility] ,MS, CCC-SLP  10/23/2016, 2:26 PM  [facility] [address] [place], [place], [address] Phone: [phone]   Fax:  [phone]   Name: Kathleen Larson MRN: [id number] Date of Birth: May 01, 1966

## 2016-10-29 ENCOUNTER — Ambulatory Visit: Payer: Medicaid Other | Admitting: *Deleted

## 2016-10-29 ENCOUNTER — Encounter: Payer: Self-pay | Admitting: *Deleted

## 2016-10-29 ENCOUNTER — Ambulatory Visit: Payer: Medicaid Other

## 2016-10-29 ENCOUNTER — Ambulatory Visit: Payer: Medicaid Other | Admitting: Speech Pathology

## 2016-10-29 VITALS — BP 138/88 | HR 91

## 2016-10-29 DIAGNOSIS — I69351 Hemiplegia and hemiparesis following cerebral infarction affecting right dominant side: Secondary | ICD-10-CM

## 2016-10-29 DIAGNOSIS — M6281 Muscle weakness (generalized): Secondary | ICD-10-CM

## 2016-10-29 DIAGNOSIS — R278 Other lack of coordination: Secondary | ICD-10-CM

## 2016-10-29 DIAGNOSIS — I69190 Apraxia following nontraumatic intracerebral hemorrhage: Secondary | ICD-10-CM

## 2016-10-29 NOTE — Therapy (Signed)
[facility] [address] [place], [place], [address] Phone: [phone]   Fax:  [phone]  Physical Therapy Treatment  Patient Details  Name: Kathleen Larson MRN: [id number] Date of Birth: 02/20/1966 Referring Provider: Renne Muscaaniel L Warden, MD  Encounter Date: 10/29/2016      PT End of Session - 10/29/16 1014    Visit Number 3   Number of Visits 9   Date for PT Re-Evaluation 12/02/16   Authorization Type Medicaid - 8 visits 10/22/16/12/26/16    Authorization - Visit Number 3   Authorization - Number of Visits 8   PT Start Time 0933   PT Stop Time 1013   PT Time Calculation (min) 40 min   Equipment Utilized During Treatment Gait belt   Activity Tolerance Patient tolerated treatment well   Behavior During Therapy [facility] for tasks assessed/performed      Past Medical History:  Diagnosis Date  . Allergy   . GERD (gastroesophageal reflux disease)   . Hypertension     Past Surgical History:  Procedure Laterality Date  . TUBAL LIGATION      Vitals:   10/29/16 0938 10/29/16 1009  BP: (!) 158/108 138/88  Pulse: 90 91        Subjective Assessment - 10/29/16 0938    Subjective Pt denied falls or changes since last visit.    Pertinent History HTN   Patient Stated Goals improve mobility, "get back in my heels"   Currently in Pain? Yes   Pain Score 8    Pain Location Leg   Pain Orientation Right   Pain Descriptors / Indicators Aching   Pain Type Chronic pain;Neuropathic pain   Pain Onset More than a month ago   Pain Frequency Constant   Aggravating Factors  worse at night   Pain Relieving Factors tylenol                         [facility] Adult PT Treatment/Exercise - 10/29/16 0940      Ambulation/Gait   Ambulation/Gait Yes   Ambulation/Gait Assistance 5: Supervision;4: Min guard   Ambulation/Gait Assistance Details Cues and demo for sequencing with SPC, improved heel strike, and upright  posture.   Ambulation Distance (Feet) 75 Feet  x2, 115', 230' and 10x7' in // bars   Assistive device Straight cane   Gait Pattern Decreased stance time - right;Decreased step length - left;Decreased weight shift to right;Decreased dorsiflexion - right;Trendelenburg;Poor foot clearance - right   Ambulation Surface Level;Indoor                PT Education - 10/29/16 1013    Education provided Yes   Education Details PT educated pt on importance of always using SPC and proper sequencing. PT discussed the importance of taking BP meds as prescribed 2/2 elevated BP, as pt reported she didn't take it this morning as it makes her use the bathroom.   Person(s) Educated Patient   Methods Explanation   Comprehension Verbalized understanding          PT Short Term Goals - 10/07/16 1141      PT SHORT TERM GOAL #1   Title pt/family to report compliance with HEP (Target Date for all STGs: 11/04/16)   Baseline no HEP   Status New     PT SHORT TERM GOAL #2   Title improve BERG balance score to > 37/56 for improved balance and decreased fall risk  Baseline 30/56   Status New     PT SHORT TERM GOAL #3   Title improve timed up and go to < 20 sec with LRAD for imroved mobility    Baseline 29.06 sec   Status New     PT SHORT TERM GOAL #4   Title improve gait velocity to > 2.0 ft/sec for improved mobility    Baseline 1.76 ft/sec   Status New     PT SHORT TERM GOAL #5   Title amb > 250' with LRAD with supervision on level indoor/paved outdoor surfaces for improved community access   Baseline 120' without device with minguard A           PT Long Term Goals - 10/07/16 1305      PT LONG TERM GOAL #1   Title verbalize understanding of CVA risk factors/warning signs (Target Date for all LTGs: 12/02/16)   Baseline no education   Status New     PT LONG TERM GOAL #2   Title improve BERG balance score to > 42/56 for improved balance and decreased fall risk    Baseline 30/56    Status New     PT LONG TERM GOAL #3   Title improve timed up and go to < 16 sec with LRAD for imroved mobility    Baseline 29.06 sec   Status New     PT LONG TERM GOAL #4   Title improve gait velocity to > 2.5 ft/sec for improved mobility    Baseline 1.76 ft/sec     PT LONG TERM GOAL #5   Title amb > 500' with LRAD with supervision on various indoor/outdoor surfaces for improved community access   Baseline 120' without device with minguard A               Plan - 10/29/16 1215    Clinical Impression Statement Pt demonstrated progress, as she was able to progress to amb. with S and minimal cues for sequencing with SPC. Pt required two seated rest breaks 2/2 fatigue. Pt's first BP reading was elevated, but BP cuff was at the end of the index for arm circumference, second BP reading was decr. with larger arm cuff. PT still retierated the importance of taking BP meds as prescribed. Continue with POC.    Rehab Potential Good   Clinical Impairments Affecting Rehab Potential severity of deficits, decreased awareness/attention/cognition, insurance visit limitations   PT Frequency 1x / week   PT Duration 8 weeks   PT Treatment/Interventions ADLs/Self Care Home Management;Cryotherapy;Moist Heat;Electrical Stimulation;Cognitive remediation;Neuromuscular re-education;Balance training;Therapeutic exercise;Therapeutic activities;Functional mobility training;Stair training;Gait training;DME Instruction;Patient/family education;Vestibular   PT Next Visit Plan check STGs, gait training with SPC, continue to work on right LE strengthening and balance   Consulted and Agree with Plan of Care Patient;Other (Comment)  no family present      Patient will benefit from skilled therapeutic intervention in order to improve the following deficits and impairments:  Abnormal gait, Decreased balance, Decreased coordination, Decreased mobility, Difficulty walking, Decreased strength, Decreased knowledge of use  of DME, Impaired sensation, Decreased safety awareness, Decreased cognition  Visit Diagnosis: Hemiplegia and hemiparesis following cerebral infarction affecting right dominant side (HCC)  Muscle weakness (generalized)     Problem List Patient Active Problem List   Diagnosis Date Noted  . Leg pain, right 09/10/2016  . Neck muscle strain 09/10/2016  . Healthcare maintenance 09/10/2016  . Neck pain on left side 06/19/2016  . Essential hypertension, benign 05/27/2016  . Medical non-compliance   .  Prediabetes   . RUE weakness   . Intracranial vascular stenosis   . Stroke (HCC) 05/16/2016  . Cerebrovascular accident (CVA) (HCC)   . Essential hypertension   . Obesity   . Abnormal EKG   . Hypertension 09/17/2012  . FATIGUE 01/06/2010  . CONSTIPATION 03/22/2009  . HELICOBACTER PYLORI INFECTION, HX OF 01/28/2009  . ALLERGIC RHINITIS 03/02/2008  . ABDOMINAL PAIN 03/02/2008  . Hyperlipemia 07/18/2007    Eun Vermeer L 10/29/2016, 12:17 PM  [facility] [address] [place], [place], [address] Phone: [phone]   Fax:  [phone]  Name: Kathleen Larson MRN: [id number] Date of Birth: 10/20/1966  Zerita BoersJennifer Niam Nepomuceno, PT,DPT 10/29/16 12:18 PM Phone: [phone] Fax: [phone]

## 2016-10-29 NOTE — Therapy (Signed)
[facility] [address] [place], [place], [address] Phone: [phone]   Fax:  [phone]  Occupational Therapy Treatment  Patient Details  Name: Kathleen Larson MRN: [id number] Date of Birth: 06/21/1966 Referring Provider: Dr. Reuel Boom L. Myrtie Soman  Encounter Date: 10/29/2016      OT End of Session - 10/29/16 1224    Visit Number 3   Number of Visits 9   Date for OT Re-Evaluation 12/15/16   Authorization Type MCD approved 8 visits from 10/21/16 - 12/15/16   Authorization Time Period 2   Authorization - Visit Number 8   OT Start Time 1017   OT Stop Time 1100   OT Time Calculation (min) 43 min   Activity Tolerance Patient tolerated treatment well   Behavior During Therapy WFL for tasks assessed/performed      Past Medical History:  Diagnosis Date  . Allergy   . GERD (gastroesophageal reflux disease)   . Hypertension     Past Surgical History:  Procedure Laterality Date  . TUBAL LIGATION      There were no vitals filed for this visit.      Subjective Assessment - 10/29/16 1020    Subjective  Pt reportst hat she didn't take her BP medication this morning since she has 3 out-pt appointments and it makes her have to use the rest room. PT assessed her BP after therapy and communicated to OT that it was "OK' Will monitor during session.   Pertinent History See EPIC; HTN; Falls ("~4x since stroke"); Aphasia   Patient Stated Goals "Get stronger, get back to normal"   Currently in Pain? Yes   Pain Score 6    Pain Location Leg   Pain Orientation Right   Pain Descriptors / Indicators Aching   Pain Type Chronic pain;Neuropathic pain   Pain Onset More than a month ago   Pain Frequency Constant   Aggravating Factors  Worse at night   Pain Relieving Factors tylenol   Multiple Pain Sites No                      OT Treatments/Exercises (OP) - 10/29/16 0001      Cognitive Exercises   Other  Cognitive Exercises 1 Memory compensation strategies - issued and reviewed with pt in clinic today.     Fine Motor Coordination/Neuromuscular rehabilitation   Other Fine Motor Exercises Reviewed and performed FM/coordination ex's. Pt overall Mod difficulty w/ R hand. Req vc's for positioning and follow through of ex's. Pt with motor apraxia and required cues to help with this.   Other Fine Motor Exercises Puty ex's using red putty - Mod difficulty noted - handouts printed and re-issued.                OT Education - 10/29/16 1223    Education provided Yes   Education Details Reviewed Home program. Upgraded to add Memory compensation strategies.    Person(s) Educated Patient   Methods Explanation;Demonstration;Verbal cues;Handout   Comprehension Verbalized understanding;Returned demonstration;Verbal cues required          OT Short Term Goals - 10/22/16 1154      OT SHORT TERM GOAL #1   Title Pt will be Mod I signs and symptoms of CVA    Baseline Dependent   Time 4   Period Weeks   Status On-going     OT SHORT TERM GOAL #2   Title Pt will be Mod I HEP for  RUE FM/coordination   Baseline Dependent   Time 4   Period Weeks   Status On-going     OT SHORT TERM GOAL #3   Title Pt will be Mod I HEP for RUE proximal strengthening and grip as seen by improved grip strength by 5# or more R hand   Baseline Dependent/See Eval 10/07/16   Time 4   Period Weeks   Status New     OT SHORT TERM GOAL #4   Title Pt will state 2-3 memory strategies given Min A/Vc's   Baseline 10/07/16 Unable   Time 4   Period Weeks   Status New           OT Long Term Goals - 10/07/16 1147      OT LONG TERM GOAL #1   Title Pt will demonstrate improved coordination as seen by improved 9 hole peg score by 5 seconds or more RUE   Baseline See Eval 10/07/16   Time 8   Period Weeks   Status New     OT LONG TERM GOAL #2   Title Pt will be Mod I LB ADL's (tying shoes and fastening  buttons/snaps).   Baseline Supervision-Min A 10/07/16   Time 8   Period Weeks   Status New     OT LONG TERM GOAL #3   Title Pt will be Mod I simple snack or meal prep while maintaining safety and balance in ADL kitchen   Baseline 10/07/16 Family preparing meals   Time 8   Period Weeks   Status New     OT LONG TERM GOAL #4   Title Pt will independently state 2-3 memory strategies & implement during ADL's   Baseline 10/07/16 Supervision-min A   Time 8   Period Weeks   Status New     OT LONG TERM GOAL #5   Title Pt will demonstrate improved RUE strength as seen by MMT of 4/5 throughout   Baseline 10/07/16 3-/5 RUE   Time 8   Period Weeks   Status New               Plan - 10/29/16 1224    Clinical Impression Statement Pt cont to demonstrate apraxia during coordination tasks. Reviewed home program and added memory strategies.   Rehab Potential Fair   Clinical Impairments Affecting Rehab Potential Decreaed awareness of deficits; cognition   OT Frequency 2x / week   OT Duration 8 weeks   OT Treatment/Interventions Self-care/ADL training;DME and/or AE instruction;Splinting;Patient/family education;Balance training;Therapeutic exercises;Therapeutic exercise;Therapeutic activities;Cognitive remediation/compensation;Passive range of motion;Neuromuscular education;Functional Mobility Training;Energy conservation;Manual Therapy;Visual/perceptual remediation/compensation;Electrical Stimulation   Plan Practice tying shoes, functional use of RUE, review memory strategies, grip/strength.   Consulted and Agree with Plan of Care Patient      Patient will benefit from skilled therapeutic intervention in order to improve the following deficits and impairments:  Decreased coordination, Decreased range of motion, Decreased endurance, Decreased safety awareness, Impaired sensation, Decreased knowledge of precautions, Decreased activity tolerance, Decreased balance, Decreased knowledge of use  of DME, Impaired UE functional use, Pain, Impaired vision/preception, Impaired perceived functional ability, Decreased strength, Decreased cognition, Decreased mobility  Visit Diagnosis: Muscle weakness (generalized)  Hemiplegia and hemiparesis following cerebral infarction affecting right dominant side (HCC)  Apraxia following nontraumatic intracerebral hemorrhage  Other lack of coordination    Problem List Patient Active Problem List   Diagnosis Date Noted  . Leg pain, right 09/10/2016  . Neck muscle strain 09/10/2016  . Healthcare maintenance 09/10/2016  .  Neck pain on left side 06/19/2016  . Essential hypertension, benign 05/27/2016  . Medical non-compliance   . Prediabetes   . RUE weakness   . Intracranial vascular stenosis   . Stroke (HCC) 05/16/2016  . Cerebrovascular accident (CVA) (HCC)   . Essential hypertension   . Obesity   . Abnormal EKG   . Hypertension 09/17/2012  . FATIGUE 01/06/2010  . CONSTIPATION 03/22/2009  . HELICOBACTER PYLORI INFECTION, HX OF 01/28/2009  . ALLERGIC RHINITIS 03/02/2008  . ABDOMINAL PAIN 03/02/2008  . Hyperlipemia 07/18/2007    Alm BustardBarnhill, Amy Beth Dixon, OTR/L 10/29/2016, 12:36 PM  [facility] [address] [place], [place], [address] Phone: [phone]   Fax:  [phone]  Name: Kathleen Larson MRN: [id number] Date of Birth: [birth year]

## 2016-10-29 NOTE — Patient Instructions (Addendum)
Memory Compensation Strategies  1. Use "WARM" strategy.  W= write it down  A= associate it  R= repeat it  M= make a mental note  2.   You can keep a Glass blower/designerMemory Notebook.  Use a 3-ring notebook with sections for the following: calendar, important names and phone numbers,  medications, doctors' names/phone numbers, lists/reminders, and a section to journal what you did  each day.   3.    Use a calendar to write appointments down.  4.    Write yourself a schedule for the day.  This can be placed on the calendar or in a separate section of the Memory Notebook.  Keeping a regular schedule can help memory.  5.    Use medication organizer with sections for each day or morning/evening pills.  You may need help loading it  6.    Keep a basket, or pegboard by the door.  Place items that you need to take out with you in the basket or on the pegboard.  You may also want to include a message board for reminders.  7.    Use sticky notes.  Place sticky notes with reminders in a place where the task is performed.  For example: " turn off the stove" placed by the stove, "lock the door" placed on the door at eye level, " take your medications" on  the bathroom mirror or by the place where you normally take your medications.  8.    Use alarms/timers.  Use while cooking to remind yourself to check on food or as a reminder to take your medicine, or as a reminder to make a call, or as a reminder to perform another task, etc.  Flexion (Resistive Putty)    Keeping fingertips straight, press putty towards base of palm. Repeat __10__ times. Do __1-2__ sessions per day.  Lateral Pinch Strengthening (Resistive Putty)    Squeeze between thumb and side of each finger in turn. Repeat __10__ times. Do _1-2___ sessions per day.  Pinch: Three Jaw Chuck    Pull using left thumb, index and middle fingers. Repeat __10__ times. Do _1-2__ sessions per day. Activity: Use tongs for picking up. Spin or wind up  toys.*  Copyright  VHI. All rights reserved.

## 2016-11-11 ENCOUNTER — Other Ambulatory Visit: Payer: Self-pay | Admitting: Family Medicine

## 2016-11-11 DIAGNOSIS — Z1231 Encounter for screening mammogram for malignant neoplasm of breast: Secondary | ICD-10-CM

## 2016-11-13 ENCOUNTER — Ambulatory Visit: Payer: Medicaid Other | Admitting: Occupational Therapy

## 2016-11-13 ENCOUNTER — Ambulatory Visit: Payer: Medicaid Other | Attending: Family Medicine | Admitting: Rehabilitation

## 2016-11-13 ENCOUNTER — Encounter: Payer: Self-pay | Admitting: Rehabilitation

## 2016-11-13 DIAGNOSIS — I69190 Apraxia following nontraumatic intracerebral hemorrhage: Secondary | ICD-10-CM | POA: Insufficient documentation

## 2016-11-13 DIAGNOSIS — R278 Other lack of coordination: Secondary | ICD-10-CM | POA: Insufficient documentation

## 2016-11-13 DIAGNOSIS — M6281 Muscle weakness (generalized): Secondary | ICD-10-CM | POA: Diagnosis not present

## 2016-11-13 DIAGNOSIS — R2689 Other abnormalities of gait and mobility: Secondary | ICD-10-CM | POA: Diagnosis present

## 2016-11-13 DIAGNOSIS — I69351 Hemiplegia and hemiparesis following cerebral infarction affecting right dominant side: Secondary | ICD-10-CM | POA: Insufficient documentation

## 2016-11-13 NOTE — Patient Instructions (Signed)
Functional Quadriceps: Sit to Stand    Sit on edge of chair, feet flat on floor with left foot forward and right foot back under you.  Stand up all the way and then slowly sit back down (keep the feet staggered). Use hands as needed. Repeat __10__ times per set. Do _1_ sets per session. Do _1-2_ sessions per day.  http://orth.exer.us/735   Copyright  VHI. All rights reserved.    Heel Raises    Stand with support. With knees straight, raise heels off ground. Hold _5__ seconds and then slowly lower heels back down.  Repeat __10_ times. Do _1-2_ times a day.  Copyright  VHI. All rights reserved.    AMBULATION: Side Step    Holding onto kitchen counter top for balance: Step sideways. Repeat in opposite direction. Make sure feet stay pointed at counter top, not turning outward or inward. Repeat for 3 laps toward each side.  Copyright  VHI. All rights reserved.   Feet Heel-Toe "Tandem"    Hold onto counter top with left hand and right arm at your side: walk a straight line forward by bringing one foot directly in front of the other foot and then walk a straight line backwards by bringing one foot directly behind the other one. Repeat for 3 laps each way. 1-2 times a day.    Copyright  VHI. All rights reserved.  "I love a Cytogeneticistarade" Lift    Holding onto kitchen counter top with left hand for balance: perform high knee marching forward along counter top and then backwards along counter top.  Repeat 3 laps each way. Do _1-2__ sessions per day.  http://gt2.exer.us/345   Copyright  VHI. All rights reserved.   Walking on Heels    Holding onto kitchen counter top with left hand: Walk on heels forward along counter top and then walk on heels backwards along kitchen counter top. Keep your posture straight up, not bent forward!! Repeat for 3 laps each way. Do _1-2__ sessions per day.

## 2016-11-13 NOTE — Therapy (Signed)
[facility] [address] [place], [place], [address] Phone: [phone]   Fax:  [phone]  Occupational Therapy Treatment  Patient Details  Name: Kathleen Larson MRN: [id number] Date of Birth: 03-25-1966 Referring Provider: Dr. Reuel Boom L. Myrtie Soman  Encounter Date: 11/13/2016      OT End of Session - 11/13/16 1420    Visit Number 4   Number of Visits 9   Date for OT Re-Evaluation 12/15/16   Authorization Type MCD approved 8 visits from 10/21/16 - 12/15/16   Authorization - Visit Number 3   Authorization - Number of Visits 8   OT Start Time 1402   OT Stop Time 1445   OT Time Calculation (min) 43 min   Activity Tolerance Patient tolerated treatment well   Behavior During Therapy [facility] for tasks assessed/performed      Past Medical History:  Diagnosis Date  . Allergy   . GERD (gastroesophageal reflux disease)   . Hypertension     Past Surgical History:  Procedure Laterality Date  . TUBAL LIGATION      There were no vitals filed for this visit.      Subjective Assessment - 11/13/16 1426    Subjective  Pt reports hip pain at night   Pertinent History See EPIC; HTN; Falls ("~4x since stroke"); Aphasia   Patient Stated Goals "Get stronger, get back to normal"   Currently in Pain? Yes   Pain Score 4    Pain Location Hip   Pain Orientation Right   Pain Descriptors / Indicators Aching   Pain Type Chronic pain   Pain Onset More than a month ago   Pain Frequency Constant   Aggravating Factors  worse at night   Pain Relieving Factors tylenol   Multiple Pain Sites No                Copying small peg design at mid-high range in seated for fine motor coordination task with cognitive component, pt required mod v.c. For correct design, min difficulty for fine motor coordination. Removing pegs with in hand manipulation min difficulty/ drops Pt practiced tying shoes with foot propped on footstool, pt  performed indeendently              OT Education - 11/13/16 1422    Education provided Yes   Education Details Reviewed cane exercises for shoulder flexion and abduction in seated, red putty HEP   Person(s) Educated Patient   Methods Explanation;Demonstration;Verbal cues   Comprehension Verbalized understanding;Returned demonstration;Verbal cues required  min v.c          OT Short Term Goals - 10/22/16 1154      OT SHORT TERM GOAL #1   Title Pt will be Mod I signs and symptoms of CVA    Baseline Dependent   Time 4   Period Weeks   Status On-going     OT SHORT TERM GOAL #2   Title Pt will be Mod I HEP for RUE FM/coordination   Baseline Dependent   Time 4   Period Weeks   Status On-going     OT SHORT TERM GOAL #3   Title Pt will be Mod I HEP for RUE proximal strengthening and grip as seen by improved grip strength by 5# or more R hand   Baseline Dependent/See Eval 10/07/16   Time 4   Period Weeks   Status New     OT SHORT TERM GOAL #4   Title Pt will  state 2-3 memory strategies given Min A/Vc's   Baseline 10/07/16 Unable   Time 4   Period Weeks   Status New           OT Long Term Goals - 10/07/16 1147      OT LONG TERM GOAL #1   Title Pt will demonstrate improved coordination as seen by improved 9 hole peg score by 5 seconds or more RUE   Baseline See Eval 10/07/16   Time 8   Period Weeks   Status New     OT LONG TERM GOAL #2   Title Pt will be Mod I LB ADL's (tying shoes and fastening buttons/snaps).   Baseline Supervision-Min A 10/07/16   Time 8   Period Weeks   Status New     OT LONG TERM GOAL #3   Title Pt will be Mod I simple snack or meal prep while maintaining safety and balance in ADL kitchen   Baseline 10/07/16 Family preparing meals   Time 8   Period Weeks   Status New     OT LONG TERM GOAL #4   Title Pt will independently state 2-3 memory strategies & implement during ADL's   Baseline 10/07/16 Supervision-min A   Time 8    Period Weeks   Status New     OT LONG TERM GOAL #5   Title Pt will demonstrate improved RUE strength as seen by MMT of 4/5 throughout   Baseline 10/07/16 3-/5 RUE   Time 8   Period Weeks   Status New               Plan - 11/13/16 1419    Clinical Impression Statement Pt is progressing towards goals for fine motor coordination and RUE functional use   Rehab Potential Fair   Clinical Impairments Affecting Rehab Potential Decreaed awareness of deficits; cognition   OT Frequency 2x / week   OT Duration 8 weeks   OT Treatment/Interventions Self-care/ADL training;DME and/or AE instruction;Splinting;Patient/family education;Balance training;Therapeutic exercises;Therapeutic exercise;Therapeutic activities;Cognitive remediation/compensation;Passive range of motion;Neuromuscular education;Functional Mobility Training;Energy conservation;Manual Therapy;Visual/perceptual remediation/compensation;Electrical Stimulation   Plan check short term goals, review memory strategies, grip/ strength   Consulted and Agree with Plan of Care Patient      Patient will benefit from skilled therapeutic intervention in order to improve the following deficits and impairments:  Decreased coordination, Decreased range of motion, Decreased endurance, Decreased safety awareness, Impaired sensation, Decreased knowledge of precautions, Decreased activity tolerance, Decreased balance, Decreased knowledge of use of DME, Impaired UE functional use, Pain, Impaired vision/preception, Impaired perceived functional ability, Decreased strength, Decreased cognition, Decreased mobility  Visit Diagnosis: Muscle weakness (generalized)  Hemiplegia and hemiparesis following cerebral infarction affecting right dominant side (HCC)  Apraxia following nontraumatic intracerebral hemorrhage  Other lack of coordination    Problem List Patient Active Problem List   Diagnosis Date Noted  . Leg pain, right 09/10/2016  . Neck  muscle strain 09/10/2016  . Healthcare maintenance 09/10/2016  . Neck pain on left side 06/19/2016  . Essential hypertension, benign 05/27/2016  . Medical non-compliance   . Prediabetes   . RUE weakness   . Intracranial vascular stenosis   . Stroke (HCC) 05/16/2016  . Cerebrovascular accident (CVA) (HCC)   . Essential hypertension   . Obesity   . Abnormal EKG   . Hypertension 09/17/2012  . FATIGUE 01/06/2010  . CONSTIPATION 03/22/2009  . HELICOBACTER PYLORI INFECTION, HX OF 01/28/2009  . ALLERGIC RHINITIS 03/02/2008  . ABDOMINAL PAIN 03/02/2008  .  Hyperlipemia 07/18/2007    Nakeisha Greenhouse 11/13/2016, 4:00 PM Keene BreathKathryn Nozomi Mettler, OTR/L [phone] Phone: [phone] 4:00 PM 11/13/16 [facility] [address] [place], [place], [address] Phone: [phone]   Fax:  [phone]  Name: Treasa SchoolConstance D Chiusano MRN: [id number] Date of Birth: 1966/01/11

## 2016-11-13 NOTE — Therapy (Signed)
[facility] [address] [place], [place], [address] Phone: [phone]   Fax:  [phone]  Physical Therapy Treatment  Patient Details  Name: Kathleen Larson MRN: [id number] Date of Birth: 04/27/1966 Referring Provider: Eloise Levels, MD  Encounter Date: 11/13/2016      PT End of Session - 11/13/16 1649    Visit Number 4   Number of Visits 9   Date for PT Re-Evaluation 12/02/16   Authorization Type Medicaid - 8 visits 10/22/16/12/26/16    Authorization - Visit Number 4   Authorization - Number of Visits 8   PT Start Time 1660   PT Stop Time 1400   PT Time Calculation (min) 44 min   Equipment Utilized During Treatment Gait belt   Activity Tolerance Patient tolerated treatment well   Behavior During Therapy [facility] for tasks assessed/performed      Past Medical History:  Diagnosis Date  . Allergy   . GERD (gastroesophageal reflux disease)   . Hypertension     Past Surgical History:  Procedure Laterality Date  . TUBAL LIGATION      There were no vitals filed for this visit.      Subjective Assessment - 11/13/16 1319    Subjective Reports no falls, but does have increased R leg pain today   Pertinent History HTN   Patient Stated Goals improve mobility, "get back in my heels"   Currently in Pain? Yes   Pain Score 6    Pain Location Leg   Pain Orientation Right   Pain Descriptors / Indicators Aching   Pain Type Acute pain;Chronic pain   Pain Radiating Towards from hip down   Pain Onset More than a month ago   Pain Frequency Constant   Aggravating Factors  worse at night   Pain Relieving Factors tylenol            NMR: Went through HEP, see pt instruction for details on exercises and reps performed.              [facility] PT Treatment/Exercise - 11/13/16 1340      Transfers   Transfers Sit to Stand;Stand to Sit   Comments Performed as part of HEP with focus/emphasis on  midline posture when performing task as she tends to bias trunk and WB to the L.  Provided mirror for improved feedback as well as visual target for increased carryover.  Performed x 10 reps.       Ambulation/Gait   Ambulation/Gait Yes   Ambulation/Gait Assistance 6: Modified independent (Device/Increase time)   Ambulation Distance (Feet) 300 Feet   Assistive device Straight cane   Gait Pattern Decreased stance time - right;Decreased step length - left;Decreased weight shift to right;Decreased dorsiflexion - right;Trendelenburg;Poor foot clearance - right   Ambulation Surface Level;Indoor  note that she ambulated into clinic w/ SPC   Gait velocity 1.46 ft/sec     Standardized Balance Assessment   Standardized Balance Assessment Berg Balance Test;Timed Up and Go Test     Berg Balance Test   Sit to Stand Able to stand  independently using hands   Standing Unsupported Able to stand safely 2 minutes   Sitting with Back Unsupported but Feet Supported on Floor or Stool Able to sit safely and securely 2 minutes   Stand to Sit Sits safely with minimal use of hands   Transfers Able to transfer safely, minor use of hands   Standing Unsupported with Eyes Closed Able  to stand 10 seconds safely   Standing Ubsupported with Feet Together Able to place feet together independently and stand for 1 minute with supervision   From Standing, Reach Forward with Outstretched Arm Can reach confidently >25 cm (10")   From Standing Position, Pick up Object from [place] to pick up shoe, needs supervision   From Standing Position, Turn to Look Behind Over each Shoulder Looks behind one side only/other side shows less weight shift   Turn 360 Degrees Able to turn 360 degrees safely but slowly   Standing Unsupported, Alternately Place Feet on Step/Stool Able to complete >2 steps/needs minimal assist   Standing Unsupported, One Foot in [place] to take small step independently and hold 30 seconds   Standing on One  Leg Able to lift leg independently and hold > 10 seconds  on LLE   Total Score 45     Timed Up and Go Test   TUG Normal TUG   Normal TUG (seconds) 20.72                PT Education - 11/13/16 1321    Education provided Yes   Education Details importance of compliance with HEP   Person(s) Educated Patient   Methods Explanation   Comprehension Verbalized understanding          PT Short Term Goals - 11/13/16 1322      PT SHORT TERM GOAL #1   Title pt/family to report compliance with HEP (Target Date for all STGs: 11/04/16)   Baseline needed max cues to complete HEP   Status Not Met     PT SHORT TERM GOAL #2   Title improve BERG balance score to > 37/56 for improved balance and decreased fall risk    Baseline 45/56 on 11/13/16   Status Achieved     PT SHORT TERM GOAL #3   Title improve timed up and go to < 20 sec with LRAD for imroved mobility    Baseline 29.06 sec   Time 20.72  on 11/13/16   Status Partially Met     PT SHORT TERM GOAL #4   Title improve gait velocity to > 2.0 ft/sec for improved mobility    Baseline 1.46 ft/sec, down from 1.76 ft/sec at eval   Time --  with [facility] 11/13/16 (22.47 sec)   Status Not Met     PT SHORT TERM GOAL #5   Title amb > 250' with LRAD with supervision on level indoor/paved outdoor surfaces for improved community access   Baseline met-Mod I with SPC   Status Achieved           PT Long Term Goals - 11/13/16 1410      PT LONG TERM GOAL #1   Title verbalize understanding of CVA risk factors/warning signs (Target Date for all LTGs: 12/02/16)   Baseline no education   Status On-going     PT LONG TERM GOAL #2   Title improve BERG balance score to >/=52/56 for improved balance and decreased fall risk    Baseline updated 11/13/16 due to progress   Status Revised     PT LONG TERM GOAL #3   Title improve timed up and go to < 16 sec with LRAD for imroved mobility    Baseline 29.06 sec   Status On-going     PT LONG TERM GOAL  #4   Title improve gait velocity to > 2.5 ft/sec for improved mobility    Baseline 1.76 ft/sec  Status On-going     PT LONG TERM GOAL #5   Title amb > 500' without AD at mod I level on various indoor surfaces for improved household ambulation.    Baseline updated 11/13/16 due to progress   Status Revised     Additional Long Term Goals   Additional Long Term Goals Yes     PT LONG TERM GOAL #6   Title Pt will ambulate 1000' with LRAD at mod I level in order to indicate safe return to community.    Status New  added 11/13/16               Plan - 11/13/16 1651    Clinical Impression Statement Skilled session focused on assessment of STGs.  She has met 2/5 STGs partially meeting a 5th goal.  She has been non-compliant with HEP, but still demonstrates progress with BERG and TUG scores.  Possible tone in RLE causing decreased gait speed.     Rehab Potential Good   Clinical Impairments Affecting Rehab Potential severity of deficits, decreased awareness/attention/cognition, insurance visit limitations   PT Frequency 1x / week   PT Duration 8 weeks   PT Treatment/Interventions ADLs/Self Care Home Management;Cryotherapy;Moist Heat;Electrical Stimulation;Cognitive remediation;Neuromuscular re-education;Balance training;Therapeutic exercise;Therapeutic activities;Functional mobility training;Stair training;Gait training;DME Instruction;Patient/family education;Vestibular   PT Next Visit Plan gait training with SPC, continue to work on right LE strengthening and balance, check compliance with HEP   Consulted and Agree with Plan of Care Patient;Other (Comment)  no family present      Patient will benefit from skilled therapeutic intervention in order to improve the following deficits and impairments:  Abnormal gait, Decreased balance, Decreased coordination, Decreased mobility, Difficulty walking, Decreased strength, Decreased knowledge of use of DME, Impaired sensation, Decreased safety  awareness, Decreased cognition  Visit Diagnosis: Muscle weakness (generalized)  Hemiplegia and hemiparesis following cerebral infarction affecting right dominant side [place] [facility])     Problem List Patient Active Problem List   Diagnosis Date Noted  . Leg pain, right 09/10/2016  . Neck muscle strain 09/10/2016  . Healthcare maintenance 09/10/2016  . Neck pain on left side 06/19/2016  . Essential hypertension, benign 05/27/2016  . Medical non-compliance   . Prediabetes   . RUE weakness   . Intracranial vascular stenosis   . Stroke ([place]) 05/16/2016  . Cerebrovascular accident (CVA) ([place])   . Essential hypertension   . Obesity   . Abnormal EKG   . Hypertension 09/17/2012  . FATIGUE 01/06/2010  . CONSTIPATION 03/22/2009  . HELICOBACTER PYLORI INFECTION, HX OF 01/28/2009  . ALLERGIC RHINITIS 03/02/2008  . ABDOMINAL PAIN 03/02/2008  . Hyperlipemia 07/18/2007    Cameron Sprang, PT, MPT [facility] [address] [place], [place], [address] Phone: [phone]   Fax:  [phone] 11/13/16, 5:02 PM  Name: Kathleen Larson MRN: [id number] Date of Birth: 01/06/1966

## 2016-11-20 ENCOUNTER — Encounter: Payer: Self-pay | Admitting: Family Medicine

## 2016-11-20 ENCOUNTER — Ambulatory Visit: Payer: Medicaid Other | Admitting: Occupational Therapy

## 2016-11-20 ENCOUNTER — Ambulatory Visit: Payer: Medicaid Other | Admitting: Physical Therapy

## 2016-11-20 ENCOUNTER — Ambulatory Visit (INDEPENDENT_AMBULATORY_CARE_PROVIDER_SITE_OTHER): Payer: Medicaid Other | Admitting: Family Medicine

## 2016-11-20 ENCOUNTER — Other Ambulatory Visit (HOSPITAL_COMMUNITY)
Admission: RE | Admit: 2016-11-20 | Discharge: 2016-11-20 | Disposition: A | Discharge status: Home / Self Care | Payer: Medicaid Other | Source: Ambulatory Visit | Attending: Family Medicine | Admitting: Family Medicine

## 2016-11-20 VITALS — BP 128/89

## 2016-11-20 VITALS — BP 104/70 | HR 90 | Temp 97.9°F | Ht 65.0 in | Wt 230.2 lb

## 2016-11-20 DIAGNOSIS — I69190 Apraxia following nontraumatic intracerebral hemorrhage: Secondary | ICD-10-CM

## 2016-11-20 DIAGNOSIS — M6281 Muscle weakness (generalized): Secondary | ICD-10-CM

## 2016-11-20 DIAGNOSIS — I10 Essential (primary) hypertension: Secondary | ICD-10-CM

## 2016-11-20 DIAGNOSIS — I69351 Hemiplegia and hemiparesis following cerebral infarction affecting right dominant side: Secondary | ICD-10-CM

## 2016-11-20 DIAGNOSIS — Z124 Encounter for screening for malignant neoplasm of cervix: Secondary | ICD-10-CM | POA: Diagnosis present

## 2016-11-20 DIAGNOSIS — R278 Other lack of coordination: Secondary | ICD-10-CM

## 2016-11-20 DIAGNOSIS — Z113 Encounter for screening for infections with a predominantly sexual mode of transmission: Secondary | ICD-10-CM | POA: Insufficient documentation

## 2016-11-20 DIAGNOSIS — M79651 Pain in right thigh: Secondary | ICD-10-CM | POA: Diagnosis not present

## 2016-11-20 DIAGNOSIS — Z1151 Encounter for screening for human papillomavirus (HPV): Secondary | ICD-10-CM | POA: Insufficient documentation

## 2016-11-20 DIAGNOSIS — R2689 Other abnormalities of gait and mobility: Secondary | ICD-10-CM

## 2016-11-20 DIAGNOSIS — Z01411 Encounter for gynecological examination (general) (routine) with abnormal findings: Secondary | ICD-10-CM | POA: Insufficient documentation

## 2016-11-20 NOTE — Patient Instructions (Signed)

## 2016-11-20 NOTE — Therapy (Signed)
[facility] [address] [place], [place], [address] Phone: [phone]   Fax:  [phone]  Physical Therapy Treatment  Patient Details  Name: Kathleen Larson MRN: [id number] Date of Birth: 12/30/1965 Referring Provider: Eloise Levels, MD  Encounter Date: 11/20/2016      PT End of Session - 11/20/16 1619    Visit Number 5   Number of Visits 9   Date for PT Re-Evaluation 12/02/16   Authorization Type Medicaid - 8 visits 10/22/16/12/26/16    PT Start Time 1102   PT Stop Time 1143   PT Time Calculation (min) 41 min   Equipment Utilized During Treatment Gait belt   Activity Tolerance Patient limited by fatigue   Behavior During Therapy [facility] for tasks assessed/performed      Past Medical History:  Diagnosis Date  . Allergy   . GERD (gastroesophageal reflux disease)   . Hypertension     Past Surgical History:  Procedure Laterality Date  . TUBAL LIGATION      Vitals:   11/20/16 1110  BP: 128/89        Subjective Assessment - 11/20/16 1558    Subjective Reports no falls. Reports she is doing HEP, however not every exercise, every day. Reports her sister goes walking with her outdoors (weather permitting).   Pertinent History HTN   Patient Stated Goals improve mobility, "get back in my heels"   Currently in Pain? Yes   Pain Location Hip   Pain Orientation Right   Pain Descriptors / Indicators Aching   Pain Type Neuropathic pain   Pain Radiating Towards hip, thigh   Pain Onset More than a month ago   Pain Frequency Constant   Aggravating Factors  while sleeping   Pain Relieving Factors medicine, sometimes change of position (Rt sidelying makes it worse; educated to use pillow between knees in lt sidelying)   Multiple Pain Sites No                         [facility] Adult PT Treatment/Exercise - 11/20/16 1603      Transfers   Transfers Sit to Stand;Stand to Sit   Sit to Stand 4: Min  guard;Without upper extremity assist;4: Min assist   Stand to Sit 5: Supervision;Without upper extremity assist   Number of Reps 10 reps   Comments Patient can functionally perform at modified independent level with use of LUE support; without hands, noted instability as rising to stand 50% of trials with occasional min assist to recover     Ambulation/Gait   Ambulation/Gait Assistance 6: Modified independent (Device/Increase time)   Ambulation/Gait Assistance Details pt sequencing appropriately with [facility]; states she is using it full-time   Ambulation Distance (Feet) 200 Feet  rest, 100   Assistive device Straight cane   Gait Pattern Step-through pattern;Decreased step length - left;Decreased stance time - right;Decreased dorsiflexion - right;Right genu recurvatum   Ambulation Surface Level;Indoor   Stairs Yes   Stairs Assistance 5: Supervision   Stairs Assistance Details (indicate cue type and reason) cues for sequencing and safe use of step-to pattern with descending   Stair Management Technique Two rails;Alternating pattern;Step to pattern;Forwards   Number of Stairs 4  x2   Height of Stairs 6   Curb 5: Supervision   Curb Details (indicate cue type and reason) vc for sequencing ~50% of trials (x 10 reps) with cane     High Level Balance  High Level Balance Comments static standing on blue beam in // bars; pt can maintain at most 8 seconds without UE support with noted Rt knee instability; x 8 reps with 15 second hold minimal use of UEs     Exercises   Exercises Other Exercises   Other Exercises  Leg Press, 30 # x 10 reps with emphasis on limited use of LLE and no genu recurvatum RLE             Balance Exercises - 11/20/16 1611      Balance Exercises: Standing   Step Ups Forward;2 inch;Intermittent UE support  in //bars onto blue beam; RLE ascend/descend to strengthen           PT Education - 11/20/16 1616    Education provided Yes   Education Details importance  of HEP compliance especially focusing on RLE strength; reviewed HEP for questions   Person(s) Educated Patient   Methods Explanation   Comprehension Verbalized understanding;Need further instruction          PT Short Term Goals - 11/13/16 1322      PT SHORT TERM GOAL #1   Title pt/family to report compliance with HEP (Target Date for all STGs: 11/04/16)   Baseline needed max cues to complete HEP   Status Not Met     PT SHORT TERM GOAL #2   Title improve BERG balance score to > 37/56 for improved balance and decreased fall risk    Baseline 45/56 on 11/13/16   Status Achieved     PT SHORT TERM GOAL #3   Title improve timed up and go to < 20 sec with LRAD for imroved mobility    Baseline 29.06 sec   Time 20.72  on 11/13/16   Status Partially Met     PT SHORT TERM GOAL #4   Title improve gait velocity to > 2.0 ft/sec for improved mobility    Baseline 1.46 ft/sec, down from 1.76 ft/sec at eval   Time --  with [facility] 11/13/16 (22.47 sec)   Status Not Met     PT SHORT TERM GOAL #5   Title amb > 250' with LRAD with supervision on level indoor/paved outdoor surfaces for improved community access   Baseline met-Mod I with SPC   Status Achieved           PT Long Term Goals - 11/13/16 1410      PT LONG TERM GOAL #1   Title verbalize understanding of CVA risk factors/warning signs (Target Date for all LTGs: 12/02/16)   Baseline no education   Status On-going     PT LONG TERM GOAL #2   Title improve BERG balance score to >/=52/56 for improved balance and decreased fall risk    Baseline updated 11/13/16 due to progress   Status Revised     PT LONG TERM GOAL #3   Title improve timed up and go to < 16 sec with LRAD for imroved mobility    Baseline 29.06 sec   Status On-going     PT LONG TERM GOAL #4   Title improve gait velocity to > 2.5 ft/sec for improved mobility    Baseline 1.76 ft/sec   Status On-going     PT LONG TERM GOAL #5   Title amb > 500' without AD at mod I level  on various indoor surfaces for improved household ambulation.    Baseline updated 11/13/16 due to progress   Status Revised     Additional  Long Term Goals   Additional Long Term Goals Yes     PT LONG TERM GOAL #6   Title Pt will ambulate 1000' with LRAD at mod I level in order to indicate safe return to community.    Status New  added 11/13/16               Plan - 11/20/16 1621    Clinical Impression Statement Skilled session focusing on HEP compliance (including prioritizing sit to stand without UE support and ambulation for distance--ie through stores, mall), RLE strengthening, balance, and gait training with SPC. Patient thankful at end of session that she was fatigued and "had a workout."   Rehab Potential Good   Clinical Impairments Affecting Rehab Potential severity of deficits, decreased awareness/attention/cognition, insurance visit limitations   PT Frequency 1x / week   PT Duration 8 weeks   PT Treatment/Interventions ADLs/Self Care Home Management;Cryotherapy;Moist Heat;Electrical Stimulation;Cognitive remediation;Neuromuscular re-education;Balance training;Therapeutic exercise;Therapeutic activities;Functional mobility training;Stair training;Gait training;DME Instruction;Patient/family education;Vestibular   PT Next Visit Plan gait training without SPC (for goal household amb no device) and with SPC (uneven/outside terrain), continue to work on right LE strengthening and balance, check compliance with HEP   Consulted and Agree with Plan of Care Patient;Other (Comment)  no family present      Patient will benefit from skilled therapeutic intervention in order to improve the following deficits and impairments:  Abnormal gait, Decreased balance, Decreased coordination, Decreased mobility, Difficulty walking, Decreased strength, Decreased knowledge of use of DME, Impaired sensation, Decreased safety awareness, Decreased cognition, Decreased endurance  Visit  Diagnosis: Hemiplegia and hemiparesis following cerebral infarction affecting right dominant side ([facility])  Muscle weakness (generalized)  Other abnormalities of gait and mobility     Problem List Patient Active Problem List   Diagnosis Date Noted  . Right thigh pain 09/10/2016  . Neck muscle strain 09/10/2016  . Healthcare maintenance 09/10/2016  . Neck pain on left side 06/19/2016  . Essential hypertension, benign 05/27/2016  . Medical non-compliance   . Prediabetes   . RUE weakness   . Intracranial vascular stenosis   . Stroke ([place]) 05/16/2016  . Cerebrovascular accident (CVA) ([place])   . Essential hypertension   . Obesity   . Abnormal EKG   . Hypertension 09/17/2012  . FATIGUE 01/06/2010  . CONSTIPATION 03/22/2009  . HELICOBACTER PYLORI INFECTION, HX OF 01/28/2009  . ALLERGIC RHINITIS 03/02/2008  . ABDOMINAL PAIN 03/02/2008  . Hyperlipemia 07/18/2007    Kathleen Larson. PT 11/20/2016, 4:30 PM  [facility] [address] [place], [place], [address] Phone: [phone]   Fax:  [phone]  Name: Kathleen Larson MRN: [id number] Date of Birth: 11/08/1966

## 2016-11-20 NOTE — Assessment & Plan Note (Signed)
Due to residual weakness 4/5 on R side patient has regular PT visits.  Has been ramping up with exercises recently. On exam does have tight IT band on R side and is quite tender with ober's test. No point tenderness, increased weakness from her baseline, numbness or tingling.  - recommended trying clamshell exercises and to discuss tightness with PT at next session - tylenol/ibuprofen PRN - follow up in 4 weeks if not improving

## 2016-11-20 NOTE — Patient Instructions (Signed)
Today you had a pap-smear, which will screen for cervical cancer.  Also, I think that your right leg pain is due to a tight IT band that will loosen up with exercise.  I want to you to try the clam shell exercises that I showed you.  Also, you can mention to your physical therapist that your leg is hurting and they can also suggest some exercises.  Your blood pressure was low today in the office and rather than change your medication I want you to take your BP tomorrow at the pharmacy and if it is still low, only take one lisinopril/HCTZ and amlodipine that day and then I want you to follow up in clinic early next week.  If the pressures are normal <120/80-140/90 continue taking your medicines regularly.   Please follow up in one month.   Great to see you, Rande BruntDaniel L. [name], MD [facility] Resident PGY-1 11/20/2016 4:17 PM

## 2016-11-20 NOTE — Assessment & Plan Note (Signed)
Goal BP would be <140/90 for this patient. BP this visit was 104/70.  Denies SOB or dizziness.  Did have decreased PO intake today.  OSV: lying: 102/74 (82); sitting 112/88 (84), standing 108/84 (80).  Due to the difficulty of titrating this patient's medication and lack of symptoms at today's visit, I would prefer to not make medication changes based on one reading. - take BP at pharmacy tomorrow and on Sunday - if BP continues to be low only take one lisinopril/HCTZ and amlodipine that day and follow up on Monday - if BP WNL continue with daily regimen

## 2016-11-20 NOTE — Therapy (Signed)
[facility] [address] [place], [place], [address] Phone: [phone]   Fax:  [phone]  Occupational Therapy Treatment  Patient Details  Name: Kathleen Larson MRN: [id number] Date of Birth: 1966/06/05 Referring Provider: Dr. Reuel Boom L. Myrtie Soman  Encounter Date: 11/20/2016      OT End of Session - 11/20/16 1200    Visit Number 5   Number of Visits 9   Date for OT Re-Evaluation 12/15/16   Authorization Type MCD approved 8 visits from 10/21/16 - 12/15/16   Authorization - Visit Number 4   Authorization - Number of Visits 8   OT Start Time 1146   OT Stop Time 1230   OT Time Calculation (min) 44 min      Past Medical History:  Diagnosis Date  . Allergy   . GERD (gastroesophageal reflux disease)   . Hypertension     Past Surgical History:  Procedure Laterality Date  . TUBAL LIGATION      There were no vitals filed for this visit.      Subjective Assessment - 11/20/16 1219    Subjective  Pt reports hip pain   Pertinent History See EPIC; HTN; Falls ("~4x since stroke"); Aphasia   Patient Stated Goals "Get stronger, get back to normal"   Currently in Pain? Yes   Pain Score 4    Pain Location Hip   Pain Orientation Right   Pain Descriptors / Indicators Aching   Pain Type Chronic pain   Pain Onset More than a month ago   Pain Frequency Constant   Aggravating Factors  worse at night   Pain Relieving Factors tylenol           Standing to copy small peg design with RUE, min difficulty, v.c. For RUE functional use.Theapist checked progress towards goals. Reviewed putty exercises, and picking up stacking and manipulating coins in hand. Pt returned demonstration. Removing graded clothespins from vertical antennae with RUE for sustained  pinch and functional reach. Arm bIke x 5 mins level 3 for reciprocal movment                   OT Education - 11/20/16 1220    Education provided Yes    Education Details memory compensation strategies   Person(s) Educated Patient   Methods Explanation;Demonstration;Verbal cues;Handout   Comprehension Verbalized understanding          OT Short Term Goals - 11/20/16 1155      OT SHORT TERM GOAL #1   Title Pt will be Mod I signs and symptoms of CVA    Baseline Dependent   Time 4   Period Weeks   Status Achieved     OT SHORT TERM GOAL #2   Title Pt will be Mod I HEP for RUE FM/coordination   Baseline Dependent    Time 4   Period Weeks   Status Achieved     OT SHORT TERM GOAL #3   Title Pt will be Mod I HEP for RUE proximal strengthening and grip as seen by improved grip strength by 5# or more R hand   Baseline Dependent/See Eval 10/07/16 (baseline 21#)   Time 4   Period Weeks   Status On-going  25 #     OT SHORT TERM GOAL #4   Title Pt will state 2-3 memory strategies given Min A/Vc's   Baseline 10/07/16 Unable   Time 4   Period Weeks   Status On-going  education performed  need to review           OT Long Term Goals - 10/07/16 1147      OT LONG TERM GOAL #1   Title Pt will demonstrate improved coordination as seen by improved 9 hole peg score by 5 seconds or more RUE   Baseline See Eval 10/07/16   Time 8   Period Weeks   Status New     OT LONG TERM GOAL #2   Title Pt will be Mod I LB ADL's (tying shoes and fastening buttons/snaps).   Baseline Supervision-Min A 10/07/16   Time 8   Period Weeks   Status New     OT LONG TERM GOAL #3   Title Pt will be Mod I simple snack or meal prep while maintaining safety and balance in ADL kitchen   Baseline 10/07/16 Family preparing meals   Time 8   Period Weeks   Status New     OT LONG TERM GOAL #4   Title Pt will independently state 2-3 memory strategies & implement during ADL's   Baseline 10/07/16 Supervision-min A   Time 8   Period Weeks   Status New     OT LONG TERM GOAL #5   Title Pt will demonstrate improved RUE strength as seen by MMT of 4/5  throughout   Baseline 10/07/16 3-/5 RUE   Time 8   Period Weeks   Status New               Plan - 11/20/16 1214    Clinical Impression Statement Pt demonstrates good progress towards short term goals.   Rehab Potential Good   Clinical Impairments Affecting Rehab Potential Decreaed awareness of deficits; cognition   OT Frequency 2x / week   OT Duration 8 weeks   OT Treatment/Interventions Self-care/ADL training;DME and/or AE instruction;Splinting;Patient/family education;Balance training;Therapeutic exercises;Therapeutic exercise;Therapeutic activities;Cognitive remediation/compensation;Passive range of motion;Neuromuscular education;Functional Mobility Training;Energy conservation;Manual Therapy;Visual/perceptual remediation/compensation;Electrical Stimulation   Plan continue to work towards long term goals, simple meal prep   Consulted and Agree with Plan of Care Patient      Patient will benefit from skilled therapeutic intervention in order to improve the following deficits and impairments:  Decreased coordination, Decreased range of motion, Decreased endurance, Decreased safety awareness, Impaired sensation, Decreased knowledge of precautions, Decreased activity tolerance, Decreased balance, Decreased knowledge of use of DME, Impaired UE functional use, Pain, Impaired vision/preception, Impaired perceived functional ability, Decreased strength, Decreased cognition, Decreased mobility  Visit Diagnosis: No diagnosis found.    Problem List Patient Active Problem List   Diagnosis Date Noted  . Leg pain, right 09/10/2016  . Neck muscle strain 09/10/2016  . Healthcare maintenance 09/10/2016  . Neck pain on left side 06/19/2016  . Essential hypertension, benign 05/27/2016  . Medical non-compliance   . Prediabetes   . RUE weakness   . Intracranial vascular stenosis   . Stroke (HCC) 05/16/2016  . Cerebrovascular accident (CVA) (HCC)   . Essential hypertension   . Obesity    . Abnormal EKG   . Hypertension 09/17/2012  . FATIGUE 01/06/2010  . CONSTIPATION 03/22/2009  . HELICOBACTER PYLORI INFECTION, HX OF 01/28/2009  . ALLERGIC RHINITIS 03/02/2008  . ABDOMINAL PAIN 03/02/2008  . Hyperlipemia 07/18/2007    Shi Blankenship 11/20/2016, 12:21 PM  [facility] [address] [place], [place], [address] Phone: [phone]   Fax:  [phone]  Name: Kathleen Larson MRN: [id number] Date of Birth: 08/05/1966

## 2016-11-20 NOTE — Progress Notes (Addendum)
    Subjective:  Kathleen Larson is a 51 y.o. female who [facility] with a chief complaint of right thigh pain  HPI:  Right thigh pain: Residual weakness from stroke and has been working hard at physical therapy.  Has 1-2 sessions per week.  Denies any weakness numbness/tingling/trauma. Did have a physical therapy session today.    Hypertension: History of resistant hypertension, but has been controlled on amlodipine 10mg  daily and lisinopril/HCTZ 20/12.5mg  BID.  At PT her BP was 128/89 and in the office it was 104/70.  She denies any weakness, SOB, dizziness, but does feel a "bit off".  She did not have much to eat today and only had a small bit of cranberry juice to drink.    PMH: CVA, resistant HTN Tobacco use: never smoked Medication: reviewed and updated ROS: see HPI   Objective:  Physical Exam: BP 104/70 (BP Location: Left Arm, Patient Position: Sitting, Cuff Size: Large)   Pulse 90   Temp 97.9 F (36.6 C) (Oral)   Ht 5\' 5"  (1.651 m)   Wt 104.4 kg (230 lb 3.2 oz)   LMP 10/14/2012   SpO2 98%   BMI 38.31 kg/m    Chaperone present for GU exam  Gen: 50yo M in NAD, resting comfortably CV: RRR with no murmurs appreciated Pulm: NWOB, CTAB with no crm k,ackles, wheezes, or rhonchi GI: Normal bowel sounds present. Soft, Nontender, Nondistended. MSK: no edema, cyanosis, or clubbing noted, FROM, strength 4/5 RLE and RUE and decreased grip strength R>L.  Pos ober's test R side with tenderness to palpation along IT band. No point tenderness.  Skin: warm, dry GU: normal EFG, no discharge or blood in vaginal vault, few nabothian cysts in a circumferential pattern around cervix, otherwise normal appearing cervix  Neuro: CN2-10 intact, sensation intact, AAOx3 Psych: Normal affect and thought content  OSV: lying: 102/74 (82); sitting 112/88 (84), standing 108/84 (80)   No results found for this or any previous visit (from the past 72 hour(s)).   Assessment/Plan:   Right thigh pain Due to residual weakness 4/5 on R side patient has regular PT visits.  Has been ramping up with exercises recently. On exam does have tight IT band on R side and is quite tender with ober's test. No point tenderness, increased weakness from her baseline, numbness or tingling.  - recommended trying clamshell exercises and to discuss tightness with PT at next session - tylenol/ibuprofen PRN - follow up in 4 weeks if not improving  Hypertension Goal BP would be <140/90 for this patient. BP this visit was 104/70.  Denies SOB or dizziness.  Did have decreased PO intake today.  OSV: lying: 102/74 (82); sitting 112/88 (84), standing 108/84 (80).  Due to the difficulty of titrating this patient's medication and lack of symptoms at today's visit, I would prefer to not make medication changes based on one reading. - take BP at pharmacy tomorrow and on Sunday - if BP continues to be low only take one lisinopril/HCTZ and amlodipine that day and follow up on Monday - if BP WNL continue with daily regimen

## 2016-11-23 LAB — CERVICOVAGINAL ANCILLARY ONLY
Chlamydia: NEGATIVE
Neisseria Gonorrhea: NEGATIVE

## 2016-11-24 ENCOUNTER — Encounter: Payer: Self-pay | Admitting: Family Medicine

## 2016-11-25 ENCOUNTER — Ambulatory Visit: Payer: Medicaid Other

## 2016-11-27 ENCOUNTER — Ambulatory Visit: Payer: Medicaid Other | Admitting: Physical Therapy

## 2016-11-27 ENCOUNTER — Encounter: Payer: Medicaid Other | Admitting: Occupational Therapy

## 2016-11-27 LAB — CYTOLOGY - PAP: HPV: DETECTED — AB

## 2016-12-04 ENCOUNTER — Encounter: Payer: Self-pay | Admitting: Physical Therapy

## 2016-12-04 ENCOUNTER — Ambulatory Visit: Payer: Medicaid Other | Admitting: Physical Therapy

## 2016-12-04 ENCOUNTER — Ambulatory Visit: Payer: Medicaid Other | Admitting: Occupational Therapy

## 2016-12-04 DIAGNOSIS — M6281 Muscle weakness (generalized): Secondary | ICD-10-CM

## 2016-12-04 DIAGNOSIS — R2689 Other abnormalities of gait and mobility: Secondary | ICD-10-CM

## 2016-12-04 DIAGNOSIS — R278 Other lack of coordination: Secondary | ICD-10-CM

## 2016-12-04 DIAGNOSIS — I69351 Hemiplegia and hemiparesis following cerebral infarction affecting right dominant side: Secondary | ICD-10-CM

## 2016-12-04 NOTE — Progress Notes (Signed)
ya

## 2016-12-04 NOTE — Therapy (Signed)
[facility] [address] [place], [place], [address] Phone: [phone]   Fax:  [phone]  Occupational Therapy Treatment  Patient Details  Name: Kathleen Larson MRN: [id number] Date of Birth: [birth year] Referring Provider: Dr. Quillian Quince L. Rosalyn Gess  Encounter Date: 12/04/2016      OT End of Session - 12/04/16 1216    Visit Number 6   Number of Visits 9   Date for OT Re-Evaluation 12/15/16   Authorization Type MCD approved 8 visits from 10/21/16 - 12/15/16   Authorization - Visit Number 5   Authorization - Number of Visits 8   OT Start Time 0017   OT Stop Time 4944   OT Time Calculation (min) 49 min      Past Medical History:  Diagnosis Date  . Allergy   . GERD (gastroesophageal reflux disease)   . Hypertension     Past Surgical History:  Procedure Laterality Date  . TUBAL LIGATION      There were no vitals filed for this visit.      Subjective Assessment - 12/04/16 1306    Subjective  Pt reports she has not cooked at home.   Pertinent History See EPIC; HTN; Falls ("~4x since stroke"); Aphasia   Patient Stated Goals "Get stronger, get back to normal"   Currently in Pain? No/denies       Treatment: Basic cooking task to make a grilled cheese sandwich, pt gathered items yet was moving quicly and left cabinets open and almost bumped her head. Pt assembled and cooked the sandwich, min v.c for use of spatula instead of getting fingers too close to pan. Pt started to clean up items and forgot to turn off stove.  Therapist recommends pt does not cook without assist. Pt was instructed in updated HEP, see pt instructions. Arm bike x 5 mins level 3, min v.c. To maintain speed for conditioning. Therapsit started checking long term goals.                       OT Education - 12/04/16 1313    Education provided Yes   Education Details ball ex seated   Person(s) Educated Patient   Methods  Explanation;Demonstration;Verbal cues;Tactile cues;Handout   Comprehension Verbalized understanding;Returned demonstration          OT Short Term Goals - 11/20/16 1155      OT SHORT TERM GOAL #1   Title Pt will be Mod I signs and symptoms of CVA    Baseline Dependent   Time 4   Period Weeks   Status Achieved     OT SHORT TERM GOAL #2   Title Pt will be Mod I HEP for RUE FM/coordination   Baseline Dependent    Time 4   Period Weeks   Status Achieved     OT SHORT TERM GOAL #3   Title Pt will be Mod I HEP for RUE proximal strengthening and grip as seen by improved grip strength by 5# or more R hand   Baseline Dependent/See Eval 10/07/16 (baseline 21#)   Time 4   Period Weeks   Status On-going  25 #     OT SHORT TERM GOAL #4   Title Pt will state 2-3 memory strategies given Min A/Vc's   Baseline 10/07/16 Unable   Time 4   Period Weeks   Status On-going  education performed need to review           OT  Long Term Goals - 12/04/16 1206      OT LONG TERM GOAL #1   Title Pt will demonstrate improved coordination as seen by improved 9 hole peg score by 5 seconds or more RUE   Baseline See Eval 10/07/16   Time 8   Period Weeks   Status Achieved  32.15 secs     OT LONG TERM GOAL #2   Title Pt will be Mod I LB ADL's (tying shoes and fastening buttons/snaps).   Baseline Supervision-Min A 10/07/16   Time 8   Period Weeks   Status Achieved     OT LONG TERM GOAL #3   Title Pt will be Mod I simple snack or meal prep while maintaining safety and balance in ADL kitchen   Baseline 10/07/16 Family preparing meals   Time 8   Period Weeks   Status Not Met  maintained balance, forgot to turn off stove     OT LONG TERM GOAL #4   Title Pt will independently state 2-3 memory strategies & implement during ADL's   Baseline 10/07/16 Supervision-min A   Time 8   Period Weeks   Status Achieved     OT LONG TERM GOAL #5   Title Pt will demonstrate improved RUE strength as  seen by MMT of 4/5 throughout   Baseline 10/07/16 3-/5 RUE   Time 8   Period Weeks   Status On-going               Plan - 12/04/16 1306    Clinical Impression Statement Pt is progressing towards goals, however she demonstrates short term emory deficits and impulsivity which impede safety for cooking   Rehab Potential Good   Clinical Impairments Affecting Rehab Potential Decreaed awareness of deficits; cognition   OT Frequency 2x / week   OT Duration 8 weeks   OT Treatment/Interventions Self-care/ADL training;DME and/or AE instruction;Splinting;Patient/family education;Balance training;Therapeutic exercises;Therapeutic exercise;Therapeutic activities;Cognitive remediation/compensation;Passive range of motion;Neuromuscular education;Functional Mobility Training;Energy conservation;Manual Therapy;Visual/perceptual remediation/compensation;Electrical Stimulation   Plan review, progress HEP., check to see how pt is doing with home management   Consulted and Agree with Plan of Care Patient      Patient will benefit from skilled therapeutic intervention in order to improve the following deficits and impairments:  Decreased coordination, Decreased range of motion, Decreased endurance, Decreased safety awareness, Impaired sensation, Decreased knowledge of precautions, Decreased activity tolerance, Decreased balance, Decreased knowledge of use of DME, Impaired UE functional use, Pain, Impaired vision/preception, Impaired perceived functional ability, Decreased strength, Decreased cognition, Decreased mobility  Visit Diagnosis: Muscle weakness (generalized)  Other lack of coordination  Other abnormalities of gait and mobility  Hemiplegia and hemiparesis following cerebral infarction affecting right dominant side [facility])    Problem List Patient Active Problem List   Diagnosis Date Noted  . Right thigh pain 09/10/2016  . Neck muscle strain 09/10/2016  . Healthcare maintenance 09/10/2016   . Neck pain on left side 06/19/2016  . Essential hypertension, benign 05/27/2016  . Medical non-compliance   . Prediabetes   . RUE weakness   . Intracranial vascular stenosis   . Stroke ([place]) 05/16/2016  . Cerebrovascular accident (CVA) ([place])   . Essential hypertension   . Obesity   . Abnormal EKG   . Hypertension 09/17/2012  . FATIGUE 01/06/2010  . CONSTIPATION 03/22/2009  . HELICOBACTER PYLORI INFECTION, HX OF 01/28/2009  . ALLERGIC RHINITIS 03/02/2008  . ABDOMINAL PAIN 03/02/2008  . Hyperlipemia 07/18/2007    Lanay Zinda 12/04/2016, 1:13 PM  [facility] [address] [place], [place], [address] Phone: [phone]   Fax:  [phone]  Name: MELICIA ESQUEDA MRN: [id number] Date of Birth: November 15, 1965

## 2016-12-04 NOTE — Patient Instructions (Signed)
Seated hold a ball between your hands , raise arms up to just above shoulder height without elevating shoulder or arching back,then  lower ball back to your lap. Keep elbows straight.   Perform 2 sets of 10 reps 1x day  Seated hold a ball in between both hands, straighten elbows, then bend elbows and bring ball to your chest     Perform 2 sets of 10 reps 1x day  Try to use your right hand for picking up light items, do not pick up anything sharp, hot or breakable with right hand.  Try cooking only with someone, do not cook alone because you may forget to turn of the stove.

## 2016-12-04 NOTE — Therapy (Signed)
[facility] [address] [place], [place], [address] Phone: [phone]   Fax:  [phone]  Physical Therapy Treatment  Patient Details  Name: Kathleen SCHLOTZHAUER MRN: [id number] Date of Birth: October 04, 1966 Referring Provider: Eloise Levels, MD  Encounter Date: 12/04/2016      PT End of Session - 12/04/16 1145    Visit Number 6   Number of Visits 9   Date for PT Re-Evaluation 12/16/16  revised   Authorization Type Medicaid - 8 visits 10/22/16--12/16/16    PT Start Time 1103   PT Stop Time 1143   PT Time Calculation (min) 40 min   Activity Tolerance Patient tolerated treatment well   Behavior During Therapy [facility] for tasks assessed/performed      Past Medical History:  Diagnosis Date  . Allergy   . GERD (gastroesophageal reflux disease)   . Hypertension     Past Surgical History:  Procedure Laterality Date  . TUBAL LIGATION      There were no vitals filed for this visit.      Subjective Assessment - 12/04/16 1107    Subjective No falls, reports HEP is going well.  Still walking outside with sister when weather is warm.   Currently in Pain? Yes   Pain Score 7    Pain Location Hip   Pain Orientation Right   Pain Descriptors / Indicators Aching   Pain Type Chronic pain   Pain Frequency Constant   Aggravating Factors  while sleeping and lying on R side          [facility] Adult PT Treatment/Exercise - 12/04/16 1109      Ambulation/Gait   Ambulation/Gait Yes   Ambulation/Gait Assistance 5: Supervision   Ambulation Distance (Feet) 300 Feet   Assistive device None   Gait Pattern Step-through pattern;Decreased step length - right;Decreased stance time - right;Decreased stride length;Decreased hip/knee flexion - right;Decreased dorsiflexion - right;Decreased weight shift to right;Antalgic;Wide base of support;Poor foot clearance - right   Ambulation Surface Level;Outdoor   Stairs Yes   Stairs Assistance 5:  Supervision;4: Restaurant manager, fast food Details (indicate cue type and reason) Performed alternating method to focus on strengthening and activation of RLE hip/knee flexion, foot clearance and hip extension + lateral weight shifting   Stair Management Technique Two rails;Alternating pattern;Forwards   Number of Stairs 8   Height of Stairs 6     Neuro Re-ed    Neuro Re-ed Details  Performed sit <> stand from chair seated beside countertop, RUE on countertop.  Pt cued to slide RUE forwards and R along counter and perform sit > stand with R hip moving towards counter to facilitate increased lateral weight shift and use of RLE during sit > stand, tactile cues for slow eccentric stand > sit with increased control through RLE, 2 sets x 5 reps.          Balance Exercises - 12/04/16 1118      Balance Exercises: Standing   Retro Gait 3 reps;Upper extremity support  verbal cues for R hamstring activation and full step length   Sidestepping Upper extremity support;4 reps;Other (comment)  R<>L side stepping with heel raise; toe raise   Heel Raises Limitations bilat UE support on counter, 10 reps   Toe Raise Limitations bilat UE support on counter, 10 reps     OTAGO PROGRAM   Knee Flexor 10 reps  R and L  PT Education - 12/04/16 1143    Education provided Yes   Education Details gait without AD; areas to continue to focus on   Person(s) Educated Patient   Methods Explanation   Comprehension Verbalized understanding          PT Short Term Goals - 11/13/16 1322      PT SHORT TERM GOAL #1   Title pt/family to report compliance with HEP (Target Date for all STGs: 11/04/16)   Baseline needed max cues to complete HEP   Status Not Met     PT SHORT TERM GOAL #2   Title improve BERG balance score to > 37/56 for improved balance and decreased fall risk    Baseline 45/56 on 11/13/16   Status Achieved     PT SHORT TERM GOAL #3   Title improve timed up and go to < 20 sec  with LRAD for imroved mobility    Baseline 29.06 sec   Time 20.72  on 11/13/16   Status Partially Met     PT SHORT TERM GOAL #4   Title improve gait velocity to > 2.0 ft/sec for improved mobility    Baseline 1.46 ft/sec, down from 1.76 ft/sec at eval   Time --  with [facility] 11/13/16 (22.47 sec)   Status Not Met     PT SHORT TERM GOAL #5   Title amb > 250' with LRAD with supervision on level indoor/paved outdoor surfaces for improved community access   Baseline met-Mod I with SPC   Status Achieved           PT Long Term Goals - 12/04/16 1258      PT LONG TERM GOAL #1   Title verbalize understanding of CVA risk factors/warning signs (REVISED TARGET DATE FOR ALL LTG 12/16/16)   Baseline no education   Status On-going     PT LONG TERM GOAL #2   Title improve BERG balance score to >/=52/56 for improved balance and decreased fall risk    Baseline updated 11/13/16 due to progress   Status On-going     PT LONG TERM GOAL #3   Title improve timed up and go to < 16 sec with LRAD for imroved mobility    Baseline 29.06 sec   Status On-going     PT LONG TERM GOAL #4   Title improve gait velocity to > 2.5 ft/sec for improved mobility    Baseline 1.76 ft/sec   Status On-going     PT LONG TERM GOAL #5   Title amb > 500' without AD at mod I level on various indoor surfaces for improved household ambulation.    Baseline updated 11/13/16 due to progress   Status On-going     PT LONG TERM GOAL #6   Title Pt will ambulate 1000' with LRAD at mod I level in order to indicate safe return to community.    Status New  added 11/13/16               Plan - 12/04/16 1149    Clinical Impression Statement Skilled session with focus on assessment of gait without AD to determine continued strength and balance impairments to address.  Pt noted to have decreased activation of R hip flexors, knee flexors and ankle DF.  Focused standing balance and strengthening exercises on activating these specific  muscles in addition to increased use/activation of RLE during stair negotiation and sit <> stand.  Pt tolerated well but LE fatigued at end of session.  Will benefit from continued PT to maximize functional mobility independence.     Rehab Potential Good   Clinical Impairments Affecting Rehab Potential severity of deficits, decreased awareness/attention/cognition, insurance visit limitations   PT Frequency 1x / week   PT Duration 8 weeks   PT Treatment/Interventions ADLs/Self Care Home Management;Cryotherapy;Moist Heat;Electrical Stimulation;Cognitive remediation;Neuromuscular re-education;Balance training;Therapeutic exercise;Therapeutic activities;Functional mobility training;Stair training;Gait training;DME Instruction;Patient/family education;Vestibular   PT Next Visit Plan Continued gait training without SPC-more dynamic challenges, stairs with alternating method.  Continue to strengthen hip ABD, hip and knee flexion, sit > stand with increased use of RLE   Consulted and Agree with Plan of Care Patient      Patient will benefit from skilled therapeutic intervention in order to improve the following deficits and impairments:  Abnormal gait, Decreased balance, Decreased coordination, Decreased mobility, Difficulty walking, Decreased strength, Decreased knowledge of use of DME, Impaired sensation, Decreased safety awareness, Decreased cognition, Decreased endurance  Visit Diagnosis: Muscle weakness (generalized)  Hemiplegia and hemiparesis following cerebral infarction affecting right dominant side (HCC)  Other abnormalities of gait and mobility     Problem List Patient Active Problem List   Diagnosis Date Noted  . Right thigh pain 09/10/2016  . Neck muscle strain 09/10/2016  . Healthcare maintenance 09/10/2016  . Neck pain on left side 06/19/2016  . Essential hypertension, benign 05/27/2016  . Medical non-compliance   . Prediabetes   . RUE weakness   . Intracranial vascular  stenosis   . Stroke ([place]) 05/16/2016  . Cerebrovascular accident (CVA) ([place])   . Essential hypertension   . Obesity   . Abnormal EKG   . Hypertension 09/17/2012  . FATIGUE 01/06/2010  . CONSTIPATION 03/22/2009  . HELICOBACTER PYLORI INFECTION, HX OF 01/28/2009  . ALLERGIC RHINITIS 03/02/2008  . ABDOMINAL PAIN 03/02/2008  . Hyperlipemia 07/18/2007    Raylene Everts, PT, DPT 12/04/16    1:06 PM    [facility] [address] [place], [place], [address] Phone: [phone]   Fax:  [phone]  Name: PATRECIA VEIGA MRN: [id number] Date of Birth: 02/29/1966

## 2016-12-08 ENCOUNTER — Ambulatory Visit
Admission: RE | Admit: 2016-12-08 | Discharge: 2016-12-08 | Disposition: A | Discharge status: Home / Self Care | Payer: Medicaid Other | Source: Ambulatory Visit | Attending: Family Medicine | Admitting: Family Medicine

## 2016-12-08 DIAGNOSIS — Z1231 Encounter for screening mammogram for malignant neoplasm of breast: Secondary | ICD-10-CM

## 2016-12-11 ENCOUNTER — Ambulatory Visit: Payer: Medicaid Other | Admitting: Occupational Therapy

## 2016-12-11 ENCOUNTER — Encounter: Payer: Self-pay | Admitting: Physical Therapy

## 2016-12-11 ENCOUNTER — Ambulatory Visit: Payer: Medicaid Other | Attending: Family Medicine | Admitting: Physical Therapy

## 2016-12-11 DIAGNOSIS — R278 Other lack of coordination: Secondary | ICD-10-CM

## 2016-12-11 DIAGNOSIS — R296 Repeated falls: Secondary | ICD-10-CM | POA: Diagnosis present

## 2016-12-11 DIAGNOSIS — M6281 Muscle weakness (generalized): Secondary | ICD-10-CM | POA: Insufficient documentation

## 2016-12-11 DIAGNOSIS — R2689 Other abnormalities of gait and mobility: Secondary | ICD-10-CM | POA: Diagnosis present

## 2016-12-11 DIAGNOSIS — I69818 Other symptoms and signs involving cognitive functions following other cerebrovascular disease: Secondary | ICD-10-CM | POA: Insufficient documentation

## 2016-12-11 DIAGNOSIS — I69351 Hemiplegia and hemiparesis following cerebral infarction affecting right dominant side: Secondary | ICD-10-CM | POA: Diagnosis present

## 2016-12-11 NOTE — Therapy (Signed)
[facility] [address] [place], [place], [address] Phone: [phone]   Fax:  [phone]  Occupational Therapy Treatment  Patient Details  Name: Kathleen Larson MRN: [id number] Date of Birth: 1965/12/28 Referring Provider: Dr. Quillian Quince L. Rosalyn Gess  Encounter Date: 12/11/2016      OT End of Session - 12/11/16 1226    Visit Number 7   Number of Visits 9   Date for OT Re-Evaluation 12/15/16   Authorization Type MCD approved 8 visits from 10/21/16 - 12/15/16   Authorization - Visit Number 6   Authorization - Number of Visits 8   OT Start Time 6378   OT Stop Time 1230   OT Time Calculation (min) 42 min   Activity Tolerance Patient tolerated treatment well   Behavior During Therapy WFL for tasks assessed/performed      Past Medical History:  Diagnosis Date  . Allergy   . GERD (gastroesophageal reflux disease)   . Hypertension     Past Surgical History:  Procedure Laterality Date  . TUBAL LIGATION      There were no vitals filed for this visit.      Subjective Assessment - 12/11/16 1149    Subjective  Pt    Pertinent History See EPIC; HTN; Falls ("~4x since stroke"); Aphasia   Patient Stated Goals "Get stronger, get back to normal"   Currently in Pain? Yes   Pain Score 5    Pain Location Hip   Pain Orientation Right   Pain Descriptors / Indicators Aching   Pain Type Chronic pain   Pain Radiating Towards hip, thight   Pain Onset More than a month ago   Pain Frequency Constant   Aggravating Factors  lying on it   Pain Relieving Factors tylenol   Multiple Pain Sites No              Treatment: Reviewed ball ex for shoulder flexion and chest press in seated, 2 sets of 10 reps, min v.c. For positioning.Reviewed red theraputty exercises, pt returned demonstration. Arm bike x 6 mins level 3 for conditioning, Functional overhead reaching to simulate putting away glasses in cabinet, following by  sustained pinch to place graded clothespins on a vertical antennae, min v.c. To avoid compensation.                  OT Short Term Goals - 12/11/16 1206      OT SHORT TERM GOAL #1   Title Pt will be Mod I signs and symptoms of CVA    Baseline Dependent   Time 4   Period Weeks   Status Achieved     OT SHORT TERM GOAL #2   Title Pt will be Mod I HEP for RUE FM/coordination   Baseline Dependent    Time 4   Period Weeks   Status Achieved     OT SHORT TERM GOAL #3   Title Pt will be Mod I HEP for RUE proximal strengthening and grip as seen by improved grip strength by 5# or more R hand   Baseline Dependent/See Eval 10/07/16 (baseline 21#)   Time 4   Period Weeks   Status On-going  25 #     OT SHORT TERM GOAL #4   Title Pt will state 2-3 memory strategies given Min A/Vc's   Baseline 10/07/16 Unable   Time 4   Period Weeks   Status Achieved  Pt verbalized understanding of 2 strategies  OT Long Term Goals - 12/11/16 1207      OT LONG TERM GOAL #1   Title Pt will demonstrate improved coordination as seen by improved 9 hole peg score by 5 seconds or more RUE   Baseline See Eval 10/07/16   Time 8   Period Weeks   Status Achieved  32.15 secs     OT LONG TERM GOAL #2   Title Pt will be Mod I LB ADL's (tying shoes and fastening buttons/snaps).   Baseline Supervision-Min A 10/07/16   Time 8   Period Weeks   Status Achieved     OT LONG TERM GOAL #3   Title Pt will be Mod I simple snack or meal prep while maintaining safety and balance in ADL kitchen   Baseline 10/07/16 Family preparing meals   Time 8   Period Weeks   Status Not Met  maintained balance, forgot to turn off stove     OT LONG TERM GOAL #4   Title Pt will independently state 2-3 memory strategies & implement during ADL's   Baseline 10/07/16 Supervision-min A   Time 8   Period Weeks   Status Achieved     OT LONG TERM GOAL #5   Title Pt will demonstrate improved RUE strength as  seen by MMT of 4/5 throughout   Baseline 10/07/16 3-/5 RUE   Time 8   Period Weeks   Status On-going               Plan - 12/11/16 1214    Clinical Impression Statement Pt is progressing towards goals for RUE strength and functional use.   Rehab Potential Good   Clinical Impairments Affecting Rehab Potential Decreased awareness of deficits; cognition   OT Frequency 2x / week   OT Duration 8 weeks   OT Treatment/Interventions Self-care/ADL training;DME and/or AE instruction;Splinting;Patient/family education;Balance training;Therapeutic exercises;Therapeutic exercise;Therapeutic activities;Cognitive remediation/compensation;Passive range of motion;Neuromuscular education;Functional Mobility Training;Energy conservation;Manual Therapy;Visual/perceptual remediation/compensation;Electrical Stimulation   Plan check goals, aniticipate d/c next visit   Consulted and Agree with Plan of Care Patient      Patient will benefit from skilled therapeutic intervention in order to improve the following deficits and impairments:  Decreased coordination, Decreased range of motion, Decreased endurance, Decreased safety awareness, Impaired sensation, Decreased knowledge of precautions, Decreased activity tolerance, Decreased balance, Decreased knowledge of use of DME, Impaired UE functional use, Pain, Impaired vision/preception, Impaired perceived functional ability, Decreased strength, Decreased cognition, Decreased mobility  Visit Diagnosis: Muscle weakness (generalized)  Other lack of coordination    Problem List Patient Active Problem List   Diagnosis Date Noted  . Right thigh pain 09/10/2016  . Neck muscle strain 09/10/2016  . Healthcare maintenance 09/10/2016  . Neck pain on left side 06/19/2016  . Essential hypertension, benign 05/27/2016  . Medical non-compliance   . Prediabetes   . RUE weakness   . Intracranial vascular stenosis   . Stroke ([place]) 05/16/2016  . Cerebrovascular  accident (CVA) ([place])   . Essential hypertension   . Obesity   . Abnormal EKG   . Hypertension 09/17/2012  . FATIGUE 01/06/2010  . CONSTIPATION 03/22/2009  . HELICOBACTER PYLORI INFECTION, HX OF 01/28/2009  . ALLERGIC RHINITIS 03/02/2008  . ABDOMINAL PAIN 03/02/2008  . Hyperlipemia 07/18/2007    Josilynn Losh 12/11/2016, 12:27 PM  [facility] [address] [place], [place], [address] Phone: [phone]   Fax:  [phone]  Name: Kathleen Larson MRN: [id number] Date of Birth: 28-Jul-1966

## 2016-12-11 NOTE — Therapy (Signed)
[facility] [address] [place], [place], [address] Phone: [phone]   Fax:  [phone]  Physical Therapy Treatment  Patient Details  Name: Kathleen Larson MRN: [id number] Date of Birth: 08/23/1966 Referring Provider: Eloise Levels, MD  Encounter Date: 12/11/2016      PT End of Session - 12/11/16 1106    Visit Number 7   Number of Visits 9   Date for PT Re-Evaluation 12/16/16  revised   Authorization Type Medicaid - 8 visits 10/22/16--12/16/16    PT Start Time 9357   PT Stop Time 1145   PT Time Calculation (min) 43 min   Activity Tolerance Patient tolerated treatment well   Behavior During Therapy [facility] for tasks assessed/performed      Past Medical History:  Diagnosis Date  . Allergy   . GERD (gastroesophageal reflux disease)   . Hypertension     Past Surgical History:  Procedure Laterality Date  . TUBAL LIGATION      There were no vitals filed for this visit.      Subjective Assessment - 12/11/16 1105    Subjective No new complaints. No falls to report. HEP is still challenging her.   Pertinent History HTN   Patient Stated Goals improve mobility, "get back in my heels"   Currently in Pain? Yes   Pain Score 5    Pain Location Hip   Pain Orientation Right   Pain Descriptors / Indicators Sore;Aching   Pain Type Chronic pain   Pain Onset More than a month ago   Pain Frequency Constant   Aggravating Factors  lying on right side at nightime   Pain Relieving Factors tylenol, resting            [facility] Adult PT Treatment/Exercise - 12/11/16 1108      Transfers   Transfers Sit to Stand;Stand to Sit   Sit to Stand 6: Modified independent (Device/Increase time);With upper extremity assist;From chair/3-in-1;From bed   Stand to Sit 6: Modified independent (Device/Increase time);With upper extremity assist;To bed;To chair/3-in-1     Ambulation/Gait   Ambulation/Gait Yes   Ambulation/Gait  Assistance 5: Supervision   Ambulation/Gait Assistance Details increased assistance needed with outdoor surfaces (pt reported about half way with outdoor surfaces that she usually has a cane, forgot it today). Will need to recheck outdoor/compliant surfaces with cane to see if more stable, at this time pt is min guard to supervision outdoors   Ambulation Distance (Feet) 530 Feet  x 1 indoor, 500 x 1 outdoor   Assistive device None   Gait Pattern Step-through pattern;Decreased stride length;Shuffle   Ambulation Surface Level;Indoor;Unlevel;Outdoor;Paved;Gravel;Grass   Gait velocity 15.00= 2.19 ft/sec with no AD     Berg Balance Test   Sit to Stand Able to stand without using hands and stabilize independently   Standing Unsupported Able to stand safely 2 minutes   Sitting with Back Unsupported but Feet Supported on Floor or Stool Able to sit safely and securely 2 minutes   Stand to Sit Sits safely with minimal use of hands   Transfers Able to transfer safely, minor use of hands   Standing Unsupported with Eyes Closed Able to stand 10 seconds safely   Standing Ubsupported with Feet Together Able to place feet together independently and stand 1 minute safely   From Standing, Reach Forward with Outstretched Arm Can reach confidently >25 cm (10")   From Standing Position, Pick up Object from Floor Able to pick up shoe  safely and easily   From Standing Position, Turn to Look Behind Over each Shoulder Looks behind one side only/other side shows less weight shift  left> right   Turn 360 Degrees Able to turn 360 degrees safely but slowly  > 5 sec's both ways   Standing Unsupported, Alternately Place Feet on Step/Stool Able to complete 4 steps without aid or supervision  needs supervision, no assist   Standing Unsupported, One Foot in [place] to plae foot ahead of the other independently and hold 30 seconds   Standing on One Leg Able to lift leg independently and hold > 10 seconds  LLE only    Total Score 50     Timed Up and Go Test   TUG Normal TUG   Normal TUG (seconds) 15.13  no AD            PT Short Term Goals - 11/13/16 1322      PT SHORT TERM GOAL #1   Title pt/family to report compliance with HEP (Target Date for all STGs: 11/04/16)   Baseline needed max cues to complete HEP   Status Not Met     PT SHORT TERM GOAL #2   Title improve BERG balance score to > 37/56 for improved balance and decreased fall risk    Baseline 45/56 on 11/13/16   Status Achieved     PT SHORT TERM GOAL #3   Title improve timed up and go to < 20 sec with LRAD for imroved mobility    Baseline 29.06 sec   Time 20.72  on 11/13/16   Status Partially Met     PT SHORT TERM GOAL #4   Title improve gait velocity to > 2.0 ft/sec for improved mobility    Baseline 1.46 ft/sec, down from 1.76 ft/sec at eval   Time --  with [facility] 11/13/16 (22.47 sec)   Status Not Met     PT SHORT TERM GOAL #5   Title amb > 250' with LRAD with supervision on level indoor/paved outdoor surfaces for improved community access   Baseline met-Mod I with SPC   Status Achieved           PT Long Term Goals - 12/11/16 1106      PT LONG TERM GOAL #1   Title verbalize understanding of CVA risk factors/warning signs (REVISED TARGET DATE FOR ALL LTG 12/16/16)   Baseline no education   Status On-going     PT LONG TERM GOAL #2   Title improve BERG balance score to >/=52/56 for improved balance and decreased fall risk    Baseline 12/11/16: increased from 45/56 to 50/56, not to goal level   Status Partially Met     PT LONG TERM GOAL #3   Title improve timed up and go to < 16 sec with LRAD for imroved mobility    Baseline 12/11/16: 15.13 sec's with no AD   Status Achieved     PT LONG TERM GOAL #4   Title improve gait velocity to > 2.5 ft/sec for improved mobility    Baseline 12/11/16: 2.19 ft/sec with no AD. (improved from 1.76 ft/sec at last check)   Status Partially Met     PT LONG TERM GOAL #5   Title amb > 500'  without AD at mod I level on various indoor surfaces for improved household ambulation.    Baseline 12/11/16: met today   Status On-going     PT LONG TERM GOAL #6   Title Pt  will ambulate 1000' with LRAD at mod I level in order to indicate safe return to community.    Baseline 12/11/16: not met with no AD- needs to be rechecked with cane next session   Status On-going  added 11/13/16            Plan - 12/11/16 1106    Clinical Impression Statement Today's skilled session began to check LTGs for anticipated discharge next session with pt meeting some goals and partially meeting 2. There are 2 more goals to be checked at next session, along with finalizing pt's HEP.                  Rehab Potential Good   Clinical Impairments Affecting Rehab Potential check remaining LTGs's (including CVA education which has not been done), finalize after PT HEP and discharge   PT Frequency 1x / week   PT Duration 8 weeks   PT Treatment/Interventions ADLs/Self Care Home Management;Cryotherapy;Moist Heat;Electrical Stimulation;Cognitive remediation;Neuromuscular re-education;Balance training;Therapeutic exercise;Therapeutic activities;Functional mobility training;Stair training;Gait training;DME Instruction;Patient/family education;Vestibular   Consulted and Agree with Plan of Care Patient      Patient will benefit from skilled therapeutic intervention in order to improve the following deficits and impairments:  Abnormal gait, Decreased balance, Decreased coordination, Decreased mobility, Difficulty walking, Decreased strength, Decreased knowledge of use of DME, Impaired sensation, Decreased safety awareness, Decreased cognition, Decreased endurance  Visit Diagnosis: Muscle weakness (generalized)  Other lack of coordination  Other abnormalities of gait and mobility  Hemiplegia and hemiparesis following cerebral infarction affecting right dominant side [facility])     Problem List Patient Active Problem  List   Diagnosis Date Noted  . Right thigh pain 09/10/2016  . Neck muscle strain 09/10/2016  . Healthcare maintenance 09/10/2016  . Neck pain on left side 06/19/2016  . Essential hypertension, benign 05/27/2016  . Medical non-compliance   . Prediabetes   . RUE weakness   . Intracranial vascular stenosis   . Stroke ([place]) 05/16/2016  . Cerebrovascular accident (CVA) ([place])   . Essential hypertension   . Obesity   . Abnormal EKG   . Hypertension 09/17/2012  . FATIGUE 01/06/2010  . CONSTIPATION 03/22/2009  . HELICOBACTER PYLORI INFECTION, HX OF 01/28/2009  . ALLERGIC RHINITIS 03/02/2008  . ABDOMINAL PAIN 03/02/2008  . Hyperlipemia 07/18/2007    Willow Ora, PTA, [facility] [address] [place], [place] [address] [phone] 12/11/16, 3:31 PM   Name: Kathleen Larson MRN: [id number] Date of Birth: 10/16/1966

## 2016-12-15 ENCOUNTER — Ambulatory Visit: Payer: Medicaid Other | Admitting: Occupational Therapy

## 2016-12-15 ENCOUNTER — Ambulatory Visit: Payer: Medicaid Other | Admitting: Physical Therapy

## 2016-12-15 DIAGNOSIS — M6281 Muscle weakness (generalized): Secondary | ICD-10-CM

## 2016-12-15 DIAGNOSIS — R278 Other lack of coordination: Secondary | ICD-10-CM

## 2016-12-15 DIAGNOSIS — R2689 Other abnormalities of gait and mobility: Secondary | ICD-10-CM

## 2016-12-15 DIAGNOSIS — I69351 Hemiplegia and hemiparesis following cerebral infarction affecting right dominant side: Secondary | ICD-10-CM

## 2016-12-15 DIAGNOSIS — R296 Repeated falls: Secondary | ICD-10-CM

## 2016-12-15 DIAGNOSIS — I69818 Other symptoms and signs involving cognitive functions following other cerebrovascular disease: Secondary | ICD-10-CM

## 2016-12-15 NOTE — Patient Instructions (Signed)
   Strengthening: Resisted Flexion   Hold tubing with __right___ arm(s) at side. Pull forward and up. Move shoulder through pain-free range of motion. Repeat __10__ times per set.  Do _1-2_ sessions per day , every other day   Strengthening: Resisted Extension   Hold tubing in [place]_right ____ hand(s), arm forward. Pull arm back, elbow straight. Repeat _10___ times per set. Do _1-2___ sessions per day, every other day.   Resisted Horizontal Abduction: Bilateral   Sit or stand, tubing in both hands, arms out in front. Keeping arms straight, pinch shoulder blades together and stretch arms out. Repeat _10___ times per set. Do _1-2___ sessions per day, every other day.   Elbow Flexion: Resisted   With tubing held in __right____ hand(s) and other end secured under foot, curl arm up as far as possible. Repeat _10___ times per set. Do _1-2___ sessions per day, every other day.    Elbow Extension: Resisted   Sit in chair with resistive band secured at armrest (or hold with other hand) and ___right____ elbow bent. Straighten elbow. Repeat _10___ times per set.  Do _1-2___ sessions per day, every other day.   Copyright  VHI. All rights reserved.     Continue to perform ball ex Seated hold a ball between your hands , raise arms up to just above shoulder height without elevating shoulder or arching back,then  lower ball back to your lap. Keep elbows straight.   Perform 2 sets of 10 reps 1x day  Seated hold a ball in between both hands, straighten elbows, then bend elbows and bring ball to your chest     Perform 2 sets of 10 reps 1x day   Do not cook using the stove or oven without supervision/ assistance, due to short term memory changes. Be careful when handling anything hot, sharp, or breakable with right hand due to sensory changes, you may be at increased risk for injury

## 2016-12-15 NOTE — Therapy (Signed)
[facility] [address] [place], [place], [address] Phone: [phone]   Fax:  [phone]  Physical Therapy Treatment/Discharge  Patient Details  Name: Kathleen Larson MRN: [id number] Date of Birth: 10/05/1966 Referring Provider: Eloise Levels, MD  Encounter Date: 12/15/2016      PT End of Session - 12/15/16 1059    Visit Number 8   Number of Visits 9   Authorization Type Medicaid - 8 visits 10/22/16--12/16/16    Authorization - Visit Number 7   Authorization - Number of Visits 8   PT Start Time 4818   PT Stop Time 1048  d/c visit   PT Time Calculation (min) 33 min   Equipment Utilized During Treatment Gait belt   Activity Tolerance Patient tolerated treatment well   Behavior During Therapy [facility] for tasks assessed/performed      Past Medical History:  Diagnosis Date  . Allergy   . GERD (gastroesophageal reflux disease)   . Hypertension     Past Surgical History:  Procedure Laterality Date  . TUBAL LIGATION      There were no vitals filed for this visit.      Subjective Assessment - 12/15/16 1058    Subjective doing well; reports doing HEP 2-3x/wk   Patient Stated Goals improve mobility, "get back in my heels"   Currently in Pain? No/denies                         [facility] Adult PT Treatment/Exercise - 12/15/16 1037      Ambulation/Gait   Ambulation/Gait Yes   Ambulation/Gait Assistance 7: Independent;6: Modified independent (Device/Increase time)   Ambulation Distance (Feet) 1100 Feet   Assistive device Straight cane;None   Gait Pattern Within Functional Limits;Poor foot clearance - right   Ambulation Surface Level;Unlevel;Indoor;Outdoor;Paved;Gravel;Grass   Gait velocity 2.38 ft/sec  13.81 sec     Exercises   Exercises Knee/Hip     Knee/Hip Exercises: Standing   Heel Raises Both;10 reps;5 seconds     Knee/Hip Exercises: Seated   Sit to Sand 10 reps;with UE support             Balance Exercises - 12/15/16 1039      Balance Exercises: Standing   Tandem Gait Forward;Retro;Upper extremity support;3 reps   Sidestepping Upper extremity support;3 reps   Other Standing Exercises amb on heels and marching forward/backwards with 1 UE support x 3 reps           PT Education - 12/15/16 1058    Education provided Yes   Education Details compliance with HEP, continue community fitness   Person(s) Educated Patient   Methods Explanation   Comprehension Verbalized understanding          PT Short Term Goals - 12/15/16 1100      PT SHORT TERM GOAL #1   Title pt/family to report compliance with HEP (Target Date for all STGs: 11/04/16)   Status Achieved     PT SHORT TERM GOAL #2   Title improve BERG balance score to > 37/56 for improved balance and decreased fall risk    Status Achieved     PT SHORT TERM GOAL #3   Title improve timed up and go to < 20 sec with LRAD for imroved mobility    Status Partially Met     PT SHORT TERM GOAL #4   Title improve gait velocity to > 2.0 ft/sec for improved mobility  Status Achieved     PT SHORT TERM GOAL #5   Title amb > 250' with LRAD with supervision on level indoor/paved outdoor surfaces for improved community access   Status Achieved           PT Long Term Goals - 12/15/16 1100      PT LONG TERM GOAL #1   Title verbalize understanding of CVA risk factors/warning signs (REVISED TARGET DATE FOR ALL LTG 12/16/16)   Status Achieved     PT LONG TERM GOAL #2   Title improve BERG balance score to >/=52/56 for improved balance and decreased fall risk    Baseline 12/11/16: increased from 45/56 to 50/56, not to goal level   Status Partially Met     PT LONG TERM GOAL #3   Title improve timed up and go to < 16 sec with LRAD for imroved mobility    Baseline 12/11/16: 15.13 sec's with no AD   Status Achieved     PT LONG TERM GOAL #4   Title improve gait velocity to > 2.5 ft/sec for improved mobility     Baseline 12/11/16: 2.19 ft/sec with no AD. (improved from 1.76 ft/sec at last check)   Status Partially Met     PT LONG TERM GOAL #5   Title amb > 500' without AD at mod I level on various indoor surfaces for improved household ambulation.    Status Achieved     PT LONG TERM GOAL #6   Title Pt will ambulate 1000' with LRAD at mod I level in order to indicate safe return to community.    Status Achieved               Plan - 12/15/16 1103    Clinical Impression Statement Pt has met most goals and improved on goals that weren't completely met.  At this time pt is ready for d/c from PT.  Reinforced need for continued community exercise and compliance with HEP.   PT Treatment/Interventions ADLs/Self Care Home Management;Cryotherapy;Moist Heat;Electrical Stimulation;Cognitive remediation;Neuromuscular re-education;Balance training;Therapeutic exercise;Therapeutic activities;Functional mobility training;Stair training;Gait training;DME Instruction;Patient/family education;Vestibular   PT Next Visit Plan d/c PT today   Consulted and Agree with Plan of Care Patient      Patient will benefit from skilled therapeutic intervention in order to improve the following deficits and impairments:  Abnormal gait, Decreased balance, Decreased coordination, Decreased mobility, Difficulty walking, Decreased strength, Decreased knowledge of use of DME, Impaired sensation, Decreased safety awareness, Decreased cognition, Decreased endurance  Visit Diagnosis: Hemiplegia and hemiparesis following cerebral infarction affecting right dominant side (HCC)  Muscle weakness (generalized)  Repeated falls     Problem List Patient Active Problem List   Diagnosis Date Noted  . Right thigh pain 09/10/2016  . Neck muscle strain 09/10/2016  . Healthcare maintenance 09/10/2016  . Neck pain on left side 06/19/2016  . Essential hypertension, benign 05/27/2016  . Medical non-compliance   . Prediabetes   . RUE  weakness   . Intracranial vascular stenosis   . Stroke (HCC) 05/16/2016  . Cerebrovascular accident (CVA) (HCC)   . Essential hypertension   . Obesity   . Abnormal EKG   . Hypertension 09/17/2012  . FATIGUE 01/06/2010  . CONSTIPATION 03/22/2009  . HELICOBACTER PYLORI INFECTION, HX OF 01/28/2009  . ALLERGIC RHINITIS 03/02/2008  . ABDOMINAL PAIN 03/02/2008  . Hyperlipemia 07/18/2007       Clarita Crane, PT, DPT 12/15/16 11:13 AM    [facility] [phone]  [place], [place], [address] Phone: [phone]   Fax:  [phone]  Name: Kathleen Larson MRN: [id number] Date of Birth: October 12, 1966         PHYSICAL THERAPY DISCHARGE SUMMARY  Visits from Start of Care: 8  Current functional level related to goals / functional outcomes: See above   Remaining deficits: See above; continues to have some residual R sided weakness and recommend cane for outdoors and greater distances   Education / Equipment: HEP, continued community exercise  Plan: Patient agrees to discharge.  Patient goals were partially met. Patient is being discharged due to being pleased with the current functional level.  ?????     Laureen Abrahams, PT, DPT 12/15/16 11:14 AM  [facility] [address] [place], [place] [address]  [phone] (office) [phone] (fax)

## 2016-12-15 NOTE — Therapy (Signed)
[facility] [address] [place], [place], [address] Phone: [phone]   Fax:  [phone]  Occupational Therapy Treatment  Patient Details  Name: Kathleen Larson MRN: [id number] Date of Birth: 10/08/1966 Referring Provider: Dr. Quillian Quince L. Rosalyn Gess  Encounter Date: 12/15/2016      OT End of Session - 12/15/16 1258    Visit Number 8   Number of Visits 9   Date for OT Re-Evaluation 12/15/16   Authorization Type MCD approved 8 visits from 10/21/16 - 12/15/16   Authorization - Visit Number 7   Authorization - Number of Visits 8   OT Start Time 0933  2 units, pt in BR   OT Stop Time 1015   OT Time Calculation (min) 42 min      Past Medical History:  Diagnosis Date  . Allergy   . GERD (gastroesophageal reflux disease)   . Hypertension     Past Surgical History:  Procedure Laterality Date  . TUBAL LIGATION      There were no vitals filed for this visit.      Subjective Assessment - 12/15/16 1011    Pertinent History See EPIC; HTN; Falls ("~4x since stroke"); Aphasia   Patient Stated Goals "Get stronger, get back to normal"                 Arm bike x 6 mins level 3 for conditioning              OT Education - 12/15/16 1257    Education provided Yes   Education Details HEP(theraband yellow, red theraputty, ball ex), and no cooking without supervision, prec. related to sensory impairment   Person(s) Educated Patient;Other (comment)   Methods Explanation;Demonstration;Verbal cues;Handout   Comprehension Verbalized understanding;Returned demonstration;Verbal cues required          OT Short Term Goals - 12/15/16 1014      OT SHORT TERM GOAL #1   Title Pt will be Mod I signs and symptoms of CVA    Baseline Dependent   Time 4   Period Weeks   Status Achieved     OT SHORT TERM GOAL #2   Title Pt will be Mod I HEP for RUE FM/coordination   Baseline Dependent    Time 4   Period  Weeks   Status Achieved     OT SHORT TERM GOAL #3   Title Pt will be Mod I HEP for RUE proximal strengthening and grip as seen by improved grip strength by 5# or more R hand   Baseline Dependent/See Eval 10/07/16 (baseline 21#)   Time 4   Period Weeks   Status Achieved  30 #     OT SHORT TERM GOAL #4   Title Pt will state 2-3 memory strategies given Min A/Vc's   Baseline 10/07/16 Unable   Time 4   Period Weeks   Status Achieved  Pt verbalized understanding of 2 strategies           OT Long Term Goals - 12/15/16 1015      OT LONG TERM GOAL #1   Title Pt will demonstrate improved coordination as seen by improved 9 hole peg score by 5 seconds or more RUE   Baseline See Eval 10/07/16   Time 8   Period Weeks   Status Achieved  32.15 secs     OT LONG TERM GOAL #2   Title Pt will be Mod I LB ADL's (tying shoes and fastening  buttons/snaps).   Baseline Supervision-Min A 10/07/16   Time 8   Period Weeks   Status Achieved     OT LONG TERM GOAL #3   Title Pt will be Mod I simple snack or meal prep while maintaining safety and balance in ADL kitchen   Baseline 10/07/16 Family preparing meals   Time 8   Period Weeks   Status Not Met  maintained balance, forgot to turn off stove     OT LONG TERM GOAL #4   Title Pt will independently state 2-3 memory strategies & implement during ADL's   Baseline 10/07/16 Supervision-min A   Time 8   Period Weeks   Status Achieved     OT LONG TERM GOAL #5   Title Pt will demonstrate improved RUE strength as seen by MMT of 4/5 throughout   Baseline 10/07/16 3-/5 RUE   Time 8   Period Weeks   Status Partially Met  4-/5 proximal, 4/5 distal               Plan - 12/15/16 1012    Clinical Impression Statement Pt demonstrates good overall progress towards goals. She agrees with plans for d/c today.   Rehab Potential Good   Clinical Impairments Affecting Rehab Potential Decreased awareness of deficits; cognition   OT Frequency 2x  / week   OT Duration 8 weeks   OT Treatment/Interventions Self-care/ADL training;DME and/or AE instruction;Splinting;Patient/family education;Balance training;Therapeutic exercises;Therapeutic exercise;Therapeutic activities;Cognitive remediation/compensation;Passive range of motion;Neuromuscular education;Functional Mobility Training;Energy conservation;Manual Therapy;Visual/perceptual remediation/compensation;Electrical Stimulation   Plan discharge OT   Consulted and Agree with Plan of Care Patient      Patient will benefit from skilled therapeutic intervention in order to improve the following deficits and impairments:  Decreased coordination, Decreased range of motion, Decreased endurance, Decreased safety awareness, Impaired sensation, Decreased knowledge of precautions, Decreased activity tolerance, Decreased balance, Decreased knowledge of use of DME, Impaired UE functional use, Pain, Impaired vision/preception, Impaired perceived functional ability, Decreased strength, Decreased cognition, Decreased mobility  Visit Diagnosis: Other lack of coordination  Muscle weakness (generalized)  Other abnormalities of gait and mobility  Hemiplegia and hemiparesis following cerebral infarction affecting right dominant side (HCC)  Other symptoms and signs involving cognitive functions following other cerebrovascular disease   OCCUPATIONAL THERAPY DISCHARGE SUMMARY  Current functional level related to goals / functional outcomes: Pt made good overall progress, however she did not fully meet all goals due to limited visits   Remaining deficits: Cognitive deficits, decreased strength   Education / Equipment: Pt was educated regarding : HEP, safety due to sensory impairment , and need for supervision with cooking tasks for safety. Pt verbalized understanding of all education. Plan: Patient agrees to discharge.  Patient goals were partially met. Patient is being discharged due to being pleased  with the current functional level.  ?????      Problem List Patient Active Problem List   Diagnosis Date Noted  . Right thigh pain 09/10/2016  . Neck muscle strain 09/10/2016  . Healthcare maintenance 09/10/2016  . Neck pain on left side 06/19/2016  . Essential hypertension, benign 05/27/2016  . Medical non-compliance   . Prediabetes   . RUE weakness   . Intracranial vascular stenosis   . Stroke ([facility]) 05/16/2016  . Cerebrovascular accident (CVA) ([facility])   . Essential hypertension   . Obesity   . Abnormal EKG   . Hypertension 09/17/2012  . FATIGUE 01/06/2010  . CONSTIPATION 03/22/2009  . HELICOBACTER PYLORI INFECTION, HX OF 01/28/2009  .  ALLERGIC RHINITIS 03/02/2008  . ABDOMINAL PAIN 03/02/2008  . Hyperlipemia 07/18/2007    RINE,KATHRYN 12/15/2016, 12:59 PM  [facility] [address] [place], [place], [address] Phone: [phone]   Fax:  [phone]  Name: Kathleen Larson MRN: [id number] Date of Birth: 03-21-1966

## 2016-12-18 ENCOUNTER — Encounter: Payer: Medicaid Other | Admitting: Occupational Therapy

## 2016-12-18 ENCOUNTER — Ambulatory Visit: Payer: Medicaid Other | Admitting: Physical Therapy

## 2016-12-24 ENCOUNTER — Ambulatory Visit (INDEPENDENT_AMBULATORY_CARE_PROVIDER_SITE_OTHER): Payer: Medicaid Other | Admitting: Family Medicine

## 2016-12-24 VITALS — BP 130/94 | HR 91 | Temp 97.5°F | Ht 65.0 in | Wt 236.0 lb

## 2016-12-24 DIAGNOSIS — R87612 Low grade squamous intraepithelial lesion on cytologic smear of cervix (LGSIL): Secondary | ICD-10-CM

## 2016-12-26 NOTE — Progress Notes (Signed)
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  Placed in lithotomy position. Cervix viewed with speculum and colposcope after application of acetic acid.   Colposcopy adequate (entire squamocolumnar junctions seen  in entirety) ?  yes Acetowhite lesions?none Punctation?no Mosaicism?  no Abnormal vasculature?  no Biopsies?no ECC?no Complications? no  COMMENTS: Patient was given post procedure instructions. Recommend repeat pap cotest 1 year

## 2017-03-09 ENCOUNTER — Ambulatory Visit (INDEPENDENT_AMBULATORY_CARE_PROVIDER_SITE_OTHER): Payer: Medicaid Other | Admitting: Family Medicine

## 2017-03-09 ENCOUNTER — Encounter: Payer: Self-pay | Admitting: Family Medicine

## 2017-03-09 VITALS — BP 130/88 | HR 80 | Temp 98.0°F | Ht 65.0 in | Wt 233.8 lb

## 2017-03-09 DIAGNOSIS — R7303 Prediabetes: Secondary | ICD-10-CM | POA: Diagnosis not present

## 2017-03-09 DIAGNOSIS — M7061 Trochanteric bursitis, right hip: Secondary | ICD-10-CM | POA: Diagnosis not present

## 2017-03-09 LAB — POCT GLYCOSYLATED HEMOGLOBIN (HGB A1C): Hemoglobin A1C: 8.9

## 2017-03-09 MED ORDER — TRAMADOL HCL 50 MG PO TABS: 50.0000 mg | ORAL_TABLET | Freq: Four times a day (QID) | ORAL | 0 refills | Status: DC | PRN

## 2017-03-09 NOTE — Patient Instructions (Signed)
Please return if you would like an injection into your bursa.    Hip Bursitis Hip bursitis is inflammation of a fluid-filled sac (bursa) in the hip joint. The bursa protects the bones in the hip joint from rubbing against each other. Hip bursitis can cause mild to moderate pain, and symptoms often come and go over time. What are the causes? This condition may be caused by:  Injury to the hip.  Overuse of the muscles that surround the hip joint.  Arthritis or gout.  Diabetes.  Thyroid disease.  Cold weather.  Infection. In some cases, the cause may not be known. What are the signs or symptoms? Symptoms of this condition may include:  Mild or moderate pain in the hip area. Pain may get worse with movement.  Tenderness and swelling of the hip, especially on the outer side of the hip. Symptoms may come and go. If the bursa becomes infected, you may have the following symptoms:  Fever.  Red skin and a feeling of warmth in the hip area. How is this diagnosed? This condition may be diagnosed based on:  A physical exam.  Your medical history.  X-rays.  Removal of fluid from your inflamed bursa for testing (biopsy). You may be sent to a health care provider who specializes in bone diseases (orthopedist) or a provider who specializes in joint inflammation (rheumatologist). How is this treated? This condition is treated by resting, raising (elevating), and applying pressure(compression) to the injured area. In some cases, this may be enough to make your symptoms go away. Treatment may also include:  Crutches.  Antibiotic medicine.  Draining fluid out of the bursa to help relieve swelling.  Injecting medicine that helps to reduce inflammation (cortisone). Follow these instructions at home: Medicines   Take over-the-counter and prescription medicines only as told by your health care provider.  Do not drive or operate heavy machinery while taking prescription pain  medicine, or as told by your health care provider.  If you were prescribed an antibiotic, take it as told by your health care provider. Do not stop taking the antibiotic even if you start to feel better. Activity   Return to your normal activities as told by your health care provider. Ask your health care provider what activities are safe for you.  Rest and protect your hip as much as possible until your pain and swelling get better. General instructions   Wear compression wraps only as told by your health care provider.  Elevate your hip above the level of your heart as much as you can without pain. To do this, try putting a pillow under your hips while you lie down.  Do not use your hip to support your body weight until your health care provider says that you can. Use crutches as told by your health care provider.  Gently massage and stretch your injured area as often as is comfortable.  Keep all follow-up visits as told by your health care provider. This is important. How is this prevented?  Exercise regularly, as told by your health care provider.  Warm up and stretch before being active.  Cool down and stretch after being active.  If an activity irritates your hip or causes pain, avoid the activity as much as possible.  Avoid sitting down for long periods at a time. Contact a health care provider if:  You have a fever.  You develop new symptoms.  You have difficulty walking or doing everyday activities.  You have pain  that gets worse or does not get better with medicine.  You develop red skin or a feeling of warmth in your hip area. Get help right away if:  You cannot move your hip.  You have severe pain. This information is not intended to replace advice given to you by your health care provider. Make sure you discuss any questions you have with your health care provider. Document Released: 04/17/2002 Document Revised: 04/02/2016 Document Reviewed:  05/28/2015 Elsevier Interactive Patient Education  2017 [employer].

## 2017-03-10 NOTE — Progress Notes (Signed)
Subjective:     Patient ID: Kathleen Larson, female   DOB: 06/21/1966, 51 y.o.   MRN: [id number]  HPI Kathleen Larson is a 51yo female presenting today for lateral hip pain. Notes pain in right hip since last Thursday 03/02/17. Symptoms started shortly following a fall. States she was leaning forward to pick up a picture and then fell onto her left side (note pain is on right). Initially pain was intermittent, worsened with activity and sleeping directly on it, but now pain is constant. Pain is located over lateral hip. Denies groin pain or piriformis pain. Ambulating. Has been u sing Tylenol, which is not helping.  Never Smoker   Review of Systems Per HPI    Objective:   Physical Exam  Constitutional: She appears well-developed and well-nourished. No distress.  HENT:  Head: Normocephalic and atraumatic.  Cardiovascular: Normal rate and regular rhythm.   No murmur heard. Pulmonary/Chest: Effort normal. No respiratory distress. She has no wheezes.  Musculoskeletal:  ROM of hips symmetric and no pain throughout ROM. Muscle strength 5/5 in upper and lower extremities. Tenderness over greater trochanter on right. No piriformis tenderness noted.       Assessment and Plan:     1. Trochanteric bursitis, right hip Low suspicion for hip fracture given no pain with ROM and pain initially starting out as intermittent. Recommended injection, but patient refuses and instead prefers to try conservative treatments. 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 Tramadol prescribed. Home exercises given. If no improvement, consider injection and/or referral to physical therapy. Return if no improvement.

## 2017-03-25 ENCOUNTER — Encounter: Payer: Self-pay | Admitting: Nurse Practitioner

## 2017-03-25 ENCOUNTER — Ambulatory Visit (INDEPENDENT_AMBULATORY_CARE_PROVIDER_SITE_OTHER): Payer: Medicaid Other | Admitting: Nurse Practitioner

## 2017-03-25 VITALS — BP 138/86 | HR 76 | Ht 65.0 in | Wt 232.6 lb

## 2017-03-25 DIAGNOSIS — I639 Cerebral infarction, unspecified: Secondary | ICD-10-CM | POA: Diagnosis not present

## 2017-03-25 DIAGNOSIS — E785 Hyperlipidemia, unspecified: Secondary | ICD-10-CM

## 2017-03-25 DIAGNOSIS — R269 Unspecified abnormalities of gait and mobility: Secondary | ICD-10-CM

## 2017-03-25 DIAGNOSIS — I10 Essential (primary) hypertension: Secondary | ICD-10-CM

## 2017-03-25 NOTE — Progress Notes (Signed)
I agree with the assessment and plan as directed by NP  , WID signature.    Frenchie Pribyl, MD

## 2017-03-25 NOTE — Progress Notes (Signed)
[facility]  PATIENT: Kathleen Larson DOB: January 31, 1966   REASON FOR VISIT: follow-up for stroke HISTORY FROM: Patient and sister Kathleen Larson    HISTORY OF PRESENT ILLNESS:UPDATE 5/17 /2018 CM Kathleen Larson, 51 year old female returns for follow-up with a history of in July of last year.she remains on Plavix and aspirin for secondary    stroke prevention without further stroke or TIA symptoms. She has no bruising and no bleeding. Her therapies have concluded. She remains on Lipitor without myalgias. She is ambulating with a single-point cane, occasional fall does not always use her cane.. Blood pressure 138/86. She is asking about a diet for her stroke.She returns for reevaluation  UPDATE 11/17/2017CM Kathleen Larson, 51 year old female returns for follow-up. She had an admission to the hospital in July for white right upper extremity weakness. 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 She returns today for follow-up. Blood pressure is 140/82 she remains on Norvasc and lisinopril. In addition she is on aspirin 325 and Plavix for secondary stroke prevention without further stroke or TIA symptoms. She remains on Lipitor without complaints of myalgias. She claims she has had a few falls and she does not always use her cane. She is currently getting some speech therapy. She claims she is doing her home exercise program. She returns for reevaluation    HISTORY 06/30/16 CMConstance M Bascom Larson is an 51 y.o. female with a history of hypertension who developed right hand numbness the day prior to admission while getting ready for work at approximately 5:30 in the morning. The patient states that she did not awake up with the symptom. She decided to go on to work and her deficits progressed throughout the day. On 05/16/16  she did not feel well and presented to the emergency department for further evaluation. The patient did not describe weakness although there is RUE weakness on exam.  Her deficits are confined to the right upper extremity.  A CT scan of the head revealed no evidence of acute intracranial abnormality. Focal hypodensity in the right periventricular white matter overlying the right frontal horn, as well as within the left basal ganglia. MRI HEAD Left PLIC infarct likely secondary to small vessel disease Multiple remote lacunar infarcts involving the bilateral corona radiata, right basal ganglia, left thalamus, and right pons.  MRA HEAD extensive intracranial atherosclerosis. Carotid Doppler no significant stenosis. 2-D echo no cardiac source of embolism  LDL 195 Hemoglobin A1c 5.9 No anti-thrombotic prior to admission now on aspirin and Plavix for 3 months The patient reported that she did not take her blood pressure medication this a.m. but denied missing any other doses. In the emergency department her blood pressure was significantly elevated and she was placed on a Cleviprex drip. Her last blood pressure was 193/122. She also had sinus tachycardia with rates in the 120 to 130 range. She denied chest pain or shortness of breath. Family members who accompanied the patient reported that her speech did not sound normal but no obvious slurring was detected on exam.She stated that she was on HTN meds before but currently not on it. She denies smoking. She was discharged with physical therapy She returns today for follow-up. Her therapies concluded last week and she is doing her home exercise program , she claims she is compliant with her medications she returns for reevaluation . She has not had further stroke or TIA symptoms   REVIEW OF SYSTEMS: Full 14 system review of systems performed and notable only for those  listed, all others are neg:  Constitutional: neg  Cardiovascular: neg Ear/Nose/Throat: neg  Skin: neg Eyesneg Respiratory: neg Gastroitestinal: neg  Hematology/Lymphatic: neg  Endocrine: neg Musculoskeletal: Walking difficulty Allergy/Immunology:  neg Neurological: neg Psychiatric: Anxiety Sleep :neg  ALLERGIES: Allergies  Allergen Reactions  . Shrimp [Shellfish Allergy] Swelling    Of lips    HOME MEDICATIONS: Outpatient Medications Prior to Visit  Medication Sig Dispense Refill  . amLODipine (NORVASC) 10 MG tablet Take 1 tablet (10 mg total) by mouth daily. 90 tablet 1  . aspirin 325 MG tablet Take 325 mg by mouth daily.    Marland Kitchen atorvastatin (LIPITOR) 80 MG tablet TAKE ONE TABLET BY MOUTH ONCE DAILY AT 6:00 PM 30 tablet 2  . clopidogrel (PLAVIX) 75 MG tablet Take 1 tablet (75 mg total) by mouth daily. 90 tablet 1  . lisinopril-hydrochlorothiazide (ZESTORETIC) 20-12.5 MG tablet Take 2 tablets by mouth daily. 60 tablet 2  . traMADol (ULTRAM) 50 MG tablet Take 1 tablet (50 mg total) by mouth every 6 (six) hours as needed. 20 tablet 0   No facility-administered medications prior to visit.     PAST MEDICAL HISTORY: Past Medical History:  Diagnosis Date  . Allergy   . GERD (gastroesophageal reflux disease)   . Hypertension     PAST SURGICAL HISTORY: Past Surgical History:  Procedure Laterality Date  . TUBAL LIGATION      FAMILY HISTORY: Family History  Problem Relation Age of Onset  . Stroke Mother        37 yo  . Asthma Daughter   . Cancer Maternal Grandmother     SOCIAL HISTORY: Social History   Social History  . Marital status: Single    Spouse name: N/A  . Number of children: N/A  . Years of education: N/A   Occupational History  . [employer]    Social History Main Topics  . Smoking status: Never Smoker  . Smokeless tobacco: Never Used  . Alcohol use Yes  . Drug use: No  . Sexual activity: Not on file   Other Topics Concern  . Not on file   Social History Narrative  . No narrative on file     PHYSICAL EXAM  Vitals:   03/25/17 1333  BP: 138/86  Pulse: 76  Weight: 232 lb 9.6 oz (105.5 kg)  Height: 5\' 5"  (1.651 m)   Body mass index is 38.71 kg/m.  Generalized: Well  developed, obese female in no acute distress  Head: normocephalic and atraumatic,. Oropharynx benign  Neck: Supple, no carotid bruits  Cardiac: Regular rate rhythm, no murmur  Musculoskeletal: No deformity   Neurological examination   Mentation: Alert oriented to time, place, history taking. Attention span and concentration appropriate. Recent and remote memory intact.  Follows all commands speech With disfluency and hesitancy .   Cranial nerve II-XII: .Pupils were equal round reactive to light extraocular movements were full, visual field were full on confrontational test. Mild right nasolabial fold flattening.Hearing was intact to finger rubbing bilaterally. Uvula tongue midline. head turning and shoulder shrug were normal and symmetric.Tongue protrusion into cheek strength was normal. Motor: normal bulk and tone, full strength in the BUE, BLE,  except right upper extremity 4 out of 5 and mild pronator drift present on the right  Sensory: normal and symmetric to light touch, pinprick, and  Vibration,  mildly decreased on the right  Coordination: finger-nose-ataxia with the right hand Reflexes: 1+ upper lower and symmetric plantar responses were flexor bilaterally. Gait  and Station: Rising up from seated position without assistance,  wide based stance,  moderate stride,  smooth turning, able to perform tiptoe, and heel walking without difficulty. Tandem gait mildly unsteady ambulates with single-point cane  DIAGNOSTIC DATA (LABS, IMAGING, TESTING) - I reviewed patient records, labs, notes, testing and imaging myself where available.  Lab Results  Component Value Date   WBC 5.9 05/21/2016   HGB 15.0 05/21/2016   HCT 46.9 (H) 05/21/2016   MCV 81.6 05/21/2016   PLT 257 05/21/2016      Component Value Date/Time   NA 141 06/11/2016 1620   K 3.8 06/11/2016 1620   CL 100 06/11/2016 1620   CO2 27 06/11/2016 1620   GLUCOSE 117 (H) 06/11/2016 1620   BUN 16 06/11/2016 1620   CREATININE 1.01  06/11/2016 1620   CALCIUM 9.6 06/11/2016 1620   PROT 7.4 05/21/2016 1349   ALBUMIN 3.7 05/21/2016 1349   AST 24 05/21/2016 1349   ALT 21 05/21/2016 1349   ALKPHOS 93 05/21/2016 1349   BILITOT 1.3 (H) 05/21/2016 1349   GFRNONAA 64 06/11/2016 1620   GFRAA 74 06/11/2016 1620   Lab Results  Component Value Date   CHOL 277 (H) 05/16/2016   HDL 59 05/16/2016   LDLCALC 195 (H) 05/16/2016   TRIG 115 05/16/2016   CHOLHDL 4.7 05/16/2016   Lab Results  Component Value Date   HGBA1C 8.9 03/09/2017   No results found for: [id number] Lab Results  Component Value Date   TSH 1.381 05/16/2016      ASSESSMENT AND PLAN  51 y.o. 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 The patient is a current patient of Dr.Xu  who is out of the office today . This note is sent to the work in doctor.     PLAN:management of stroke risk factors Continue aspirin and Plavix for secondary stroke prevention Maintain strict control of hypertension with blood pressure goal below 130/90, today's reading 138/86 continue antihypertensive medications Cholesterol with LDL cholesterol less than 70, followed by primary care, continue Lipitor HEP to gradually improve strength, eat healthy diet with whole grains,  fresh fruits and vegetables Mediterranean diet recommended Will discharge from stroke clinic I spent in total face to face time with the patient more than 50% of which was spent counseling and coordination of care, reviewing test results reviewing medications and discussing and reviewing the diagnosis of stroke, management of risk factors by PCP Dr. Myrtie Soman and Mediterranean diet. , Cline Crock, [facility], APRN  [facility] [address] [place], [place] [address] [phone]

## 2017-03-25 NOTE — Patient Instructions (Addendum)
Continue aspirin and Plavix for secondary stroke prevention Maintain strict control of hypertension with blood pressure goal below 130/90, today's reading 138/86 continue antihypertensive medications Cholesterol with LDL cholesterol less than 70, followed by primary care, continue Lipitor HEP to gradually improve strength, eat healthy diet with whole grains,  fresh fruits and vegetables Mediterranean diet Will discharge from stroke clinic  Stroke Prevention Some health problems and behaviors may make it more likely for you to have a stroke. Below are ways to lessen your risk of having a stroke.  Be active for at least 30 minutes on most or all days.  Do not smoke. Try not to be around others who smoke.  Do not drink too much alcohol.  Do not have more than 2 drinks a day if you are a man.  Do not have more than 1 drink a day if you are a woman and are not pregnant.  Eat healthy foods, such as fruits and vegetables. If you were put on a specific diet, follow the diet as told.  Keep your cholesterol levels under control through diet and medicines. Look for foods that are low in saturated fat, trans fat, cholesterol, and are high in fiber.  If you have diabetes, follow all diet plans and take your medicine as told.  Ask your doctor if you need treatment to lower your blood pressure. If you have high blood pressure (hypertension), follow all diet plans and take your medicine as told by your doctor.  If you are 70-51 years old, have your blood pressure checked every 3-5 years. If you are age 62 or older, have your blood pressure checked every year.  Keep a healthy weight. Eat foods that are low in calories, salt, saturated fat, trans fat, and cholesterol.  Do not take drugs.  Avoid birth control pills, if this applies. Talk to your doctor about the risks of taking birth control pills.  Talk to your doctor if you have sleep problems (sleep apnea).  Take all medicine as told by your  doctor.  You may be told to take aspirin or blood thinner medicine. Take this medicine as told by your doctor.  Understand your medicine instructions.  Make sure any other conditions you have are being taken care of. Get help right away if:  You suddenly lose feeling (you feel numb) or have weakness in your face, arm, or leg.  Your face or eyelid hangs down to one side.  You suddenly feel confused.  You have trouble talking (aphasia) or understanding what people are saying.  You suddenly have trouble seeing in one or both eyes.  You suddenly have trouble walking.  You are dizzy.  You lose your balance or your movements are clumsy (uncoordinated).  You suddenly have a very bad headache and you do not know the cause.  You have new chest pain.  Your heart feels like it is fluttering or skipping a beat (irregular heartbeat). Do not wait to see if the symptoms above go away. Get help right away. Call your local emergency services (911 in [place] Do not drive yourself to the hospital. This information is not intended to replace advice given to you by your health care provider. Make sure you discuss any questions you have with your health care provider. Document Released: 04/26/2012 Document Revised: 04/02/2016 Document Reviewed: 04/28/2013 Elsevier Interactive Patient Education  2017 [employer].  Mediterranean Diet A Mediterranean diet refers to food and lifestyle choices that are based on the traditions of countries  located on the [place]. This way of eating has been shown to help prevent certain conditions and improve outcomes for people who have chronic diseases, like kidney disease and heart disease. What are tips for following this plan? Lifestyle   Cook and eat meals together with your family, when possible.  Drink enough fluid to keep your urine clear or pale yellow.  Be physically active every day. This includes:  Aerobic exercise like running or  swimming.  Leisure activities like gardening, walking, or housework.  Get 7-8 hours of sleep each night.  If recommended by your health care provider, drink red wine in moderation. This means 1 glass a day for nonpregnant women and 2 glasses a day for men. A glass of wine equals 5 oz (150 mL). Reading food labels   Check the serving size of packaged foods. For foods such as rice and pasta, the serving size refers to the amount of cooked product, not dry.  Check the total fat in packaged foods. Avoid foods that have saturated fat or trans fats.  Check the ingredients list for added sugars, such as corn syrup. Shopping   At the grocery store, buy most of your food from the areas near the walls of the store. This includes:  Fresh fruits and vegetables (produce).  Grains, beans, nuts, and seeds. Some of these may be available in unpackaged forms or large amounts (in bulk).  Fresh seafood.  Poultry and eggs.  Low-fat dairy products.  Buy whole ingredients instead of prepackaged foods.  Buy fresh fruits and vegetables in-season from local farmers markets.  Buy frozen fruits and vegetables in resealable bags.  If you do not have access to quality fresh seafood, buy precooked frozen shrimp or canned fish, such as tuna, salmon, or sardines.  Buy small amounts of raw or cooked vegetables, salads, or olives from the deli or salad bar at your store.  Stock your pantry so you always have certain foods on hand, such as olive oil, canned tuna, canned tomatoes, rice, pasta, and beans. Cooking   Cook foods with extra-virgin olive oil instead of using butter or other vegetable oils.  Have meat as a side dish, and have vegetables or grains as your main dish. This means having meat in small portions or adding small amounts of meat to foods like pasta or stew.  Use beans or vegetables instead of meat in common dishes like chili or lasagna.  Experiment with different cooking methods. Try  roasting or broiling vegetables instead of steaming or sauteing them.  Add frozen vegetables to soups, stews, pasta, or rice.  Add nuts or seeds for added healthy fat at each meal. You can add these to yogurt, salads, or vegetable dishes.  Marinate fish or vegetables using olive oil, lemon juice, garlic, and fresh herbs. Meal planning   Plan to eat 1 vegetarian meal one day each week. Try to work up to 2 vegetarian meals, if possible.  Eat seafood 2 or more times a week.  Have healthy snacks readily available, such as:  Vegetable sticks with hummus.  Greek yogurt.  Fruit and nut trail mix.  Eat balanced meals throughout the week. This includes:  Fruit: 2-3 servings a day  Vegetables: 4-5 servings a day  Low-fat dairy: 2 servings a day  Fish, poultry, or lean meat: 1 serving a day  Beans and legumes: 2 or more servings a week  Nuts and seeds: 1-2 servings a day  Whole grains: 6-8 servings a day  Extra-virgin olive oil: 3-4 servings a day  Limit red meat and sweets to only a few servings a month What are my food choices?  Mediterranean diet  Recommended  Grains: Whole-grain pasta. Brown rice. Bulgar wheat. Polenta. Couscous. Whole-wheat bread. Orpah Cobbatmeal. Quinoa.  Vegetables: Artichokes. Beets. Broccoli. Cabbage. Carrots. Eggplant. Green beans. Chard. Kale. Spinach. Onions. Leeks. Peas. Squash. Tomatoes. Peppers. Radishes.  Fruits: Apples. Apricots. Avocado. Berries. Bananas. Cherries. Dates. Figs. Grapes. Lemons. Melon. Oranges. Peaches. Plums. Pomegranate.  Meats and other protein foods: Beans. Almonds. Sunflower seeds. Pine nuts. Peanuts. Cod. Salmon. Scallops. Shrimp. Tuna. Tilapia. Clams. Oysters. Eggs.  Dairy: Low-fat milk. Cheese. Greek yogurt.  Beverages: Water. Red wine. Herbal tea.  Fats and oils: Extra virgin olive oil. Avocado oil. Grape seed oil.  Sweets and desserts: [place] yogurt with honey. Baked apples. Poached pears. Trail mix.  Seasoning and  other foods: Basil. Cilantro. Coriander. Cumin. Mint. Parsley. Sage. Rosemary. Tarragon. Garlic. Oregano. Thyme. Pepper. Balsalmic vinegar. Tahini. Hummus. Tomato sauce. Olives. Mushrooms.  Limit these  Grains: Prepackaged pasta or rice dishes. Prepackaged cereal with added sugar.  Vegetables: Deep fried potatoes (french fries).  Fruits: Fruit canned in syrup.  Meats and other protein foods: Beef. Pork. Lamb. Poultry with skin. Hot dogs. Tomasa BlaseBacon.  Dairy: Ice cream. Sour cream. Whole milk.  Beverages: Juice. Sugar-sweetened soft drinks. Beer. Liquor and spirits.  Fats and oils: Butter. Canola oil. Vegetable oil. Beef fat (tallow). Lard.  Sweets and desserts: Cookies. Cakes. Pies. Candy.  Seasoning and other foods: Mayonnaise. Premade sauces and marinades.  The items listed may not be a complete list. Talk with your dietitian about what dietary choices are right for you. Summary  The Mediterranean diet includes both food and lifestyle choices.  Eat a variety of fresh fruits and vegetables, beans, nuts, seeds, and whole grains.  Limit the amount of red meat and sweets that you eat.  Talk with your health care provider about whether it is safe for you to drink red wine in moderation. This means 1 glass a day for nonpregnant women and 2 glasses a day for men. A glass of wine equals 5 oz (150 mL). This information is not intended to replace advice given to you by your health care provider. Make sure you discuss any questions you have with your health care provider. Document Released: 06/18/2016 Document Revised: 07/21/2016 Document Reviewed: 06/18/2016 Elsevier Interactive Patient Education  2017 [employer]

## 2017-03-27 ENCOUNTER — Other Ambulatory Visit: Payer: Self-pay | Admitting: Family Medicine

## 2017-03-27 DIAGNOSIS — I10 Essential (primary) hypertension: Secondary | ICD-10-CM

## 2017-03-29 ENCOUNTER — Other Ambulatory Visit: Payer: Self-pay | Admitting: Family Medicine

## 2017-03-29 DIAGNOSIS — I10 Essential (primary) hypertension: Secondary | ICD-10-CM

## 2017-05-26 ENCOUNTER — Ambulatory Visit (INDEPENDENT_AMBULATORY_CARE_PROVIDER_SITE_OTHER): Payer: BLUE CROSS/BLUE SHIELD | Admitting: Family Medicine

## 2017-05-26 ENCOUNTER — Encounter: Payer: Self-pay | Admitting: Family Medicine

## 2017-05-26 VITALS — BP 128/88 | HR 76 | Temp 98.4°F | Ht 65.0 in | Wt 231.0 lb

## 2017-05-26 DIAGNOSIS — R7303 Prediabetes: Secondary | ICD-10-CM

## 2017-05-26 DIAGNOSIS — M7061 Trochanteric bursitis, right hip: Secondary | ICD-10-CM | POA: Diagnosis not present

## 2017-05-26 DIAGNOSIS — I63519 Cerebral infarction due to unspecified occlusion or stenosis of unspecified middle cerebral artery: Secondary | ICD-10-CM

## 2017-05-26 DIAGNOSIS — I10 Essential (primary) hypertension: Secondary | ICD-10-CM

## 2017-05-26 DIAGNOSIS — E875 Hyperkalemia: Secondary | ICD-10-CM | POA: Diagnosis not present

## 2017-05-26 HISTORY — DX: Trochanteric bursitis, right hip: M70.61

## 2017-05-26 LAB — GLUCOSE, POCT (MANUAL RESULT ENTRY): POC Glucose: 186 mg/dl — AB (ref 70–99)

## 2017-05-26 MED ORDER — ASPIRIN 325 MG PO TABS: 325.0000 mg | ORAL_TABLET | Freq: Every day | ORAL | 8 refills | Status: DC

## 2017-05-26 MED ORDER — ATORVASTATIN CALCIUM 80 MG PO TABS: ORAL_TABLET | ORAL | 2 refills | Status: DC

## 2017-05-26 MED ORDER — TRAMADOL HCL 50 MG PO TABS: 50.0000 mg | ORAL_TABLET | Freq: Four times a day (QID) | ORAL | 0 refills | Status: DC | PRN

## 2017-05-26 MED ORDER — CLOPIDOGREL BISULFATE 75 MG PO TABS: 75.0000 mg | ORAL_TABLET | Freq: Every day | ORAL | 1 refills | Status: DC

## 2017-05-26 MED ORDER — METHYLPREDNISOLONE ACETATE 40 MG/ML IJ SUSP
40.0000 mg | Freq: Once | INTRAMUSCULAR | Status: AC
  Administered 2017-05-26: 40 mg via INTRAMUSCULAR

## 2017-05-26 MED ORDER — AMLODIPINE BESYLATE 10 MG PO TABS: 10.0000 mg | ORAL_TABLET | Freq: Every day | ORAL | 1 refills | Status: DC

## 2017-05-26 NOTE — Assessment & Plan Note (Signed)
Persistent focal pain at the right greater trochanter worse with palpation.  No improvement over three months after tramadol, tylenol, exercise and ICE. Steroid injection today and sent patient home with exercises.  - return precautions discussed with patient -return in one month for A1C check and can reassess -continue exercises -tylenol PRN

## 2017-05-26 NOTE — Assessment & Plan Note (Signed)
Pt below BP goal of 130/90 - Continue 10 mg amlodipine - Continue 20-12 0.5 mg lisinopril/hydrochlorothiazide 2 times daily

## 2017-05-26 NOTE — Progress Notes (Signed)
    Subjective:  Kathleen Larson is a 51 y.o. female who [facility] for BP follow up  HPI:  Hypertension BP Readings from Last 3 Encounters:  05/26/17 128/88  03/25/17 138/86  03/09/17 130/88   Home BP monitoring-yes Compliant with medications-yes without side effects ROS-Denies any CP, HA, SOB, blurry vision, LE edema, transient weakness, orthopnea, PND.   Right hip pain:  - fell almost three months ago and has persistent lateral hip pain - evaluated on 03/09/17 and noted to have focal pain at greater trochanter, no improvement with tramadol, tylenol or ice ROS: no weakness, numbness, tingling    PMH: CVA, HTN, pre-diabetes Tobacco use: none Medication: reviewed and updated ROS: see HPI   Objective:  Physical Exam: BP 128/88   Pulse 76   Temp 98.4 F (36.9 C) (Oral)   Ht 5\' 5"  (1.651 m)   Wt 231 lb (104.8 kg)   LMP 10/14/2012   SpO2 98%   BMI 38.44 kg/m   Gen: 51yo F in NAD, resting comfortably CV: RRR with no murmurs appreciated Pulm: NWOB, CTAB with no crackles, wheezes, or rhonchi GI: Normal bowel sounds present. Soft, Nontender, Nondistended. MSK:  Knee exam: normal exam, no swelling, tenderness, instability; ligaments intact, FROM, antalgic gait, soft tissue tenderness over greater trochanter on R side, negative drawer sign, negative pivot-shift, collateral ligaments intact, negative McMurray sign, negative Lachman sign Skin: warm, dry Neuro: grossly normal, moves all extremities Psych: Normal affect and thought content  Results for orders placed or performed in visit on 05/26/17 (from the past 72 hour(s))  POCT glucose (manual entry)     Status: Abnormal   Collection Time: 05/26/17  1:45 PM  Result Value Ref Range   POC Glucose 186 (A) 70 - 99 mg/dl     INJECTION: Patient was given informed consent, signed copy in the chart. Appropriate time out was taken. Area prepped and draped in usual sterile fashion. 1 cc of methylprednisolone  40 mg/ml plus  2 cc of 1% lidocaine without epinephrine was injected into the right trochanteric bursa.The patient tolerated the procedure well. There were no complications. Post procedure instructions were given.    Assessment/Plan:  Essential hypertension Pt below BP goal of 130/90 - Continue 10 mg amlodipine - Continue 20-12 0.5 mg lisinopril/hydrochlorothiazide 2 times daily  Greater trochanteric bursitis of right hip Persistent focal pain at the right greater trochanter worse with palpation.  No improvement over three months after tramadol, tylenol, exercise and ICE. Steroid injection today and sent patient home with exercises.  - return precautions discussed with patient -return in one month for A1C check and can reassess -continue exercises -tylenol PRN

## 2017-05-26 NOTE — Patient Instructions (Addendum)
Kathleen Larson, you were seen today for blood pressure follow up and for right hip pain.  You have what's called trochanteric bursitis, which is inflammation at your hip.  I have injected your right hip with a steroid and I would like you to do the following procedures below.  You can continue to use ICE as needed and tylenol.  Please follow up in one month to have your hemoglobin A1C checked for diabetes.  If you develop redness, pain, swelling, drainage, fever or chills please come back to the office sooner.   I hope you feel better!  Sandeep Delagarza L. Myrtie Soman, MD [facility] Resident PGY-2 05/26/2017 2:43 PM     Trochanteric Bursitis Rehab Ask your health care provider which exercises are safe for you. Do exercises exactly as told by your health care provider and adjust them as directed. It is normal to feel mild stretching, pulling, tightness, or discomfort as you do these exercises, but you should stop right away if you feel sudden pain or your pain gets worse.Do not begin these exercises until told by your health care provider. Stretching exercises These exercises warm up your muscles and joints and improve the movement and flexibility of your hip. These exercises also help to relieve pain and stiffness. Exercise A: Iliotibial band stretch  1. Lie on your side with your left / right leg in the top position. 2. Bend your left / right knee and grab your ankle. 3. Slowly bring your knee back so your thigh is behind your body. 4. Slowly lower your knee toward the floor until you feel a gentle stretch on the outside of your left / right thigh. If you do not feel a stretch and your knee will not fall farther, place the heel of your other foot on top of your outer knee and pull your thigh down farther. 5. Hold this position for __________ seconds. 6. Slowly return to the starting position. Repeat __________ times. Complete this exercise __________ times a day. Strengthening exercises These  exercises build strength and endurance in your hip and pelvis. Endurance is the ability to use your muscles for a long time, even after they get tired. Exercise B: Bridge ( hip extensors) 1. Lie on your back on a firm surface with your knees bent and your feet flat on the floor. 2. Tighten your buttocks muscles and lift your buttocks off the floor until your trunk is level with your thighs. You should feel the muscles working in your buttocks and the back of your thighs. If this exercise is too easy, try doing it with your arms crossed over your chest. 3. Hold this position for __________ seconds. 4. Slowly return to the starting position. 5. Let your muscles relax completely between repetitions. Repeat __________ times. Complete this exercise __________ times a day. Exercise C: Squats ( knee extensors and  quadriceps) 1. Stand in front of a table, with your feet and knees pointing straight ahead. You may rest your hands on the table for balance but not for support. 2. Slowly bend your knees and lower your hips like you are going to sit in a chair. ? Keep your weight over your heels, not over your toes. ? Keep your lower legs upright so they are parallel with the table legs. ? Do not let your hips go lower than your knees. ? Do not bend lower than told by your health care provider. ? If your hip pain increases, do not bend as low. 3. Hold  this position for __________ seconds. 4. Slowly push with your legs to return to standing. Do not use your hands to pull yourself to standing. Repeat __________ times. Complete this exercise __________ times a day. Exercise D: Hip hike 1. Stand sideways on a bottom step. Stand on your left / right leg with your other foot unsupported next to the step. You can hold onto the railing or wall if needed for balance. 2. Keeping your knees straight and your torso square, lift your left / right hip up toward the ceiling. 3. Hold this position for __________  seconds. 4. Slowly let your left / right hip lower toward the floor, past the starting position. Your foot should get closer to the floor. Do not lean or bend your knees. Repeat __________ times. Complete this exercise __________ times a day. Exercise E: Single leg stand 1. Stand near a counter or door frame that you can hold onto for balance as needed. It is helpful to stand in front of a mirror for this exercise so you can watch your hip. 2. Squeeze your left / right buttock muscles then lift up your other foot. Do not let your left / right hip push out to the side. 3. Hold this position for __________ seconds. Repeat __________ times. Complete this exercise __________ times a day. This information is not intended to replace advice given to you by your health care provider. Make sure you discuss any questions you have with your health care provider. Document Released: 12/03/2004 Document Revised: 07/02/2016 Document Reviewed: 10/11/2015 Elsevier Interactive Patient Education  [employer]2018 Elsevier Inc.

## 2017-05-26 NOTE — Addendum Note (Signed)
Addended by: Jone BasemanFLEEGER, Ethell Blatchford D on: 05/26/2017 05:13 PM   Modules accepted: Orders

## 2017-06-02 ENCOUNTER — Telehealth: Payer: Self-pay | Admitting: Family Medicine

## 2017-06-02 NOTE — Telephone Encounter (Signed)
Have attempted to call pharmacy to refill tramadol over the phone and have been left on the hook waiting for pharmacist. Happy to refill.  Please call pharmacy at: [phone]   My DEA and NPI are below.  DEA #:  [id number] NPI:  [id number]   I'm ok with refilling her tramadol 50mg  q6hrs PRN, dispense 20 pills. Two refills.   Thanks, Rande Bruntaniel L. [name], MD [facility] Resident PGY-2 06/02/2017 1:16 PM

## 2017-06-02 NOTE — Telephone Encounter (Signed)
Patient states that [facility] on [address] has not received the medication that were refilled on 05/26/17. Requesting them to be called in so she can pick them up.

## 2017-06-02 NOTE — Telephone Encounter (Signed)
Called pharmacy. Plavix and Norvasc can be picked up today. Epic shows Tramadol was printed but no Rx up front for pick up. Dr. [name] do you have the Rx?  Please advise. Sunday SpillersSharon T Saunders, CMA

## 2017-06-03 NOTE — Telephone Encounter (Signed)
Called in the below Rx as given in the message below. Also if you could update the refill because the original that was put in on 05/26/17 did not show any refills. Lamonte SakaiZimmerman Rumple, Aryka Coonradt D, [place]

## 2017-06-04 ENCOUNTER — Other Ambulatory Visit: Payer: Self-pay | Admitting: Family Medicine

## 2017-06-04 DIAGNOSIS — I10 Essential (primary) hypertension: Secondary | ICD-10-CM

## 2017-06-04 MED ORDER — TRAMADOL HCL 50 MG PO TABS: 50.0000 mg | ORAL_TABLET | Freq: Four times a day (QID) | ORAL | 3 refills | Status: DC | PRN

## 2017-06-04 NOTE — Telephone Encounter (Signed)
I verified that the Rx had been updated and it had however it was placed with 3 refills and I called into the pharmacy with the 2 refills that was mentioned in the message below.  Making note just incase there is any concerns with the two different Rx's. Lamonte SakaiZimmerman Rumple, April D, [place]

## 2017-06-04 NOTE — Telephone Encounter (Signed)
Thanks April.  Fixed it.  [name]. [name], MD [facility] Resident PGY-2 06/04/2017 9:00 AM

## 2017-06-21 ENCOUNTER — Encounter: Payer: Self-pay | Admitting: Family Medicine

## 2017-06-21 ENCOUNTER — Ambulatory Visit (INDEPENDENT_AMBULATORY_CARE_PROVIDER_SITE_OTHER): Payer: BLUE CROSS/BLUE SHIELD | Admitting: Family Medicine

## 2017-06-21 VITALS — BP 135/80 | HR 66 | Temp 98.1°F | Ht 65.0 in | Wt 229.8 lb

## 2017-06-21 DIAGNOSIS — E1169 Type 2 diabetes mellitus with other specified complication: Secondary | ICD-10-CM | POA: Insufficient documentation

## 2017-06-21 DIAGNOSIS — H9313 Tinnitus, bilateral: Secondary | ICD-10-CM | POA: Insufficient documentation

## 2017-06-21 DIAGNOSIS — E119 Type 2 diabetes mellitus without complications: Secondary | ICD-10-CM

## 2017-06-21 DIAGNOSIS — R7303 Prediabetes: Secondary | ICD-10-CM

## 2017-06-21 HISTORY — DX: Type 2 diabetes mellitus with other specified complication: E11.69

## 2017-06-21 LAB — POCT GLYCOSYLATED HEMOGLOBIN (HGB A1C): HEMOGLOBIN A1C: 8.3

## 2017-06-21 NOTE — Assessment & Plan Note (Signed)
Reports tinnitus for past year in both ears that is constant and buzzing sound.  No pain in ears. Did not pass hearing screen in R ear.  No offending medication.  - audiology referral for possible hearing loss - [facility] referral

## 2017-06-21 NOTE — Assessment & Plan Note (Signed)
Last A1C to 8.3 down from 8.9.  Given history of CVA and significant HTN will start metformin.   - f/u BMP - metformin 500mg  BID - f/u 51mo recheck A1C

## 2017-06-21 NOTE — Progress Notes (Signed)
    Subjective:  Kathleen Larson is a 51 y.o. female who [facility] today with a chief complaint buzzing in her ears   HPI:  DIABETES Type II Medications: Trial of diet Blood Sugars per patient: Not taking Diet- watermelon, corn, fish Regular Exercise- 2x week for 30 min  Health Maintenance Due  Topic Date Due  . COLONOSCOPY  01/19/2014  . INFLUENZA VACCINE  06/09/2017   On Aspirin-yes On statin-yes Daily foot monitoring-no, NOS  Last eye exam: one year  ROS- Denies Polyuria,Polydipsia, nocturia, Vision changes, feet or hand numbness/pain/tingling. Denies  Hypoglycemia symptoms (shaky, sweaty, hungry, weak anxious, tremor, palpitations, confusion, behavior change).   Hemoglobin a1c:  Lab Results  Component Value Date   HGBA1C 8.3 06/21/2017   HGBA1C 8.9 03/09/2017   HGBA1C 5.9 (H) 05/16/2016   Buzzing in ears: Symptoms have been present since her stroke 7/18 and buzzing sound is constant and can be heard in both ears. No changes in vision, excessive consumption of NSAID or tylenol. Has been disruptive to her sleep and has been effecting her mood.   Objective:  Physical Exam: BP 135/80 (BP Location: Left Arm, Patient Position: Sitting, Cuff Size: Large)   Pulse 66   Temp 98.1 F (36.7 C) (Oral)   Ht 5\' 5"  (1.651 m)   Wt 229 lb 12.8 oz (104.2 kg)   LMP 10/14/2012   SpO2 98%   BMI 38.24 kg/m   Gen: 51yo F in NAD, resting comfortably HEENT: cerumen  CV: RRR with no murmurs appreciated Pulm: NWOB, CTAB with no crackles, wheezes, or rhonchi GI: Normal bowel sounds present. Soft, Nontender, Nondistended. MSK: no edema, cyanosis, or clubbing noted Skin: warm, dry Neuro: grossly normal, moves all extremities Psych: Normal affect and thought content  Results for orders placed or performed in visit on 06/21/17 (from the past 72 hour(s))  HgB A1c     Status: Abnormal   Collection Time: 06/21/17 11:00 AM  Result Value Ref Range   Hemoglobin A1C 8.3     Diabetic Foot Exam - Simple   Simple Foot Form Diabetic Foot exam was performed with the following findings:  Yes 06/21/2017 11:56 AM  Visual Inspection No deformities, no ulcerations, no other skin breakdown bilaterally:  Yes Sensation Testing See comments:  Yes Pulse Check Posterior Tibialis and Dorsalis pulse intact bilaterally:  Yes Comments Patient has decreased sensation in her right LE at baseline 2/2 stroke. LLE normal.       Assessment/Plan:  Tinnitus of both ears Reports tinnitus for past year in both ears that is constant and buzzing sound.  No pain in ears. Did not pass hearing screen in R ear.  No offending medication.  - audiology referral for possible hearing loss - [facility] referral  Type 2 diabetes mellitus (HCC) Last A1C to 8.3 down from 8.9.  Given history of CVA and significant HTN will start metformin.   - f/u BMP - metformin 500mg  BID - f/u 2mo recheck A1C

## 2017-06-21 NOTE — Patient Instructions (Signed)
Kathleen RayaConstance, you were seen today for diabetes follow up.  Your hemoglobin A1C is still elevated at 8.3.  At this point, given your stroke history we need to start some medication.  First I will check your kidney levels to make sure these are ok.  I am prescribing metformin 500mg  that you will take two times a day.   We also discussed the buzzing in your ears.  My nurse checked your hearing and there appears to be a problem on the right side.  We will have you see an audiologist to check your hearing and they can also help with your buzzing.   If you have any questions please call the office.   Very nice to see you today, Kathleen Larson L. [name], MD [facility] Resident PGY-2 06/21/2017 12:15 PM

## 2017-06-22 ENCOUNTER — Other Ambulatory Visit: Payer: Self-pay | Admitting: Family Medicine

## 2017-06-22 ENCOUNTER — Telehealth: Payer: Self-pay | Admitting: Family Medicine

## 2017-06-22 LAB — BASIC METABOLIC PANEL
BUN/Creatinine Ratio: 13 (ref 9–23)
BUN: 13 mg/dL (ref 6–24)
CALCIUM: 9.9 mg/dL (ref 8.7–10.2)
CO2: 28 mmol/L (ref 20–29)
Chloride: 97 mmol/L (ref 96–106)
Creatinine, Ser: 0.98 mg/dL (ref 0.57–1.00)
GFR calc Af Amer: 76 mL/min/{1.73_m2} (ref >59)
GFR, EST NON AFRICAN AMERICAN: 66 mL/min/{1.73_m2} (ref >59)
GLUCOSE: 133 mg/dL — AB (ref 65–99)
POTASSIUM: 4 mmol/L (ref 3.5–5.2)
SODIUM: 141 mmol/L (ref 134–144)

## 2017-06-22 MED ORDER — METFORMIN HCL 500 MG PO TABS: 500.0000 mg | ORAL_TABLET | Freq: Two times a day (BID) | ORAL | 3 refills | Status: DC

## 2017-06-22 NOTE — Telephone Encounter (Signed)
Called patient to let her know that creatinine level was WNL and that I sent in prescription for metformin 500mg  BID.

## 2017-08-13 ENCOUNTER — Other Ambulatory Visit: Payer: Self-pay | Admitting: Family Medicine

## 2017-08-13 DIAGNOSIS — I10 Essential (primary) hypertension: Secondary | ICD-10-CM

## 2017-08-24 ENCOUNTER — Telehealth: Payer: Self-pay

## 2017-08-24 NOTE — Telephone Encounter (Signed)
Patient called and needs a refill on her blood pressure medicine.Kathleen Larson

## 2017-08-26 ENCOUNTER — Other Ambulatory Visit: Payer: Self-pay | Admitting: Family Medicine

## 2017-08-26 DIAGNOSIS — I10 Essential (primary) hypertension: Secondary | ICD-10-CM

## 2017-08-26 MED ORDER — AMLODIPINE BESYLATE 10 MG PO TABS: 10.0000 mg | ORAL_TABLET | Freq: Every day | ORAL | 1 refills | Status: DC

## 2017-08-26 MED ORDER — LISINOPRIL-HYDROCHLOROTHIAZIDE 20-12.5 MG PO TABS: 2.0000 | ORAL_TABLET | Freq: Every day | ORAL | 2 refills | Status: DC

## 2017-08-26 NOTE — Telephone Encounter (Signed)
Medication refilled.   FYI if patient calls again.   Thanks,  Rande Bruntaniel L. [name], MD [facility] Resident PGY-2 08/26/2017 8:31 AM

## 2017-08-27 NOTE — Telephone Encounter (Signed)
Pt informed. Zimmerman Rumple, April D, CMA  

## 2017-10-14 ENCOUNTER — Ambulatory Visit: Payer: BLUE CROSS/BLUE SHIELD | Attending: Family Medicine | Admitting: Audiology

## 2017-10-14 DIAGNOSIS — R94128 Abnormal results of other function studies of ear and other special senses: Secondary | ICD-10-CM | POA: Diagnosis present

## 2017-10-14 DIAGNOSIS — H9191 Unspecified hearing loss, right ear: Secondary | ICD-10-CM | POA: Insufficient documentation

## 2017-10-14 DIAGNOSIS — Z01118 Encounter for examination of ears and hearing with other abnormal findings: Secondary | ICD-10-CM | POA: Insufficient documentation

## 2017-10-14 DIAGNOSIS — H90A32 Mixed conductive and sensorineural hearing loss, unilateral, left ear with restricted hearing on the contralateral side: Secondary | ICD-10-CM | POA: Diagnosis not present

## 2017-10-14 DIAGNOSIS — H9192 Unspecified hearing loss, left ear: Secondary | ICD-10-CM | POA: Diagnosis present

## 2017-10-14 NOTE — Patient Instructions (Signed)
CONCLUSION:      Kathleen Larson has hearing loss in both ears. 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  This amount of hearing loss would adversely affect speech communication at normal conversational speech levels. Word recognition is excellent in each ear, at louder than normal conversational speech levels, especially in the right.   Of concern is that the tinnitus is bothersome and constant.   An ENT referral is needed because of the assymetrical hearing loss, poorer on the right side with right sided tinnitus.  Kathleen Larson may also benefit from amplification; therefore a hearing aid evaluation is recommended.  Amplification helps make the signal louder and therefore often improves hearing and word recognition.  Amplification has many forms including hearing aids in one or both ears, an assistive listening device which have a microphone and speaker such as a small handheld device and/or even a surround sound system of speakers.  Amplification may be covered by some insurances, but not all.  It is important to note that hearing aids must be individually fit according to the hearing test results and the ear shape.  Audiologists and hearing aid dealers in [place] must be licensed in order to dispense  hearing aids.  In addition, a trial period is mandated by law in our state because often amplification must be tried and then evaluated in order to determine benefit.  There are many excellent choices when it comes to amplification in our area and providers are listed in the phone book under hearing aids, there are audiologists in private practice, those affiliated with Ear, Nose and Throat physicians, and there are audiologists located at [facility] [employer]. The test results were discussed and Kathleen Larson counseled.   RECOMMENDATIONS: 1.   Referral  to an Ear, Nose and Throat physician because of a) right sided tinnitus b) hearing loss poorer on the right side. 2.   A hearing aid evaluation.  (Please check with insurance for hearing benefit for 2018 and for 2019 to help determine optimal benefit since Kathleen Larson is "on disability").  3. To minimize the adverse effects of tinnitus 1) avoid quiet  2) use noise maskers at home such as a sound machine, quiet music, a fan or other background noise at a volume just loud enough to mask the high pitched tinnitus. 3) If the tinnitus becomes more bothersome, adversely affecting your sleep or concentration, contact your physician,  seek additional medical help by an ENT for further treatment of your tinnitus. 4.  Strategies that help improve hearing include: A) Face the speaker directly. Optimal is having the speakers face well - lit.  Unless amplified, being within 3-6 feet of the speaker will enhance word recognition. B) Avoid having the speaker back-lit as this will minimize the ability to use cues from lip-reading, facial expression and gestures. C)  Word recognition is poorer in background noise. For optimal word recognition, turn off the TV, radio or noisy fan when engaging in conversation. In a restaurant, try to sit away from noise sources and close to the primary speaker.   D)  Ask for topic clarification from time to time in order to remain in the conversation.  Most people don't mind repeating or clarifying a point when asked.  If needed, explain the difficulty hearing in background noise or hearing loss.  Please follow-up with  the referral department at Kathleen Larson, Kathleen L, MD's office to find out where the ENT referral is made.  Kathleen Larson L. Kathleen Larson, Au.D., CCC-A Doctor of Audiology 10/14/2017   cc: Kathleen Larson, Kathleen L, MD

## 2017-10-14 NOTE — Procedures (Signed)
[facility]patient Audiology and [facility]  [address]  [place], [place] [id number]  [phone]   Audiological Evaluation  Patient Name: Kathleen SchoolConstance D Zirbel  Status: Outpatient   DOB: 02/17/1966    Diagnosis: Right Hearing Loss                 \MRN: [id number] Date:  10/14/2017     Referent: [name], [name], MD  History: Kathleen Larson was seen for an audiological evaluation.  Primary Concern: Thinks right ear hearing loss started when had "a stroke July 2017", which affected her speech. "People tell me I'm talking loud".  Pain: None History of hearing problems:  N History of ear infections:  N History of ear surgery or "tubes" : N History of dizziness/vertigo:   N History of balance issues:  Y - when leaning over, gets off-balance.  Tinnitus: Y - "noise in right ear that sounds like a dial tone -all of the time" - "Sometimes gets extra loud and is irritating" - this started after the stroke.  Sound sensitivity: Y - has gotten startled by loud noise, "after the stroke".  History of occupational noise exposure: N History of hypertension: Y - controlled with medication. History of diabetes:  Y- controlled with medication. Family history of hearing loss: N     Evaluation: Conventional pure tone audiometry from 250Hz  - 8000Hz  with using insert earphones.  Hearing Thresholds: Right ear:  Thresholds of 40-45dBHL from 250Hz  - 4000Hz  and70 dBHL at 8000Hz  p the hearing loss appears sensorineural.  Left ear:    Thresholds of 30-35 dBHL - the hearing loss appears mixed. Reliability is good Speech reception levels (repeating words near threshold) using recorded spondee word lists:  Right ear: 45 dBHL.  Left ear:  30 dBHL Word recognition (at comfortably loud volumes) using recorded NU-6 word lists, in quiet.  Right ear: 96% at 80 dBHL with 75 dBHL masking.  Left ear:   96% at 70 dBHL. Tympanometry shows normal middle ear volume, pressure and compliance (Type A)  bilaterally. Ipsilateral acoustic reflexes ae 85-90dB bilaterally except for no response at 4000Hz  and 95dB at 1000Hz  on the right side.  Retrocochlear testing appears negative in the left ear but was inconclusive, but probably negative in the right ear because of the .  Acoustic reflex decay is negative on the left side and did not show enough defection on the right to be evaluated.  CONCLUSION:      An ENT referral is needed because of the assymetrical hearing loss, poorer on the right side with right sided tinnitus.  Ladean RayaConstance has hearing loss in both ears. 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  This amount of hearing loss would adversely affect speech communication at normal conversational speech levels. Word recognition is excellent in each ear, at louder than normal conversational speech levels, especially in the right.   Of concern is that the tinnitus is bothersome and constant.  Ladean RayaConstance also needs a hearing aid evaluation because a hearing aid evaluation is strongly recommended. As discussed with Ladean RayaConstance, hearing aids are especially important because she may question whether misunderstanding is due to "the stroke" or "the hearing loss".     Amplification helps make the signal louder and therefore often improves hearing and word recognition.  Amplification has many forms including hearing aids in one or  both ears, an assistive listening device which have a microphone and speaker such as a small handheld device and/or even a surround sound system of speakers.  Amplification may be covered by some insurances, but not all.  It is important to note that hearing aids must be individually fit according to the hearing test results and the ear shape.  Audiologists and hearing aid dealers in [place] must be licensed in order to dispense   hearing aids.  In addition, a trial period is mandated by law in our state because often amplification must be tried and then evaluated in order to determine benefit.  There are many excellent choices when it comes to amplification in our area and providers are listed in the phone book under hearing aids, there are audiologists in private practice, those affiliated with Ear, Nose and Throat physicians, and there are audiologists located at [facility] [employer]. The test results were discussed and Kathleen SchoolConstance D Requena counseled.   RECOMMENDATIONS: 1.   Referral to an Ear, Nose and Throat physician because of a) right sided tinnitus b) hearing loss poorer on the right side. 2.   A hearing aid evaluation.  (Please check with insurance for hearing benefit for 2018 and for 2019 to help determine optimal benefit since Kathleen SchoolConstance D Roemer is "on disability").  3. To minimize the adverse effects of tinnitus 1) avoid quiet  2) use noise maskers at home such as a sound machine, quiet music, a fan or other background noise at a volume just loud enough to mask the high pitched tinnitus. 3) If the tinnitus becomes more bothersome, adversely affecting your sleep or concentration, contact your physician,  seek additional medical help by an ENT for further treatment of your tinnitus. 4.  Strategies that help improve hearing include: A) Face the speaker directly. Optimal is having the speakers face well - lit.  Unless amplified, being within 3-6 feet of the speaker will enhance word recognition. B) Avoid having the speaker back-lit as this will minimize the ability to use cues from lip-reading, facial expression and gestures. C)  Word recognition is poorer in background noise. For optimal word recognition, turn off the TV, radio or noisy fan when engaging in conversation. In a restaurant, try to sit away from noise sources and close to the primary speaker.   D)  Ask for topic clarification from time to time in order to remain  in the conversation.  Most people don't mind repeating or clarifying a point when asked.  If needed, explain the difficulty hearing in background noise or hearing loss.  Please follow-up with the referral department at [name], [name], MD's office to find out where the ENT referral is made.  Deborah L. Kate SableWoodward, Au.D., CCC-A Doctor of Audiology 10/14/2017   cc: [name], [name], MD

## 2017-10-15 ENCOUNTER — Ambulatory Visit: Payer: BLUE CROSS/BLUE SHIELD | Admitting: Family Medicine

## 2017-10-22 ENCOUNTER — Encounter: Payer: Self-pay | Admitting: Family Medicine

## 2017-10-22 ENCOUNTER — Ambulatory Visit: Payer: BLUE CROSS/BLUE SHIELD | Admitting: Family Medicine

## 2017-10-22 ENCOUNTER — Other Ambulatory Visit: Payer: Self-pay

## 2017-10-22 VITALS — BP 148/100 | HR 85 | Temp 97.5°F | Ht 65.0 in | Wt 228.0 lb

## 2017-10-22 DIAGNOSIS — I10 Essential (primary) hypertension: Secondary | ICD-10-CM

## 2017-10-22 DIAGNOSIS — H906 Mixed conductive and sensorineural hearing loss, bilateral: Secondary | ICD-10-CM

## 2017-10-22 DIAGNOSIS — K148 Other diseases of tongue: Secondary | ICD-10-CM | POA: Diagnosis not present

## 2017-10-22 DIAGNOSIS — Z23 Encounter for immunization: Secondary | ICD-10-CM | POA: Diagnosis not present

## 2017-10-22 HISTORY — DX: Other diseases of tongue: K14.8

## 2017-10-22 MED ORDER — LISINOPRIL-HYDROCHLOROTHIAZIDE 20-12.5 MG PO TABS: 2.0000 | ORAL_TABLET | Freq: Every day | ORAL | 2 refills | Status: DC

## 2017-10-22 MED ORDER — BENZOCAINE 10 % MT GEL: 1.0000 "application " | OROMUCOSAL | 0 refills | Status: DC | PRN

## 2017-10-22 NOTE — Progress Notes (Signed)
    Subjective:  Kathleen Larson is a 51 y.o. female [facility] with a chief complaint of tongue pain and follow-up for hearing loss  HPI:  Hearing loss with tinnitus Patient reports ringing in her ears this constant buzzing sound.  Initially in the office she did not pass her hearing screen in the right ear.  She had an audiology visit this past week where they found that she had some moderate to severe sensorineural hearing loss in the right side and mild mixed hearing loss in the left side.  She reports that her symptoms have not worsened since her last visit.  Audiology recommended a ENT referral.   Tongue pain Patient reports for the last week that she has been having pain on the posterior aspect of her left lateral tongue.  She denies any trauma.  Patient has no smoking history.  She has no known exposure to HPV.  She does have a history of poor dentition.  She denies any fevers, chills, nausea, vomiting or diarrhea.   PMH: CVA, type 2 diabetes, hearing loss, hypertension Tobacco use: Never smoker Medication: reviewed and updated ROS: see HPI   Objective:  Physical Exam: BP (!) 148/100   Pulse 85   Temp (!) 97.5 F (36.4 C) (Oral)   Ht 5\' 5"  (1.651 m)   Wt 228 lb (103.4 kg)   LMP 10/14/2012   SpO2 94%   BMI 37.94 kg/m   Gen: 51 year old female in NAD, resting comfortably HEENT: Atraumatic normocephalic, moist mucous membranes, 2-3 cm ulcerated lesion at the posterior lateral aspect of the left side of her tongue.  No drainage.  This region is tender to palpation.  Patient has poor dentition including a necrotic and infected tooth #17 just adjacent to this lesion on the tongue.  CV: RRR with no murmurs appreciated Pulm: NWOB, CTAB with no crackles, wheezes, or rhonchi GI: Normal bowel sounds present. Soft, Nontender, Nondistended. MSK: no edema, cyanosis, or clubbing noted Skin: warm, dry Neuro: grossly normal, moves all extremities Psych: Normal  affect and thought content  No results found for this or any previous visit (from the past 72 hour(s)).     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  Recent audiology visit last week noted moderate to severe sensorineural hearing loss in the right side in the next mild hearing loss in the left side.  Recommended referral to ENT for hearing aid evaluation.  Placed referral to ENT.  Will follow peripherally.  Lesion of tongue Patient with new to 3 cm ulcerated lesion on the posterior lateral aspect of the left side of her tongue that is tender to palpation without bleeding, drainage and no constitutional symptoms.  She has no known risk factors for squamous cell carcinoma.  I recommend the patient use Orajel as needed for pain and follow-up in 2 weeks to track the size of the lesion.  She does have poor dentition including a necrotic tooth just adjacent to the lesion.  Discussed importance of follow-up with this patient and that if lesion does not improve over the 2-weeks I will refer to ENT for biopsy.  Discussed return precautions.   Health maintenance Patient received pneumococcal and flu shot today.  Has eye exam and colonoscopy coming up.   [name]. [name], MD [facility] Resident PGY-2 10/22/2017 12:57 PM

## 2017-10-22 NOTE — Assessment & Plan Note (Signed)
Patient with new to 3 cm ulcerated lesion on the posterior lateral aspect of the left side of her tongue that is tender to palpation without bleeding, drainage and no constitutional symptoms.  She has no known risk factors for squamous cell carcinoma.  I recommend the patient use Orajel as needed for pain and follow-up in 2 weeks to track the size of the lesion.  She does have poor dentition including a necrotic tooth just adjacent to the lesion.  Discussed importance of follow-up with this patient and that if lesion does not improve over the 2-weeks I will refer to ENT for biopsy.  Discussed return precautions.

## 2017-10-22 NOTE — Assessment & Plan Note (Signed)
Chronic problem.  Recent audiology visit last week noted moderate to severe sensorineural hearing loss in the right side in the next mild hearing loss in the left side.  Recommended referral to ENT for hearing aid evaluation.  Placed referral to ENT.  Will follow peripherally.

## 2017-10-22 NOTE — Patient Instructions (Signed)
Kathleen Larson, you were seen today for follow-up for your hearing loss.  I placed a referral to the ear nose and throat surgeons for hearing aid evaluation.  Additionally you are found to have a lesion on the back part of your tongue.  I placed a prescription for Kathleen Larson to help with any discomfort.  This appears to not be reimbursable and may be cheaper for you to buy over-the-counter.  I need you to follow-up with me in 2 weeks to make sure the lesion has not grown any bigger.  If there is no improvement over this 2-week period.  I will place referral for you to see the ear nose and throat surgeons you may need a biopsy.   Kathleen Horst L. [name], MD [facility] Resident PGY-2 10/22/2017 11:22 AM

## 2017-11-11 ENCOUNTER — Encounter: Payer: Self-pay | Admitting: Family Medicine

## 2017-11-11 ENCOUNTER — Ambulatory Visit: Payer: BLUE CROSS/BLUE SHIELD | Admitting: Family Medicine

## 2017-11-11 ENCOUNTER — Other Ambulatory Visit: Payer: Self-pay

## 2017-11-11 VITALS — BP 108/70 | HR 80 | Temp 98.1°F | Wt 223.0 lb

## 2017-11-11 DIAGNOSIS — E119 Type 2 diabetes mellitus without complications: Secondary | ICD-10-CM

## 2017-11-11 DIAGNOSIS — K148 Other diseases of tongue: Secondary | ICD-10-CM

## 2017-11-11 LAB — POCT GLYCOSYLATED HEMOGLOBIN (HGB A1C): HEMOGLOBIN A1C: 13.8

## 2017-11-11 MED ORDER — METFORMIN HCL 500 MG PO TABS: 1000.0000 mg | ORAL_TABLET | Freq: Two times a day (BID) | ORAL | 3 refills | Status: DC

## 2017-11-11 NOTE — Patient Instructions (Addendum)
Kathleen RayaConstance, you were seen today for follow-up for a lesion on your tongue.  I do not see any significant improvement over the last 2 weeks.  I would like you to follow-up with your nose and throat surgeon.  I would recommend that you call the office of the specialist that you are seeing this week for your hearing and ask for an additional appointment or if they can take a look at it at that appointment.   Additionally, your hemoglobin A1c has significantly increased since last time it was checked.  I want you to increase your metformin to 1000 mg 2 times a day and to follow-up with me in 1 month.  We will be rechecking your hemoglobin A1c in 3 months and we can consider going down on the dose at that point if you show significant improvement.  See below for diet recommendations for diabetes.    Diet Recommendations for Diabetes   Starchy (carb) foods: Bread, rice, pasta, potatoes, corn, cereal, grits, crackers, bagels, muffins, all baked goods.  (Fruits, milk, and yogurt also have carbohydrate, but most of these foods will not spike your blood sugar as most starchy foods will.)  A few fruits do cause high blood sugars; use small portions of bananas (limit to 1/2 at a time), grapes, watermelon, oranges, and most tropical fruits.    Protein foods: Meat, fish, poultry, eggs, dairy foods, and beans such as pinto and kidney beans (beans also provide carbohydrate).   1. Eat at least 3 meals and 1-2 snacks per day. Never go more than 4-5 hours while awake without eating. Eat breakfast within the first hour of getting up.   2. Limit starchy foods to TWO per meal and ONE per snack. ONE portion of a starchy  food is equal to the following:   - ONE slice of bread (or its equivalent, such as half of a hamburger bun).   - 1/2 cup of a "scoopable" starchy food such as potatoes or rice.   - 15 grams of Total Carbohydrate as shown on food label.  3. Include at every meal: a protein food, a carb food, and vegetables  and/or fruit.   - Obtain twice the volume of veg's as protein or carbohydrate foods for both lunch and dinner.   - Fresh or frozen veg's are best.   - Keep frozen veg's on hand for a quick vegetable serving.

## 2017-11-11 NOTE — Progress Notes (Signed)
Subjective:  Kathleen Larson is a 52 y.o. female who presents to the [facility] today for follow up for tongue lesion and hemoglobin A1C check  HPI:  Tongue lesion Patient is here for follow-up for tongue lesion on the posterior aspect of the left lateral tongue that appeared ulcerated 2 weeks ago on 10/22/2017.  I recommended that she come back in for evaluation to see if there is been improvement.  She currently denies any pain.  As noted previously she has had no trauma, however does have poor dentition particularly on the molars bilaterally.  And she continues to have no red flags including fevers chills nausea, vomiting or diarrhea.    Diabetes mellitus, Type 2 Patient is not currently taking her blood sugars.  She denies any polyuria, polydipsia or polyphagia.  Denies any visual changes.  She is prescribed metformin 500 mg twice daily and reports that she may miss several doses throughout the week.  Her last hemoglobin A1c was 8.3 on 06/21/2017.  Reports that she has not been compliant with diabetic recommended diet over the past several months.  Denies any hypoglycemic symptoms. Last foot exam eas 06/21/2017 and she is overdue for eye exam.   PMH: CVA, type 2 diabetes, hearing loss, hypertension Tobacco use: never smoker Medication: reviewed and updated ROS: see HPI   Objective:  Physical Exam: BP 108/70   Pulse 80   Temp 98.1 F (36.7 C) (Oral)   Wt 223 lb (101.2 kg)   LMP 10/14/2012   SpO2 98%   BMI 37.11 kg/m   Gen: 52 year old female in NAD, resting comfortably HEENT: Atraumatic normocephalic, moist mucous membranes, 2-3 cm ulcerated lesion at the posterior lateral aspect of the left side of her tongue.  No drainage.  This region is tender to palpation.  Patient has poor dentition including a necrotic and infected tooth #17 just adjacent to this lesion on the tongue.  CV: RRR with no murmurs appreciated Pulm: NWOB, CTAB with no crackles, wheezes, or rhonchi GI: Normal  bowel sounds present. Soft, Nontender, Nondistended. MSK: no edema, cyanosis, or clubbing noted Skin: warm, dry Neuro: grossly normal, moves all extremities Psych: Normal affect and thought content  Results for orders placed or performed in visit on 11/11/17 (from the past 72 hour(s))  HgB A1c     Status: Abnormal   Collection Time: 11/11/17 10:40 AM  Result Value Ref Range   Hemoglobin A1C 13.8      Assessment/Plan:  Lesion of tongue Patient has no appreciable improvement on tongue lesion on the posterior lateral aspect of the left side of her tongue.  I do not appreciate any palpable masses she has no drainage or bleeding.  No red flag signs or symptoms at this time.  This could be related to her poor dentition, however will defer to ENT as to whether or not a biopsy may be appropriate to rule out more serious pathology.  Discussed return precautions with patient.  Type 2 diabetes mellitus (HCC) Patient has had a significant increase in her hemoglobin A1c since August and is up to 13.8 on 500 mg metformin twice daily.  Patient reports noncompliance with medication and nonadherence to diabetic recommended diet.  I am recommending that she increase her dosage to 1000 mg twice daily and follow-up in 1 month and we can discuss if there are any additional barriers to adherence.  We will recheck hemoglobin A1c in 3 months.  Discussed return precautions and recommended that patient keep track of  sugars and call the office with any concerns or questions.   Kathleen Weatherall L. [name], MD [facility] Resident PGY-2 11/11/2017 12:58 PM

## 2017-11-11 NOTE — Assessment & Plan Note (Signed)
Patient has had a significant increase in her hemoglobin A1c since August and is up to 13.8 on 500 mg metformin twice daily.  Patient reports noncompliance with medication and nonadherence to diabetic recommended diet.  I am recommending that she increase her dosage to 1000 mg twice daily and follow-up in 1 month and we can discuss if there are any additional barriers to adherence.  We will recheck hemoglobin A1c in 3 months.  Discussed return precautions and recommended that patient keep track of sugars and call the office with any concerns or questions.

## 2017-11-11 NOTE — Assessment & Plan Note (Addendum)
Patient has no appreciable improvement on tongue lesion on the posterior lateral aspect of the left side of her tongue.  I do not appreciate any palpable masses she has no drainage or bleeding.  No red flag signs or symptoms at this time.  This could be related to her poor dentition, however will defer to ENT as to whether or not a biopsy may be appropriate to rule out more serious pathology.  Discussed return precautions with patient.

## 2017-11-18 ENCOUNTER — Other Ambulatory Visit: Payer: Self-pay | Admitting: Family Medicine

## 2017-11-18 DIAGNOSIS — Z139 Encounter for screening, unspecified: Secondary | ICD-10-CM

## 2017-12-17 ENCOUNTER — Ambulatory Visit
Admission: RE | Admit: 2017-12-17 | Discharge: 2017-12-17 | Disposition: A | Discharge status: Home / Self Care | Payer: BLUE CROSS/BLUE SHIELD | Source: Ambulatory Visit | Attending: Family Medicine | Admitting: Family Medicine

## 2017-12-17 DIAGNOSIS — Z139 Encounter for screening, unspecified: Secondary | ICD-10-CM

## 2017-12-22 ENCOUNTER — Ambulatory Visit: Payer: BLUE CROSS/BLUE SHIELD | Admitting: Family Medicine

## 2017-12-22 ENCOUNTER — Other Ambulatory Visit: Payer: Self-pay

## 2017-12-22 ENCOUNTER — Encounter: Payer: Self-pay | Admitting: Family Medicine

## 2017-12-22 VITALS — BP 116/78 | HR 85 | Temp 98.1°F | Ht 65.0 in | Wt 212.8 lb

## 2017-12-22 DIAGNOSIS — R413 Other amnesia: Secondary | ICD-10-CM

## 2017-12-22 DIAGNOSIS — E118 Type 2 diabetes mellitus with unspecified complications: Secondary | ICD-10-CM | POA: Diagnosis not present

## 2017-12-22 DIAGNOSIS — I10 Essential (primary) hypertension: Secondary | ICD-10-CM

## 2017-12-22 DIAGNOSIS — G3184 Mild cognitive impairment, so stated: Secondary | ICD-10-CM

## 2017-12-22 DIAGNOSIS — R350 Frequency of micturition: Secondary | ICD-10-CM | POA: Diagnosis not present

## 2017-12-22 MED ORDER — LISINOPRIL-HYDROCHLOROTHIAZIDE 20-12.5 MG PO TABS: 2.0000 | ORAL_TABLET | Freq: Every day | ORAL | 2 refills | Status: DC

## 2017-12-22 MED ORDER — AMLODIPINE BESYLATE 10 MG PO TABS: 10.0000 mg | ORAL_TABLET | Freq: Every day | ORAL | 1 refills | Status: DC

## 2017-12-22 NOTE — Patient Instructions (Signed)
Kathleen Larson, your seen today for diabetes checkup.  I think the your increased urinary frequency is probably due to your uncontrolled diabetes.  You need to be taking 2 500 mg pills of metformin in the morning and 2 pills at night for a total of 1000 mg 2 times a day.  As for Kathleen Larson memory changes we did a test which states that you may have mild cognitive impairment.  We will continue to closely monitor this.  Please follow-up with me in 2 months and we will check your hemoglobin A1c.   Please call the office with any concerns or questions. Great to see you!  Kathleen Larson L. [name], MD [facility] Resident PGY-2 12/22/2017 11:41 AM

## 2017-12-23 LAB — CBC
Hematocrit: 47.5 % — ABNORMAL HIGH (ref 34.0–46.6)
Hemoglobin: 15.3 g/dL (ref 11.1–15.9)
MCH: 27.2 pg (ref 26.6–33.0)
MCHC: 32.2 g/dL (ref 31.5–35.7)
MCV: 85 fL (ref 79–97)
Platelets: 253 10*3/uL (ref 150–379)
RBC: 5.62 x10E6/uL — ABNORMAL HIGH (ref 3.77–5.28)
RDW: 14.1 % (ref 12.3–15.4)
WBC: 4.8 10*3/uL (ref 3.4–10.8)

## 2017-12-23 LAB — CMP14+EGFR
ALT: 65 IU/L — ABNORMAL HIGH (ref 0–32)
AST: 37 IU/L (ref 0–40)
Albumin/Globulin Ratio: 1.4 (ref 1.2–2.2)
Albumin: 4.3 g/dL (ref 3.5–5.5)
Alkaline Phosphatase: 137 IU/L — ABNORMAL HIGH (ref 39–117)
BUN/Creatinine Ratio: 10 (ref 9–23)
BUN: 10 mg/dL (ref 6–24)
Bilirubin Total: 1.1 mg/dL (ref 0.0–1.2)
CO2: 23 mmol/L (ref 20–29)
Calcium: 9.8 mg/dL (ref 8.7–10.2)
Chloride: 93 mmol/L — ABNORMAL LOW (ref 96–106)
Creatinine, Ser: 1.04 mg/dL — ABNORMAL HIGH (ref 0.57–1.00)
GFR calc Af Amer: 71 mL/min/{1.73_m2} (ref >59)
GFR calc non Af Amer: 61 mL/min/{1.73_m2} (ref >59)
Globulin, Total: 3 g/dL (ref 1.5–4.5)
Glucose: 498 mg/dL — ABNORMAL HIGH (ref 65–99)
Potassium: 3.8 mmol/L (ref 3.5–5.2)
Sodium: 134 mmol/L (ref 134–144)
Total Protein: 7.3 g/dL (ref 6.0–8.5)

## 2017-12-23 LAB — TSH: TSH: 1.76 u[IU]/mL (ref 0.450–4.500)

## 2017-12-23 LAB — HIV ANTIBODY (ROUTINE TESTING W REFLEX): HIV Screen 4th Generation wRfx: NONREACTIVE

## 2017-12-23 LAB — RPR: RPR Ser Ql: NONREACTIVE

## 2017-12-24 NOTE — Progress Notes (Signed)
Subjective:  Kathleen Larson is a 52 y.o. female who [facility] for diabetes follow up and to discuss concerns over forgetfulness  HPI: Diabetes mellitus, Type 2 Disease Monitoring Blood Sugar Ranges: not checking Polyuria: no Visual problems: yes, chronic  Urine Microalbumin: patient is on lisinopril  Last A1C: 13.8 on 11/11/17  Medication Compliance: yes, but she is only taking metformin 500mg  BID  Medication Side Effects Hypoglycemia: no   Preventitive Health Care Eye Exam: due for eye exam Foot Exam: UTD  Memory difficulties Suffered CVA in 2017 and reports that since then she has been having difficulties with her memory and feels slower. She is wondering if she may have dementia. She has no family history of early onset dementia. Denies any CP, SOB, weakness or dizziness. At one point she was wondering if she may have depression. She is going through may difficult trials in her life at this time. She has LE deficit and speech deficit from the stroke as well as new onset hearing loss.  She was evaluated by ENT who report no options for operative cure and recommended hearing aids.   PMH: CVA, T2DM, hyperlipidemia Tobacco use: none Medication: reviewed and updated ROS: see HPI   Objective:  Physical Exam: BP 116/78   Pulse 85   Temp 98.1 F (36.7 C) (Oral)   Ht 5\' 5"  (1.651 m)   Wt 212 lb 12.8 oz (96.5 kg)   LMP 10/14/2012   SpO2 99%   BMI 35.41 kg/m   Gen: 51yo F in NAD, resting comfortably CV: RRR with no murmurs appreciated Pulm: NWOB, CTAB with no crackles, wheezes, or rhonchi GI: Normal bowel sounds present. Soft, Nontender, Nondistended. MSK: no edema, cyanosis, or clubbing noted Skin: warm, dry Neuro: grossly normal, moves all extremities Psych: Normal affect and thought content  No results found for this or any previous visit (from the past 72  hour(s)). [facility] 2/9 12/22/2017 12/22/2017 11/11/2017 10/22/2017 06/21/2017  Decreased Interest 1 0 0 0 2  Down, Depressed, Hopeless 1 0 0 0 2  PHQ - 2 Score 2 0 0 0 4  Altered sleeping 1 - - - 3  Tired, decreased energy 1 - - - 2  Change in appetite 1 - - - 1  Feeling bad or failure about yourself  1 - - - 3  Trouble concentrating 1 - - - 2  Moving slowly or fidgety/restless 1 - - - 3  Suicidal thoughts 1 - - - 1  PHQ-9 Score 9 - - - 19  Difficult doing work/chores Somewhat difficult - - - Very difficult   GAD 7 : Generalized Anxiety Score 12/22/2017  Nervous, Anxious, on Edge 1  Control/stop worrying 3  Worry too much - different things 1  Trouble relaxing 3  Restless 1  Easily annoyed or irritable 1  Afraid - awful might happen 1  Total GAD 7 Score 11  Anxiety Difficulty Not difficult at all    Moca: 24/30  Assessment/Plan:  Type 2 diabetes mellitus (HCC) A1C to 13.8 about one month ago. Has been taking metformin 500mg  BID. She was recommended to start taking 1000mg  BID at her last visit. CMP showing glucose to 498. Denies symptoms of DKA and does not appear dehydrated. Will have her follow up in early April for repeat A1C. Continue with metformin 1000mg  BID. Discussed return precautions.   Mild cognitive impairment Performed GAD7 with score of 11 and PHQ9 with score of 9.  Denies thoughts of self harm or thoughts of harming others. Performed moca with score of 24/30. She had most difficulty with pattern recognition on number one, drawing 3-dimensional box, immediate recall and 5 minute recall.  Obtained labs to investigate for reversible causes of cognitive impairment. CMP, RPR, TSH, CBC all largely WNL.  I have no concern for her wellbeing at this time or concern for her performing ADLs. Will continue to monitor. Might be reasonable to have her assessed in geriatric clinic by Dr. McDiarmid.  Shalini Mair L. Myrtie Soman, MD [facility] Resident PGY-2 12/27/2017  10:50 AM

## 2017-12-27 ENCOUNTER — Encounter: Payer: Self-pay | Admitting: Family Medicine

## 2017-12-27 DIAGNOSIS — G3184 Mild cognitive impairment, so stated: Secondary | ICD-10-CM

## 2017-12-27 HISTORY — DX: Mild cognitive impairment of uncertain or unknown etiology: G31.84

## 2017-12-27 NOTE — Assessment & Plan Note (Signed)
A1C to 13.8 about one month ago. Has been taking metformin 500mg  BID. She was recommended to start taking 1000mg  BID at her last visit. CMP showing glucose to 498. Denies symptoms of DKA and does not appear dehydrated. Will have her follow up in early April for repeat A1C. Continue with metformin 1000mg  BID. Discussed return precautions.

## 2017-12-27 NOTE — Assessment & Plan Note (Addendum)
Performed GAD7 with score of 11 and PHQ9 with score of 9. Denies thoughts of self harm or thoughts of harming others. Performed moca with score of 24/30. She had most difficulty with pattern recognition on number one, drawing 3-dimensional box, immediate recall and 5 minute recall.  Obtained labs to investigate for reversible causes of cognitive impairment. CMP, RPR, TSH, CBC all largely WNL.  I have no concern for her wellbeing at this time or concern for her performing ADLs. Will continue to monitor. Might be reasonable to have her assessed in geriatric clinic by Dr. McDiarmid.

## 2018-01-11 ENCOUNTER — Other Ambulatory Visit: Payer: Self-pay

## 2018-01-11 DIAGNOSIS — I63519 Cerebral infarction due to unspecified occlusion or stenosis of unspecified middle cerebral artery: Secondary | ICD-10-CM

## 2018-01-11 MED ORDER — CLOPIDOGREL BISULFATE 75 MG PO TABS: 75.0000 mg | ORAL_TABLET | Freq: Every day | ORAL | 1 refills | Status: DC

## 2018-03-10 ENCOUNTER — Encounter: Payer: Self-pay | Admitting: Internal Medicine

## 2018-03-10 ENCOUNTER — Ambulatory Visit: Payer: BLUE CROSS/BLUE SHIELD | Admitting: Family Medicine

## 2018-03-10 ENCOUNTER — Other Ambulatory Visit: Payer: Self-pay

## 2018-03-10 ENCOUNTER — Encounter: Payer: Self-pay | Admitting: Family Medicine

## 2018-03-10 VITALS — BP 122/78 | HR 77 | Temp 98.2°F | Ht 65.0 in | Wt 204.0 lb

## 2018-03-10 DIAGNOSIS — F321 Major depressive disorder, single episode, moderate: Secondary | ICD-10-CM | POA: Diagnosis not present

## 2018-03-10 DIAGNOSIS — E1165 Type 2 diabetes mellitus with hyperglycemia: Secondary | ICD-10-CM | POA: Diagnosis not present

## 2018-03-10 DIAGNOSIS — R1319 Other dysphagia: Secondary | ICD-10-CM | POA: Insufficient documentation

## 2018-03-10 DIAGNOSIS — F322 Major depressive disorder, single episode, severe without psychotic features: Secondary | ICD-10-CM | POA: Insufficient documentation

## 2018-03-10 DIAGNOSIS — R1310 Dysphagia, unspecified: Secondary | ICD-10-CM | POA: Diagnosis not present

## 2018-03-10 HISTORY — DX: Other dysphagia: R13.19

## 2018-03-10 LAB — POCT GLYCOSYLATED HEMOGLOBIN (HGB A1C)

## 2018-03-10 MED ORDER — SERTRALINE HCL 25 MG PO TABS: 25.0000 mg | ORAL_TABLET | Freq: Every day | ORAL | 1 refills | Status: DC

## 2018-03-10 NOTE — Assessment & Plan Note (Signed)
Patient was encouraged to call the clinic if she ever feels like hurting herself.  Unlikely that she will be a threat to herself since she has never had an active plan.  Started Zoloft 25 mg daily today and told patient to return in two months to discuss medication efficacy.  Patient was offered to meet with behavioral health but declined for now, preferring to try medication first.

## 2018-03-10 NOTE — Progress Notes (Addendum)
Subjective:    Kathleen Larson - 52 y.o. female MRN [id number]  Date of birth: 11/30/1965  HPI  Kathleen Larson is here for a diabetes check up.  Her other concerns include depression and difficulty swallowing.  Diabetes - Hgb A1c > 15 today, last A1c was 13.8 in January 2019 - continues to take metformin, although only taking 500 mg in the morning and 500 mg at night, half of prescribed dose - resistant to the idea of starting insulin, but amenable to taking 1,000 mg metformin BID - willing to see Dr. Raymondo Band to discuss other medication options  Depression - thinks it is due to mother's death in a car accident one year ago - has trouble sleeping every night and often feels sad - is used to being strong for her siblings and does not like relying on others - does have a supportive aunt that she talks to - has thought about hurting herself but has never had a specific plan  Difficulty swallowing - feels like food and liquids get stuck in her upper esophagus - denies feeling like she needs to cough food up - feels like it is related to her stroke - is taking Nexium every other day     Health Maintenance:  Health Maintenance Due  Topic Date Due  . OPHTHALMOLOGY EXAM  01/19/1974  . COLONOSCOPY  01/19/2014    -  reports that she has never smoked. She has never used smokeless tobacco. - Review of Systems: Per HPI. - Past Medical History: Patient Active Problem List   Diagnosis Date Noted  . Current moderate episode of major depressive disorder (HCC) 03/10/2018  . Esophageal dysphagia 03/10/2018  . Mild cognitive impairment 12/27/2017  . Lesion of tongue 10/22/2017  . 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 Tinnitus of both ears 06/21/2017  . Type 2 diabetes mellitus (HCC) 06/21/2017  . Greater trochanteric bursitis of right hip 05/26/2017  . Gait disorder 03/25/2017  . Right thigh pain 09/10/2016  . Neck muscle strain 09/10/2016    . Healthcare maintenance 09/10/2016  . Neck pain on left side 06/19/2016  . Essential hypertension, benign 05/27/2016  . Medical non-compliance   . Prediabetes   . RUE weakness   . Intracranial vascular stenosis   . Cerebrovascular accident (CVA) (HCC)   . Essential hypertension   . Obesity   . Abnormal EKG   . FATIGUE 01/06/2010  . CONSTIPATION 03/22/2009  . HELICOBACTER PYLORI INFECTION, HX OF 01/28/2009  . ALLERGIC RHINITIS 03/02/2008  . ABDOMINAL PAIN 03/02/2008  . Hyperlipemia 07/18/2007   - Medications: reviewed and updated   Objective:   Physical Exam BP 122/78   Pulse 77   Temp 98.2 F (36.8 C) (Oral)   Ht  (1.651 m)   Wt 204 lb (92.5 kg)   LMP 10/14/2012   SpO2 97%   BMI 33.95 kg/m  Gen: NAD, alert, cooperative with exam, well-appearing, pleasant HEENT: NCAT,clear conjunctiva CV: RRR, good S1/S2, no murmur, no edema  Resp: CTABL, no wheezes, non-labored Abd: SNTND, BS present, no guarding or organomegaly Skin: no rashes, normal turgor  Neuro: no gross deficits.  Psych: sad affect, but upbeat at the end of the visit  Depression screen [facility] 2/9 03/10/2018 12/22/2017 12/22/2017  Decreased Interest 2 1 0  Down, Depressed, Hopeless 3 1 0  PHQ - 2 Score 5 2 0  Altered sleeping 3 1 -  Tired, decreased energy 1 1 -  Change in appetite 0 1 -  Feeling bad or failure about yourself  2 1 -  Trouble concentrating 0 1 -  Moving slowly or fidgety/restless 3 1 -  Suicidal thoughts 1 1 -  PHQ-9 Score 15 9 -  Difficult doing work/chores Somewhat difficult Somewhat difficult -   GAD 7 : Generalized Anxiety Score 03/10/2018 12/22/2017  Nervous, Anxious, on Edge 3 1  Control/stop worrying 2 3  Worry too much - different things 1 1  Trouble relaxing 1 3  Restless 1 1  Easily annoyed or irritable 2 1  Afraid - awful might happen 3 1  Total GAD 7 Score 13 11  Anxiety Difficulty Extremely difficult Not difficult at all           Assessment & Plan:   Type 2  diabetes mellitus (HCC) Patient's diabetes is very poorly controlled.  She was counseled on the importance of controlling her diabetes in order to prevent kidney, heart, eye, nerve damage.  I advised her to take metformin 1,000 mg BID, and we made an appointment with Dr. Raymondo Band to discuss other medication options for May 6 at 10:30.  Esophageal dysphagia Advised patient to take Nexium daily.  Made referral to GI for further evaluation.  Current moderate episode of major depressive disorder [facility]) Patient was encouraged to call the clinic if she ever feels like hurting herself.  Unlikely that she will be a threat to herself since she has never had an active plan.  Started Zoloft 25 mg daily today and told patient to return in two months to discuss medication efficacy.  Patient was offered to meet with behavioral health but declined for now, preferring to try medication first.    Lezlie Octave, M.D. 03/10/2018, 11:53 AM PGY-1, [facility]

## 2018-03-10 NOTE — Assessment & Plan Note (Signed)
Advised patient to take Nexium daily.  Made referral to GI for further evaluation.

## 2018-03-10 NOTE — Patient Instructions (Addendum)
It was nice meeting you today Kathleen Larson!  Today, we checked your Hemoglobin A1c, which was >15.  We would like to get your A1c to <7, so we have made an appointment for you to visit with our diabetes expert, Dr. Raymondo Band, on Monday, May 6 at 10:30.  Please take 2 pills of metformin in the morning and 2 in the evening every day.  I have also prescribed an antidepressant medication that is safe and effective, called Zoloft, or sertraline.  Please take one pill per day.  I would like to see you back in two months to talk about how the medication is doing.  I have made a referral to the GI doctors about your swallowing.  You should get a call from them about scheduling an appointment.  If you have any questions or concerns, please feel free to call the clinic.   Be well,  Dr. Frances Furbish

## 2018-03-10 NOTE — Assessment & Plan Note (Signed)
Patient's diabetes is very poorly controlled.  She was counseled on the importance of controlling her diabetes in order to prevent kidney, heart, eye, nerve damage.  I advised her to take metformin 1,000 mg BID, and we made an appointment with Dr. Raymondo Band to discuss other medication options for May 6 at 10:30.

## 2018-03-14 ENCOUNTER — Encounter: Payer: Self-pay | Admitting: Pharmacist

## 2018-03-14 ENCOUNTER — Ambulatory Visit: Payer: BLUE CROSS/BLUE SHIELD | Admitting: Pharmacist

## 2018-03-14 DIAGNOSIS — I10 Essential (primary) hypertension: Secondary | ICD-10-CM

## 2018-03-14 DIAGNOSIS — E785 Hyperlipidemia, unspecified: Secondary | ICD-10-CM

## 2018-03-14 DIAGNOSIS — E119 Type 2 diabetes mellitus without complications: Secondary | ICD-10-CM | POA: Diagnosis not present

## 2018-03-14 MED ORDER — MICROLET NEXT LANCING DEVICE MISC: 1.0000 | Freq: Every morning | 3 refills | Status: DC

## 2018-03-14 MED ORDER — SEMAGLUTIDE(0.25 OR 0.5MG/DOS) 2 MG/1.5ML ~~LOC~~ SOPN: 0.2500 mg | PEN_INJECTOR | SUBCUTANEOUS | 0 refills | Status: DC

## 2018-03-14 MED ORDER — BAYER MICROLET LANCETS MISC: 12 refills | Status: DC

## 2018-03-14 MED ORDER — GLUCOSE BLOOD VI STRP: ORAL_STRIP | 12 refills | Status: DC

## 2018-03-14 MED ORDER — BAYER CONTOUR MONITOR W/DEVICE KIT: 1.0000 | PACK | Freq: Once | 0 refills | Status: AC

## 2018-03-14 NOTE — Progress Notes (Signed)
    S:    Chief Complaint  Patient presents with  . Medication Management    diabetes   Patient arrives ambulating well and in good spirits.  Presents for diabetes evaluation, education, and management at the request of Dr. Lezlie Octave. Patient was referred on 03/10/18.  Patient was last seen by Primary Care Provider on 03/10/18. Patient has prescription insurance through [place].  Patient reports Diabetes was diagnosed in 2019. Patient reports adherence with medications. Current diabetes medications include: metformin 1,000 mg twice daily. Patient reports nocturia: 4-6 times per night. Patient reports not owning a glucometer and not checking her blood glucose at home.  Current hypertension medications include: amlodipine 10 mg daily, Zestoretic 40-25 mg daily  O:  Physical Exam  Constitutional: She appears well-developed and well-nourished.  Vitals reviewed.  Review of Systems  Genitourinary: Positive for frequency.  All other systems reviewed and are negative.  Lab Results  Component Value Date   HGBA1C >15.0 03/10/2018   Vitals:   03/14/18 1054  BP: 126/88  Pulse: 77  SpO2: 98%   A/P: Diabetes newly diagnosed currently uncontrolled. Patient denies hypoglycemic events and is able to verbalize appropriate hypoglycemia management plan. Patient reports adherence with medication. Control is suboptimal due to suboptimal medication regimen. Patient to continue metformin 1,000 mg twice daily. Patient to start Ozempic 0.25 mg weekly injections. Patient counseled on and demonstrated appropriate injection technique, first dose performed in office. Sample of this drug was given to the patient, quantity one pen.  A prescription for a glucometer, lancets, and test strips were sent to the patient's pharmacy.  ASCVD risk greater than 7.5%. Continued Aspirin 325 mg and Continued atorvastatin 80 mg.  Recheck Lipid Panel at next lab evaluation.   Hypertension longstanding diagnosed currently  treated and controlled. Continue amlodipine 10 mg daily and Zestoretic 40-25 mg daily. Patient reports adherence with medication.   Written patient instructions provided.  Total time in face to face counseling 30 minutes.  Follow up in Pharmacist Clinic Visit in three weeks.   Patient seen with Tama Headings, PharmD Candidate and Ladell Pier, PharmD, PGY1 Pharmacy Resident.

## 2018-03-14 NOTE — Assessment & Plan Note (Addendum)
Hypertension longstanding diagnosed currently treated and controlled. Continue amlodipine 10 mg daily and Zestoretic 40-25 mg daily. Patient reports adherence with medication.

## 2018-03-14 NOTE — Assessment & Plan Note (Signed)
ASCVD risk greater than 7.5%. Continued Aspirin 325 mg and Continued atorvastatin 80 mg.  Recheck Lipid Panel at next lab evaluation.

## 2018-03-14 NOTE — Patient Instructions (Addendum)
Thank you for visiting [place] today!   1. CONTINUE metformin 1,000 mg twice a day.  2. START semaglutide (OZEMPIC): Inject 0.25 mg every Monday.   Follow-up with the family medicine pharmacy clinic in 3 weeks.

## 2018-03-14 NOTE — Assessment & Plan Note (Signed)
Diabetes newly diagnosed currently uncontrolled. Patient denies hypoglycemic events and is able to verbalize appropriate hypoglycemia management plan. Patient reports adherence with medication. Control is suboptimal due to suboptimal medication regimen. Patient to continue metformin 1,000 mg twice daily. Patient to start Ozempic 0.25 mg weekly injections. Patient counseled on and demonstrated appropriate injection technique, first dose performed in office. Sample of this drug was given to the patient, quantity one pen.  A prescription for a glucometer, lancets, and test strips were sent to the patient's pharmacy.

## 2018-04-07 ENCOUNTER — Ambulatory Visit: Payer: BLUE CROSS/BLUE SHIELD | Admitting: Pharmacist

## 2018-04-11 ENCOUNTER — Ambulatory Visit: Payer: BLUE CROSS/BLUE SHIELD | Admitting: Pharmacist

## 2018-04-11 ENCOUNTER — Encounter: Payer: Self-pay | Admitting: Pharmacist

## 2018-04-11 DIAGNOSIS — E119 Type 2 diabetes mellitus without complications: Secondary | ICD-10-CM

## 2018-04-11 LAB — POCT GLYCOSYLATED HEMOGLOBIN (HGB A1C): HbA1c, POC (controlled diabetic range): 12.2 % — AB (ref 0.0–7.0)

## 2018-04-11 MED ORDER — SEMAGLUTIDE(0.25 OR 0.5MG/DOS) 2 MG/1.5ML ~~LOC~~ SOPN: 0.2500 mg | PEN_INJECTOR | SUBCUTANEOUS | 1 refills | Status: DC

## 2018-04-11 MED ORDER — SEMAGLUTIDE(0.25 OR 0.5MG/DOS) 2 MG/1.5ML ~~LOC~~ SOPN: 0.2500 mg | PEN_INJECTOR | SUBCUTANEOUS | 0 refills | Status: DC

## 2018-04-11 NOTE — Patient Instructions (Signed)
Thanks for coming in today.   Your blood sugar is much better controlled.   Please continue same dose of Ozempic - 0.25mg  once weekly.   Next visit with Dr. Frances FurbishWinfrey.

## 2018-04-11 NOTE — Assessment & Plan Note (Signed)
Diabetes longstanding and currently much improved based on home CBGs with use of low dose Ozempic (semaglutide). Patient is sleeping much better (less nocturia). Able to verbalize appropriate hypoglycemia management plan. Patient is adherent with medication. Control is suboptimal but much improved.   A1c modestly improved. -Continued GLP-1 Ozempic (generic namesemaglutide) at 0.25mg  weekly dose. -Extensively discussed pathophysiology of DM, recommended lifestyle interventions, dietary effects on glycemic control -Counseled on s/sx of and management of hypoglycemia -Next A1C anticipated 2-3 months

## 2018-04-11 NOTE — Progress Notes (Signed)
    S:     Chief Complaint  Patient presents with  . Medication Management    Diabetes    Patient arrives in good spirits, ambulating without assistance.Collier Flowers.  Presents for diabetes evaluation, education, and management at the request of Dr. Frances FurbishWinfrey. Patient was referred and last seen on 03/10/2018.   Patient reports Diabetes was diagnosed many years ago.    Patient reports adherence with medications.  Current diabetes medications include: ozempic 0.25mg  once weekly.  Current hypertension medications include: Amloidipine and lisionpril HCTZ  Patient denies hypoglycemic events.   Patient-reported exercise habits: limited   Patient reports much dimished nocturia from 6x to 1-2x per night. [place].    O:  Physical Exam  Musculoskeletal: She exhibits no edema.   Review of Systems  Genitourinary: Negative for frequency and urgency.  Endo/Heme/Allergies: Negative for polydipsia.  All other systems reviewed and are negative.    Lab Results  Component Value Date   HGBA1C 12.2 (A) 04/11/2018   Vitals:   04/11/18 1134  BP: 126/80  Pulse: 83  Temp: 98.1 F (36.7 C)  SpO2: 98%    Lipid Panel     Component Value Date/Time   CHOL 277 (H) 05/16/2016 1545   TRIG 115 05/16/2016 1545   HDL 59 05/16/2016 1545   CHOLHDL 4.7 05/16/2016 1545   VLDL 23 05/16/2016 1545   LDLCALC 195 (H) 05/16/2016 1545    Home fasting and preprandial  CBGs: 100-190 with majority of readings in last week < 150.    A/P: Diabetes longstanding and currently much improved based on home CBGs with use of low dose Ozempic (semaglutide). Patient is sleeping much better (less nocturia). Able to verbalize appropriate hypoglycemia management plan. Patient is adherent with medication. Control is suboptimal but much improved.   A1c modestly improved. -Continued GLP-1 Ozempic (generic namesemaglutide) at 0.25mg  weekly dose. -Extensively discussed pathophysiology of DM, recommended lifestyle interventions, dietary  effects on glycemic control -Counseled on s/sx of and management of hypoglycemia -Next A1C anticipated 2-3 months  Written patient instructions provided.  Total time in face to face counseling 25 minutes.   Follow up Pharmacist/PCP after next PCP visit within the next month.   Patient seen with Donnella Biyler Colvard, PharmD, PGY1 Pharmacy Resident.

## 2018-04-12 ENCOUNTER — Telehealth: Payer: Self-pay | Admitting: Family Medicine

## 2018-04-12 NOTE — Telephone Encounter (Signed)
Called patient regarding A1c; patient was told her new A1c in clinic and is amenable to making an appointment with me in a couple months for follow up.

## 2018-04-26 ENCOUNTER — Telehealth: Payer: Self-pay | Admitting: *Deleted

## 2018-04-26 NOTE — Telephone Encounter (Signed)
Pt called- asked if was on Plavix- pt states no, she does not take that - I asked if she took any blood thinner, she stated no.  I asked her to go to her medication and read them off to me- I spelled the generic name for Plavix to her- she spelled it back to me, Clopidogrel- I informed pt that this is a blood thinner, the generic of Plavix and she needs to have an OV not a PV due to this as we may need to Rs her colon since was just started in march  or get a 5 day hold. She was just started on this 01-2018. Made an Ov for 6-20 Thursday with J Zehr PA at 230 pm- informed pt to [facility] 215 pm [facility]  Pt verbalized understanding. I did not yet cancel her 7-16 colon. Informed her this may be done after her OV if needed Hilda LiasMarie PV

## 2018-04-28 ENCOUNTER — Ambulatory Visit (INDEPENDENT_AMBULATORY_CARE_PROVIDER_SITE_OTHER): Payer: BLUE CROSS/BLUE SHIELD | Admitting: Gastroenterology

## 2018-04-28 ENCOUNTER — Encounter: Payer: Self-pay | Admitting: Gastroenterology

## 2018-04-28 VITALS — BP 116/80 | HR 70 | Ht 65.0 in | Wt 206.1 lb

## 2018-04-28 DIAGNOSIS — Z7902 Long term (current) use of antithrombotics/antiplatelets: Secondary | ICD-10-CM

## 2018-04-28 DIAGNOSIS — R1319 Other dysphagia: Secondary | ICD-10-CM

## 2018-04-28 DIAGNOSIS — Z1211 Encounter for screening for malignant neoplasm of colon: Secondary | ICD-10-CM

## 2018-04-28 DIAGNOSIS — R1310 Dysphagia, unspecified: Secondary | ICD-10-CM | POA: Diagnosis not present

## 2018-04-28 NOTE — Progress Notes (Addendum)
04/28/2018 Kathleen Larson [id number] 10-17-1966   HISTORY OF PRESENT ILLNESS:  This is a 52 year old female who is new to our office.  She was scheduled for PV and colonoscopy for screening of CRC.  Was discovered that she is on Plavix, which she has apparently been taking since 05/2016 when she had a CVA.  Was scheduled for OV.  She says that she moves her bowels usually two times per day and they are usually very soft/loose because of the metformin.  No blood in her stools.  She also reports difficulty swallowing both solids and liquids at times.  This seems to be an issue since her stroke and feels like her speech is still affected as well.  Admits to some reflux, but is not on any medications for that at this time.  She is not a very detailed historian.  Also was requested to see patient by PCP, Dr. Frances Larson, for evaluation of dysphagia.   Past Medical History:  Diagnosis Date  . Allergy   . GERD (gastroesophageal reflux disease)   . Hypertension    Past Surgical History:  Procedure Laterality Date  . TUBAL LIGATION      reports that she has never smoked. She has never used smokeless tobacco. She reports that she drinks alcohol. She reports that she does not use drugs. family history includes Asthma in her daughter; Cancer in her maternal grandmother; Stroke in her mother. Allergies  Allergen Reactions  . Shrimp [Shellfish Allergy] Swelling    Lip swelling      Outpatient Encounter Medications as of 04/28/2018  Medication Sig  . amLODipine (NORVASC) 10 MG tablet Take 1 tablet (10 mg total) by mouth daily.  Marland Kitchen aspirin 325 MG tablet Take 1 tablet (325 mg total) by mouth daily.  Marland Kitchen atorvastatin (LIPITOR) 80 MG tablet TAKE ONE TABLET BY MOUTH ONCE DAILY AT 6:00 PM  . BAYER MICROLET LANCETS lancets Use as instructed  . clopidogrel (PLAVIX) 75 MG tablet Take 1 tablet (75 mg total) by mouth daily.  Marland Kitchen glucose blood test strip Use as instructed  . Lancet Devices (MICROLET NEXT  LANCING DEVICE) MISC 1 strip by Does not apply route every morning.  Marland Kitchen lisinopril-hydrochlorothiazide (ZESTORETIC) 20-12.5 MG tablet Take 2 tablets by mouth daily.  . metFORMIN (GLUCOPHAGE) 500 MG tablet Take 2 tablets (1,000 mg total) by mouth 2 (two) times daily with a meal.  . Semaglutide (OZEMPIC) 0.25 or 0.5 MG/DOSE SOPN Inject 0.25 mg into the skin once a week.  . sertraline (ZOLOFT) 25 MG tablet Take 1 tablet (25 mg total) by mouth daily.  . traMADol (ULTRAM) 50 MG tablet Take 1 tablet (50 mg total) by mouth every 6 (six) hours as needed.  . [DISCONTINUED] benzocaine (ORAJEL) 10 % mucosal gel Use as directed 1 application in the mouth or throat as needed for mouth pain.   No facility-administered encounter medications on file as of 04/28/2018.      REVIEW OF SYSTEMS  : All other systems reviewed and negative except where noted in the History of Present Illness.   PHYSICAL EXAM: BP 116/80   Pulse 70   Ht 5\' 5"  (1.651 m)   Wt 206 lb 2 oz (93.5 kg)   LMP 10/14/2012   BMI 34.30 kg/m  General: Well developed black female in no acute distress Head: Normocephalic and atraumatic Eyes:  Sclerae anicteric, conjunctiva pink. Ears: Normal auditory acuity Lungs: Clear throughout to auscultation; no increased WOB. Heart: Regular rate  and rhythm; no M/R/G. Abdomen: Soft, non-distended.  BS present.  Non-tender. Rectal:  Will be done at the time of colonoscopy. Musculoskeletal: Symmetrical with no gross deformities  Skin: No lesions on visible extremities Extremities: No edema  Neurological: Alert oriented x 4, grossly non-focal Psychological:  Alert and cooperative. Normal mood and affect  ASSESSMENT AND PLAN: *Screening colonoscopy:  Already scheduled with Dr. Leone Larson. *Chronic antiplatelet use with Plavix due to CVA in 05/2016:  Hold Plavix for 5 days before procedure - will instruct when and how to resume after procedure. Risks and benefits of procedure including bleeding,  perforation, infection, missed lesions, medication reactions and possible hospitalization or surgery if complications occur explained. Additional rare but real risk of cardiovascular event such as heart attack or ischemia/infarct of other organs off of Plavix explained and need to seek urgent help if this occurs. Will communicate by phone or EMR with patient's prescribing provider, Dr. Jefm Larson/Dr. Frances FurbishWinfrey, that to confirm holding Plavix is reasonable in this case.  *Dysphagia:  Sounds more like oropharyngeal dysphagia.  To both solids and liquids.  ? Result of her stroke since that is when it seemed to begin.  Will check barium esophagram to rule out stricture, dysmotility, etc.   CC:  Kathleen Larson  Ba swallow w/ tiny hiatal hernia  Agree with Kathleen Larson management.  Kathleen Booparl E. Gessner, Larson, Kathleen Larson

## 2018-04-28 NOTE — Patient Instructions (Addendum)
You have been scheduled for a Barium Esophogram [facility]1st floor of the hospital) on 05-05-18 at 9:30 am. Please arrive 15 minutes prior to your appointment for registration. Make certain not to have anything to eat or drink 6 hours prior to your test. If you need to reschedule for any reason, please contact radiology at [phone] to do so. __________________________________________________________________ A barium swallow is an examination that concentrates on views of the esophagus. This tends to be a double contrast exam (barium and two liquids which, when combined, create a gas to distend the wall of the oesophagus) or single contrast (non-ionic iodine based). The study is usually tailored to your symptoms so a good history is essential. Attention is paid during the study to the form, structure and configuration of the esophagus, looking for functional disorders (such as aspiration, dysphagia, achalasia, motility and reflux) EXAMINATION You may be asked to change into a gown, depending on the type of swallow being performed. A radiologist and radiographer will perform the procedure. The radiologist will advise you of the type of contrast selected for your procedure and direct you during the exam. You will be asked to stand, sit or lie in several different positions and to hold a small amount of fluid in your mouth before being asked to swallow while the imaging is performed .In some instances you may be asked to swallow barium coated marshmallows to assess the motility of a solid food bolus. The exam can be recorded as a digital or video fluoroscopy procedure. POST PROCEDURE It will take 1-2 days for the barium to pass through your system. To facilitate this, it is important, unless otherwise directed, to increase your fluids for the next 24-48hrs and to resume your normal diet.  This test typically takes about 30 minutes to  perform. __________________________________________________________________________________  Kathleen QuinYou have been scheduled for a colonoscopy. Please follow written instructions given to you at your visit today.  Please pick up your prep supplies at the pharmacy within the next 1-3 days. If you use inhalers (even only as needed), please bring them with you on the day of your procedure. Your physician has requested that you go to www.startemmi.com and enter the access code given to you at your visit today. This web site gives a general overview about your procedure. However, you should still follow specific instructions given to you by our office regarding your preparation for the procedure.

## 2018-05-05 ENCOUNTER — Ambulatory Visit (HOSPITAL_COMMUNITY)
Admission: RE | Admit: 2018-05-05 | Discharge: 2018-05-05 | Disposition: A | Discharge status: Home / Self Care | Payer: BLUE CROSS/BLUE SHIELD | Source: Ambulatory Visit | Attending: Gastroenterology | Admitting: Gastroenterology

## 2018-05-05 DIAGNOSIS — R1319 Other dysphagia: Secondary | ICD-10-CM

## 2018-05-05 DIAGNOSIS — R1310 Dysphagia, unspecified: Secondary | ICD-10-CM | POA: Diagnosis present

## 2018-05-05 DIAGNOSIS — K449 Diaphragmatic hernia without obstruction or gangrene: Secondary | ICD-10-CM | POA: Insufficient documentation

## 2018-05-24 ENCOUNTER — Encounter: Payer: Self-pay | Admitting: Internal Medicine

## 2018-05-24 ENCOUNTER — Ambulatory Visit (AMBULATORY_SURGERY_CENTER): Payer: BLUE CROSS/BLUE SHIELD | Admitting: Internal Medicine

## 2018-05-24 VITALS — BP 132/91 | HR 68 | Temp 98.6°F | Resp 16 | Ht 65.0 in | Wt 206.0 lb

## 2018-05-24 DIAGNOSIS — Z1211 Encounter for screening for malignant neoplasm of colon: Secondary | ICD-10-CM | POA: Diagnosis present

## 2018-05-24 MED ORDER — SODIUM CHLORIDE 0.9 % IV SOLN: 500.0000 mL | Freq: Once | INTRAVENOUS | Status: DC

## 2018-05-24 MED ORDER — FAMOTIDINE 20 MG PO TABS: 20.0000 mg | ORAL_TABLET | Freq: Two times a day (BID) | ORAL | 3 refills | Status: DC

## 2018-05-24 NOTE — Progress Notes (Signed)
Spontaneous respirations throughout. VSS. Resting comfortably. To PACU on room air. Report to  RN. 

## 2018-05-24 NOTE — Patient Instructions (Addendum)
   No polyps or cancer seen. Next routine colonoscopy or other screening test in 10 years - 2029  I did see internal hemorrhoids which are normal structures. If you have hemorrhoid problems (swelling, itching, bleeding) I am able to treat those with an in-office procedure. If you like, please call my office at [phone] to schedule an appointment and I can evaluate you further.  Try cutting large pills in half so they are easier to swallow.  I prescribed famotidine twice a day for heartburn. Follow a GERD diet also.  If you do not get good results from this regarding your heartburn make an appointment to see Doug SouJessica Zehr, PA-C or me.  I appreciate the opportunity to care for you. Iva Booparl E. Samel Bruna, MD, [facility]  **Handout given on GERD**  YOU HAD AN ENDOSCOPIC PROCEDURE TODAY: Refer to the procedure report and other information in the discharge instructions given to you for any specific questions about what was found during the examination. If this information does not answer your questions, please call [place] office at [phone] to clarify.   YOU SHOULD EXPECT: Some feelings of bloating in the abdomen. Passage of more gas than usual. Walking can help get rid of the air that was put into your GI tract during the procedure and reduce the bloating. If you had a lower endoscopy (such as a colonoscopy or flexible sigmoidoscopy) you may notice spotting of blood in your stool or on the toilet paper. Some abdominal soreness may be present for a day or two, also.  DIET: Your first meal following the procedure should be a light meal and then it is ok to progress to your normal diet. A half-sandwich or bowl of soup is an example of a good first meal. Heavy or fried foods are harder to digest and may make you feel nauseous or bloated. Drink plenty of fluids but you should avoid alcoholic beverages for 24 hours. If you had a esophageal dilation, please see attached instructions for diet.    ACTIVITY:  Your care partner should take you home directly after the procedure. You should plan to take it easy, moving slowly for the rest of the day. You can resume normal activity the day after the procedure however YOU SHOULD NOT DRIVE, use power tools, machinery or perform tasks that involve climbing or major physical exertion for 24 hours (because of the sedation medicines used during the test).   SYMPTOMS TO REPORT IMMEDIATELY: A gastroenterologist can be reached at any hour. Please call [phone]  for any of the following symptoms:  Following lower endoscopy (colonoscopy, flexible sigmoidoscopy) Excessive amounts of blood in the stool  Significant tenderness, worsening of abdominal pains  Swelling of the abdomen that is new, acute  Fever of 100 or higher    FOLLOW UP:  If any biopsies were taken you will be contacted by phone or by letter within the next 1-3 weeks. Call [phone]  if you have not heard about the biopsies in 3 weeks.  Please also call with any specific questions about appointments or follow up tests.

## 2018-05-24 NOTE — Op Note (Addendum)
[facility] Patient Name: Kathleen MadeiraConstance Larson Procedure Date: 05/24/2018 12:23 PM MRN: [id number] Endoscopist: Iva Booparl E Gessner , MD Age: [age] Referring MD:  Date of Birth: 1966/04/08 Gender: Female Account #: [id number] Procedure:                Colonoscopy Indications:              Screening for colorectal malignant neoplasm, This                            is the patient's first colonoscopy Medicines:                Propofol per Anesthesia, Monitored Anesthesia Care Procedure:                Pre-Anesthesia Assessment:                           - Prior to the procedure, a History and Physical                            was performed, and patient medications and                            allergies were reviewed. The patient's tolerance of                            previous anesthesia was also reviewed. The risks                            and benefits of the procedure and the sedation                            options and risks were discussed with the patient.                            All questions were answered, and informed consent                            was obtained. Prior Anticoagulants: The patient                            last took Plavix (clopidogrel) 5 days prior to the                            procedure. ASA Grade Assessment: II - A patient                            with mild systemic disease. After reviewing the                            risks and benefits, the patient was deemed in                            satisfactory condition to undergo the procedure.  After obtaining informed consent, the colonoscope                            was passed under direct vision. Throughout the                            procedure, the patient's blood pressure, pulse, and                            oxygen saturations were monitored continuously. The                            Colonoscope was introduced through the anus and       advanced to the the cecum, identified by                            appendiceal orifice and ileocecal valve. The                            ileocecal valve, appendiceal orifice, and rectum                            were photographed. The quality of the bowel                            preparation was excellent. The colonoscopy was                            performed without difficulty. The patient tolerated                            the procedure well. The bowel preparation used was                            Miralax. Scope In: 12:31:41 PM Scope Out: 12:44:12 PM Scope Withdrawal Time: 0 hours 10 minutes 37 seconds  Total Procedure Duration: 0 hours 12 minutes 31 seconds  Findings:                 The perianal and digital rectal examinations were                            normal.                           The colon (entire examined portion) appeared normal.                           Internal hemorrhoids were found during retroflexion.                           The exam was otherwise without abnormality on                            direct and retroflexion views. Complications:  No immediate complications. Estimated blood loss:                            None. Estimated Blood Loss:     Estimated blood loss: none. Impression:               - The entire examined colon is normal.                           - Internal hemorrhoids in rectum                           - The examination was otherwise normal on direct                            and retroflexion views.                           - No specimens collected. Recommendation:           - Repeat colonoscopy in 10 years for screening                            purposes.                           - Resume Plavix (clopidogrel) today at prior dose.                           - Patient has a contact number available for                            emergencies. The signs and symptoms of potential                             delayed complications were discussed with the                            patient. Return to normal activities tomorrow.                            Written discharge instructions were provided to the                            patient.                           - Resume previous diet.                           - Continue present medications.                           - Start famotidine 20 mg bid for heartburm/reflux                           I reviewed Ba swallow (ok except small hiatal  hernia) w/ her                           She has pill dysphagia - advised to cut large pills                            in half                           GERD diet                           If she fails these measures return to Doug Sou,                            PA-C or me Iva Boop, MD 05/24/2018 12:49:37 PM This report has been signed electronically.

## 2018-05-24 NOTE — Progress Notes (Signed)
I have reviewed the patient's medical history in detail and updated the computerized patient record.

## 2018-05-25 ENCOUNTER — Telehealth: Payer: Self-pay | Admitting: *Deleted

## 2018-05-25 NOTE — Telephone Encounter (Signed)
  Follow up Call-  Call back number 05/24/2018  Post procedure Call Back phone  # [phone]  Permission to leave phone message Yes  Some recent data might be hidden     Patient questions:  Do you have a fever, pain , or abdominal swelling? No. Pain Score  0 *  Have you tolerated food without any problems? Yes.    Have you been able to return to your normal activities? Yes.    Do you have any questions about your discharge instructions: Diet   No. Medications  No. Follow up visit  No.  Do you have questions or concerns about your Care? No.  Actions: * If pain score is 4 or above: No action needed, pain <4.

## 2018-08-04 ENCOUNTER — Telehealth: Payer: Self-pay | Admitting: Family Medicine

## 2018-08-04 NOTE — Telephone Encounter (Signed)
Pt calling to make an appt with Koval for October, Koval's schedule is completely blocked all of October and pt wants to see him before November. Please call pt back to set something up.

## 2018-08-08 NOTE — Telephone Encounter (Signed)
Contacted pt and scheduled her to meet with Dr. Raymondo Band on 09/12/18 due to the 10th being full.  Told pt she was welcome to call and see if there were any cancellations in October to see if she could get in earlier.  She also wanted to get Dr. Raymondo Band to send in refills for her Ozempic and for the strips and needles. Routing to PCP as well as Dr. Gibson Ramp Rumple, [place] D, [place]

## 2018-08-10 ENCOUNTER — Other Ambulatory Visit (HOSPITAL_COMMUNITY): Payer: Self-pay | Admitting: Family Medicine

## 2018-08-10 DIAGNOSIS — E119 Type 2 diabetes mellitus without complications: Secondary | ICD-10-CM

## 2018-08-10 MED ORDER — SEMAGLUTIDE(0.25 OR 0.5MG/DOS) 2 MG/1.5ML ~~LOC~~ SOPN
0.2500 mg | PEN_INJECTOR | SUBCUTANEOUS | 1 refills | Status: DC
Start: 2018-08-10 — End: 2018-08-15

## 2018-08-10 MED ORDER — BAYER MICROLET LANCETS MISC
12 refills | Status: DC
Start: 2018-08-10 — End: 2018-08-15

## 2018-08-10 MED ORDER — MICROLET NEXT LANCING DEVICE MISC
1.0000 | Freq: Every morning | 3 refills | Status: DC
Start: 2018-08-10 — End: 2018-08-15

## 2018-08-10 NOTE — Telephone Encounter (Signed)
Pt has been informed. Ariq Khamis T Emelynn Rance, CMA  

## 2018-08-10 NOTE — Telephone Encounter (Signed)
Refills sent to pharmacy. 

## 2018-08-15 ENCOUNTER — Other Ambulatory Visit: Payer: Self-pay

## 2018-08-15 ENCOUNTER — Other Ambulatory Visit: Payer: Self-pay | Admitting: Family Medicine

## 2018-08-15 ENCOUNTER — Encounter: Payer: Self-pay | Admitting: Family Medicine

## 2018-08-15 ENCOUNTER — Ambulatory Visit: Payer: BLUE CROSS/BLUE SHIELD | Admitting: Family Medicine

## 2018-08-15 VITALS — BP 122/84 | HR 91 | Temp 97.7°F | Ht 65.0 in | Wt 209.0 lb

## 2018-08-15 DIAGNOSIS — E119 Type 2 diabetes mellitus without complications: Secondary | ICD-10-CM

## 2018-08-15 DIAGNOSIS — F329 Major depressive disorder, single episode, unspecified: Secondary | ICD-10-CM

## 2018-08-15 DIAGNOSIS — Z23 Encounter for immunization: Secondary | ICD-10-CM

## 2018-08-15 DIAGNOSIS — H9313 Tinnitus, bilateral: Secondary | ICD-10-CM

## 2018-08-15 DIAGNOSIS — I10 Essential (primary) hypertension: Secondary | ICD-10-CM | POA: Diagnosis not present

## 2018-08-15 DIAGNOSIS — E669 Obesity, unspecified: Secondary | ICD-10-CM

## 2018-08-15 DIAGNOSIS — I63519 Cerebral infarction due to unspecified occlusion or stenosis of unspecified middle cerebral artery: Secondary | ICD-10-CM | POA: Diagnosis not present

## 2018-08-15 DIAGNOSIS — H9312 Tinnitus, left ear: Secondary | ICD-10-CM

## 2018-08-15 DIAGNOSIS — F32A Depression, unspecified: Secondary | ICD-10-CM

## 2018-08-15 LAB — POCT GLYCOSYLATED HEMOGLOBIN (HGB A1C): HBA1C, POC (CONTROLLED DIABETIC RANGE): 6.2 % (ref 0.0–7.0)

## 2018-08-15 MED ORDER — GLUCOSE BLOOD VI STRP: ORAL_STRIP | 12 refills | Status: DC

## 2018-08-15 MED ORDER — ATORVASTATIN CALCIUM 80 MG PO TABS: ORAL_TABLET | ORAL | 2 refills | Status: DC

## 2018-08-15 MED ORDER — SERTRALINE HCL 50 MG PO TABS: 25.0000 mg | ORAL_TABLET | Freq: Every day | ORAL | 3 refills | Status: DC

## 2018-08-15 MED ORDER — METFORMIN HCL 500 MG PO TABS: 1000.0000 mg | ORAL_TABLET | Freq: Two times a day (BID) | ORAL | 3 refills | Status: DC

## 2018-08-15 MED ORDER — AMLODIPINE BESYLATE 10 MG PO TABS: 10.0000 mg | ORAL_TABLET | Freq: Every day | ORAL | 1 refills | Status: DC

## 2018-08-15 MED ORDER — SEMAGLUTIDE(0.25 OR 0.5MG/DOS) 2 MG/1.5ML ~~LOC~~ SOPN: 0.2500 mg | PEN_INJECTOR | SUBCUTANEOUS | 1 refills | Status: DC

## 2018-08-15 MED ORDER — BAYER MICROLET LANCETS MISC: 12 refills | Status: DC

## 2018-08-15 MED ORDER — MICROLET NEXT LANCING DEVICE MISC: 1.0000 | Freq: Every morning | 3 refills | Status: DC

## 2018-08-15 MED ORDER — LISINOPRIL-HYDROCHLOROTHIAZIDE 20-12.5 MG PO TABS: 2.0000 | ORAL_TABLET | Freq: Every day | ORAL | 2 refills | Status: DC

## 2018-08-15 MED ORDER — ASPIRIN 325 MG PO TABS: 325.0000 mg | ORAL_TABLET | Freq: Every day | ORAL | 8 refills | Status: DC

## 2018-08-15 MED ORDER — FAMOTIDINE 20 MG PO TABS: 20.0000 mg | ORAL_TABLET | Freq: Two times a day (BID) | ORAL | 3 refills | Status: DC

## 2018-08-15 MED ORDER — TRAMADOL HCL 50 MG PO TABS: 50.0000 mg | ORAL_TABLET | Freq: Four times a day (QID) | ORAL | 3 refills | Status: DC | PRN

## 2018-08-15 NOTE — Patient Instructions (Addendum)
It was nice seeing you today Ms. Gadsby!  Congratulations on your diabetes control!!!  Your A1c is now 6.2, down from 12.2.  For the next week, you are going to work on cutting down your ginger ale by one glass per day.  Then, the week after, you are going to cut it down by two glasses.  Walk 30 minutes three times per week.  Then increase this to five times per week after one month.  I would like to see you back in six weeks.  If you have any questions or concerns, please feel free to call the clinic.   Be well,  Dr. Frances Furbish  DASH Eating Plan DASH stands for "Dietary Approaches to Stop Hypertension." The DASH eating plan is a healthy eating plan that has been shown to reduce high blood pressure (hypertension). It may also reduce your risk for type 2 diabetes, heart disease, and stroke. The DASH eating plan may also help with weight loss. What are tips for following this plan? General guidelines  Avoid eating more than 2,300 mg (milligrams) of salt (sodium) a day. If you have hypertension, you may need to reduce your sodium intake to 1,500 mg a day.  Limit alcohol intake to no more than 1 drink a day for nonpregnant women and 2 drinks a day for men. One drink equals 12 oz of beer, 5 oz of wine, or 1 oz of hard liquor.  Work with your health care provider to maintain a healthy body weight or to lose weight. Ask what an ideal weight is for you.  Get at least 30 minutes of exercise that causes your heart to beat faster (aerobic exercise) most days of the week. Activities may include walking, swimming, or biking.  Work with your health care provider or diet and nutrition specialist (dietitian) to adjust your eating plan to your individual calorie needs. Reading food labels  Check food labels for the amount of sodium per serving. Choose foods with less than 5 percent of the Daily Value of sodium. Generally, foods with less than 300 mg of sodium per serving fit into this eating plan.  To  find whole grains, look for the word "whole" as the first word in the ingredient list. Shopping  Buy products labeled as "low-sodium" or "no salt added."  Buy fresh foods. Avoid canned foods and premade or frozen meals. Cooking  Avoid adding salt when cooking. Use salt-free seasonings or herbs instead of table salt or sea salt. Check with your health care provider or pharmacist before using salt substitutes.  Do not fry foods. Cook foods using healthy methods such as baking, boiling, grilling, and broiling instead.  Cook with heart-healthy oils, such as olive, canola, soybean, or sunflower oil. Meal planning   Eat a balanced diet that includes: ? 5 or more servings of fruits and vegetables each day. At each meal, try to fill half of your plate with fruits and vegetables. ? Up to 6-8 servings of whole grains each day. ? Less than 6 oz of lean meat, poultry, or fish each day. A 3-oz serving of meat is about the same size as a deck of cards. One egg equals 1 oz. ? 2 servings of low-fat dairy each day. ? A serving of nuts, seeds, or beans 5 times each week. ? Heart-healthy fats. Healthy fats called Omega-3 fatty acids are found in foods such as flaxseeds and coldwater fish, like sardines, salmon, and mackerel.  Limit how much you eat of the following: ?  Canned or prepackaged foods. ? Food that is high in trans fat, such as fried foods. ? Food that is high in saturated fat, such as fatty meat. ? Sweets, desserts, sugary drinks, and other foods with added sugar. ? Full-fat dairy products.  Do not salt foods before eating.  Try to eat at least 2 vegetarian meals each week.  Eat more home-cooked food and less restaurant, buffet, and fast food.  When eating at a restaurant, ask that your food be prepared with less salt or no salt, if possible. What foods are recommended? The items listed may not be a complete list. Talk with your dietitian about what dietary choices are best for  you. Grains Whole-grain or whole-wheat bread. Whole-grain or whole-wheat pasta. Brown rice. Orpah Cobb. Bulgur. Whole-grain and low-sodium cereals. Pita bread. Low-fat, low-sodium crackers. Whole-wheat flour tortillas. Vegetables Fresh or frozen vegetables (raw, steamed, roasted, or grilled). Low-sodium or reduced-sodium tomato and vegetable juice. Low-sodium or reduced-sodium tomato sauce and tomato paste. Low-sodium or reduced-sodium canned vegetables. Fruits All fresh, dried, or frozen fruit. Canned fruit in natural juice (without added sugar). Meat and other protein foods Skinless chicken or [place]. Ground chicken or [place]. Pork with fat trimmed off. Fish and seafood. Egg whites. Dried beans, peas, or lentils. Unsalted nuts, nut butters, and seeds. Unsalted canned beans. Lean cuts of beef with fat trimmed off. Low-sodium, lean deli meat. Dairy Low-fat (1%) or fat-free (skim) milk. Fat-free, low-fat, or reduced-fat cheeses. Nonfat, low-sodium ricotta or cottage cheese. Low-fat or nonfat yogurt. Low-fat, low-sodium cheese. Fats and oils Soft margarine without trans fats. Vegetable oil. Low-fat, reduced-fat, or light mayonnaise and salad dressings (reduced-sodium). Canola, safflower, olive, soybean, and sunflower oils. Avocado. Seasoning and other foods Herbs. Spices. Seasoning mixes without salt. Unsalted popcorn and pretzels. Fat-free sweets. What foods are not recommended? The items listed may not be a complete list. Talk with your dietitian about what dietary choices are best for you. Grains Baked goods made with fat, such as croissants, muffins, or some breads. Dry pasta or rice meal packs. Vegetables Creamed or fried vegetables. Vegetables in a cheese sauce. Regular canned vegetables (not low-sodium or reduced-sodium). Regular canned tomato sauce and paste (not low-sodium or reduced-sodium). Regular tomato and vegetable juice (not low-sodium or reduced-sodium). Rosita Fire.  Olives. Fruits Canned fruit in a light or heavy syrup. Fried fruit. Fruit in cream or butter sauce. Meat and other protein foods Fatty cuts of meat. Ribs. Fried meat. Tomasa Blase. Sausage. Bologna and other processed lunch meats. Salami. Fatback. Hotdogs. Bratwurst. Salted nuts and seeds. Canned beans with added salt. Canned or smoked fish. Whole eggs or egg yolks. Chicken or [place] with skin. Dairy Whole or 2% milk, cream, and half-and-half. Whole or full-fat cream cheese. Whole-fat or sweetened yogurt. Full-fat cheese. Nondairy creamers. Whipped toppings. Processed cheese and cheese spreads. Fats and oils Butter. Stick margarine. Lard. Shortening. Ghee. Bacon fat. Tropical oils, such as coconut, palm kernel, or palm oil. Seasoning and other foods Salted popcorn and pretzels. Onion salt, garlic salt, seasoned salt, table salt, and sea salt. Worcestershire sauce. Tartar sauce. Barbecue sauce. Teriyaki sauce. Soy sauce, including reduced-sodium. Steak sauce. Canned and packaged gravies. Fish sauce. Oyster sauce. Cocktail sauce. Horseradish that you find on the shelf. Ketchup. Mustard. Meat flavorings and tenderizers. Bouillon cubes. Hot sauce and Tabasco sauce. Premade or packaged marinades. Premade or packaged taco seasonings. Relishes. Regular salad dressings. Where to find more information:  National Heart, Lung, and Blood Institute: PopSteam.is  American Heart Association: www.heart.org Summary  The DASH eating plan is a healthy eating plan that has been shown to reduce high blood pressure (hypertension). It may also reduce your risk for type 2 diabetes, heart disease, and stroke.  With the DASH eating plan, you should limit salt (sodium) intake to 2,300 mg a day. If you have hypertension, you may need to reduce your sodium intake to 1,500 mg a day.  When on the DASH eating plan, aim to eat more fresh fruits and vegetables, whole grains, lean proteins, low-fat dairy, and heart-healthy  fats.  Work with your health care provider or diet and nutrition specialist (dietitian) to adjust your eating plan to your individual calorie needs. This information is not intended to replace advice given to you by your health care provider. Make sure you discuss any questions you have with your health care provider. Document Released: 10/15/2011 Document Revised: 10/19/2016 Document Reviewed: 10/19/2016 Elsevier Interactive Patient Education  [employer].

## 2018-08-15 NOTE — Assessment & Plan Note (Signed)
Approved per patient report.  Will increase Zoloft dose to 50 mg daily since patient finds this helpful and feels that she would benefit from a higher dose.  Will reevaluate in about 6 weeks.

## 2018-08-15 NOTE — Assessment & Plan Note (Signed)
A1c is vastly improved today, down from 12.2-6.2.  Patient was congratulated.  Will continue current regimen since it is working well for her.

## 2018-08-15 NOTE — Assessment & Plan Note (Signed)
Counseled patient extensively on lifestyle modifications.  Goals are for patient to reduce ginger ale use by one glass per day each week.  She is also going to incorporate more lean protein and less fried foods and carbohydrates in her diet.  She is also planning to walk 3 times a week for 30 minutes each.  Handout on the DASH diet was given.

## 2018-08-15 NOTE — Assessment & Plan Note (Signed)
History is not consistent with a purely vascular or sensorineural cause for her tinnitus.  Will reevaluate at next visit, and also advised patient to continue working on getting hearing aids if these are affordable for her, since this may alleviate her symptoms.

## 2018-08-15 NOTE — Progress Notes (Signed)
Subjective:    Kathleen Larson - 52 y.o. female MRN [id number]  Date of birth: 1966/01/23  HPI  Kathleen Larson is here for follow up of diabetes and depression.  She also wants to discuss her weight gain and tinnitus.  Type 2 diabetes mellitus -Patient continues to take Ozempic and metformin and has had no issues with these medications -No symptoms of hypoglycemia - Blood sugars have been mostly in the 80s and 90s in the morning and in the evening -Patient has been eating [place] food and other fast food and drinking a 2 L of ginger ale every 3 days  Depression -Since patient has been taking Zoloft 25 mg and has found this to be helpful -Feels like she is better able to cope with the grief of losing her mother -Would like her Zoloft dose increased  Weight gain -Patient has gained about 18 pounds since her visit in May -Patient eats fast food and drinks ginger ale frequently, and she knows that the ginger ale is likely not good for her, but it does help relieve her acid reflux -Does try to walk most days  Tinnitus -Has been a problem since she had her stroke in 2017 -Has been predominantly on the left side -The sometimes is pulsatile, sometimes is constant -Sometimes is lower pitched, sometimes with higher pitch -Has already been evaluated by audiology, and does have hearing loss.  She has been unable to get a hearing aid yet, but is working on this.     Health Maintenance:  -Would like flu shot today Health Maintenance Due  Topic Date Due  . OPHTHALMOLOGY EXAM  01/19/1974  . FOOT EXAM  06/21/2018    -  reports that she has never smoked. She has never used smokeless tobacco. - Review of Systems: Per HPI. - Past Medical History: Patient Active Problem List   Diagnosis Date Noted  . Depression, controlled 03/10/2018  . Esophageal dysphagia 03/10/2018  . Mild cognitive impairment 12/27/2017  . Lesion of tongue 10/22/2017  . 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 Tinnitus of both ears 06/21/2017  . Type 2 diabetes mellitus (HCC) 06/21/2017  . Greater trochanteric bursitis of right hip 05/26/2017  . Gait disorder 03/25/2017  . Right thigh pain 09/10/2016  . Neck muscle strain 09/10/2016  . Long term current use of antithrombotics/antiplatelets 09/10/2016  . Neck pain on left side 06/19/2016  . Essential hypertension, benign 05/27/2016  . Medical non-compliance   . RUE weakness   . Intracranial vascular stenosis   . Cerebrovascular accident (CVA) (HCC)   . Essential hypertension   . Obesity   . Abnormal EKG   . FATIGUE 01/06/2010  . CONSTIPATION 03/22/2009  . HELICOBACTER PYLORI INFECTION, HX OF 01/28/2009  . ALLERGIC RHINITIS 03/02/2008  . Hyperlipemia 07/18/2007   - Medications: reviewed and updated   Objective:   Physical Exam BP 122/84   Pulse 91   Temp 97.7 F (36.5 C) (Oral)   Ht 5\' 5"  (1.651 m)   Wt 209 lb (94.8 kg)   LMP 10/14/2012   SpO2 96%   BMI 34.78 kg/m  Gen: NAD, alert, cooperative with exam, well-appearing, pleasant, obese CV: RRR, good S1/S2, no murmur, no edema Resp: CTABL, no wheezes, non-labored Abd: SNTND, BS present, no guarding or organomegaly Skin: no rashes, normal turgor  Neuro: no gross deficits.  Psych: good insight, alert and oriented        Assessment & Plan:  Type 2 diabetes mellitus (HCC) A1c is vastly improved today, down from 12.2-6.2.  Patient was congratulated.  Will continue current regimen since it is working well for her.  Depression, controlled Approved per patient report.  Will increase Zoloft dose to 50 mg daily since patient finds this helpful and feels that she would benefit from a higher dose.  Will reevaluate in about 6 weeks.  Obesity Counseled patient extensively on lifestyle modifications.  Goals are for patient to reduce ginger ale use by one glass per day each week.  She is also going to incorporate more lean protein and less  fried foods and carbohydrates in her diet.  She is also planning to walk 3 times a week for 30 minutes each.  Handout on the DASH diet was given.  Tinnitus of both ears History is not consistent with a purely vascular or sensorineural cause for her tinnitus.  Will reevaluate at next visit, and also advised patient to continue working on getting hearing aids if these are affordable for her, since this may alleviate her symptoms.    Lezlie Octave, M.D. 08/15/2018, 5:19 PM PGY-2, [facility]

## 2018-08-18 ENCOUNTER — Other Ambulatory Visit: Payer: Self-pay | Admitting: *Deleted

## 2018-08-18 DIAGNOSIS — I10 Essential (primary) hypertension: Secondary | ICD-10-CM

## 2018-08-18 MED ORDER — TRAMADOL HCL 50 MG PO TABS: 50.0000 mg | ORAL_TABLET | Freq: Four times a day (QID) | ORAL | 3 refills | Status: DC | PRN

## 2018-08-18 MED ORDER — LISINOPRIL-HYDROCHLOROTHIAZIDE 20-12.5 MG PO TABS: 2.0000 | ORAL_TABLET | Freq: Every day | ORAL | 2 refills | Status: DC

## 2018-09-12 ENCOUNTER — Ambulatory Visit (INDEPENDENT_AMBULATORY_CARE_PROVIDER_SITE_OTHER): Payer: BLUE CROSS/BLUE SHIELD | Admitting: Pharmacist

## 2018-09-12 ENCOUNTER — Other Ambulatory Visit: Payer: Self-pay

## 2018-09-12 ENCOUNTER — Encounter: Payer: Self-pay | Admitting: Pharmacist

## 2018-09-12 DIAGNOSIS — E785 Hyperlipidemia, unspecified: Secondary | ICD-10-CM

## 2018-09-12 DIAGNOSIS — I10 Essential (primary) hypertension: Secondary | ICD-10-CM

## 2018-09-12 DIAGNOSIS — E119 Type 2 diabetes mellitus without complications: Secondary | ICD-10-CM

## 2018-09-12 MED ORDER — TRAMADOL HCL 50 MG PO TABS: 50.0000 mg | ORAL_TABLET | Freq: Four times a day (QID) | ORAL | 3 refills | Status: DC | PRN

## 2018-09-12 MED ORDER — EMPAGLIFLOZIN 10 MG PO TABS: 10.0000 mg | ORAL_TABLET | Freq: Every day | ORAL | 3 refills | Status: DC

## 2018-09-12 NOTE — Assessment & Plan Note (Signed)
Hypertension longstanding on 3 drugs at max doses and currently above goal of > 130/80. Patient reports adherence with medication. Control is suboptimal due to strong family history of Hypertension. Denies high salt diet. Exercise is limited however she has lost weight recently. Addition of Jardiance (empagliflozin) may help significantly with Blood Pressure.

## 2018-09-12 NOTE — Assessment & Plan Note (Signed)
ASCVD risk - secondary prevention in patient with history or CVA and DM .  Needs updated lipid panel at next blood draw.  -Continued aspirin 325 mg  -Continued atorvastatin 80 mg.  Consider ezetimibe tx addition of LDL remains elevated.

## 2018-09-12 NOTE — Progress Notes (Signed)
    S:     Chief Complaint  Patient presents with  . Medication Management    Diabetes, Htn    Patient arrives in good spirits and without complaint, ambulating without assistance.  Presents for diabetes evaluation, education, and management at the request of Dr. Frances Furbish. Patient was referred in May 2019.  Patient was last seen by Primary Care Provider on October 7th. .   Patient reports Diabetes has been a long-term diagnosis.  Insurance coverage/medication affordability: BCBS  Patient reports adherence with medications.  Current diabetes medications include: metformin 1000mg  BID (some occasional diarrhea), Ozempic 0.25mg  daily. Current hypertension medications include: amlodipine 10mg , lisinopril 20/12.5 (take 2 daily).   Patient denies hypoglycemic events.  Patient reported dietary habits: Avoiding high salt foods    Patient denies nocturia.    O:  Physical Exam  Constitutional: She appears well-developed and well-nourished.  Musculoskeletal: She exhibits no edema.  Bilateral lower extremity  Vitals reviewed.  Review of Systems  Psychiatric/Behavioral: The patient is not nervous/anxious.        Reports less anxiousness since starting sertraline  All other systems reviewed and are negative.   Lab Results  Component Value Date   HGBA1C 6.2 08/15/2018   Vitals:   09/12/18 1032 09/12/18 1039  BP: (!) 150/85 (!) 146/92  Pulse: 75   SpO2: 98%     Home fasting CBG: reported to be 90s- low 100s with occasional reading in 80s - denies fasting readings 200 or higher.  Clinical ASCVD: Yes    A/P: Diabetes longstanding and much improved on Ozempic once weekly injection.  Current therapy is well tolerated however she reports occasional (monthly).  Denies hypoglycemic sx.  -Continued GLP-1 Ozempic (generic name semaglutide) at 0.25mg  once weekly.   -Initiated a trial of SGLT2-I Jardiance (generic name empagliflozin). Counseled on sick day rules for dehydration and UTI  and yeast infections. . -Extensively discussed pathophysiology of DM, recommended lifestyle interventions, dietary effects on glycemic control -Counseled on s/sx of and management of hypoglycemia   ASCVD risk - secondary prevention in patient with history or CVA and DM .  Needs updated lipid panel at next blood draw.  -Continued aspirin 325 mg  -Continued atorvastatin 80 mg.  Consider ezetimibe tx addition of LDL remains elevated.   Hypertension longstanding on 3 drugs at max doses and currently above goal of > 130/80. Patient reports adherence with medication. Control is suboptimal due to strong family history of Hypertension. Denies high salt diet. Exercise is limited however she has lost weight recently. Addition of Jardiance (empagliflozin) may help significantly with Blood Pressure.    Written patient instructions provided.  Total time in face to face counseling 30 minutes.   Follow up Pharmacist PRN and with PCP on 11/18   Patient seen with Belva Agee, PharmD Candidate.

## 2018-09-12 NOTE — Assessment & Plan Note (Signed)
Diabetes longstanding and much improved on Ozempic once weekly injection.  Current therapy is well tolerated however she reports occasional (monthly).  Denies hypoglycemic sx.  -Continued GLP-1 Ozempic (generic name semaglutide) at 0.25mg  once weekly.   -Initiated a trial of SGLT2-I Jardiance (generic name empagliflozin). Counseled on sick day rules for dehydration and UTI and yeast infections. . -Extensively discussed pathophysiology of DM, recommended lifestyle interventions, dietary effects on glycemic control -Counseled on s/sx of and management of hypoglycemia

## 2018-09-12 NOTE — Telephone Encounter (Signed)
Patient was in office to see Dr Raymondo Band. Asked RN about Tramadol refill. Last refill was set to "no print". Please re-send.  Ples Specter, RN [facility] RN)

## 2018-09-12 NOTE — Patient Instructions (Signed)
Great to see you today!  Continue all current medications unchanged.   Please start Jardiance 10mg  once daily.   Keep your appointment with Dr. Jerline Pain.  It has been great working with you.

## 2018-09-20 ENCOUNTER — Ambulatory Visit (INDEPENDENT_AMBULATORY_CARE_PROVIDER_SITE_OTHER): Payer: BLUE CROSS/BLUE SHIELD | Admitting: Family Medicine

## 2018-09-20 ENCOUNTER — Emergency Department (HOSPITAL_COMMUNITY)
Admission: EM | Admit: 2018-09-20 | Discharge: 2018-09-20 | Disposition: A | Discharge status: Home / Self Care | Payer: BLUE CROSS/BLUE SHIELD | Attending: Emergency Medicine | Admitting: Emergency Medicine

## 2018-09-20 ENCOUNTER — Emergency Department (HOSPITAL_COMMUNITY): Payer: BLUE CROSS/BLUE SHIELD

## 2018-09-20 ENCOUNTER — Encounter (HOSPITAL_COMMUNITY): Payer: Self-pay | Admitting: Emergency Medicine

## 2018-09-20 ENCOUNTER — Other Ambulatory Visit: Payer: Self-pay

## 2018-09-20 VITALS — BP 124/72 | HR 83 | Temp 97.6°F | Wt 205.0 lb

## 2018-09-20 DIAGNOSIS — K760 Fatty (change of) liver, not elsewhere classified: Secondary | ICD-10-CM | POA: Insufficient documentation

## 2018-09-20 DIAGNOSIS — Q438 Other specified congenital malformations of intestine: Secondary | ICD-10-CM | POA: Insufficient documentation

## 2018-09-20 DIAGNOSIS — R109 Unspecified abdominal pain: Secondary | ICD-10-CM

## 2018-09-20 DIAGNOSIS — Z79899 Other long term (current) drug therapy: Secondary | ICD-10-CM | POA: Insufficient documentation

## 2018-09-20 DIAGNOSIS — Z7984 Long term (current) use of oral hypoglycemic drugs: Secondary | ICD-10-CM | POA: Insufficient documentation

## 2018-09-20 DIAGNOSIS — I10 Essential (primary) hypertension: Secondary | ICD-10-CM | POA: Diagnosis not present

## 2018-09-20 DIAGNOSIS — Z7982 Long term (current) use of aspirin: Secondary | ICD-10-CM | POA: Diagnosis not present

## 2018-09-20 DIAGNOSIS — K6389 Other specified diseases of intestine: Secondary | ICD-10-CM

## 2018-09-20 DIAGNOSIS — K529 Noninfective gastroenteritis and colitis, unspecified: Secondary | ICD-10-CM

## 2018-09-20 DIAGNOSIS — E119 Type 2 diabetes mellitus without complications: Secondary | ICD-10-CM | POA: Diagnosis not present

## 2018-09-20 HISTORY — DX: Type 2 diabetes mellitus without complications: E11.9

## 2018-09-20 LAB — URINALYSIS, ROUTINE W REFLEX MICROSCOPIC
Bilirubin Urine: NEGATIVE
GLUCOSE, UA: NEGATIVE mg/dL
Hgb urine dipstick: NEGATIVE
Ketones, ur: NEGATIVE mg/dL
LEUKOCYTES UA: NEGATIVE
Nitrite: NEGATIVE
PH: 6 (ref 5.0–8.0)
Protein, ur: NEGATIVE mg/dL
SPECIFIC GRAVITY, URINE: 1.044 — AB (ref 1.005–1.030)

## 2018-09-20 LAB — CBC WITH DIFFERENTIAL/PLATELET
Abs Immature Granulocytes: 0.01 10*3/uL (ref 0.00–0.07)
BASOS ABS: 0 10*3/uL (ref 0.0–0.1)
Basophils Relative: 1 %
EOS ABS: 0.1 10*3/uL (ref 0.0–0.5)
Eosinophils Relative: 2 %
HEMATOCRIT: 45.8 % (ref 36.0–46.0)
HEMOGLOBIN: 14 g/dL (ref 12.0–15.0)
IMMATURE GRANULOCYTES: 0 %
LYMPHS ABS: 1.2 10*3/uL (ref 0.7–4.0)
LYMPHS PCT: 28 %
MCH: 25.8 pg — ABNORMAL LOW (ref 26.0–34.0)
MCHC: 30.6 g/dL (ref 30.0–36.0)
MCV: 84.5 fL (ref 80.0–100.0)
Monocytes Absolute: 0.3 10*3/uL (ref 0.1–1.0)
Monocytes Relative: 7 %
NEUTROS PCT: 62 %
Neutro Abs: 2.6 10*3/uL (ref 1.7–7.7)
Platelets: 312 10*3/uL (ref 150–400)
RBC: 5.42 MIL/uL — AB (ref 3.87–5.11)
RDW: 13.1 % (ref 11.5–15.5)
WBC: 4.3 10*3/uL (ref 4.0–10.5)
nRBC: 0 % (ref 0.0–0.2)

## 2018-09-20 LAB — COMPREHENSIVE METABOLIC PANEL
ALBUMIN: 3.8 g/dL (ref 3.5–5.0)
ALK PHOS: 58 U/L (ref 38–126)
ALT: 35 U/L (ref 0–44)
ANION GAP: 7 (ref 5–15)
AST: 26 U/L (ref 15–41)
BUN: 10 mg/dL (ref 6–20)
CALCIUM: 9.7 mg/dL (ref 8.9–10.3)
CHLORIDE: 99 mmol/L (ref 98–111)
CO2: 30 mmol/L (ref 22–32)
Creatinine, Ser: 1.05 mg/dL — ABNORMAL HIGH (ref 0.44–1.00)
GFR calc Af Amer: >60 mL/min (ref >60)
GFR calc non Af Amer: 59 mL/min — ABNORMAL LOW (ref >60)
GLUCOSE: 154 mg/dL — AB (ref 70–99)
Potassium: 3.8 mmol/L (ref 3.5–5.1)
SODIUM: 136 mmol/L (ref 135–145)
Total Bilirubin: 1 mg/dL (ref 0.3–1.2)
Total Protein: 7.4 g/dL (ref 6.5–8.1)

## 2018-09-20 LAB — I-STAT BETA HCG BLOOD, ED (MC, WL, AP ONLY): I-stat hCG, quantitative: <5 m[IU]/mL (ref <5)

## 2018-09-20 LAB — LIPASE, BLOOD: LIPASE: 26 U/L (ref 11–51)

## 2018-09-20 MED ORDER — KETOROLAC TROMETHAMINE 15 MG/ML IJ SOLN
15.0000 mg | Freq: Once | INTRAMUSCULAR | Status: AC
  Administered 2018-09-20: 15 mg via INTRAVENOUS
  Filled 2018-09-20: qty 1

## 2018-09-20 MED ORDER — OXYCODONE-ACETAMINOPHEN 5-325 MG PO TABS: 1.0000 | ORAL_TABLET | Freq: Four times a day (QID) | ORAL | 0 refills | Status: DC | PRN

## 2018-09-20 MED ORDER — SODIUM CHLORIDE 0.9 % IV BOLUS
500.0000 mL | Freq: Once | INTRAVENOUS | Status: AC
  Administered 2018-09-20: 500 mL via INTRAVENOUS

## 2018-09-20 MED ORDER — ACETAMINOPHEN 325 MG PO TABS: 650.0000 mg | ORAL_TABLET | Freq: Once | ORAL | Status: DC

## 2018-09-20 MED ORDER — IOHEXOL 300 MG/ML  SOLN
100.0000 mL | Freq: Once | INTRAMUSCULAR | Status: AC | PRN
  Administered 2018-09-20: 100 mL via INTRAVENOUS

## 2018-09-20 MED ORDER — NAPROXEN 500 MG PO TABS: 500.0000 mg | ORAL_TABLET | Freq: Two times a day (BID) | ORAL | 0 refills | Status: DC

## 2018-09-20 NOTE — ED Notes (Signed)
Pt aware we need urine sample. Pt states she "needs a cup of water or something".

## 2018-09-20 NOTE — ED Provider Notes (Signed)
[facility] EMERGENCY DEPARTMENT Provider Note   CSN: [id number] Arrival date & time: 09/20/18  1004     History   Chief Complaint Chief Complaint  Patient presents with  . Abdominal Pain    HPI Kathleen Larson is a 52 y.o. female with a hx of T2DM, HTN, hyperlipidemia, prior CVA, who is s/p tubal ligation presents with complaint of abdominal pain for the past 5 days. Pain is described as being R sided, aching, constant, and non-radiating. Pain is worse if she lays on her L side. No alleviating factors. No intervention tried PTA. Current pain is an 8/10 in severity. Reports associated nausea w/ 2 episodes of emesis. Seen by PCP today and sent to the ER for further eval. Denies fever, chills, diarrhea, dysuria, hematuria, vaginal bleeding, vaginal discharge, chest pain, or dyspnea. Denies concern for STDs.   HPI  Past Medical History:  Diagnosis Date  . Allergy   . Diabetes mellitus without complication (HCC)   . GERD (gastroesophageal reflux disease)   . Hypertension     Patient Active Problem List   Diagnosis Date Noted  . Depression, controlled 03/10/2018  . Esophageal dysphagia 03/10/2018  . Mild cognitive impairment 12/27/2017  . Lesion of tongue 10/22/2017  . 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 Tinnitus of both ears 06/21/2017  . Type 2 diabetes mellitus (HCC) 06/21/2017  . Greater trochanteric bursitis of right hip 05/26/2017  . Gait disorder 03/25/2017  . Right thigh pain 09/10/2016  . Neck muscle strain 09/10/2016  . Long term current use of antithrombotics/antiplatelets 09/10/2016  . Neck pain on left side 06/19/2016  . Essential hypertension, benign 05/27/2016  . Medical non-compliance   . RUE weakness   . Intracranial vascular stenosis   . Cerebrovascular accident (CVA) (HCC)   . Essential hypertension   . Obesity   . Abnormal EKG   . FATIGUE 01/06/2010  . CONSTIPATION 03/22/2009  .  HELICOBACTER PYLORI INFECTION, HX OF 01/28/2009  . ALLERGIC RHINITIS 03/02/2008  . Hyperlipemia 07/18/2007    Past Surgical History:  Procedure Laterality Date  . TUBAL LIGATION       OB History   None      Home Medications    Prior to Admission medications   Medication Sig Start Date End Date Taking? Authorizing Provider  amLODipine (NORVASC) 10 MG tablet Take 1 tablet (10 mg total) by mouth daily. 08/15/18   Lennox Solders, MD  aspirin 325 MG tablet Take 1 tablet (325 mg total) by mouth daily. 08/15/18   Lennox Solders, MD  atorvastatin (LIPITOR) 80 MG tablet TAKE ONE TABLET BY MOUTH ONCE DAILY AT 6:00 PM 08/15/18   Winfrey, Harlen Labs, MD  BAYER MICROLET LANCETS lancets Use as instructed 08/15/18   Lennox Solders, MD  benzocaine (ORAJEL) 10 % mucosal gel Use as directed 1 application in the mouth or throat daily. 10/22/17   [provider]  empagliflozin (JARDIANCE) 10 MG TABS tablet Take 10 mg by mouth daily. 09/12/18   Moses Manners, MD  famotidine (PEPCID) 20 MG tablet Take 1 tablet (20 mg total) by mouth 2 (two) times daily. 08/15/18   Lennox Solders, MD  glucose blood (CONTOUR NEXT TEST) test strip Use twice daily. 08/16/18   Lennox Solders, MD  Lancet Devices (MICROLET NEXT LANCING DEVICE) MISC 1 strip by Does not apply route every morning. 08/15/18   Lennox Solders, MD  lisinopril-hydrochlorothiazide (ZESTORETIC) 20-12.5  MG tablet Take 2 tablets by mouth daily. 08/18/18   Lennox Solders, MD  metFORMIN (GLUCOPHAGE) 500 MG tablet Take 2 tablets (1,000 mg total) by mouth 2 (two) times daily with a meal. 08/15/18   Winfrey, Harlen Labs, MD  Semaglutide,0.25 or 0.5MG /DOS, (OZEMPIC, 0.25 OR 0.5 MG/DOSE,) 2 MG/1.5ML SOPN Inject 0.25 mg into the skin once a week. 08/15/18   Lennox Solders, MD  sertraline (ZOLOFT) 50 MG tablet Take 0.5 tablets (25 mg total) by mouth daily. 08/15/18   Lennox Solders, MD  traMADol (ULTRAM) 50 MG tablet Take 1 tablet (50 mg  total) by mouth every 6 (six) hours as needed. 09/12/18   Lennox Solders, MD    Family History Family History  Problem Relation Age of Onset  . Stroke Mother        69 yo  . Asthma Daughter   . Cancer Maternal Grandmother   . Colon cancer Neg Hx   . Stomach cancer Neg Hx   . Esophageal cancer Neg Hx     Social History Social History   Tobacco Use  . Smoking status: Never Smoker  . Smokeless tobacco: Never Used  Substance Use Topics  . Alcohol use: Yes  . Drug use: No     Allergies   Shrimp [shellfish allergy]   Review of Systems Review of Systems  Constitutional: Negative for chills and fever.  Respiratory: Negative for shortness of breath.   Cardiovascular: Negative for chest pain.  Gastrointestinal: Positive for abdominal pain, nausea and vomiting. Negative for blood in stool, constipation and diarrhea.  Genitourinary: Negative for dysuria, hematuria, urgency, vaginal bleeding and vaginal discharge.  Neurological: Negative for weakness and numbness.  All other systems reviewed and are negative.    Physical Exam Updated Vital Signs BP (!) 120/93 (BP Location: Right Arm)   Pulse 76   Temp 97.9 F (36.6 C) (Oral)   Resp 16   LMP 10/14/2012   SpO2 98%   Physical Exam  Constitutional: She appears well-developed and well-nourished.  Non-toxic appearance. No distress.  HENT:  Head: Normocephalic and atraumatic.  Eyes: Conjunctivae are normal. Right eye exhibits no discharge. Left eye exhibits no discharge.  Neck: Neck supple.  Cardiovascular: Normal rate and regular rhythm.  Pulmonary/Chest: Effort normal and breath sounds normal. No respiratory distress. She has no wheezes. She has no rhonchi. She has no rales.  Respiration even and unlabored  Abdominal: Soft. She exhibits no distension. There is tenderness in the right upper quadrant and right lower quadrant. There is no rigidity, no rebound and no guarding.  Neurological: She is alert.  Clear speech.     Skin: Skin is warm and dry. No rash noted.  Psychiatric: She has a normal mood and affect. Her behavior is normal.  Nursing note and vitals reviewed.    ED Treatments / Results  Labs (all labs ordered are listed, but only abnormal results are displayed) Labs Reviewed  CBC WITH DIFFERENTIAL/PLATELET - Abnormal; Notable for the following components:      Result Value   RBC 5.42 (*)    MCH 25.8 (*)    All other components within normal limits  COMPREHENSIVE METABOLIC PANEL - Abnormal; Notable for the following components:   Glucose, Bld 154 (*)    Creatinine, Ser 1.05 (*)    GFR calc non Af Amer 59 (*)    All other components within normal limits  LIPASE, BLOOD  URINALYSIS, ROUTINE W REFLEX MICROSCOPIC  I-STAT BETA HCG BLOOD,  ED ([facility], [facility], [facility] ONLY)    EKG None  Radiology Ct Abdomen Pelvis W Contrast  Result Date: 09/20/2018 CLINICAL DATA:  Right lower quadrant pain, nausea, and vomiting for 2 days. EXAM: CT ABDOMEN AND PELVIS WITH CONTRAST TECHNIQUE: Multidetector CT imaging of the abdomen and pelvis was performed using the standard protocol following bolus administration of intravenous contrast. CONTRAST:  OMNIPAQUE IOHEXOL 300 MG/ML  SOLN COMPARISON:  01/14/2011 FINDINGS: Lower chest: No airspace consolidation or pleural effusion in the lung bases. Small sliding hiatal hernia. Hepatobiliary: Hepatic steatosis. Unremarkable gallbladder. No biliary dilatation. Pancreas: Unremarkable. Spleen: Unremarkable. Adrenals/Urinary Tract: Unremarkable adrenal glands. Slightly increased size of subcentimeter low-density lesion in the right kidney, too small to fully characterize though most likely a cyst. Stomach/Bowel: There is no evidence of bowel obstruction. Scattered left-sided colonic diverticulosis is noted. There is a small region of inflammatory stranding with central fat density lateral to the ascending colon without associated diverticulosis in this area (series 3, image 46). The  appendix is unremarkable. Vascular/Lymphatic: No significant vascular findings are present. No enlarged abdominal or pelvic lymph nodes. Reproductive: 5.2 cm uterine fibroid demonstrating interval dystrophic calcification. Similar appearance of a 2 cm low-density focus associated with the left ovary suggesting a benign cyst. Other: No intraperitoneal free fluid. Small fat containing umbilical hernia. Musculoskeletal: No acute osseous abnormality or suspicious osseous lesion. IMPRESSION: 1. Small focus of inflammation adjacent to the ascending colon compatible with epiploic appendagitis. 2. Uterine fibroid with interval calcification. 3. Hepatic steatosis. Electronically Signed   By: Sebastian Ache M.D.   On: 09/20/2018 13:04    Procedures Procedures (including critical care time)  Medications Ordered in ED Medications - No data to display   Initial Impression / Assessment and Plan / ED Course  I have reviewed the triage vital signs and the nursing notes.  Pertinent labs & imaging results that were available during my care of the patient were reviewed by me and considered in my medical decision making (see chart for details).    Patient presents to the ED with complaints of abdominal pain. Patient nontoxic appearing, in no apparent distress, vitals WNL other than mildly elevated diastolic BP- doubt HTN emergency, patient aware of need for recheck. On exam patient tender to RUQ/RLQ, no peritoneal signs. Will evaluate with labs and CT scan.   Labs reviewed and grossly unremarkable. No leukocytosis, no anemia, no significant electrolyte derangements. LFTs and lipase WNL. Renal function at baseline. Urinalysis without obvious infection, elevated specific gravity- received fluids in the ED.   Imaging notable for small focus of inflammation adjacent to the ascending colon compatible with epiploic appendagitis- suspect this is cause of patient's sxs. Additional findings include  Uterine fibroid with  interval calcification as well as hepatic steatosis. No evidence of acute appendicitis, diverticulitis, cholecystitis, obstruction/performation. Not pregnant doubt ectopic. Patient without concern for STDs- doubt PID. Patient tolerating PO. Per UpToDate guidelines on epiploic appendagitis will discharge home with pain control and PCP follow up- Naproxen and a few tablets of percocet for severe pain given. [place] Controlled Substance reporting System queried.  Will also provide general surgery information should this become a re-occurring problem.  I discussed results, treatment plan, need for PCP follow-up, and return precautions with the patient. Provided opportunity for questions, patient confirmed understanding and is in agreement with plan.    Final Clinical Impressions(s) / ED Diagnoses   Final diagnoses:  Epiploic appendagitis  Hepatic steatosis    ED Discharge Orders  Ordered    naproxen (NAPROSYN) 500 MG tablet  2 times daily     09/20/18 1359    oxyCODONE-acetaminophen (PERCOCET/ROXICET) 5-325 MG tablet  Every 6 hours PRN     09/20/18 1359           Shakeya Kerkman, Pleas KochSamantha R, PA-C 09/20/18 1402    Cathren LaineSteinl, Kevin, MD 09/20/18 1421

## 2018-09-20 NOTE — ED Notes (Signed)
Patient transported to CT 

## 2018-09-20 NOTE — ED Notes (Signed)
Pt instructed to provide urine sample.

## 2018-09-20 NOTE — ED Triage Notes (Signed)
Right sided ab pain since Thursday. Worse when laying on left side. Nausea with 2 episodes vomiting. She was sent here by PCP

## 2018-09-20 NOTE — Progress Notes (Signed)
    Subjective:    Patient ID: Kathleen Larson, female    DOB: 02/07/1966, 52 y.o.   MRN: [id number]   CC: R sided flank pain  HPI:  Right sided pain for past 5 days, worse w/ laying on her left side. Dull aching pain. She points to her right flank and right upper quadrant to describe where the pain is. She reports she is eating and drinking normally but started vomiting this morning. She denies fevers or chills. She denies dysuria. She denies diarrhea or constipation.   She reports she is taking the ozempic every Monday and tolerating this well. She was recently prescribed jardiance but has not started taking it yet. She is taking her other medications as prescribed.  Smoking status reviewed- non-smoker  Review of Systems- see HPI   Objective:  BP 124/72   Pulse 83   Temp 97.6 F (36.4 C) (Oral)   Wt 205 lb (93 kg)   LMP 10/14/2012   SpO2 97%   BMI 35.19 kg/m  Vitals and nursing note reviewed  General: mildly ill appearing in NAD HEENT: normocephalic, dry lips but MMM Cardiac: RRR, clear S1 and S2, no murmurs, rubs, or gallops Respiratory: clear to auscultation bilaterally, no increased work of breathing Abdomen: +right CVA tenderness Neuro: alert and oriented, no focal deficits   Assessment & Plan:    1. Right sided abdominal pain Patient vomiting in clinic with CVA tenderness on the right. Her vitals are stable but I am concerned about her having pyelonephritis vs intraabdominal infection or process to cause her 5 days of right sided pain and vomiting. She may be dehydrated as well. Ideally I would like her to get stat labs. She may need abdominal imaging. She is agreeable to go to the ED to get this testing done. She is stable to transport herself there.    Patient to go to ED   Dolores PattyAngela Heidy Mccubbin, DO Family Medicine Resident PGY-3

## 2018-09-20 NOTE — Patient Instructions (Signed)
  Please go to the emergency room to be evaluated- I am worried you have a kidney infection or something serious going on causing your right sided pain and vomiting.  Left untreated this could cause serious medical complications.  If you have questions or concerns please do not hesitate to call at [phone].  Dolores PattyAngela Koni Kannan, DO PGY-3, [facility] 09/20/2018 9:50 AM

## 2018-09-20 NOTE — ED Notes (Signed)
Urine culture was collected in addition to UA and sent down to Main Lab.

## 2018-09-20 NOTE — Discharge Instructions (Signed)
You were seen in the ER today for abdominal pain.   The lab work performed in the emergency department was fairly similar to prior labs you have had performed. Your urine shows that you are somewhat dehydrated, you were given fluids in the emergency department, please be sure to drink plenty of water throughout the day.  Your CT scan showed that you have epiploic appendagitis- we suspect this is the cause of your pain. This is inflammation/twisting of a portion off the appendix. This is something we are sending you home with pain medication for:   -- Naproxen is a nonsteroidal anti-inflammatory medication that will help with pain and swelling. Be sure to take this medication as prescribed with food, 1 pill every 12 hours,  It should be taken with food, as it can cause stomach upset, and more seriously, stomach bleeding. Do not take other nonsteroidal anti-inflammatory medications with this such as Advil, Motrin, Aleve, Mobic, Goodie Powder, or Motrin.    - -Percocet-this is a narcotic/controlled substance medication that has potential addicting qualities.  We recommend that you take 1-2 tablets every 6 hours as needed for severe pain.  Do not drive or operate heavy machinery when taking this medicine as it can be sedating. Do not drink alcohol or take other sedating medications when taking this medicine for safety reasons.  Keep this out of reach of small children.  Please be aware this medicine has Tylenol in it (325 mg/tab) do not exceed the maximum dose of Tylenol in a day per over the counter recommendations should you decide to supplement with Tylenol over the counter.   We have prescribed you new medication(s) today. Discuss the medications prescribed today with your pharmacist as they can have adverse effects and interactions with your other medicines including over the counter and prescribed medications. Seek medical evaluation if you start to experience new or abnormal symptoms after taking one of  these medicines, seek care immediately if you start to experience difficulty breathing, feeling of your throat closing, facial swelling, or rash as these could be indications of a more serious allergic reaction  Your CT scan also showed that you have findings consistent with fatty liver as well as some uterine fibroids.  These are both things to discuss with her primary care provider. We would like you to follow up with a primary care provider within 1 week to re-evaluate your symptoms. Should this be a re-occurring issue we have also given information for general surgery.  Return to the ER should you experience new or worsening symptoms including but not limited to worsening pain, inability to keep fluids down, fever, or any other concerns.

## 2018-09-20 NOTE — ED Notes (Signed)
Pt ambulatory to restroom

## 2018-09-22 ENCOUNTER — Telehealth: Payer: Self-pay | Admitting: Family Medicine

## 2018-09-22 NOTE — Telephone Encounter (Signed)
[facility] called stating they received an order for this patient to get a back brace. They are needing office notes supporting the reason she needs a back brace for insurance purposes. Their fax number is [phone]. Please advise.

## 2018-09-23 NOTE — Telephone Encounter (Signed)
[facility] is calling a second time requesting the status of these office notes. Please advise

## 2018-09-26 ENCOUNTER — Ambulatory Visit (INDEPENDENT_AMBULATORY_CARE_PROVIDER_SITE_OTHER): Payer: BLUE CROSS/BLUE SHIELD | Admitting: Family Medicine

## 2018-09-26 ENCOUNTER — Encounter: Payer: Self-pay | Admitting: Family Medicine

## 2018-09-26 ENCOUNTER — Other Ambulatory Visit: Payer: Self-pay

## 2018-09-26 VITALS — BP 122/84 | HR 74 | Temp 98.1°F | Ht 64.0 in | Wt 208.8 lb

## 2018-09-26 DIAGNOSIS — K529 Noninfective gastroenteritis and colitis, unspecified: Secondary | ICD-10-CM | POA: Diagnosis not present

## 2018-09-26 DIAGNOSIS — F322 Major depressive disorder, single episode, severe without psychotic features: Secondary | ICD-10-CM | POA: Diagnosis not present

## 2018-09-26 DIAGNOSIS — K6389 Other specified diseases of intestine: Secondary | ICD-10-CM

## 2018-09-26 DIAGNOSIS — E119 Type 2 diabetes mellitus without complications: Secondary | ICD-10-CM | POA: Diagnosis not present

## 2018-09-26 HISTORY — DX: Other specified diseases of intestine: K63.89

## 2018-09-26 MED ORDER — SERTRALINE HCL 50 MG PO TABS: 50.0000 mg | ORAL_TABLET | Freq: Every day | ORAL | 3 refills | Status: DC

## 2018-09-26 NOTE — Assessment & Plan Note (Signed)
Appears to be resolved with time and pain medications.  Patient only had a short course of Percocet, so it has been well controlled without narcotics for a few days now.

## 2018-09-26 NOTE — Assessment & Plan Note (Signed)
A1c vastly improved from 12.2-6.2 in October.  Encouraged patient to start taking Jardiance in addition to Ozempic and metformin and reviewed the benefits of these medications.  We will recheck an A1c in 2 months.

## 2018-09-26 NOTE — Patient Instructions (Addendum)
It was nice seeing you today Kathleen Larson!  Today, we are increasing her Zoloft dose from 25 mg to 50 mg daily.  You can pick up your new prescription and take 1 pill each day, or you can take 1 pill from your current prescription and then start on the new prescription, either way is fine.  I would like to see you back in 2 months to discuss how your mood is doing and to check your hemoglobin A1c.  Please take the Jardiance with the Ozempic and metformin to continue your great control of your diabetes.  If you have any questions or concerns, please feel free to call the clinic.   Be well,  Dr. Frances FurbishWinfrey

## 2018-09-26 NOTE — Progress Notes (Signed)
Subjective:    Kathleen Larson - 52 y.o. female MRN [id number]  Date of birth: 06/07/1966  CC:  ZULEY LUTTER is here for follow up for epiploic appendagitis, follow up of mood, and follow-up of diabetes.  HPI:  Epiploic appendagitis -Patient pain has slowly improved since her emergency department visit, where she was given short course of Percocet and naproxen -She does not currently have any concerns about this  Major depression -Patient thinks that she is more depressed lately because she has not been socializing as often -She says that her speech deficits after her CVA make her embarrassed to talk in front of people -Does feel like the Zoloft has been helpful and would like to increase this dose -On PHQ 9, does endorse sometimes feeling like she would be better off dead, but denies ever having a plan and does not feel this way currently -Is looking forward to getting together with family at [facility] on Thanksgiving  Type 2 diabetes, well controlled -Recently saw pharmacy and is taking Ozempic and metformin -Was also prescribed Jardiance but has not started that medication yet because she said her stomach was hurting and did not want to mess it up further with starting Jardiance -Does not know why her A1c improved from 12.2-6.2 recently, but thinks maybe it is due to her drinking more water and the addition of the Ozempic -Continues to take atorvastatin 80 mg daily and declines lipid panel today  Health Maintenance:  Health Maintenance Due  Topic Date Due  . OPHTHALMOLOGY EXAM  01/19/1974  . FOOT EXAM  06/21/2018    -  reports that she has never smoked. She has never used smokeless tobacco. - Review of Systems: Per HPI. - Past Medical History: Patient Active Problem List   Diagnosis Date Noted  . 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 Major depressive disorder, single episode, severe (HCC) 03/10/2018  . Esophageal dysphagia 03/10/2018  . Mild cognitive  impairment 12/27/2017  . Lesion of tongue 10/22/2017  . 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 Tinnitus of both ears 06/21/2017  . Type 2 diabetes mellitus (HCC) 06/21/2017  . Greater trochanteric bursitis of right hip 05/26/2017  . Gait disorder 03/25/2017  . Neck muscle strain 09/10/2016  . Long term current use of antithrombotics/antiplatelets 09/10/2016  . Essential hypertension, benign 05/27/2016  . Medical non-compliance   . Intracranial vascular stenosis   . Cerebrovascular accident (CVA) (HCC)   . Essential hypertension   . Obesity   . Abnormal EKG   . FATIGUE 01/06/2010  . CONSTIPATION 03/22/2009  . HELICOBACTER PYLORI INFECTION, HX OF 01/28/2009  . ALLERGIC RHINITIS 03/02/2008  . Hyperlipemia 07/18/2007   - Medications: reviewed and updated   Objective:   Physical Exam BP 122/84   Pulse 74   Temp 98.1 F (36.7 C) (Oral)   Ht 5\' 4"  (1.626 m)   Wt 208 lb 12.8 oz (94.7 kg)   LMP 10/14/2012   SpO2 99%   BMI 35.84 kg/m  Gen: NAD, alert, cooperative with exam, pleasant CV: RRR, good S1/S2, no murmur Resp: CTABL, no wheezes, non-labored Abd: SNTND, BS present, no guarding or organomegaly Skin: no rashes, normal turgor  Neuro: minor slurred speech Psych: Sad mood and affect, fair insight, fair eye contact  PHQ9:  Depression screen [facility] 2/9 09/26/2018  Decreased Interest 1  Down, Depressed, Hopeless 2  PHQ - 2 Score 3  Altered sleeping 3  Tired, decreased energy 2  Change  in appetite 2  Feeling bad or failure about yourself  3  Trouble concentrating 3  Moving slowly or fidgety/restless 3  Suicidal thoughts 1  PHQ-9 Score 20  Difficult doing work/chores Somewhat difficult         Assessment & Plan:   Epiploic appendagitis Appears to be resolved with time and pain medications.  Patient only had a short course of Percocet, so it has been well controlled without narcotics for a few days now.  Major depressive  disorder, single episode, severe (HCC) Will increase patient's Zoloft dose from 25 mg to 50 mg daily.  Discussed in depth with patient that if she develops a plan to hurt herself, she should call our office or go to the emergency department.  Reviewed with patient that depression is caused by neurochemical changes in her brain and that these can be treated with medication in addition to talking with her physician or therapist.  Ensured that patient is not currently a threat to herself or others.  We will reassess within 2 months since patient did not feel that she could return as soon as 1 month.  Type 2 diabetes mellitus (HCC) A1c vastly improved from 12.2-6.2 in October.  Encouraged patient to start taking Jardiance in addition to Ozempic and metformin and reviewed the benefits of these medications.  We will recheck an A1c in 2 months.    Lezlie OctaveAmanda Tavi Gaughran, M.D. 09/26/2018, 3:02 PM PGY-2, [facility]

## 2018-09-26 NOTE — Assessment & Plan Note (Addendum)
Will increase patient's Zoloft dose from 25 mg to 50 mg daily.  Discussed in depth with patient that if she develops a plan to hurt herself, she should call our office or go to the emergency department.  Reviewed with patient that depression is caused by neurochemical changes in her brain and that these can be treated with medication in addition to talking with her physician or therapist.  Ensured that patient is not currently a threat to herself or others.  We will reassess within 2 months since patient did not feel that she could return as soon as 1 month.

## 2018-09-27 NOTE — Telephone Encounter (Signed)
Matt from [employer] called nurse line checking on the status of office notes for recent back brace order. I informed him of recent pcp note to cancel the order. Susy FrizzleMatt stated it was recently wrote and signed on 11/11? I believe he just wants clarification?

## 2018-09-27 NOTE — Telephone Encounter (Signed)
I think it's okay to cancel the order since Ms. Kathleen Larson did not mention any need for a back brace when I saw her yesterday.  Also, there are no notes mentioning a back brace, so I would not be able to send the notes he is requesting.

## 2018-09-27 NOTE — Telephone Encounter (Signed)
Looking through her office notes from the past year, there is no mention of an order for a back brace.  Patient also did not mention this to me at her visit yesterday.  Therefore, we can cancel the order for her back brace.  Thanks.

## 2018-11-23 ENCOUNTER — Encounter: Payer: Self-pay | Admitting: Family Medicine

## 2018-11-23 ENCOUNTER — Ambulatory Visit (INDEPENDENT_AMBULATORY_CARE_PROVIDER_SITE_OTHER): Payer: Medicare Other | Admitting: Family Medicine

## 2018-11-23 ENCOUNTER — Other Ambulatory Visit: Payer: Self-pay

## 2018-11-23 VITALS — BP 108/80 | HR 70 | Temp 97.3°F | Wt 209.0 lb

## 2018-11-23 DIAGNOSIS — F322 Major depressive disorder, single episode, severe without psychotic features: Secondary | ICD-10-CM | POA: Diagnosis not present

## 2018-11-23 DIAGNOSIS — E119 Type 2 diabetes mellitus without complications: Secondary | ICD-10-CM

## 2018-11-23 DIAGNOSIS — I639 Cerebral infarction, unspecified: Secondary | ICD-10-CM | POA: Diagnosis not present

## 2018-11-23 LAB — POCT GLYCOSYLATED HEMOGLOBIN (HGB A1C): HBA1C, POC (CONTROLLED DIABETIC RANGE): 6.6 % (ref 0.0–7.0)

## 2018-11-23 NOTE — Assessment & Plan Note (Signed)
Advised patient that her muscle weakness of her paresthesias and weakness of her right hand and foot are likely due to her previous CVA and unfortunately I do not have any medications or recommendations for surgeries that would help this issue.  I will refer her for OT to help her gain more function of her right hand.

## 2018-11-23 NOTE — Assessment & Plan Note (Signed)
We will continue Zoloft 50 mg daily since patient says this is working well for her.  Encouraged patient to return to discuss her mood as needed in the future.

## 2018-11-23 NOTE — Assessment & Plan Note (Signed)
Reviewed with patient that she would benefit from taking metformin 2 pills twice daily rather than 2 pills once per day.  She is agreeable to this change.  We will continue her Jardiance and Ozempic.  Congratulated patient on her continued good control of her diabetes.  We will see her back in about 3 months.

## 2018-11-23 NOTE — Progress Notes (Addendum)
Subjective:    Kathleen Larson - 53 y.o. female MRN [id number]  Date of birth: 04/24/1966  CC:  Kathleen SchoolConstance D Larson is here for diabetes follow up and anxiety and depression follow up.  She would also like to talk about R hand weakness.  HPI: Diabetes - blood sugars have been between 80 and 110 - takes metformin 1,000 daily and ozempic as well as jardiance - is walking 30 mins per day - diet includes cereal, salad, grapes, sweet potato and steak, although drinks three small glasses of ginger ale per day  Anxiety and Depression - has taken Zoloft 50 mg daily and is tolerating this well - describes her mood as "better" and thinks that she is doing well - would like to continue Zoloft 50 mg daily  R hand weakness - noticed this symptom after her stroke in 2017 - has not improved since her stroke - keeps her from opening jars and lifting things  - has parasthesias in her R hand and R foot - describes her pain as constant; does not think her pain worsens throughout the day   Health Maintenance:  Health Maintenance Due  Topic Date Due  . OPHTHALMOLOGY EXAM  01/19/1974    -  reports that she has never smoked. She has never used smokeless tobacco. - Review of Systems: Per HPI. - Past Medical History: Patient Active Problem List   Diagnosis Date Noted  . 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 Major depressive disorder, single episode, severe (HCC) 03/10/2018  . Esophageal dysphagia 03/10/2018  . Mild cognitive impairment 12/27/2017  . Lesion of tongue 10/22/2017  . 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 Tinnitus of both ears 06/21/2017  . Type 2 diabetes mellitus (HCC) 06/21/2017  . Greater trochanteric bursitis of right hip 05/26/2017  . Gait disorder 03/25/2017  . Neck muscle strain 09/10/2016  . Long term current use of antithrombotics/antiplatelets 09/10/2016  . Essential hypertension, benign 05/27/2016  . Medical non-compliance     . Intracranial vascular stenosis   . Cerebrovascular accident (CVA) (HCC)   . Essential hypertension   . Obesity   . Abnormal EKG   . FATIGUE 01/06/2010  . CONSTIPATION 03/22/2009  . HELICOBACTER PYLORI INFECTION, HX OF 01/28/2009  . ALLERGIC RHINITIS 03/02/2008  . Hyperlipemia 07/18/2007   - Medications: reviewed and updated   Objective:   Physical Exam BP 108/80   Pulse 70   Temp (!) 97.3 F (36.3 C) (Oral)   Wt 209 lb (94.8 kg)   LMP 10/14/2012   SpO2 99%   BMI 35.87 kg/m  Gen: NAD, alert, cooperative with exam, well-appearing CV: RRR, good S1/S2, no murmur Resp: CTABL, no wheezes, non-labored Musculoskeletal: 4/5 strength in lateral extension of all fingers of right hand and flexion and extension of right wrist.  Preserved sensation of right hand. Neuro: no gross deficits.  Psych: good insight, alert and oriented, normal mood and affect  Depression screen [facility] 2/9 11/23/2018 09/26/2018 09/26/2018  Decreased Interest 1 1 0  Down, Depressed, Hopeless 2 2 0  PHQ - 2 Score 3 3 0  Altered sleeping 3 3 -  Tired, decreased energy 2 2 -  Change in appetite 1 2 -  Feeling bad or failure about yourself  2 3 -  Trouble concentrating 1 3 -  Moving slowly or fidgety/restless 2 3 -  Suicidal thoughts 1 1 -  PHQ-9 Score 15 20 -  Difficult doing work/chores Very difficult Somewhat difficult -  Some recent data might be hidden   GAD 7 : Generalized Anxiety Score 03/10/2018 12/22/2017  Nervous, Anxious, on Edge 3 1  Control/stop worrying 2 3  Worry too much - different things 1 1  Trouble relaxing 1 3  Restless 1 1  Easily annoyed or irritable 2 1  Afraid - awful might happen 3 1  Total GAD 7 Score 13 11  Anxiety Difficulty Extremely difficult Not difficult at all         Assessment & Plan:   Type 2 diabetes mellitus (HCC) Reviewed with patient that she would benefit from taking metformin 2 pills twice daily rather than 2 pills once per day.  She is agreeable to this  change.  We will continue her Jardiance and Ozempic.  Congratulated patient on her continued good control of her diabetes.  We will see her back in about 3 months.  Major depressive disorder, single episode, severe (HCC) We will continue Zoloft 50 mg daily since patient says this is working well for her.  Encouraged patient to return to discuss her mood as needed in the future.  Cerebrovascular accident (CVA) [facility](HCC) Advised patient that her muscle weakness of her paresthesias and weakness of her right hand and foot are likely due to her previous CVA and unfortunately I do not have any medications or recommendations for surgeries that would help this issue.  I will refer her for OT to help her gain more function of her right hand.    Lezlie OctaveAmanda Darlean Warmoth, M.D. 11/23/2018, 3:16 PM PGY-2, [facility]

## 2018-11-23 NOTE — Patient Instructions (Addendum)
It was nice meeting you today Ms. Fross!  Your goal is to reduce your ginger ale to one small glass per day.  These continue your daily walking and eating plenty of fruits and vegetables per day.  Please increase the amount of metformin you are taking to 2 pills twice per day and continue the Ozempic and Jardiance.  I would like to see you back in 3 months to check on your diabetes. I am also sending in a referral to occupational therapy to work on strengthening your right hand. If you have any questions or concerns, please feel free to call the clinic.   Be well,  Dr. Frances Furbish

## 2018-12-12 ENCOUNTER — Ambulatory Visit: Payer: Medicare Other | Admitting: Occupational Therapy

## 2018-12-14 ENCOUNTER — Ambulatory Visit: Payer: Medicare Other | Attending: Family Medicine | Admitting: Occupational Therapy

## 2018-12-14 ENCOUNTER — Encounter: Payer: Self-pay | Admitting: Occupational Therapy

## 2018-12-14 ENCOUNTER — Other Ambulatory Visit: Payer: Self-pay

## 2018-12-14 DIAGNOSIS — R278 Other lack of coordination: Secondary | ICD-10-CM

## 2018-12-14 DIAGNOSIS — I69351 Hemiplegia and hemiparesis following cerebral infarction affecting right dominant side: Secondary | ICD-10-CM | POA: Diagnosis present

## 2018-12-14 DIAGNOSIS — M6281 Muscle weakness (generalized): Secondary | ICD-10-CM | POA: Diagnosis not present

## 2018-12-14 NOTE — Therapy (Signed)
[facility] [address] [place], [place], [address] Phone: [phone]   Fax:  [phone]  Occupational Therapy Evaluation  Patient Details  Name: Kathleen Larson MRN: [id number] Date of Birth: 06-Jan-1966 Referring Provider (OT): Dr. Pearlean Brownie   Encounter Date: 12/14/2018  OT End of Session - 12/14/18 1229    Visit Number  1    Number of Visits  8    Date for OT Re-Evaluation  01/11/19    Authorization Type  medicare will need PN every 10th visit    Authorization Time Period  90 days    OT Start Time  1103    OT Stop Time  1135    OT Time Calculation (min)  32 min    Activity Tolerance  Patient tolerated treatment well       Past Medical History:  Diagnosis Date  . Allergy   . GERD (gastroesophageal reflux disease)   . Hypertension     Past Surgical History:  Procedure Laterality Date  . TUBAL LIGATION      There were no vitals filed for this visit.  Subjective Assessment - 12/14/18 1110    Subjective   Well I want to walk better and my right hand works ok but not perfect    Pertinent History  Pt had CVA in 2017 and then therapies here at the oupt center.      Patient Stated Goals  I can't write for a long time.  I want to walk better ( discussed that pt would need PT eval order from MD for this).     Currently in Pain?  Yes    Pain Score  8     Pain Location  Leg    Pain Orientation  Right    Pain Descriptors / Indicators  Pins and needles;Burning;Sharp;Stabbing   feet pins and needles/burning leg sharp stabblin   Pain Type  Chronic pain    Pain Onset  More than a month ago    Pain Frequency  Intermittent    Aggravating Factors   sitting too much    Pain Relieving Factors  getting up and moving around helps        [facility] OT Assessment - 12/14/18 0001      Assessment   Medical Diagnosis  Late effects of L CVA    Referring Provider (OT)  Dr. Pearlean Brownie    Onset  Date/Surgical Date  --   2017   Hand Dominance  Right    Prior Therapy  PT, OT and ST neuro outpatient center.       Precautions   Precautions  None      Restrictions   Weight Bearing Restrictions  No      Balance Screen   Has the patient fallen in the past 6 months  Yes    How many times?  1      Prior Function   Level of Independence  Independent    Vocation  Unemployed    Leisure  watch tv      ADL   Eating/Feeding  Independent    Grooming  Independent    Upper Body Bathing  Independent    Lower Body Bathing  Independent    Upper Body Dressing  Independent    Lower Body Dressing  Independent    Toilet Transfer  Independent    Toileting - Clothing Manipulation  Independent    Toileting -  Hygiene  Independent  Tub/Shower Transfer  Modified independent    ADL comments  Pt has grab bar in shower and uses 3 in 1 over toilet      IADL   Shopping  Assistance for transportation    Light Housekeeping  Maintains house alone or with occasional assistance    Meal Prep  --   my sisters and aunt cook for me they don't trust me   Publishing rights managerCommunity Mobility  Relies on family or friends for transportation    Medication Management  Is responsible for taking medication in correct dosages at correct time    Physicist, medicalinancial Management  Manages financial matters independently (budgets, writes checks, pays rent, bills goes to bank), collects and keeps track of income      Mobility   Mobility Status  Independent      Written Expression   Dominant Hand  Right      Cognition   Overall Cognitive Status  Within Functional Limits for tasks assessed      Sensation   Light Touch  Impaired by gross assessment   dulled in middle/ring finger tips only   Hot/Cold  Appears Intact    Proprioception  Appears Intact      Coordination   Gross Motor Movements are Fluid and Coordinated  No   pt has ROM WFL but appears slightly apraxic   Fine Motor Movements are Fluid and Coordinated  No   mild intention  tremor   9 Hole Peg Test  Right;Left    Right 9 Hole Peg Test  38.26    Left 9 Hole Peg Test  23.63      Praxis   Praxis  --   pt appears slightly apraxic     Tone   Assessment Location  Right Upper Extremity      ROM / Strength   AROM / PROM / Strength  AROM;PROM;Strength      AROM   Overall AROM   Deficits    Overall AROM Comments  Pt limited to approximately 135* of shoulder flexion, otherwise WFL's      PROM   Overall PROM   Within functional limits for tasks performed    Overall PROM Comments  RUE      Strength   Overall Strength  Deficits    Overall Strength Comments  Shoulder flexion/abduction 3+/5 (pt needs max cues for full effort), elbow flexion, extension WFL's.  Grip weak see below.       Hand Function   Right Hand Gross Grasp  Impaired    Right Hand Grip (lbs)  25    Left Hand Gross Grasp  Functional    Left Hand Grip (lbs)  65      RUE Tone   RUE Tone  Mild   single catch first time then habituated.                          OT Long Term Goals - 12/14/18 1222      OT LONG TERM GOAL #1   Title  Pt will be mod I with HEP for RUE strength, grip strength, coordination- 01/11/2019    Status  New      OT LONG TERM GOAL #2   Title  Pt will verbalize understanding of long term compliance with HEP for RUE     Status  New      OT LONG TERM GOAL #3   Title  Pt will demonstrate improved grip strength by  at least 3 pounds (baseline= 25)    Status  New      OT LONG TERM GOAL #4   Title  Pt will demonstrate improve coordination as evidenced by decreasing time on 9 hole peg RUE by at least 4 seconds     Baseline  R= 38.26    Status  New      OT LONG TERM GOAL #5   Title  Pt will demonstrate ability to pick up at least a 2 pound object from overhead cabinet without dropping    Status  New            Plan - 12/14/18 1224    Clinical Impression Statement  Pt is a 53 year old female s/p L CVA in 2017. Pt returns today and demonstrates  the following deficits related to RUE functional use:  R dominant hemiplegia, decreased grip strength, decreased coordination, mild sensory impairment, R inattention. Pt will benefit from  skilled OT to address these deficits and maximize functional use of RUE.      Occupational Profile and client history currently impacting functional performance  sister, daughter, aunt. PMH:  Diabetes, CVA 2017, depression, anxiety, HTN    Occupational performance deficits (Please refer to evaluation for details):  IADL's;Leisure    Rehab Potential  Good    OT Frequency  2x / week    OT Duration  4 weeks    OT Treatment/Interventions  Self-care/ADL training;Neuromuscular education;Therapeutic exercise;DME and/or AE instruction;Manual Therapy;Therapeutic activities;Patient/family education    Plan  initiate HEP for grip, coordination    Clinical Decision Making  Limited treatment options, no task modification necessary    Consulted and Agree with Plan of Care  Patient       Patient will benefit from skilled therapeutic intervention in order to improve the following deficits and impairments:  Decreased coordination, Decreased strength, Impaired perceived functional ability, Impaired UE functional use, Impaired sensation  Visit Diagnosis: Muscle weakness (generalized) - Plan: Ot plan of care cert/re-cert  Hemiplegia and hemiparesis following cerebral infarction affecting right dominant side (HCC) - Plan: Ot plan of care cert/re-cert  Other lack of coordination - Plan: Ot plan of care cert/re-cert    Problem List Patient Active Problem List   Diagnosis Date Noted  . 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 Major depressive disorder, single episode, severe (HCC) 03/10/2018  . Esophageal dysphagia 03/10/2018  . Mild cognitive impairment 12/27/2017  . Lesion of tongue 10/22/2017  . 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 Tinnitus of both ears 06/21/2017  . Type 2 diabetes  mellitus (HCC) 06/21/2017  . Greater trochanteric bursitis of right hip 05/26/2017  . Gait disorder 03/25/2017  . Neck muscle strain 09/10/2016  . Long term current use of antithrombotics/antiplatelets 09/10/2016  . Essential hypertension, benign 05/27/2016  . Medical non-compliance   . Intracranial vascular stenosis   . Cerebrovascular accident (CVA) (HCC)   . Essential hypertension   . Obesity   . Abnormal EKG   . FATIGUE 01/06/2010  . CONSTIPATION 03/22/2009  . HELICOBACTER PYLORI INFECTION, HX OF 01/28/2009  . ALLERGIC RHINITIS 03/02/2008  . Hyperlipemia 07/18/2007    Norton PastelPulaski,  Halliday, OTR/L 12/14/2018, 12:32 PM  [facility] [address] [place], [place], [address] Phone: [phone]   Fax:  [phone]  Name: Treasa SchoolConstance D Archambeault MRN: [id number] Date of Birth: 08/31/1966

## 2018-12-26 ENCOUNTER — Other Ambulatory Visit: Payer: Self-pay

## 2018-12-26 ENCOUNTER — Encounter: Payer: Self-pay | Admitting: Occupational Therapy

## 2018-12-26 ENCOUNTER — Ambulatory Visit: Payer: Medicare Other | Admitting: Occupational Therapy

## 2018-12-26 DIAGNOSIS — M6281 Muscle weakness (generalized): Secondary | ICD-10-CM

## 2018-12-26 DIAGNOSIS — R278 Other lack of coordination: Secondary | ICD-10-CM

## 2018-12-26 DIAGNOSIS — I69351 Hemiplegia and hemiparesis following cerebral infarction affecting right dominant side: Secondary | ICD-10-CM

## 2018-12-26 DIAGNOSIS — I10 Essential (primary) hypertension: Secondary | ICD-10-CM

## 2018-12-26 MED ORDER — LISINOPRIL-HYDROCHLOROTHIAZIDE 20-12.5 MG PO TABS: 2.0000 | ORAL_TABLET | Freq: Every day | ORAL | 3 refills | Status: DC

## 2018-12-26 NOTE — Therapy (Signed)
[facility] [address] [place], [place], [address] Phone: [phone]   Fax:  [phone]  Occupational Therapy Treatment  Patient Details  Name: Kathleen Larson MRN: [id number] Date of Birth: August 04, 1966 Referring Provider (OT): Dr. Pearlean Brownie   Encounter Date: 12/26/2018  OT End of Session - 12/26/18 1146    Visit Number  2    Number of Visits  8    Date for OT Re-Evaluation  01/11/19    Authorization Type  medicare will need PN every 10th visit    Authorization Time Period  90 days    OT Start Time  0847    OT Stop Time  0930    OT Time Calculation (min)  43 min    Activity Tolerance  Patient tolerated treatment well       Past Medical History:  Diagnosis Date  . Allergy   . GERD (gastroesophageal reflux disease)   . Hypertension     Past Surgical History:  Procedure Laterality Date  . TUBAL LIGATION      There were no vitals filed for this visit.  Subjective Assessment - 12/26/18 0851    Subjective   My hand does feel different after doing all that    Pertinent History  Pt had CVA in 2017 and then [facility].      Patient Stated Goals  I can't write for a long time.  I want to walk better ( discussed that pt would need PT eval order from MD for this).     Currently in Pain?  Yes    Pain Score  8     Pain Location  Leg    Pain Orientation  Right    Pain Descriptors / Indicators  Pins and needles;Burning;Sharp;Stabbing    Pain Type  Chronic pain    Pain Onset  More than a month ago    Pain Frequency  Intermittent    Aggravating Factors   sitting too much    Pain Relieving Factors  getting up and moving around helps                   OT Treatments/Exercises (OP) - 12/26/18 0001      ADLs   Writing  Addressed hand writing - pt with improved fluidity and decreased effort using small pen grip - issued to pt and incoporated hand writing activity into  HEP.      ADL Comments  Reviewed goals and POC - pt in agreement and provided written copy.        Neurological Re-education Exercises   Other Exercises 1  Educated and issued HEP to address coordination as well as grip and finger strength for R hand. After instruction and practice pt able to return demonstrate all activities.  Strongly emphasized the importance of consistency in doing HEP in order to meet goals and improve functional use of R hand - pt verbalized understanding. See pt instruction section for details.              OT Education - 12/26/18 1144    Education Details  HEP for coordination and grip/pinch strength    Person(s) Educated  Patient    Methods  Explanation;Demonstration;Verbal cues;Handout    Comprehension  Verbalized understanding;Returned demonstration          OT Long Term Goals - 12/26/18 1144      OT LONG TERM GOAL #1   Title  Pt  will be mod I with HEP for RUE strength, grip strength, coordination- 01/11/2019    Status  On-going      OT LONG TERM GOAL #2   Title  Pt will verbalize understanding of long term compliance with HEP for RUE     Status  On-going      OT LONG TERM GOAL #3   Title  Pt will demonstrate improved grip strength by at least 3 pounds (baseline= 25)    Status  On-going      OT LONG TERM GOAL #4   Title  Pt will demonstrate improve coordination as evidenced by decreasing time on 9 hole peg RUE by at least 4 seconds     Baseline  R= 38.26    Status  On-going      OT LONG TERM GOAL #5   Title  Pt will demonstrate ability to pick up at least a 2 pound object from overhead cabinet without dropping    Status  On-going            Plan - 12/26/18 1145    Clinical Impression Statement  Pt progressing toward goals. Pt reported that her hand "felt different" after using for entire therapy session.    Occupational Profile and client history currently impacting functional performance  sister, daughter, aunt. PMH:  Diabetes, CVA  2017, depression, anxiety, HTN    Occupational performance deficits (Please refer to evaluation for details):  IADL's;Leisure    Rehab Potential  Good    OT Frequency  2x / week    OT Duration  4 weeks    OT Treatment/Interventions  Self-care/ADL training;Neuromuscular education;Therapeutic exercise;DME and/or AE instruction;Manual Therapy;Therapeutic activities;Patient/family education    Plan  check HEP, add to HEP for proximal strength, NMR for trunk/RUE for overhead reach and functional use.     Consulted and Agree with Plan of Care  Patient       Patient will benefit from skilled therapeutic intervention in order to improve the following deficits and impairments:  Decreased coordination, Decreased strength, Impaired perceived functional ability, Impaired UE functional use, Impaired sensation  Visit Diagnosis: Muscle weakness (generalized)  Hemiplegia and hemiparesis following cerebral infarction affecting right dominant side (HCC)  Other lack of coordination    Problem List Patient Active Problem List   Diagnosis Date Noted  . 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 Major depressive disorder, single episode, severe (HCC) 03/10/2018  . Esophageal dysphagia 03/10/2018  . Mild cognitive impairment 12/27/2017  . Lesion of tongue 10/22/2017  . 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 Tinnitus of both ears 06/21/2017  . Type 2 diabetes mellitus (HCC) 06/21/2017  . Greater trochanteric bursitis of right hip 05/26/2017  . Gait disorder 03/25/2017  . Neck muscle strain 09/10/2016  . Long term current use of antithrombotics/antiplatelets 09/10/2016  . Essential hypertension, benign 05/27/2016  . Medical non-compliance   . Intracranial vascular stenosis   . Cerebrovascular accident (CVA) (HCC)   . Essential hypertension   . Obesity   . Abnormal EKG   . FATIGUE 01/06/2010  . CONSTIPATION 03/22/2009  . HELICOBACTER PYLORI INFECTION, HX OF 01/28/2009   . ALLERGIC RHINITIS 03/02/2008  . Hyperlipemia 07/18/2007    Norton Pastel , OTR/L 12/26/2018, 11:47 AM  [facility] [address] [place], [place], [address] Phone: [phone]   Fax:  [phone]  Name: SHENITHA DEGOLYER MRN: [id number] Date of Birth: 1965/12/17

## 2018-12-26 NOTE — Patient Instructions (Addendum)
1. Grip Strengthening (Resistive Putty)  Red Putty.  MAKE SURE YOU DO LONG HARD SQUEEZES.  MAKE SURE YOU MAKE IT INTO A FAT HOTDOG IN BETWEEN EACH SQUEEZE!!!   Squeeze putty using thumb and all fingers. Repeat _20___ times. Do __2__ sessions per day.  Do it right after breakfast and right after dinner to help you remember.    2. Roll putty into tube on table and pinch along the tube.  MAKE SURE YOU FOLD IT IN HALF AND ROLL IT OUT AGAIN between each time.  Do 5 times.  Do 2 sessions per day.  Do it right after breakfast and right after dinner to help you remember.     Coordination Activities  Perform the following activities for 15-20 minutes  1-2 times per day with right hand(s).   Rotate ball in fingertips (clockwise and counter-clockwise). Make sure you keep the ball in your fingertips - don't let it drop down into your palm  Toss ball between hands.  Toss ball in air and catch with the same hand.  Flip cards 1 at a time as fast as you can.  Do the entire deck.    Deal cards with your thumb (Hold deck in hand and push card off top with thumb).  Pick up coins and place in container or coin bank.  Pick up coins and make stacks of 5.  When you are done making several stacks, try and pick the entire stack up to put back in your container.  Pick up coins one at a time until you get 5-10 in your hand, then move coins from palm to fingertips to stack one at a time.  Practice writing and/or typing.  Screw together nuts and bolts, then unfasten.  Make sure your right hand does the turning!!!!  Use all your fingers to turn!

## 2018-12-29 ENCOUNTER — Ambulatory Visit: Payer: Medicare Other | Admitting: Occupational Therapy

## 2018-12-29 ENCOUNTER — Encounter: Payer: Self-pay | Admitting: Occupational Therapy

## 2018-12-29 DIAGNOSIS — I69351 Hemiplegia and hemiparesis following cerebral infarction affecting right dominant side: Secondary | ICD-10-CM

## 2018-12-29 DIAGNOSIS — R278 Other lack of coordination: Secondary | ICD-10-CM

## 2018-12-29 DIAGNOSIS — M6281 Muscle weakness (generalized): Secondary | ICD-10-CM

## 2018-12-29 NOTE — Therapy (Signed)
[facility] [address] [place], [place], [address] Phone: [phone]   Fax:  [phone]  Occupational Therapy Treatment  Patient Details  Name: Kathleen Larson MRN: [id number] Date of Birth: 08/02/1966 Referring Provider (OT): Dr. Pearlean Brownie   Encounter Date: 12/29/2018  OT End of Session - 12/29/18 1113    Visit Number  3    Number of Visits  8    Date for OT Re-Evaluation  01/11/19    Authorization Type  medicare will need PN every 10th visit    Authorization Time Period  90 days    OT Start Time  0935    OT Stop Time  1015    OT Time Calculation (min)  40 min    Activity Tolerance  Patient tolerated treatment well    Behavior During Therapy  [facility] for tasks assessed/performed       Past Medical History:  Diagnosis Date  . Allergy   . GERD (gastroesophageal reflux disease)   . Hypertension     Past Surgical History:  Procedure Laterality Date  . TUBAL LIGATION      There were no vitals filed for this visit.  Subjective Assessment - 12/29/18 0947    Subjective   I've been doing my exercises at home, about 2 times a day    Pertinent History  Pt had CVA in 2017 and then [facility].      Patient Stated Goals  I can't write for a long time.  I want to walk better ( discussed that pt would need PT eval order from MD for this).     Currently in Pain?  Yes    Pain Score  9     Pain Location  --   all over    Pain Descriptors / Indicators  Aching;Pins and needles    Pain Type  Chronic pain    Pain Onset  More than a month ago    Pain Frequency  Intermittent    Aggravating Factors   cold weather               Addressed HEP provided in last session for coordination, grip/finger strengthening and pt verbalizing no questions or difficulties.  Focus on RUE proximal strengthening while seated and in supine as precursor to use of RUE for functional reach during  ADL/iADL tasks.  Pt completing in supine shoulder flexion grossly to 0-160* and chest press initially holding 1lb dumbbell using bil UE progressed to single RUE with good control noted, very minimal difficulty. Increased difficulty to 5lb dumbbell using bil UEs, completing reps of 5 x3 sets each (shoulder flexion, chest press), taking rest breaks throughout. Trialed single use of RUE for elbow flexion with R elbow supported on mat, pt with mod difficulty performing. Pt able to verbalize and return demonstrate HEP with min cues for technique/carryover and reinforced completing these at home as pt reports she has a 5lb dumbbell to use.  Transitioned to sitting and trialed use of yellow theraband for continued RUE proximal strengthening. Pt with mod difficulty performing seated RUE movement patterns with provided resistance and without gravity eliminated, requiring increased time and mod cues throughout for technique, to maintain posture and for RUE positioning. Due to difficulty with theraband exercise opted not to send pt home with HEP at this time, will continue to address and practice in clinic during follow treatment sessions.  OT Education - 12/29/18 1107    Education Details  HEP for proximal shoulder strength     Person(s) Educated  Patient    Methods  Explanation;Demonstration;Verbal cues    Comprehension  Verbalized understanding;Returned demonstration          OT Long Term Goals - 12/26/18 1144      OT LONG TERM GOAL #1   Title  Pt will be mod I with HEP for RUE strength, grip strength, coordination- 01/11/2019    Status  On-going      OT LONG TERM GOAL #2   Title  Pt will verbalize understanding of long term compliance with HEP for RUE     Status  On-going      OT LONG TERM GOAL #3   Title  Pt will demonstrate improved grip strength by at least 3 pounds (baseline= 25)    Status  On-going      OT LONG TERM GOAL #4   Title  Pt will demonstrate improve  coordination as evidenced by decreasing time on 9 hole peg RUE by at least 4 seconds     Baseline  R= 38.26    Status  On-going      OT LONG TERM GOAL #5   Title  Pt will demonstrate ability to pick up at least a 2 pound object from overhead cabinet without dropping    Status  On-going            Plan - 12/29/18 1110    Clinical Impression Statement  Pt progessing towards goals, reports she has been completing HEP at home. Pt with good participation and verbalizes understanding of addition to HEP after today's session.     Occupational Profile and client history currently impacting functional performance  sister, daughter, aunt. PMH:  Diabetes, CVA 2017, depression, anxiety, HTN    Occupational performance deficits (Please refer to evaluation for details):  IADL's;Leisure    Rehab Potential  Good    OT Frequency  2x / week    OT Duration  4 weeks    OT Treatment/Interventions  Self-care/ADL training;Neuromuscular education;Therapeutic exercise;DME and/or AE instruction;Manual Therapy;Therapeutic activities;Patient/family education    Plan  continue adding to HEP for proximal strength, continue education of theraband HEP and issue theraband if appropriate; continued NMR for trunk/RUE for reaching overhead and functional use     Clinical Decision Making  Limited treatment options, no task modification necessary    Consulted and Agree with Plan of Care  Patient       Patient will benefit from skilled therapeutic intervention in order to improve the following deficits and impairments:  Decreased coordination, Decreased strength, Impaired perceived functional ability, Impaired UE functional use, Impaired sensation  Visit Diagnosis: Muscle weakness (generalized)  Hemiplegia and hemiparesis following cerebral infarction affecting right dominant side (HCC)  Other lack of coordination    Problem List Patient Active Problem List   Diagnosis Date Noted  . Epiploic appendagitis  09/26/2018  . Major depressive disorder, single episode, severe (HCC) 03/10/2018  . Esophageal dysphagia 03/10/2018  . Mild cognitive impairment 12/27/2017  . Lesion of tongue 10/22/2017  . 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 Tinnitus of both ears 06/21/2017  . Type 2 diabetes mellitus (HCC) 06/21/2017  . Greater trochanteric bursitis of right hip 05/26/2017  . Gait disorder 03/25/2017  . Neck muscle strain 09/10/2016  . Long term current use of antithrombotics/antiplatelets 09/10/2016  . Essential hypertension, benign 05/27/2016  . Medical  non-compliance   . Intracranial vascular stenosis   . Cerebrovascular accident (CVA) (HCC)   . Essential hypertension   . Obesity   . Abnormal EKG   . FATIGUE 01/06/2010  . CONSTIPATION 03/22/2009  . HELICOBACTER PYLORI INFECTION, HX OF 01/28/2009  . ALLERGIC RHINITIS 03/02/2008  . Hyperlipemia 07/18/2007    Norton Pastel 12/29/2018, 11:17 AM  [facility] [address] [place], [place], [address] Phone: [phone]   Fax:  [phone]  Name: Kathleen Larson MRN: [id number] Date of Birth: 09/10/1966

## 2019-01-02 ENCOUNTER — Encounter: Payer: Self-pay | Admitting: Occupational Therapy

## 2019-01-02 ENCOUNTER — Ambulatory Visit: Payer: Medicare Other | Admitting: Occupational Therapy

## 2019-01-02 DIAGNOSIS — M6281 Muscle weakness (generalized): Secondary | ICD-10-CM | POA: Diagnosis not present

## 2019-01-02 DIAGNOSIS — I69351 Hemiplegia and hemiparesis following cerebral infarction affecting right dominant side: Secondary | ICD-10-CM

## 2019-01-02 DIAGNOSIS — R278 Other lack of coordination: Secondary | ICD-10-CM

## 2019-01-02 NOTE — Patient Instructions (Signed)
Home exercise program for your arm:  Do these laying on your back!! Do them every day. Do them before you get up in the morning and before you go to sleep at night!  Lay on your back and:  1. Hold a five pound weight between your two hands.  Start with the weight on your chest and reach toward the ceiling. Then lower. Do 10 in the morning and 10 at night.  2. Same as #1. Raise the weight toward the ceiling. KEEPING ELBOWS STRAIGHT, raise the weight over your head as long as you don't have any pain then return. Do 10 in the morning and 10 at night.

## 2019-01-02 NOTE — Therapy (Signed)
[facility] [address] [place], [place], [address] Phone: [phone]   Fax:  [phone]  Occupational Therapy Treatment  Patient Details  Name: Kathleen Larson MRN: [id number] Date of Birth: 05/07/1966 Referring Provider (OT): Dr. Pearlean Brownie   Encounter Date: 01/02/2019  OT End of Session - 01/02/19 0845    Visit Number  4    Number of Visits  8    Date for OT Re-Evaluation  01/11/19    Authorization Type  medicare will need PN every 10th visit    Authorization Time Period  90 days    OT Start Time  0802    OT Stop Time  0844    OT Time Calculation (min)  42 min    Activity Tolerance  Patient tolerated treatment well       Past Medical History:  Diagnosis Date  . Allergy   . GERD (gastroesophageal reflux disease)   . Hypertension     Past Surgical History:  Procedure Laterality Date  . TUBAL LIGATION      There were no vitals filed for this visit.  Subjective Assessment - 01/02/19 0804    Subjective   I fell- [place] went [place] the beach and missd the last step.  it's because they were unfamiliar.  I don't normally fall.     Pertinent History  Pt had CVA in 2017 and then [facility].      Patient Stated Goals  I can't write for a long time.  I want to walk better ( discussed that pt would need PT eval order from MD for this).     Currently in Pain?  Yes    Pain Score  8     Pain Location  Leg    Pain Orientation  Right    Pain Descriptors / Indicators  Aching;Pins and needles    Pain Type  Chronic pain    Pain Onset  More than a month ago    Pain Frequency  Intermittent    Aggravating Factors   cold weather    Pain Relieving Factors  getting up and moving around helps                   OT Treatments/Exercises (OP) - 01/02/19 0001      Exercises   Exercises  Hand      Hand Exercises   Hand Gripper with Large Beads  Picking up 1 inch blocks using gripper  on #2.  Pt intially needed cues for full effort.  Pt with mod dropping as task progressed. Pt needed one rest break during task. Also addressed making stacks of 3 blocks using gripper on #2 - pt able to complete with min difficulty and min vc's for     Other Hand Exercises  Focused on pinch strength, sustained activity with R hand and coordination thru finding small pegs in red putty and placing in small peg board.  Activity also facilitated attention to R hand.  Utlized Grooved Pegboard as treatment to address in hand manipulation.  Pt with minimal difficulty and required increasd time.     Other Hand Exercises  Utlized writing with small pen grip to address sustained activity and attention to R hand.  Pt using pen grip at home and reports that pen grip allows pt to write for longer periods of time with less fatigue in her right hand.  Pt prints and printing is 100% legible for  4 short sentences.              OT Education - 01/02/19 [phone]    Education Details  written HEP for proximal shoulder strength    Person(s) Educated  Patient    Methods  Handout    Comprehension  Verbalized understanding          OT Long Term Goals - 01/02/19 0844      OT LONG TERM GOAL #1   Title  Pt will be mod I with HEP for RUE strength, grip strength, coordination- 01/11/2019    Status  Achieved      OT LONG TERM GOAL #2   Title  Pt will verbalize understanding of long term compliance with HEP for RUE     Status  Achieved      OT LONG TERM GOAL #3   Title  Pt will demonstrate improved grip strength by at least 3 pounds (baseline= 25)    Status  On-going      OT LONG TERM GOAL #4   Title  Pt will demonstrate improve coordination as evidenced by decreasing time on 9 hole peg RUE by at least 4 seconds     Baseline  R= 38.26    Status  On-going      OT LONG TERM GOAL #5   Title  Pt will demonstrate ability to pick up at least a 2 pound object from overhead cabinet without dropping    Status  On-going             Plan - 01/02/19 0844    Clinical Impression Statement  Pt progressing toward goals. Pt reports she is doing her home exercises and has no questions.     Occupational Profile and client history currently impacting functional performance  sister, daughter, aunt. PMH:  Diabetes, CVA 2017, depression, anxiety, HTN    Occupational performance deficits (Please refer to evaluation for details):  IADL's;Leisure    Rehab Potential  Good    OT Frequency  2x / week    OT Duration  4 weeks    OT Treatment/Interventions  Self-care/ADL training;Neuromuscular education;Therapeutic exercise;DME and/or AE instruction;Manual Therapy;Therapeutic activities;Patient/family education    Plan  continued NMR for trunk/RUE for reaching overhead and functional use, coordination, grip strength     Consulted and Agree with Plan of Care  Patient       Patient will benefit from skilled therapeutic intervention in order to improve the following deficits and impairments:  Decreased coordination, Decreased strength, Impaired perceived functional ability, Impaired UE functional use, Impaired sensation  Visit Diagnosis: Muscle weakness (generalized)  Hemiplegia and hemiparesis following cerebral infarction affecting right dominant side (HCC)  Other lack of coordination    Problem List Patient Active Problem List   Diagnosis Date Noted  . 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 Major depressive disorder, single episode, severe (HCC) 03/10/2018  . Esophageal dysphagia 03/10/2018  . Mild cognitive impairment 12/27/2017  . Lesion of tongue 10/22/2017  . 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 Tinnitus of both ears 06/21/2017  . Type 2 diabetes mellitus (HCC) 06/21/2017  . Greater trochanteric bursitis of right hip 05/26/2017  . Gait disorder 03/25/2017  . Neck muscle strain 09/10/2016  . Long term current use of antithrombotics/antiplatelets 09/10/2016  . Essential  hypertension, benign 05/27/2016  . Medical non-compliance   . Intracranial vascular stenosis   . Cerebrovascular accident (CVA) (HCC)   . Essential hypertension   . Obesity   .  Abnormal EKG   . FATIGUE 01/06/2010  . CONSTIPATION 03/22/2009  . HELICOBACTER PYLORI INFECTION, HX OF 01/28/2009  . ALLERGIC RHINITIS 03/02/2008  . Hyperlipemia 07/18/2007    Norton Pastel, OTR/L 01/02/2019, 8:46 AM  [facility] [address] [place], [place], [address] Phone: [phone]   Fax:  [phone]  Name: Kathleen Larson MRN: [id number] Date of Birth: 12/16/1965

## 2019-01-05 ENCOUNTER — Ambulatory Visit: Payer: Medicare Other | Admitting: Occupational Therapy

## 2019-01-05 ENCOUNTER — Encounter: Payer: Self-pay | Admitting: Occupational Therapy

## 2019-01-05 DIAGNOSIS — M6281 Muscle weakness (generalized): Secondary | ICD-10-CM | POA: Diagnosis not present

## 2019-01-05 DIAGNOSIS — I69351 Hemiplegia and hemiparesis following cerebral infarction affecting right dominant side: Secondary | ICD-10-CM

## 2019-01-05 DIAGNOSIS — R278 Other lack of coordination: Secondary | ICD-10-CM

## 2019-01-05 NOTE — Therapy (Signed)
[facility] [address] [place], [place], [address] Phone: [phone]   Fax:  [phone]  Occupational Therapy Treatment  Patient Details  Name: Kathleen Larson MRN: [id number] Date of Birth: 09/20/1966 Referring Provider (OT): Dr. Pearlean Brownie   Encounter Date: 01/05/2019  OT End of Session - 01/05/19 1031    Visit Number  5    Number of Visits  8    Date for OT Re-Evaluation  01/11/19    Authorization Type  medicare will need PN every 10th visit    Authorization Time Period  90 days    OT Start Time  0847    OT Stop Time  0930    OT Time Calculation (min)  43 min    Activity Tolerance  Patient tolerated treatment well    Behavior During Therapy  [facility] for tasks assessed/performed       Past Medical History:  Diagnosis Date  . Allergy   . GERD (gastroesophageal reflux disease)   . Hypertension     Past Surgical History:  Procedure Laterality Date  . TUBAL LIGATION      There were no vitals filed for this visit.  Subjective Assessment - 01/05/19 0853    Subjective   It feels good to move my arm more. I still have some difficulty opening containers/lids like soda bottles.     Pertinent History  Pt had CVA in 2017 and then [facility].      Patient Stated Goals  I can't write for a long time.  I want to walk better ( discussed that pt would need PT eval order from MD for this).     Currently in Pain?  Yes    Pain Score  7     Pain Location  Leg    Pain Orientation  Right    Pain Descriptors / Indicators  Aching;Pins and needles    Pain Type  Chronic pain    Pain Onset  More than a month ago    Pain Frequency  Intermittent    Aggravating Factors   prolonged sitting    Pain Relieving Factors  pain medicine              Pt engaged in seated R shoulder AROM using functional reach patterns in open chain pattern with min cues for proper alignment; graded ROM and  reaching using 1 and 2lb weights for continued RUE strengthening.  Completed standing activity incorporating RUE functional reach to retrieve and replace items from overhead cabinet heights as precursor to reaching overhead during daily functional tasks. Added 1lb progressed to 2lb wrist weights while reaching for additional RUE strengthening. Pt initially able to perform with min difficulty progressively becoming more difficult as pt with poor sustained muscle activity with prolonged use/multiple reps. Engaged in seated fine motor/coordination peg board activity [employer] operational officer Assessment - completed without tweezers during treatment) and pt completing given increased time to perform. Utilizing in hand manipulation and translation when returning pieces holding two pegs in hand, placing one at a time back in container.  Engaged in hand writing task using small grip on writing utensil, pt able to write 6 lines (copying from written material) with hand fatiguing after approx 5 lines; pt self reporting/obersving handwriting towards ends of activity not as neat as initial lines.                 OT Education - 01/05/19 1030  Education Details  encouraged continued performance of HEP provided in previous treatment sessions     Person(s) Educated  Patient    Methods  Explanation    Comprehension  Verbalized understanding          OT Long Term Goals - 01/02/19 0844      OT LONG TERM GOAL #1   Title  Pt will be mod I with HEP for RUE strength, grip strength, coordination- 01/11/2019    Status  Achieved      OT LONG TERM GOAL #2   Title  Pt will verbalize understanding of long term compliance with HEP for RUE     Status  Achieved      OT LONG TERM GOAL #3   Title  Pt will demonstrate improved grip strength by at least 3 pounds (baseline= 25)    Status  On-going      OT LONG TERM GOAL #4   Title  Pt will demonstrate improve coordination as evidenced by decreasing time on  9 hole peg RUE by at least 4 seconds     Baseline  R= 38.26    Status  On-going      OT LONG TERM GOAL #5   Title  Pt will demonstrate ability to pick up at least a 2 pound object from overhead cabinet without dropping    Status  On-going            Plan - 01/05/19 1032    Clinical Impression Statement  Pt progressing towards goals and demonstrates improvements in use of RUE for functional reach into overhead cabinets, though with fatigue with prolonged use.     Occupational Profile and client history currently impacting functional performance  sister, daughter, aunt. PMH:  Diabetes, CVA 2017, depression, anxiety, HTN    Occupational performance deficits (Please refer to evaluation for details):  IADL's;Leisure    Rehab Potential  Good    OT Frequency  2x / week    OT Duration  4 weeks    OT Treatment/Interventions  Self-care/ADL training;Neuromuscular education;Therapeutic exercise;DME and/or AE instruction;Manual Therapy;Therapeutic activities;Patient/family education    Plan  continued NMR for trunk/RUE for sustained proximal strength/endurance during reach overhead for functional use, grip strength, coordination    Clinical Decision Making  Limited treatment options, no task modification necessary    Consulted and Agree with Plan of Care  Patient       Patient will benefit from skilled therapeutic intervention in order to improve the following deficits and impairments:  Decreased coordination, Decreased strength, Impaired perceived functional ability, Impaired UE functional use, Impaired sensation  Visit Diagnosis: Muscle weakness (generalized)  Hemiplegia and hemiparesis following cerebral infarction affecting right dominant side (HCC)  Other lack of coordination    Problem List Patient Active Problem List   Diagnosis Date Noted  . 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 Major depressive disorder, single episode, severe (HCC) 03/10/2018  . Esophageal dysphagia 03/10/2018   . Mild cognitive impairment 12/27/2017  . Lesion of tongue 10/22/2017  . 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 Tinnitus of both ears 06/21/2017  . Type 2 diabetes mellitus (HCC) 06/21/2017  . Greater trochanteric bursitis of right hip 05/26/2017  . Gait disorder 03/25/2017  . Neck muscle strain 09/10/2016  . Long term current use of antithrombotics/antiplatelets 09/10/2016  . Essential hypertension, benign 05/27/2016  . Medical non-compliance   . Intracranial vascular stenosis   . Cerebrovascular accident (CVA) (HCC)   . Essential hypertension   .  Obesity   . Abnormal EKG   . FATIGUE 01/06/2010  . CONSTIPATION 03/22/2009  . HELICOBACTER PYLORI INFECTION, HX OF 01/28/2009  . ALLERGIC RHINITIS 03/02/2008  . Hyperlipemia 07/18/2007    Orlando Penner, OTR/L 01/05/2019, 10:41 AM  [facility] [address] [place], [place], [address] Phone: [phone]   Fax:  [phone]  Name: Kathleen Larson MRN: [id number] Date of Birth: 12/24/1965

## 2019-01-09 ENCOUNTER — Ambulatory Visit: Payer: Medicare Other | Attending: Family Medicine | Admitting: Occupational Therapy

## 2019-01-09 ENCOUNTER — Encounter: Payer: Self-pay | Admitting: Occupational Therapy

## 2019-01-09 DIAGNOSIS — I69351 Hemiplegia and hemiparesis following cerebral infarction affecting right dominant side: Secondary | ICD-10-CM

## 2019-01-09 DIAGNOSIS — M6281 Muscle weakness (generalized): Secondary | ICD-10-CM | POA: Diagnosis present

## 2019-01-09 DIAGNOSIS — R278 Other lack of coordination: Secondary | ICD-10-CM | POA: Insufficient documentation

## 2019-01-09 NOTE — Therapy (Signed)
[facility] [address] [place], [place], [address] Phone: [phone]   Fax:  [phone]  Occupational Therapy Treatment  Patient Details  Name: Kathleen Larson MRN: [id number] Date of Birth: 11/25/1965 Referring Provider (OT): Dr. Pearlean BrownieMarshall Chambliss   Encounter Date: 01/09/2019  OT End of Session - 01/09/19 0937    Visit Number  6    Number of Visits  8    Date for OT Re-Evaluation  01/16/19    Authorization Type  medicare will need PN every 10th visit    Authorization Time Period  90 days    OT Start Time  0845    OT Stop Time  0929    OT Time Calculation (min)  44 min    Activity Tolerance  Patient tolerated treatment well    Behavior During Therapy  [facility] tasks assessed/performed       Past Medical History:  Diagnosis Date  . Allergy   . GERD (gastroesophageal reflux disease)   . Hypertension     Past Surgical History:  Procedure Laterality Date  . TUBAL LIGATION      There were no vitals filed for this visit.  Subjective Assessment - 01/09/19 0848    Subjective   I've been using my right arm more, it feels like its getting stronger.     Pertinent History  Pt had CVA in 2017 and then [facility].      Repetition  Decreases Symptoms    Patient Stated Goals  I can't write for a long time.  I want to walk better ( discussed that pt would need PT eval order from MD for this).     Currently in Pain?  Yes    Pain Score  9     Pain Location  Leg    Pain Orientation  Right    Pain Descriptors / Indicators  Aching;Pins and needles;Sore    Pain Type  Chronic pain    Pain Onset  More than a month ago    Pain Frequency  Intermittent    Aggravating Factors   worse at night     Pain Relieving Factors  pain medicine     Multiple Pain Sites  No           Reviewed current LTGs with pt and re-assessed grip strength and coordination with improvements noted in both (grip  strength 29lb, 9 hole peg 30.05 seconds). Updated additional LTG based upon pt progress. Pt engaged in standing reaching activity to continue addressing RUE functional reach and proximal strength. Pt wearing 2lb wrist weight while reaching for various items in kitchen incorporating overhead and horizontal reach. Use of bil hands when reaching for bigger/heavier items and min cues provided for use of RUE vs LUE when reaching using single UE. Pt fatigued and demonstrating increased effort and time required to use RUE with sustained use.  Pt engaging in seated fine motor activity opening/closing various containers. Pt with mod difficulty opening containers requiring dual manipulation (push and twist), requiring x3 attempts to successfully open with improved carryover in opening container with continued practice.   Additional focus on hand strength and coordination with functional activity using graded clothespins with mod effort noted with prolonged activity and when manipulating clothespins with highest resistance. Pt taking rest breaks PRN.                      OT Long Term Goals -  01/09/19 1132      OT LONG TERM GOAL #1   Title  Pt will be mod I with HEP for RUE strength, grip strength, coordination- 01/16/2019    Status  Achieved      OT LONG TERM GOAL #2   Title  Pt will verbalize understanding of long term compliance with HEP for RUE     Status  Achieved      OT LONG TERM GOAL #3   Title  Pt will demonstrate improved grip strength by at least 3 pounds (baseline= 25)    Status  Achieved   01/09/19 (29lb)     OT LONG TERM GOAL #4   Title  Pt will demonstrate improve coordination as evidenced by decreasing time on 9 hole peg RUE by at least 4 seconds     Baseline  R= 38.26    Status  Achieved   R=30.05 sec      OT LONG TERM GOAL #5   Title  Pt will demonstrate ability to pick up at least a 4 pound object from overhead cabinet without dropping    Status  Revised             Plan - 01/09/19 0938    Clinical Impression Statement  Pt with good progress towards goals demonstrate increased RUE proxmal and fine motor strenght, coordination; improving in tolerance to sustained use    Occupational Profile and client history currently impacting functional performance  sister, daughter, aunt. PMH:  Diabetes, CVA 2017, depression, anxiety, HTN    Occupational performance deficits (Please refer to evaluation for details):  IADL's;Leisure    Rehab Potential  Good    OT Frequency  2x / week    OT Duration  4 weeks    OT Treatment/Interventions  Self-care/ADL training;Neuromuscular education;Therapeutic exercise;DME and/or AE instruction;Manual Therapy;Therapeutic activities;Patient/family education    Plan  continue to address grip strength/coordination, NMR for trunk/RUE strength and functional reach for increased tolerance to sustained use     Consulted and Agree with Plan of Care  Patient       Patient will benefit from skilled therapeutic intervention in order to improve the following deficits and impairments:     Visit Diagnosis: Muscle weakness (generalized)  Other lack of coordination  Hemiplegia and hemiparesis following cerebral infarction affecting right dominant side [facility])    Problem List Patient Active Problem List   Diagnosis Date Noted  . 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 Major depressive disorder, single episode, severe (HCC) 03/10/2018  . Esophageal dysphagia 03/10/2018  . Mild cognitive impairment 12/27/2017  . Lesion of tongue 10/22/2017  . 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 Tinnitus of both ears 06/21/2017  . Type 2 diabetes mellitus (HCC) 06/21/2017  . Greater trochanteric bursitis of right hip 05/26/2017  . Gait disorder 03/25/2017  . Neck muscle strain 09/10/2016  . Long term current use of antithrombotics/antiplatelets 09/10/2016  . Essential hypertension, benign 05/27/2016  .  Medical non-compliance   . Intracranial vascular stenosis   . Cerebrovascular accident (CVA) (HCC)   . Essential hypertension   . Obesity   . Abnormal EKG   . FATIGUE 01/06/2010  . CONSTIPATION 03/22/2009  . HELICOBACTER PYLORI INFECTION, HX OF 01/28/2009  . ALLERGIC RHINITIS 03/02/2008  . Hyperlipemia 07/18/2007    Orlando Penner, OTR/L  01/09/2019, 11:44 AM  [facility] [address] [place], [place], [address] Phone: [phone]   Fax:  [phone]  Name: KRYSTALEE SHIBUYA MRN: [id number] Date of Birth: 09/15/1966

## 2019-01-12 ENCOUNTER — Encounter: Payer: Self-pay | Admitting: Occupational Therapy

## 2019-01-12 ENCOUNTER — Ambulatory Visit: Payer: Medicare Other | Admitting: Occupational Therapy

## 2019-01-12 DIAGNOSIS — M6281 Muscle weakness (generalized): Secondary | ICD-10-CM | POA: Diagnosis not present

## 2019-01-12 DIAGNOSIS — R278 Other lack of coordination: Secondary | ICD-10-CM

## 2019-01-12 DIAGNOSIS — I69351 Hemiplegia and hemiparesis following cerebral infarction affecting right dominant side: Secondary | ICD-10-CM

## 2019-01-12 NOTE — Patient Instructions (Addendum)
Using 5 pound dumbbell  1. Laying down: with Right arm punch dumbbell up to the sky. Keep elbow close to your side when bringing your arm back down.   2. Sitting up, holding dumbbell between both hands, hold at chest and push arms forward, bring back to chest and repeat.   3. Sitting up, hold dumbbell between both hands, start with dumbbell in your lap and raise to shoulder height, keep elbows straight when raising/lowering.   4. Sitting up, Hold dumbbell with right hand across body towards Left knee, bring up to shoulder height keeping elbow bent, punch back down across body, repeat.

## 2019-01-12 NOTE — Therapy (Signed)
[facility] [address] [place], [place], [address] Phone: [phone]   Fax:  [phone]  Occupational Therapy Treatment  Patient Details  Name: Kathleen SchoolConstance D Mclaurin MRN: [id number] Date of Birth: 1966-08-04 Referring Provider (OT): Dr. Pearlean BrownieMarshall Chambliss   Encounter Date: 01/12/2019  OT End of Session - 01/12/19 1033    Visit Number  7    Number of Visits  8    Date for OT Re-Evaluation  01/16/19    Authorization Type  medicare will need PN every 10th visit    Authorization Time Period  90 days    OT Start Time  0931    OT Stop Time  1015    OT Time Calculation (min)  44 min    Activity Tolerance  Patient tolerated treatment well    Behavior During Therapy  [facility] for tasks assessed/performed       Past Medical History:  Diagnosis Date  . Allergy   . GERD (gastroesophageal reflux disease)   . Hypertension     Past Surgical History:  Procedure Laterality Date  . TUBAL LIGATION      There were no vitals filed for this visit.  Subjective Assessment - 01/12/19 1031    Subjective   Things have been going good with using my arm, and my leg feels a little better today too.     Pertinent History  Pt had CVA in 2017 and then [facility].      Patient Stated Goals  I can't write for a long time.  I want to walk better ( discussed that pt would need PT eval order from MD for this).     Currently in Pain?  Yes    Pain Score  7     Pain Location  Leg    Pain Orientation  Right    Pain Descriptors / Indicators  Aching;Pins and needles    Pain Type  Chronic pain    Pain Onset  More than a month ago    Pain Frequency  Constant    Aggravating Factors   worse at night    Pain Relieving Factors  pain medicine     Multiple Pain Sites  No           Continued focus on RUE strengthening including proximal strengthening as precursor to completing overhead reach during functional activities.  Issued pt updated HEP for RUE strengthening using 5lb dumbbell as pt reports she has 5lb dumbbell to use at home. Pt return demonstrating each exercise x10-15 reps each with min cues for technique and min difficulty (intermittent mod difficulty once fatigued). Able to progress to incorporating seated UE exercise in HEP given pt progression and improved proximal strength. HEP including supine, seated, open and closed chain activity. Pt engaged in additional open chain strengthening activity using 2lb dumbbell / 2 lb wrist weight reaching for and placing items at varying heights including use of horizontal, overhead reach and trunk rotation. Pt taking rest breaks PRN throughout.                 OT Education - 01/12/19 1032    Education Details  provided updated HEP proximal strenthening, reviewed and practiced with pt with min cues provided throughout    Person(s) Educated  Patient    Methods  Explanation;Demonstration;Verbal cues;Handout    Comprehension  Verbalized understanding;Returned demonstration;Verbal cues required          OT Long Term Goals -  01/09/19 1132      OT LONG TERM GOAL #1   Title  Pt will be mod I with HEP for RUE strength, grip strength, coordination- 01/16/2019    Status  Achieved      OT LONG TERM GOAL #2   Title  Pt will verbalize understanding of long term compliance with HEP for RUE     Status  Achieved      OT LONG TERM GOAL #3   Title  Pt will demonstrate improved grip strength by at least 3 pounds (baseline= 25)    Status  Achieved   01/09/19 (29lb)     OT LONG TERM GOAL #4   Title  Pt will demonstrate improve coordination as evidenced by decreasing time on 9 hole peg RUE by at least 4 seconds     Baseline  R= 38.26    Status  Achieved   R=30.05 sec      OT LONG TERM GOAL #5   Title  Pt will demonstrate ability to pick up at least a 4 pound object from overhead cabinet without dropping    Status  Revised            Plan - 01/12/19  1034    Clinical Impression Statement  Pt continues to make good progress towards OT goals, provided updated HEP for continued UE strengthening and pt demonstrating during this session    Occupational Profile and client history currently impacting functional performance  sister, daughter, aunt. PMH:  Diabetes, CVA 2017, depression, anxiety, HTN    Occupational performance deficits (Please refer to evaluation for details):  IADL's;Leisure    Rehab Potential  Good    OT Frequency  2x / week    OT Duration  4 weeks    OT Treatment/Interventions  Self-care/ADL training;Neuromuscular education;Therapeutic exercise;DME and/or AE instruction;Manual Therapy;Therapeutic activities;Patient/family education    Plan  review HEP issued, address any questions/concerns; continue to address NMR for trunk/RUE strength and overhead reach during functional tasks     Consulted and Agree with Plan of Care  Patient       Patient will benefit from skilled therapeutic intervention in order to improve the following deficits and impairments:     Visit Diagnosis: Muscle weakness (generalized)  Hemiplegia and hemiparesis following cerebral infarction affecting right dominant side (HCC)  Other lack of coordination    Problem List Patient Active Problem List   Diagnosis Date Noted  . 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 Major depressive disorder, single episode, severe (HCC) 03/10/2018  . Esophageal dysphagia 03/10/2018  . Mild cognitive impairment 12/27/2017  . Lesion of tongue 10/22/2017  . 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 Tinnitus of both ears 06/21/2017  . Type 2 diabetes mellitus (HCC) 06/21/2017  . Greater trochanteric bursitis of right hip 05/26/2017  . Gait disorder 03/25/2017  . Neck muscle strain 09/10/2016  . Long term current use of antithrombotics/antiplatelets 09/10/2016  . Essential hypertension, benign 05/27/2016  . Medical non-compliance   .  Intracranial vascular stenosis   . Cerebrovascular accident (CVA) (HCC)   . Essential hypertension   . Obesity   . Abnormal EKG   . FATIGUE 01/06/2010  . CONSTIPATION 03/22/2009  . HELICOBACTER PYLORI INFECTION, HX OF 01/28/2009  . ALLERGIC RHINITIS 03/02/2008  . Hyperlipemia 07/18/2007    Orlando Penner, OTR/L  01/12/2019, 12:12 PM  [facility] [address] [place], [place], [address] Phone: [phone]   Fax:  [phone]  Name: EIMI FLYTHE MRN: [id number] Date of Birth: 06/10/1966

## 2019-01-16 ENCOUNTER — Ambulatory Visit: Payer: Medicare Other | Admitting: Occupational Therapy

## 2019-01-16 ENCOUNTER — Encounter: Payer: Self-pay | Admitting: Occupational Therapy

## 2019-01-16 DIAGNOSIS — R278 Other lack of coordination: Secondary | ICD-10-CM

## 2019-01-16 DIAGNOSIS — I69351 Hemiplegia and hemiparesis following cerebral infarction affecting right dominant side: Secondary | ICD-10-CM

## 2019-01-16 DIAGNOSIS — M6281 Muscle weakness (generalized): Secondary | ICD-10-CM

## 2019-01-16 NOTE — Therapy (Signed)
[facility] [address] [place], [place], [address] Phone: [phone]   Fax:  [phone]  Occupational Therapy Treatment  Patient Details  Name: Kathleen Larson MRN: [id number] Date of Birth: Dec 30, 1965 Referring Provider (OT): Dr. Talbert Cage   Encounter Date: 01/16/2019  OT End of Session - 01/16/19 0925    Visit Number  8    Number of Visits  8    Date for OT Re-Evaluation  01/16/19    Authorization Type  medicare will need PN every 10th visit    Authorization Time Period  90 days    OT Start Time  0847    OT Stop Time  0917    OT Time Calculation (min)  30 min    Activity Tolerance  Patient tolerated treatment well       Past Medical History:  Diagnosis Date  . Allergy   . GERD (gastroesophageal reflux disease)   . Hypertension     Past Surgical History:  Procedure Laterality Date  . TUBAL LIGATION      There were no vitals filed for this visit.  Subjective Assessment - 01/16/19 0849    Pertinent History  Pt had CVA in 2017 and then therapies here at the oupt center.      Repetition  Decreases Symptoms    Patient Stated Goals  I can't write for a long time.  I want to walk better ( discussed that pt would need PT eval order from MD for this).     Currently in Pain?  Yes    Pain Score  8     Pain Location  Leg    Pain Orientation  Right    Pain Descriptors / Indicators  Aching;Pins and needles    Pain Type  Chronic pain    Pain Onset  More than a month ago    Pain Frequency  Constant    Aggravating Factors   worse at night -I went to the beach this weekend and walked alot    Pain Relieving Factors  pain medicine    Multiple Pain Sites  No                   OT Treatments/Exercises (OP) - 01/16/19 0001      Neurological Re-education Exercises   Hand Gripper with Large Beads  Addressed functional use of R dominant hand for heavy load activities including putting away  heavy items in overhead cabinet (2, 3, 4, and 5 pound objects mulitple tiimes) - pt able to  complete with no drops and min cues for postural alignment with overhead reach. Also addressed carrying case of water (pt has required assistance at grocery store for heavier items). Pt able to pick up from counter anc carry 50 feet and place on low shelf, then pick up from low shelf and carry back 50 feet. Pt also reported that initially she had difficulty carrying gallon sized bleach at  home - pt able to pick up from floor today and place on table as well as carry across the kitchen.  Pt also today able to open water bottle with R hand (was having difficulty doing this before). Pt's gip strength has improved to 45 pounds so HEP upgraded to green putty (from red).              OT Education - 01/16/19 [phone]    Education Details  reviewed progress toward goals, upgraded putty to green, discussed importance of  using RUE as dominant as much as possible at home.     Person(s) Educated  Patient    Methods  Explanation    Comprehension  Verbalized understanding          OT Long Term Goals - 01/16/19 4680      OT LONG TERM GOAL #1   Title  Pt will be mod I with HEP for RUE strength, grip strength, coordination- 01/16/2019    Status  Achieved      OT LONG TERM GOAL #2   Title  Pt will verbalize understanding of long term compliance with HEP for RUE     Status  Achieved      OT LONG TERM GOAL #3   Title  Pt will demonstrate improved grip strength by at least 3 pounds (baseline= 25)    Status  Achieved   01/09/19 (29lb);  01/16/2019  45 pounds     OT LONG TERM GOAL #4   Title  Pt will demonstrate improve coordination as evidenced by decreasing time on 9 hole peg RUE by at least 4 seconds     Baseline  R= 38.26    Status  Achieved   R=30.05 sec      OT LONG TERM GOAL #5   Title  Pt will demonstrate ability to pick up at least a 4 pound object from overhead cabinet without dropping    Status   Achieved   able to place 5 pound object on overhead shelf x5 without dropping           Plan - 01/16/19 0924    Clinical Impression Statement  Pt has met or exceeded all goals and is now ready for discharge. Pt reports she is very pleased with her progress    Occupational Profile and client history currently impacting functional performance  sister, daughter, aunt. PMH:  Diabetes, CVA 2017, depression, anxiety, HTN    Occupational performance deficits (Please refer to evaluation for details):  IADL's;Leisure    Rehab Potential  Good    OT Frequency  2x / week    OT Duration  4 weeks    OT Treatment/Interventions  Self-care/ADL training;Neuromuscular education;Therapeutic exercise;DME and/or AE instruction;Manual Therapy;Therapeutic activities;Patient/family education    Plan  d/c from OT today    Consulted and Agree with Plan of Care  Patient       Patient will benefit from skilled therapeutic intervention in order to improve the following deficits and impairments:     Visit Diagnosis: Muscle weakness (generalized)  Hemiplegia and hemiparesis following cerebral infarction affecting right dominant side ([place])  Other lack of coordination    Problem List Patient Active Problem List   Diagnosis Date Noted  . 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 Major depressive disorder, single episode, severe ([place]) 03/10/2018  . Esophageal dysphagia 03/10/2018  . Mild cognitive impairment 12/27/2017  . Lesion of tongue 10/22/2017  . 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 Tinnitus of both ears 06/21/2017  . Type 2 diabetes mellitus ([place]) 06/21/2017  . Greater trochanteric bursitis of right hip 05/26/2017  . Gait disorder 03/25/2017  . Neck muscle strain 09/10/2016  . Long term current use of antithrombotics/antiplatelets 09/10/2016  . Essential hypertension, benign 05/27/2016  . Medical non-compliance   . Intracranial vascular stenosis   .  Cerebrovascular accident (CVA) ([place])   . Essential hypertension   . Obesity   . Abnormal EKG   . FATIGUE 01/06/2010  . CONSTIPATION 03/22/2009  .  HELICOBACTER PYLORI INFECTION, HX OF 01/28/2009  . ALLERGIC RHINITIS 03/02/2008  . Hyperlipemia 07/18/2007   OCCUPATIONAL THERAPY DISCHARGE SUMMARY  Visits from Start of Care: 8  Current functional level related to goals / functional outcomes: See above   Remaining deficits: Mild R hemiplegia   Education / Equipment: HEP Plan: Patient agrees to discharge.  Patient goals were met. Patient is being discharged due to meeting the stated rehab goals.  ?????      Quay Burow, OTR/L 01/16/2019, 9:27 AM  [facility] [address] [place], [place], [address] Phone: [phone]   Fax:  [phone]  Name: Kathleen Larson MRN: [id number] Date of Birth: 06/21/1966

## 2019-01-19 ENCOUNTER — Ambulatory Visit: Payer: Medicare Other | Admitting: Occupational Therapy

## 2019-01-26 ENCOUNTER — Other Ambulatory Visit: Payer: Self-pay | Admitting: Family Medicine

## 2019-01-26 DIAGNOSIS — Z1231 Encounter for screening mammogram for malignant neoplasm of breast: Secondary | ICD-10-CM

## 2019-03-01 ENCOUNTER — Other Ambulatory Visit: Payer: Self-pay

## 2019-03-01 ENCOUNTER — Ambulatory Visit (INDEPENDENT_AMBULATORY_CARE_PROVIDER_SITE_OTHER): Payer: Medicare Other | Admitting: Family Medicine

## 2019-03-01 VITALS — BP 136/84 | HR 80 | Temp 97.8°F | Ht 64.0 in | Wt 215.0 lb

## 2019-03-01 DIAGNOSIS — E119 Type 2 diabetes mellitus without complications: Secondary | ICD-10-CM

## 2019-03-01 DIAGNOSIS — I639 Cerebral infarction, unspecified: Secondary | ICD-10-CM | POA: Diagnosis not present

## 2019-03-01 DIAGNOSIS — B372 Candidiasis of skin and nail: Secondary | ICD-10-CM

## 2019-03-01 HISTORY — DX: Candidiasis of skin and nail: B37.2

## 2019-03-01 LAB — POCT GLYCOSYLATED HEMOGLOBIN (HGB A1C): HbA1c, POC (controlled diabetic range): 6.6 % (ref 0.0–7.0)

## 2019-03-01 MED ORDER — NYSTATIN 100000 UNIT/GM EX POWD: Freq: Four times a day (QID) | CUTANEOUS | 0 refills | Status: DC

## 2019-03-01 MED ORDER — GLUCOSE BLOOD VI STRP: ORAL_STRIP | 12 refills | Status: DC

## 2019-03-01 NOTE — Patient Instructions (Signed)
It was great to meet you today! Thank you for letting me participate in your care!  Today, we discussed your diabetes which is well controlled today. Please continue taking metformin twice daily. It is keeping your blood sugar under control. Please continue to watch your intake of foods high in fat and sugar and to exercise daily.   I will contact our pharmacy to see if we can work on getting a discount for [employer].   Be well, Jules Schick, DO PGY-2, [facility]

## 2019-03-01 NOTE — Progress Notes (Signed)
Subjective: Chief Complaint  Patient presents with  . Diabetes    HPI: Kathleen Larson is a 53 y.o. presenting to clinic today to discuss the following:  DM Follow Up Patient reports she was on [employer] and now is on Medicaid. Since making the switch to medicaid she can no longer afford her Kathleen Larson and has not been taking it for the past 3 months as she can't afford it. She also cannot afford Kathleen Larson and has been out of that as well. She refuses to take any kind of injection medication such as insulin. She is taking metformin but believes it is giving her "bad gas and belly aches".  Rash under Breasts Reports dry, itchy, slightly bumpy rash under both her breasts and in her skin folds. Has been there before and took a nystatin powder which relieved her symptoms.  ROS noted in HPI.   Past Medical, Surgical, Social, and Family History Reviewed & Updated per EMR.   Pertinent Historical Findings include:   Social History   Tobacco Use  Smoking Status Never Smoker  Smokeless Tobacco Never Used    Objective: BP 136/84   Pulse 80   Temp 97.8 F (36.6 C) (Oral)   Ht 5\' 4"  (1.626 m)   Wt 215 lb (97.5 kg)   LMP 10/14/2012   SpO2 99%   BMI 36.90 kg/m  Vitals and nursing notes reviewed  Physical Exam Gen: Alert and Oriented x 3, NAD HEENT: Normocephalic, atraumatic, PERRLA, EOMI CV: RRR, no murmurs, normal S1, S2 split Resp: CTAB, no wheezing, rales, or rhonchi, comfortable work of breathing Abd: non-distended, non-tender, soft, +bs in all four quadrants Ext: no clubbing, cyanosis, or edema Skin: warm, dry, intact, no rashes  Results for orders placed or performed in visit on 03/01/19 (from the past 72 hour(s))  HgB A1c     Status: None   Collection Time: 03/01/19  2:49 PM  Result Value Ref Range   Hemoglobin A1C     HbA1c POC (<> result, manual entry)     HbA1c, POC (prediabetic range)     HbA1c, POC (controlled diabetic range) 6.6 0.0 - 7.0 %     Assessment/Plan:  Type 2 diabetes mellitus (HCC) Amazingly, her A1c is 6.6% and she is under great control despite no longer taking jardiance or Kathleen Larson. She is still taking Metformin as prescribed but wants to stop taking it eventually. Informed her to please continue taking it as it is the only medication keeping her A1c under control. - 3 month f/u - Will send a message to Dr. Raymondo Band to see if she can get some kind of discount for jardiance as this would be a good medication for her to stay on, especially given her aversion to injections.  Yeast dermatitis Patient declined for me to examine the rash.  - Nystatin powder applied to the area QID prn, f/u in 5 days if no improvement   PATIENT EDUCATION PROVIDED: See AVS    Diagnosis and plan along with any newly prescribed medication(s) were discussed in detail with this patient today. The patient verbalized understanding and agreed with the plan. Patient advised if symptoms worsen return to clinic or ER.    Orders Placed This Encounter  Procedures  . HgB A1c    Meds ordered this encounter  Medications  . nystatin (MYCOSTATIN/NYSTOP) powder    Sig: Apply topically 4 (four) times daily.    Dispense:  15 g    Refill:  0  .  glucose blood (CONTOUR NEXT TEST) test strip    Sig: Please use twice per day, once in the morning and once in the evening.    Dispense:  100 each    Refill:  12     [name], DO 03/01/2019, 3:17 PM PGY-2 [facility]

## 2019-03-03 ENCOUNTER — Ambulatory Visit: Payer: Medicare Other

## 2019-03-07 NOTE — Assessment & Plan Note (Signed)
Patient declined for me to examine the rash.  - Nystatin powder applied to the area QID prn, f/u in 5 days if no improvement

## 2019-03-07 NOTE — Assessment & Plan Note (Signed)
Amazingly, her A1c is 6.6% and she is under great control despite no longer taking jardiance or ozempic. She is still taking Metformin as prescribed but wants to stop taking it eventually. Informed her to please continue taking it as it is the only medication keeping her A1c under control. - 3 month f/u - Will send a message to Dr. Raymondo Band to see if she can get some kind of discount for jardiance as this would be a good medication for her to stay on, especially given her aversion to injections.

## 2019-04-18 ENCOUNTER — Ambulatory Visit
Admission: RE | Admit: 2019-04-18 | Discharge: 2019-04-18 | Disposition: A | Discharge status: Home / Self Care | Payer: Medicare Other | Source: Ambulatory Visit | Attending: Family Medicine | Admitting: Family Medicine

## 2019-04-18 ENCOUNTER — Other Ambulatory Visit: Payer: Self-pay

## 2019-04-18 ENCOUNTER — Other Ambulatory Visit: Payer: Self-pay | Admitting: Family Medicine

## 2019-04-18 DIAGNOSIS — I10 Essential (primary) hypertension: Secondary | ICD-10-CM

## 2019-04-18 DIAGNOSIS — Z1231 Encounter for screening mammogram for malignant neoplasm of breast: Secondary | ICD-10-CM

## 2019-04-19 NOTE — Telephone Encounter (Signed)
Covering for Dr. Shan Levans. I have reviewed [place] and refills are appropriate.

## 2019-05-30 ENCOUNTER — Other Ambulatory Visit: Payer: Self-pay

## 2019-05-30 DIAGNOSIS — I63519 Cerebral infarction due to unspecified occlusion or stenosis of unspecified middle cerebral artery: Secondary | ICD-10-CM

## 2019-05-30 MED ORDER — ATORVASTATIN CALCIUM 80 MG PO TABS: ORAL_TABLET | ORAL | 2 refills | Status: DC

## 2019-05-31 ENCOUNTER — Other Ambulatory Visit: Payer: Self-pay | Admitting: Family Medicine

## 2019-07-05 ENCOUNTER — Other Ambulatory Visit: Payer: Self-pay

## 2019-07-05 ENCOUNTER — Ambulatory Visit (INDEPENDENT_AMBULATORY_CARE_PROVIDER_SITE_OTHER): Payer: Medicare Other | Admitting: Family Medicine

## 2019-07-05 VITALS — BP 145/80 | HR 104

## 2019-07-05 DIAGNOSIS — E119 Type 2 diabetes mellitus without complications: Secondary | ICD-10-CM

## 2019-07-05 DIAGNOSIS — I639 Cerebral infarction, unspecified: Secondary | ICD-10-CM

## 2019-07-05 DIAGNOSIS — B372 Candidiasis of skin and nail: Secondary | ICD-10-CM

## 2019-07-05 DIAGNOSIS — R1031 Right lower quadrant pain: Secondary | ICD-10-CM | POA: Diagnosis not present

## 2019-07-05 LAB — POCT GLYCOSYLATED HEMOGLOBIN (HGB A1C): HbA1c, POC (controlled diabetic range): 6.5 % (ref 0.0–7.0)

## 2019-07-05 MED ORDER — CLOTRIMAZOLE 1 % EX CREA: 1.0000 "application " | TOPICAL_CREAM | Freq: Two times a day (BID) | CUTANEOUS | 1 refills | Status: DC

## 2019-07-05 MED ORDER — DAPAGLIFLOZIN PROPANEDIOL 5 MG PO TABS: 5.0000 mg | ORAL_TABLET | Freq: Every day | ORAL | 3 refills | Status: DC

## 2019-07-05 NOTE — Assessment & Plan Note (Signed)
Well-controlled.  Will continue with metformin 1000 mg twice daily and start dapagliflozin since this is on her insurance's formulary.  Offered patient the option to only take metformin, but she requests an SGLT2 inhibitor.  Discussed with patient that these are excellent medications but do have the side effect of increased risk of UTI and yeast infection.  We will obtain a lipid panel today and recheck A1c in 3 months.

## 2019-07-05 NOTE — Patient Instructions (Signed)
It was nice seeing you today Kathleen Larson!  Your diabetes continues to be very well controlled since her A1c is 6.5 today.  Good job continuing with your metformin 2 pills twice daily.  We are going to add on dapagliflozin, which is in the same family as empagliflozin and appears to be covered by your insurance.  Please take this medication once per day.  We are checking your cholesterol today.  For your abdominal pain, we are going to get labs today, and I will call you if anything is abnormal.  If you begin to have worse pain or develop fever and chills or frequent vomiting, please go to the emergency department.  I am prescribing clotrimazole cream for your rash.  This is an antifungal cream.  You should also use the hydrocortisone cream which has been helpful for you in the past.  You can use both of these medications together, and they will likely resolve your rash over time.  I would like to see you back in about 3 months or earlier if you have any concerns.  If you have any questions or concerns, please feel free to call the clinic.   Be well,  Dr. Shan Levans

## 2019-07-05 NOTE — Progress Notes (Signed)
Subjective:    Kathleen Larson - 53 y.o. female MRN [id number]  Date of birth: [birth year]  CC:  Kathleen Larson is here for follow up of diabetes.  She would also like to discuss RLQ pain and a rash.  HPI: T2DM Meds: Metformin 1000 mg twice daily, would like to restart Jardiance now that her insurance issue has been resolved, continues Lipitor Blood sugars: 70-80 fasting Hypoglycemic symptoms: None Hyperglycemic symptoms: None  RLQ pain  Started 1 week ago Located in the RLQ and does not radiate Intermittent No urinary complaints including dysuria, hematuria, urgency, and frequency Has had normal bowel movements but does endorse 2 occasions where she has vomited the last week Denies fever or chills Has had a normal appetite Alleviating factors include Tylenol, exacerbating factors include standing from a seated position Similar to the pain that she felt when she was diagnosed with epiploic appendagitis  Rash under breasts Was seen in April and was prescribed nystatin powder for this issue, which was not helpful Had improved with use of hydrocortisone cream, but now it has returned No other rashes in other areas of the body  Health Maintenance:  Health Maintenance Due  Topic Date Due  . OPHTHALMOLOGY EXAM  01/19/1974  . INFLUENZA VACCINE  06/10/2019    -  reports that she has never smoked. She has never used smokeless tobacco. - Review of Systems: Per HPI. - Past Medical History: Patient Active Problem List   Diagnosis Date Noted  . RLQ abdominal pain 07/05/2019  . Yeast dermatitis 03/01/2019  . 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 Major depressive disorder, single episode, severe ([place]) 03/10/2018  . Esophageal dysphagia 03/10/2018  . Mild cognitive impairment 12/27/2017  . Lesion of tongue 10/22/2017  . 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 Tinnitus of both ears 06/21/2017  . Type 2 diabetes mellitus ([place]) 06/21/2017   . Greater trochanteric bursitis of right hip 05/26/2017  . Gait disorder 03/25/2017  . Neck muscle strain 09/10/2016  . Long term current use of antithrombotics/antiplatelets 09/10/2016  . Essential hypertension, benign 05/27/2016  . Medical non-compliance   . Intracranial vascular stenosis   . Cerebrovascular accident (CVA) ([place])   . Essential hypertension   . Obesity   . Abnormal EKG   . FATIGUE 01/06/2010  . CONSTIPATION 03/22/2009  . HELICOBACTER PYLORI INFECTION, HX OF 01/28/2009  . ALLERGIC RHINITIS 03/02/2008  . Hyperlipemia 07/18/2007   - Medications: reviewed and updated   Objective:   Physical Exam BP (!) 145/80   Pulse (!) 104   LMP 10/14/2012   SpO2 98%  Gen: NAD, alert, cooperative with exam, well-appearing, pleasant, obese CV: Mildly tachycardic, good S1/S2, no murmur, no edema Resp: CTABL, no wheezes, non-labored Abd: Normal bowel sounds, soft, tender in the RLQ, no rebound tenderness, negative Rovsing sign, negative psoas sign, no peritoneal signs Skin: Macular, faintly erythematous and hyperpigmented rash underneath both breasts without skin breakdown or signs of infection    Assessment & Plan:   Type 2 diabetes mellitus ([place]) Well-controlled.  Will continue with metformin 1000 mg twice daily and start dapagliflozin since this is on her insurance's formulary.  Offered patient the option to only take metformin, but she requests an SGLT2 inhibitor.  Discussed with patient that these are excellent medications but do have the side effect of increased risk of UTI and yeast infection.  We will obtain a lipid panel today and recheck A1c in 3 months.  RLQ abdominal pain  It is possible that this is recurrent epiploic esophagitis, although the differential is broad and includes ovarian or urinary etiologies as well.  Ovarian torsion less likely due to postmenopausal state, and nephrolithiasis less likely due to lack of urinary symptoms.  Will obtain CMP and CBC and will  refer patient for imaging if white blood count is elevated.  Patient was given return precautions to go to the emergency department, including intractable nausea and vomiting, increased pain, and development of fever.  She was also encouraged to continue Tylenol and to take naproxen with food for pain relief.  Yeast dermatitis Recurrent.  Will prescribe clotrimazole cream and advised patient to use this with hydrocortisone cream.    Kathleen OctaveAmanda Larson, M.D. 07/05/2019, 12:02 PM PGY-3, [facility]

## 2019-07-05 NOTE — Assessment & Plan Note (Signed)
Recurrent.  Will prescribe clotrimazole cream and advised patient to use this with hydrocortisone cream.

## 2019-07-05 NOTE — Assessment & Plan Note (Signed)
It is possible that this is recurrent epiploic esophagitis, although the differential is broad and includes ovarian or urinary etiologies as well.  Ovarian torsion less likely due to postmenopausal state, and nephrolithiasis less likely due to lack of urinary symptoms.  Will obtain CMP and CBC and will refer patient for imaging if white blood count is elevated.  Patient was given return precautions to go to the emergency department, including intractable nausea and vomiting, increased pain, and development of fever.  She was also encouraged to continue Tylenol and to take naproxen with food for pain relief.

## 2019-07-06 ENCOUNTER — Telehealth: Payer: Self-pay | Admitting: Family Medicine

## 2019-07-06 ENCOUNTER — Other Ambulatory Visit: Payer: Self-pay | Admitting: Family Medicine

## 2019-07-06 LAB — COMPREHENSIVE METABOLIC PANEL
ALT: 37 IU/L — ABNORMAL HIGH (ref 0–32)
AST: 24 IU/L (ref 0–40)
Albumin/Globulin Ratio: 1.5 (ref 1.2–2.2)
Albumin: 4.2 g/dL (ref 3.8–4.9)
Alkaline Phosphatase: 81 IU/L (ref 39–117)
BUN/Creatinine Ratio: 13 (ref 9–23)
BUN: 14 mg/dL (ref 6–24)
Bilirubin Total: 0.3 mg/dL (ref 0.0–1.2)
CO2: 20 mmol/L (ref 20–29)
Calcium: 9.7 mg/dL (ref 8.7–10.2)
Chloride: 97 mmol/L (ref 96–106)
Creatinine, Ser: 1.04 mg/dL — ABNORMAL HIGH (ref 0.57–1.00)
GFR calc Af Amer: 70 mL/min/{1.73_m2} (ref >59)
GFR calc non Af Amer: 61 mL/min/{1.73_m2} (ref >59)
Globulin, Total: 2.8 g/dL (ref 1.5–4.5)
Glucose: 197 mg/dL — ABNORMAL HIGH (ref 65–99)
Potassium: 4.1 mmol/L (ref 3.5–5.2)
Sodium: 141 mmol/L (ref 134–144)
Total Protein: 7 g/dL (ref 6.0–8.5)

## 2019-07-06 LAB — CBC
Hematocrit: 42.4 % (ref 34.0–46.6)
Hemoglobin: 14 g/dL (ref 11.1–15.9)
MCH: 27.2 pg (ref 26.6–33.0)
MCHC: 33 g/dL (ref 31.5–35.7)
MCV: 83 fL (ref 79–97)
Platelets: 314 10*3/uL (ref 150–450)
RBC: 5.14 x10E6/uL (ref 3.77–5.28)
RDW: 12.8 % (ref 11.7–15.4)
WBC: 6.3 10*3/uL (ref 3.4–10.8)

## 2019-07-06 LAB — LIPID PANEL
Chol/HDL Ratio: 4.1 ratio (ref 0.0–4.4)
Cholesterol, Total: 208 mg/dL — ABNORMAL HIGH (ref 100–199)
HDL: 51 mg/dL (ref >39)
LDL Calculated: 139 mg/dL — ABNORMAL HIGH (ref 0–99)
Triglycerides: 92 mg/dL (ref 0–149)
VLDL Cholesterol Cal: 18 mg/dL (ref 5–40)

## 2019-07-06 NOTE — Telephone Encounter (Signed)
It looks like Ms. Kathleen Larson needed to have a Pap smear 1 year ago due to abnormalities on her Pap smear in 2018, and this has not yet been done.  Would you call and schedule her for an appointment for a Pap smear at her earliest convenience?  Thank you

## 2019-07-10 ENCOUNTER — Telehealth: Payer: Self-pay | Admitting: Family Medicine

## 2019-07-10 ENCOUNTER — Other Ambulatory Visit: Payer: Self-pay | Admitting: Family Medicine

## 2019-07-10 MED ORDER — ROSUVASTATIN CALCIUM 20 MG PO TABS: 20.0000 mg | ORAL_TABLET | Freq: Every day | ORAL | 3 refills | Status: DC

## 2019-07-10 MED ORDER — TRAMADOL HCL 50 MG PO TABS: 50.0000 mg | ORAL_TABLET | Freq: Four times a day (QID) | ORAL | 0 refills | Status: DC | PRN

## 2019-07-10 MED ORDER — NAPROXEN 500 MG PO TABS: 500.0000 mg | ORAL_TABLET | Freq: Two times a day (BID) | ORAL | 0 refills | Status: DC

## 2019-07-10 NOTE — Telephone Encounter (Signed)
Spoke with Kathleen Larson over the phone regarding her cholesterol results.  Asked if she is still taking her atorvastatin 80 mg daily, and she says that she takes it once a day every day.  I told her that her LDL cholesterol and total cholesterol are pretty high, which is unusual to see with atorvastatin 80 mg daily, so we will stop this medication and start rosuvastatin 20 mg daily.  She was amenable to this change.  She also asked if I would send in a short course of tramadol and naproxen, which she has received frequently in the past for her chronic pain.  I will send in 20 tabs of tramadol 50 mg and 15 tabs of naproxen 500 mg.  PMP aware was reviewed.

## 2019-07-13 NOTE — Telephone Encounter (Signed)
Contacted pt and appt made for 08/14/2019. Kathleen Larson, CMA

## 2019-07-18 ENCOUNTER — Telehealth: Payer: Self-pay | Admitting: Pharmacist

## 2019-07-18 NOTE — Telephone Encounter (Signed)
Diabetes Management Follow Up Kathleen Larson is a 53 y.o. female who was contacted for DM management referral from Dr. Shan Levans. Identity was verified using date of birth.  Diabetes medications: Farxiga (dapagliflozin) 5mg  daily , metformin 1,000mg  BID;  *Patient reports only taking metformin 1,000mg  nightly around 7PM and takes [place] in the morning  Pt reports checking her BG once every morning and once in the evening around 9PM Over the last week she reports a BG range of 80-97 Hypoglycemia symptoms: none (no home BG < 70 mg/dL) Hyperglycemia symptoms: none (home BG > 300 mg/dL)   A/P Blood glucose control: stable  Interventions today: -No medication changes today. Pt was able to afford the [place] and is tolerating it well  -BG seems stable but pt did not report post-prandial BG readings so I was not able to assess ana average BG to correlate to an A1c -Educated the pt on the rule of 15 to treat hypoglycemia should it occur  Advised to contact clinic or seek medical attention if concerns or symptoms arise.  Will notify PCP of findings  The patient verbalized understanding of information provided by repeating back concepts discussed.  Follow-up Pt to f/u with Dr. Shan Levans on 08/14/2019  Kennon Holter, PharmD PGY1 [facility] Resident [employer] Phone: [phone]

## 2019-08-05 ENCOUNTER — Other Ambulatory Visit: Payer: Self-pay | Admitting: Family Medicine

## 2019-08-08 ENCOUNTER — Other Ambulatory Visit: Payer: Self-pay | Admitting: Family Medicine

## 2019-08-14 ENCOUNTER — Other Ambulatory Visit (HOSPITAL_COMMUNITY)
Admission: RE | Admit: 2019-08-14 | Discharge: 2019-08-14 | Disposition: A | Discharge status: Home / Self Care | Payer: Medicare Other | Source: Ambulatory Visit | Attending: Family Medicine | Admitting: Family Medicine

## 2019-08-14 ENCOUNTER — Ambulatory Visit (INDEPENDENT_AMBULATORY_CARE_PROVIDER_SITE_OTHER): Payer: Medicare Other | Admitting: Family Medicine

## 2019-08-14 ENCOUNTER — Encounter: Payer: Self-pay | Admitting: Family Medicine

## 2019-08-14 ENCOUNTER — Other Ambulatory Visit: Payer: Self-pay

## 2019-08-14 VITALS — BP 120/94 | HR 88 | Wt 214.2 lb

## 2019-08-14 DIAGNOSIS — Z0000 Encounter for general adult medical examination without abnormal findings: Secondary | ICD-10-CM

## 2019-08-14 DIAGNOSIS — Z1151 Encounter for screening for human papillomavirus (HPV): Secondary | ICD-10-CM | POA: Diagnosis not present

## 2019-08-14 DIAGNOSIS — R87612 Low grade squamous intraepithelial lesion on cytologic smear of cervix (LGSIL): Secondary | ICD-10-CM | POA: Diagnosis not present

## 2019-08-14 DIAGNOSIS — Z23 Encounter for immunization: Secondary | ICD-10-CM | POA: Diagnosis not present

## 2019-08-14 DIAGNOSIS — Z780 Asymptomatic menopausal state: Secondary | ICD-10-CM | POA: Insufficient documentation

## 2019-08-14 HISTORY — DX: Encounter for general adult medical examination without abnormal findings: Z00.00

## 2019-08-14 HISTORY — DX: Low grade squamous intraepithelial lesion on cytologic smear of cervix (LGSIL): R87.612

## 2019-08-14 NOTE — Assessment & Plan Note (Addendum)
Pap smear with HPV cotesting performed today.  GC/Chlamydia testing were also collected per patient preference.  We will use these results to guide further management.

## 2019-08-14 NOTE — Assessment & Plan Note (Signed)
Flu shot given today.  Patient counseled on seeing the ophthalmologist as soon as possible and to continue seeing yearly due to her diabetes.

## 2019-08-14 NOTE — Progress Notes (Signed)
   Subjective:    Kathleen Larson - 52 y.o. female MRN [id number]  Date of birth: 01/03/1966  CC:  Kathleen Larson is here for a pap smear.  HPI: Last Pap smear was in 2018 and was significant for LSIL with high risk HPV.  She had a colposcopy later that year which was negative for acetowhite lesions.  No biopsies were taken.  She was recommended to repeat her Pap with HPV cotesting in 1 year, but this has not yet been done.  Patient denies abnormal uterine bleeding.  Last period was when the patient was in her late 23s.  She has had no vaginal bleeding since then.  She requests gonorrhea and Chlamydia testing along with her Pap today.  Health Maintenance:  -Would like her flu shot today.  Is amenable to getting an eye exam before the end of this year. Health Maintenance Due  Topic Date Due  . OPHTHALMOLOGY EXAM  01/19/1974  . INFLUENZA VACCINE  06/10/2019    -  reports that she has never smoked. She has never used smokeless tobacco. - Review of Systems: Per HPI. - Past Medical History: Patient Active Problem List   Diagnosis Date Noted  . LGSIL on Pap smear of cervix 08/14/2019  . Healthcare maintenance 08/14/2019  . Yeast dermatitis 03/01/2019  . 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 Major depressive disorder, single episode, severe ([place]) 03/10/2018  . Esophageal dysphagia 03/10/2018  . Mild cognitive impairment 12/27/2017  . Lesion of tongue 10/22/2017  . 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 Tinnitus of both ears 06/21/2017  . Type 2 diabetes mellitus ([place]) 06/21/2017  . Greater trochanteric bursitis of right hip 05/26/2017  . Gait disorder 03/25/2017  . Neck muscle strain 09/10/2016  . Long term current use of antithrombotics/antiplatelets 09/10/2016  . Essential hypertension, benign 05/27/2016  . Medical non-compliance   . Intracranial vascular stenosis   . Cerebrovascular accident (CVA) ([place])   . Essential hypertension   .  Obesity   . Abnormal EKG   . FATIGUE 01/06/2010  . CONSTIPATION 03/22/2009  . HELICOBACTER PYLORI INFECTION, HX OF 01/28/2009  . ALLERGIC RHINITIS 03/02/2008  . Hyperlipemia 07/18/2007   - Medications: reviewed and updated   Objective:   Physical Exam BP (!) 120/94   Pulse 88   Wt 214 lb 3.2 oz (97.2 kg)   LMP 10/14/2012   SpO2 99%   BMI 36.77 kg/m  Gen: NAD, alert, cooperative with exam, well-appearing GU: Normal vulva, vagina without lesions, cervix without exophytic lesions but some areas of discoloration.  No cervical motion tenderness.     Assessment & Plan:   LGSIL on Pap smear of cervix Pap smear with HPV cotesting performed today.  GC/Chlamydia testing were also collected per patient preference.  We will use these results to guide further management.  Healthcare maintenance Flu shot given today.  Patient counseled on seeing the ophthalmologist as soon as possible and to continue seeing yearly due to her diabetes.    Maia Breslow, M.D. 08/14/2019, 2:41 PM PGY-3, [place]

## 2019-08-14 NOTE — Patient Instructions (Addendum)
It was nice seeing you today Kathleen Larson!  Today, we performed a Pap smear with HPV cotesting.  We will let you know what these results are and what further management is based on these results.  We also performed gonorrhea and Chlamydia testing, and we will let you know what these results are when they return.    Please see an ophthalmologist soon for your yearly eye appointment.  I would like to see you back in about 2 months to follow-up on your diabetes.  If you have any questions or concerns, please feel free to call the clinic.   Be well,  Dr. Shan Levans

## 2019-08-21 LAB — CYTOLOGY - PAP
Chlamydia: NEGATIVE
Diagnosis: NEGATIVE
High risk HPV: NEGATIVE
Neisseria Gonorrhea: NEGATIVE

## 2019-08-22 ENCOUNTER — Telehealth: Payer: Self-pay | Admitting: *Deleted

## 2019-08-22 NOTE — Telephone Encounter (Signed)
-----   Message from Kathrene Alu, MD sent at 08/22/2019  8:36 AM EDT ----- Would you let Ms. Stringfield know that her pap smear and GC/Chlamydia testing were normal?  She does not need another pap smear for 5 years.

## 2019-08-22 NOTE — Telephone Encounter (Signed)
Pt informed of below.Kathleen Larson, CMA ? ?

## 2019-10-25 ENCOUNTER — Other Ambulatory Visit: Payer: Self-pay | Admitting: Family Medicine

## 2019-11-06 ENCOUNTER — Other Ambulatory Visit: Payer: Self-pay | Admitting: Family Medicine

## 2019-11-15 ENCOUNTER — Ambulatory Visit (INDEPENDENT_AMBULATORY_CARE_PROVIDER_SITE_OTHER): Payer: Medicare (Managed Care)

## 2019-11-15 ENCOUNTER — Other Ambulatory Visit: Payer: Self-pay

## 2019-11-15 DIAGNOSIS — Z0000 Encounter for general adult medical examination without abnormal findings: Secondary | ICD-10-CM | POA: Diagnosis not present

## 2019-11-15 NOTE — Addendum Note (Signed)
Addended by: Steva Colder on: 11/15/2019 03:39 PM   Modules accepted: Level of Service

## 2019-11-15 NOTE — Patient Instructions (Addendum)
You spoke to Kathleen Larson, CMA over the phone for your annual wellness visit.  We discussed goals: Goals    . Exercise 3x per week (30 min per time)     Patient is currently walking ~2x a week. 11/2019      We also discussed recommended health maintenance. As discussed, you are up to date with everything! I scheduled you a diabetes apt for 12/11/2019 @9 :30am.   Health Maintenance  Topic Date Due  . OPHTHALMOLOGY EXAM  01/19/1974  . FOOT EXAM  11/24/2019  . HEMOGLOBIN A1C  01/05/2020  . MAMMOGRAM  04/17/2021  . PAP SMEAR-Modifier  08/13/2022  . TETANUS/TDAP  09/10/2026  . COLONOSCOPY  05/24/2028  . INFLUENZA VACCINE  Completed  . PNEUMOCOCCAL POLYSACCHARIDE VACCINE AGE 79-64 HIGH RISK  Completed  . Hepatitis C Screening  Completed  . HIV Screening  Completed   Increase your walking to 30 minutes per time 3x a week. Complete an advance directive packet.  I scheduled you a diabetes follow-up for February 1st @930am  with PCP.  Health Maintenance, Female Adopting a healthy lifestyle and getting preventive care are important in promoting health and wellness. Ask your health care provider about:  The right schedule for you to have regular tests and exams.  Things you can do on your own to prevent diseases and keep yourself healthy. What should I know about diet, weight, and exercise? Eat a healthy diet   Eat a diet that includes plenty of vegetables, fruits, low-fat dairy products, and lean protein.  Do not eat a lot of foods that are high in solid fats, added sugars, or sodium. Maintain a healthy weight Body mass index (BMI) is used to identify weight problems. It estimates body fat based on height and weight. Your health care provider can help determine your BMI and help you achieve or maintain a healthy weight. Get regular exercise Get regular exercise. This is one of the most important things you can do for your health. Most adults should:  Exercise for at least 150  minutes each week. The exercise should increase your heart rate and make you sweat (moderate-intensity exercise).  Do strengthening exercises at least twice a week. This is in addition to the moderate-intensity exercise.  Spend less time sitting. Even light physical activity can be beneficial. Watch cholesterol and blood lipids Have your blood tested for lipids and cholesterol at 56 years of age, then have this test every 5 years. Have your cholesterol levels checked more often if:  Your lipid or cholesterol levels are high.  You are older than 56 years of age.  You are at high risk for heart disease. What should I know about cancer screening? Depending on your health history and family history, you may need to have cancer screening at various ages. This may include screening for:  Breast cancer.  Cervical cancer.  Colorectal cancer.  Skin cancer.  Lung cancer. What should I know about heart disease, diabetes, and high blood pressure? Blood pressure and heart disease  High blood pressure causes heart disease and increases the risk of stroke. This is more likely to develop in people who have high blood pressure readings, are of African descent, or are overweight.  Have your blood pressure checked: ? Every 3-5 years if you are 36-63 years of age. ? Every year if you are 75 years old or older. Diabetes Have regular diabetes screenings. This checks your fasting blood sugar level. Have the screening done:  Once every  three years after age 56 if you are at a normal weight and have a low risk for diabetes.  More often and at a younger age if you are overweight or have a high risk for diabetes. What should I know about preventing infection? Hepatitis B If you have a higher risk for hepatitis B, you should be screened for this virus. Talk with your health care provider to find out if you are at risk for hepatitis B infection. Hepatitis C Testing is recommended for:  Everyone born  from [birth year] through 1967.  Anyone with known risk factors for hepatitis C. Sexually transmitted infections (STIs)  Get screened for STIs, including gonorrhea and chlamydia, if: ? You are sexually active and are younger than 56 years of age. ? You are older than 56 years of age and your health care provider tells you that you are at risk for this type of infection. ? Your sexual activity has changed since you were last screened, and you are at increased risk for chlamydia or gonorrhea. Ask your health care provider if you are at risk.  Ask your health care provider about whether you are at high risk for HIV. Your health care provider may recommend a prescription medicine to help prevent HIV infection. If you choose to take medicine to prevent HIV, you should first get tested for HIV. You should then be tested every 3 months for as long as you are taking the medicine. Pregnancy  If you are about to stop having your period (premenopausal) and you may become pregnant, seek counseling before you get pregnant.  Take 400 to 800 micrograms (mcg) of folic acid every day if you become pregnant.  Ask for birth control (contraception) if you want to prevent pregnancy. Osteoporosis and menopause Osteoporosis is a disease in which the bones lose minerals and strength with aging. This can result in bone fractures. If you are 5 years old or older, or if you are at risk for osteoporosis and fractures, ask your health care provider if you should:  Be screened for bone loss.  Take a calcium or vitamin D supplement to lower your risk of fractures.  Be given hormone replacement therapy (HRT) to treat symptoms of menopause. Follow these instructions at home: Lifestyle  Do not use any products that contain nicotine or tobacco, such as cigarettes, e-cigarettes, and chewing tobacco. If you need help quitting, ask your health care provider.  Do not use street drugs.  Do not share needles.  Ask your health  care provider for help if you need support or information about quitting drugs. Alcohol use  Do not drink alcohol if: ? Your health care provider tells you not to drink. ? You are pregnant, may be pregnant, or are planning to become pregnant.  If you drink alcohol: ? Limit how much you use to 0-1 drink a day. ? Limit intake if you are breastfeeding.  Be aware of how much alcohol is in your drink. In the [place] one drink equals one 12 oz bottle of beer (355 mL), one 5 oz glass of wine (148 mL), or one 1 oz glass of hard liquor (44 mL). General instructions  Schedule regular health, dental, and eye exams.  Stay current with your vaccines.  Tell your health care provider if: ? You often feel depressed. ? You have ever been abused or do not feel safe at home. Summary  Adopting a healthy lifestyle and getting preventive care are important in promoting health and wellness.  Follow your health care provider's instructions about healthy diet, exercising, and getting tested or screened for diseases.  Follow your health care provider's instructions on monitoring your cholesterol and blood pressure. This information is not intended to replace advice given to you by your health care provider. Make sure you discuss any questions you have with your health care provider. Document Revised: 10/19/2018 Document Reviewed: 10/19/2018 Elsevier Patient Education  2020 Elsevier Inc.   Diet Recommendations for Diabetes   1. Eat at least 3 meals and 1-2 snacks per day. Never go more than 4-5 hours while awake without eating. Eat breakfast within the first hour of getting up.   2. Limit starchy foods to TWO per meal and ONE per snack. ONE portion of a starchy  food is equal to the following:   - ONE slice of bread (or its equivalent, such as half of a hamburger bun).   - 1/2 cup of a "scoopable" starchy food such as potatoes or rice.   - 15 grams of Total Carbohydrate as shown on food label.  3.  Include at every meal: a protein food, a carb food, and vegetables and/or fruit.   - Obtain twice the volume of vegetables as protein or carbohydrate foods for both lunch and dinner.   - Fresh or frozen vegetables are best.   - Keep frozen vegetables on hand for a quick vegetable serving.       Starchy (carb) foods: Bread, rice, pasta, potatoes, corn, cereal, grits, crackers, bagels, muffins, all baked goods.  (Fruits, milk, and yogurt also have carbohydrate, but most of these foods will not spike your blood sugar as most starchy foods will.)  A few fruits do cause high blood sugars; use small portions of bananas (limit to 1/2 at a time), grapes, watermelon, oranges, and most tropical fruits.    Protein foods: Meat, fish, poultry, eggs, dairy foods, and beans such as pinto and kidney beans (beans also provide carbohydrate).     Our clinic's number is [phone]. Please call with questions or concerns about what we discussed today.

## 2019-11-15 NOTE — Progress Notes (Addendum)
Subjective:   Kathleen Larson is a 54 y.o. female who presents for Medicare Annual (Subsequent) preventive examination.  The patient consented to a virtual visit.  Review of Systems: Defer to PCP  Cardiac Risk Factors include: diabetes mellitus;hypertension  Objective:   Vitals: LMP 10/14/2012   There is no height or weight on file to calculate BMI.  Advanced Directives 11/15/2019 03/01/2019 01/16/2019 01/12/2019 01/09/2019 01/05/2019 01/02/2019  Does Patient Have a Medical Advance Directive? No No No No No No No  Would patient like information on creating a medical advance directive? Yes (MAU/Ambulatory/Procedural Areas - Information given) No - Patient declined No - Patient declined No - Patient declined No - Patient declined No - Patient declined No - Patient declined    Tobacco Social History   Tobacco Use  Smoking Status Never Smoker  Smokeless Tobacco Never Used     Clinical Intake:  Pre-visit preparation completed: Yes  How often do you need to have someone help you when you read instructions, pamphlets, or other written materials from your doctor or pharmacy?: 2 - Rarely What is the last grade level you completed in school?: High School  Past Medical History:  Diagnosis Date  . Allergy   . Diabetes mellitus without complication (HCC)   . GERD (gastroesophageal reflux disease)   . Hyperlipidemia   . Hypertension   . Stroke [facility])    CVA 2017   Past Surgical History:  Procedure Laterality Date  . TUBAL LIGATION     Family History  Problem Relation Age of Onset  . Kidney disease Mother   . Hypertension Father   . Kidney disease Brother   . Asthma Daughter   . Colon cancer Neg Hx   . Stomach cancer Neg Hx   . Esophageal cancer Neg Hx    Social History   Socioeconomic History  . Marital status: Single    Spouse name: Not on file  . Number of children: 1  . Years of education: 44  . Highest education level: High school graduate  Occupational History  .  Occupation: Market researcher  . Occupation: Forensic scientist   Tobacco Use  . Smoking status: Never Smoker  . Smokeless tobacco: Never Used  Substance and Sexual Activity  . Alcohol use: Yes    Comment: occassional wine  . Drug use: No  . Sexual activity: Not Currently  Other Topics Concern  . Not on file  Social History Narrative   Patient lives alone in [place].   Patient enjoys watching TV and spending time with her sister and daughter.    Patient enjoyed playing basketball before her CVA in 2017.   Patient walks with friends for exercise.   Social Determinants of Health   Financial Resource Strain: Low Risk   . Difficulty of Paying Living Expenses: Not hard at all  Food Insecurity: No Food Insecurity  . Worried About Programme researcher, broadcasting/film/video in the Last Year: Never true  . Ran Out of Food in the Last Year: Never true  Transportation Needs: No Transportation Needs  . Lack of Transportation (Medical): No  . Lack of Transportation (Non-Medical): No  Physical Activity: Insufficiently Active  . Days of Exercise per Week: 2 days  . Minutes of Exercise per Session: 20 min  Stress: Stress Concern Present  . Feeling of Stress : To some extent  Social Connections: Somewhat Isolated  . Frequency of Communication with Friends and Family: More than three times a week  . Frequency of  Social Gatherings with Friends and Family: More than three times a week  . Attends Religious Services: More than 4 times per year  . Active Member of Clubs or Organizations: No  . Attends Archivist Meetings: Never  . Marital Status: Never married   Outpatient Encounter Medications as of 11/15/2019  Medication Sig  . amLODipine (NORVASC) 10 MG tablet Take 1 tablet by mouth once daily  . aspirin 325 MG tablet Take 1 tablet (325 mg total) by mouth daily.  Marland Kitchen BAYER MICROLET LANCETS lancets Use as instructed  . clotrimazole (LOTRIMIN) 1 % cream Apply 1 application topically 2 (two) times daily.  .  dapagliflozin propanediol (FARXIGA) 5 MG TABS tablet Take 5 mg by mouth daily.  . famotidine (PEPCID) 20 MG tablet Take 1 tablet (20 mg total) by mouth 2 (two) times daily.  Marland Kitchen glucose blood (CONTOUR NEXT TEST) test strip Please use twice per day, once in the morning and once in the evening.  Elmore Guise Devices (MICROLET NEXT LANCING DEVICE) MISC 1 strip by Does not apply route every morning.  Marland Kitchen lisinopril-hydrochlorothiazide (ZESTORETIC) 20-12.5 MG tablet Take 2 tablets by mouth daily.  . metFORMIN (GLUCOPHAGE) 1000 MG tablet Take 1 tablet by mouth once daily with breakfast  . naproxen (NAPROSYN) 500 MG tablet Take 1 tablet by mouth twice daily  . nystatin (MYCOSTATIN/NYSTOP) powder Apply topically 4 (four) times daily.  . rosuvastatin (CRESTOR) 20 MG tablet Take 1 tablet (20 mg total) by mouth daily.  . Semaglutide,0.25 or 0.5MG /DOS, (OZEMPIC, 0.25 OR 0.5 MG/DOSE,) 2 MG/1.5ML SOPN Inject 0.25 mg into the skin once a week.  . sertraline (ZOLOFT) 50 MG tablet Take 1 tablet (50 mg total) by mouth daily.  . traMADol (ULTRAM) 50 MG tablet TAKE 1 TABLET BY MOUTH EVERY 6 HOURS AS NEEDED  . benzocaine (ORAJEL) 10 % mucosal gel Use as directed 1 application in the mouth or throat daily.  . [DISCONTINUED] metFORMIN (GLUCOPHAGE) 1000 MG tablet Take 1 tablet (1,000 mg total) by mouth daily with breakfast.   No facility-administered encounter medications on file as of 11/15/2019.   Activities of Daily Living In your present state of health, do you have any difficulty performing the following activities: 11/15/2019  Hearing? Y  Vision? N  Difficulty concentrating or making decisions? N  Walking or climbing stairs? N  Dressing or bathing? N  Doing errands, shopping? N  Preparing Food and eating ? N  Using the Toilet? N  In the past six months, have you accidently leaked urine? N  Do you have problems with loss of bowel control? N  Managing your Medications? N  Managing your Finances? N  Housekeeping or  managing your Housekeeping? N  Some recent data might be hidden   Patient Care Team: Kathrene Alu, MD as PCP - General (Family Medicine)    Assessment:   This is a routine wellness examination for [facility].  Exercise Activities and Dietary recommendations Current Exercise Habits: Home exercise routine, Type of exercise: walking, Time (Minutes): 15, Frequency (Times/Week): 2, Weekly Exercise (Minutes/Week): 30, Intensity: Mild, Exercise limited by: None identified  Goals    . Exercise 3x per week (30 min per time)     Patient is currently walking 30mins ~2x a week. 11/2019      Fall Risk Fall Risk  11/15/2019 08/14/2019 03/01/2019 09/26/2018 08/15/2018  Falls in the past year? 1 0 0 0 No  Number falls in past yr: 1 0 - - -  Injury with  Fall? 0 - - - -  Risk for fall due to : History of fall(s);Other (Comment) - - - -  Risk for fall due to: Comment ringing in ears - - - -  Follow up Falls prevention discussed Falls evaluation completed - - -   Is the patient's home free of loose throw rugs in walkways, pet beds, electrical cords, etc?  yes      Grab bars in the bathroom? yes      Handrails on the stairs?   yes      Adequate lighting?   yes  Patient rating of health (0-10) scale: 8  Depression Screen PHQ 2/9 Scores 11/15/2019 08/14/2019 03/01/2019 11/23/2018  PHQ - 2 Score 2 0 0 3  PHQ- 9 Score - - - 15  Exception Documentation - - - Other- indicate reason in comment box  Not completed - - - phq9 and gad given per provider request     Cognitive Function  6CIT Screen 11/15/2019  What Year? 0 points  What month? 0 points  What time? 0 points  Count back from 20 0 points  Months in reverse 0 points  Repeat phrase 4 points  Total Score 4   Immunization History  Administered Date(s) Administered  . Influenza Whole 09/19/2009  . Influenza,inj,Quad PF,6+ Mos 09/10/2016, 10/22/2017, 08/15/2018, 08/14/2019  . Pneumococcal Polysaccharide-23 11/09/2005, 10/22/2017  . Td 12/10/2005   . Tdap 09/10/2016   Screening Tests Health Maintenance  Topic Date Due  . OPHTHALMOLOGY EXAM  01/19/1974  . FOOT EXAM  11/24/2019  . HEMOGLOBIN A1C  01/05/2020  . MAMMOGRAM  04/17/2021  . PAP SMEAR-Modifier  08/13/2022  . TETANUS/TDAP  09/10/2026  . COLONOSCOPY  05/24/2028  . INFLUENZA VACCINE  Completed  . PNEUMOCOCCAL POLYSACCHARIDE VACCINE AGE 18-64 HIGH RISK  Completed  . Hepatitis C Screening  Completed  . HIV Screening  Completed   Cancer Screenings: Lung: Low Dose CT Chest recommended if Age 95-80 years, 30 pack-year currently smoking OR have quit w/in 15years. Patient does qualify. Breast:  Up to date on Mammogram? Yes   Up to date of Bone Density/Dexa? Yes Colorectal: UTD- 05/2028  Additional Screenings:  Hepatitis C Screening: Completed  HIV Screening: Completed  PNA Vaccine: Completed  Tdap Vaccine: Completed   Plan:  Increase your walking to 30 minutes per time 3x a week. Complete an advance directive packet.  I scheduled you a diabetes follow-up for February 1st @930am  with PCP.  I have personally reviewed and noted the following in the patient's chart:   . Medical and social history . Use of alcohol, tobacco or illicit drugs  . Current medications and supplements . Functional ability and status . Nutritional status . Physical activity . Advanced directives . List of other physicians . Hospitalizations, surgeries, and ER visits in previous 12 months . Vitals . Screenings to include cognitive, depression, and falls . Referrals and appointments  In addition, I have reviewed and discussed with patient certain preventive protocols, quality metrics, and best practice recommendations. A written personalized care plan for preventive services as well as general preventive health recommendations were provided to patient.  This visit was conducted virtually in the setting of the COVID19 pandemic.    , CMA  11/15/2019    I have reviewed this visit  and agree with the documentation.   Amanda C. 01/13/2020, MD PGY-3, [facility] 11/15/2019 9:37 PM

## 2019-12-11 ENCOUNTER — Ambulatory Visit (INDEPENDENT_AMBULATORY_CARE_PROVIDER_SITE_OTHER): Payer: Medicare (Managed Care) | Admitting: Family Medicine

## 2019-12-11 ENCOUNTER — Encounter: Payer: Self-pay | Admitting: Family Medicine

## 2019-12-11 ENCOUNTER — Other Ambulatory Visit: Payer: Self-pay

## 2019-12-11 VITALS — BP 130/96 | HR 73 | Wt 225.6 lb

## 2019-12-11 DIAGNOSIS — H906 Mixed conductive and sensorineural hearing loss, bilateral: Secondary | ICD-10-CM

## 2019-12-11 DIAGNOSIS — E119 Type 2 diabetes mellitus without complications: Secondary | ICD-10-CM | POA: Diagnosis not present

## 2019-12-11 LAB — POCT GLYCOSYLATED HEMOGLOBIN (HGB A1C): HbA1c, POC (controlled diabetic range): 8.1 % — AB (ref 0.0–7.0)

## 2019-12-11 MED ORDER — OZEMPIC (0.25 OR 0.5 MG/DOSE) 2 MG/1.5ML ~~LOC~~ SOPN: 0.2500 mg | PEN_INJECTOR | SUBCUTANEOUS | 1 refills | Status: DC

## 2019-12-11 MED ORDER — DAPAGLIFLOZIN PROPANEDIOL 10 MG PO TABS: 10.0000 mg | ORAL_TABLET | Freq: Every day | ORAL | 3 refills | Status: DC

## 2019-12-11 NOTE — Progress Notes (Addendum)
Subjective:    Kathleen Larson - 54 y.o. female MRN [id number]  Date of birth: [birth year]  CC:  Kathleen Larson is here for follow up of type 2 diabetes mellitus.  She would also like to discuss tinnitus and weight gain.  HPI: Type 2 Diabetes Mellitus: CBGs: 90s after breakfast Medications: metformin 1,000 mg once daily, has some mild stomach upset with this occasionally, Farxiga 5 mg, has not taken Ozempic in a while Hypo/hyperglycemic symptoms: none Diet and exercise: uses her air fryer, eating fruit and vegetables, drinks one soda per day, has gained 11 lbs since October 2020.  Wants to use her elliptial at home 30 minutes three times per week.  She also wants to use a smaller plate to control portion sizes.    Tinnitus: Has had this for over a year.  Has been told that she needs a hearing aid when she had her hearing tested in the past.  Occurs on the right.  Nonpulsatile.  Feels like she does not hear well.  No dizziness.  Health Maintenance: Health Maintenance Due  Topic Date Due  . OPHTHALMOLOGY EXAM  01/19/1974  . FOOT EXAM  11/24/2019    -  reports that she has never smoked. She has never used smokeless tobacco. - Review of Systems: Per HPI. - Past Medical History: Patient Active Problem List   Diagnosis Date Noted  . LGSIL on Pap smear of cervix 08/14/2019  . Healthcare maintenance 08/14/2019  . Yeast dermatitis 03/01/2019  . 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 Major depressive disorder, single episode, severe (HCC) 03/10/2018  . Esophageal dysphagia 03/10/2018  . Mild cognitive impairment 12/27/2017  . Lesion of tongue 10/22/2017  . 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 Tinnitus of both ears 06/21/2017  . Type 2 diabetes mellitus (HCC) 06/21/2017  . Greater trochanteric bursitis of right hip 05/26/2017  . Gait disorder 03/25/2017  . Neck muscle strain 09/10/2016  . Long term current use of  antithrombotics/antiplatelets 09/10/2016  . Essential hypertension, benign 05/27/2016  . Medical non-compliance   . Intracranial vascular stenosis   . Cerebrovascular accident (CVA) (HCC)   . Essential hypertension   . Obesity   . Abnormal EKG   . FATIGUE 01/06/2010  . CONSTIPATION 03/22/2009  . HELICOBACTER PYLORI INFECTION, HX OF 01/28/2009  . ALLERGIC RHINITIS 03/02/2008  . Hyperlipemia 07/18/2007   - Medications: reviewed and updated   Objective:   Physical Exam BP (!) 130/96   Pulse 73   Wt 225 lb 9.6 oz (102.3 kg)   LMP 10/14/2012   SpO2 99%   BMI 38.72 kg/m  Gen: NAD, alert, cooperative with exam, well-appearing, obese CV: RRR, good S1/S2, no murmur Resp: CTABL, no wheezes, non-labored    Assessment & Plan:   Type 2 diabetes mellitus (HCC) Worsening today with hemoglobin A1c of 8.1, up from 6.5.  Will increase Farxiga from 5 mg to 10 mg daily and restart Ozempic 0.25 mg weekly.  Due to patient's mild stomach upset with Metformin, we will keep this dose at 1000 mg daily.  Reviewed patient goals for weight loss and discussed the importance of this for diabetes management.  We will also obtain lipid panel today.  Mixed conductive and sensorineural hearing loss of both ears Tinnitus is likely a result of her hearing loss.  Encouraged patient to obtain a hearing aid, which will likely improve her quality of life.    Lezlie Octave, M.D. 12/12/2019, 8:52 AM PGY-3,  [place]

## 2019-12-11 NOTE — Patient Instructions (Signed)
It was nice seeing you today Ms. Duley!  Your goals for diet and exercise are: Eat dinner on a smaller plate, drink water when you feel hungry if you have just recently eaten, and reduce your soda intake to 1 every other day.  You also plan to use your elliptical machine 30 minutes 3 times per week.  For your diabetes, I am increasing your Farxiga from 5 mg to 10 mg once per day.  This will still be 1 pill once per day.  Please continue taking your Metformin once per day.  We are also restarting her Ozempic 1 injection/week, which is at the 0.25 mg dose.  Please let me know if you have any issues with this medication.    I believe that a hearing aid would benefit you greatly.  Please call the audiologist office to ask how to get started with this.  I would like to see you back in 3 months.  If you have any questions or concerns, please feel free to call the clinic.   Be well,  Dr. Frances Furbish

## 2019-12-12 LAB — LIPID PANEL
Chol/HDL Ratio: 4.6 ratio — ABNORMAL HIGH (ref 0.0–4.4)
Cholesterol, Total: 223 mg/dL — ABNORMAL HIGH (ref 100–199)
HDL: 48 mg/dL (ref >39)
LDL Chol Calc (NIH): 155 mg/dL — ABNORMAL HIGH (ref 0–99)
Triglycerides: 114 mg/dL (ref 0–149)
VLDL Cholesterol Cal: 20 mg/dL (ref 5–40)

## 2019-12-12 NOTE — Progress Notes (Signed)
Called patient to inform her of her results and asked if she takes Crestor every day.  She says that she takes it a few times per week but not every day.  I told her that she would benefit from taking this every day since her cholesterol is high and she has a history of a stroke.  She says she will start taking it every day from now on.  We will recheck her lipids at her next visit.

## 2019-12-12 NOTE — Assessment & Plan Note (Addendum)
Worsening today with hemoglobin A1c of 8.1, up from 6.5.  Will increase Farxiga from 5 mg to 10 mg daily and restart Ozempic 0.25 mg weekly.  Due to patient's mild stomach upset with Metformin, we will keep this dose at 1000 mg daily.  Reviewed patient goals for weight loss and discussed the importance of this for diabetes management.  We will also obtain lipid panel today.

## 2019-12-12 NOTE — Assessment & Plan Note (Addendum)
Tinnitus is likely a result of her hearing loss.  Encouraged patient to obtain a hearing aid, which will likely improve her quality of life.

## 2020-01-09 ENCOUNTER — Other Ambulatory Visit: Payer: Self-pay | Admitting: Family Medicine

## 2020-01-13 ENCOUNTER — Other Ambulatory Visit: Payer: Self-pay | Admitting: Family Medicine

## 2020-01-13 DIAGNOSIS — I10 Essential (primary) hypertension: Secondary | ICD-10-CM

## 2020-03-08 ENCOUNTER — Other Ambulatory Visit: Payer: Self-pay | Admitting: Family Medicine

## 2020-03-08 DIAGNOSIS — E119 Type 2 diabetes mellitus without complications: Secondary | ICD-10-CM

## 2020-03-14 ENCOUNTER — Other Ambulatory Visit: Payer: Self-pay

## 2020-03-14 ENCOUNTER — Encounter: Payer: Self-pay | Admitting: Family Medicine

## 2020-03-14 ENCOUNTER — Telehealth: Payer: Self-pay | Admitting: *Deleted

## 2020-03-14 ENCOUNTER — Ambulatory Visit (INDEPENDENT_AMBULATORY_CARE_PROVIDER_SITE_OTHER): Payer: Medicare (Managed Care) | Admitting: Family Medicine

## 2020-03-14 VITALS — BP 102/74 | HR 72 | Ht 64.0 in | Wt 217.4 lb

## 2020-03-14 DIAGNOSIS — Z8673 Personal history of transient ischemic attack (TIA), and cerebral infarction without residual deficits: Secondary | ICD-10-CM | POA: Diagnosis not present

## 2020-03-14 DIAGNOSIS — E119 Type 2 diabetes mellitus without complications: Secondary | ICD-10-CM

## 2020-03-14 DIAGNOSIS — E1169 Type 2 diabetes mellitus with other specified complication: Secondary | ICD-10-CM | POA: Diagnosis not present

## 2020-03-14 DIAGNOSIS — Z9181 History of falling: Secondary | ICD-10-CM | POA: Insufficient documentation

## 2020-03-14 DIAGNOSIS — E1140 Type 2 diabetes mellitus with diabetic neuropathy, unspecified: Secondary | ICD-10-CM

## 2020-03-14 DIAGNOSIS — F322 Major depressive disorder, single episode, severe without psychotic features: Secondary | ICD-10-CM

## 2020-03-14 DIAGNOSIS — M2392 Unspecified internal derangement of left knee: Secondary | ICD-10-CM

## 2020-03-14 LAB — POCT GLYCOSYLATED HEMOGLOBIN (HGB A1C): HbA1c, POC (prediabetic range): 6.4 % (ref 5.7–6.4)

## 2020-03-14 MED ORDER — METFORMIN HCL ER 500 MG PO TB24: 1000.0000 mg | ORAL_TABLET | Freq: Every day | ORAL | 3 refills | Status: DC

## 2020-03-14 MED ORDER — NAPROXEN 500 MG PO TABS: 500.0000 mg | ORAL_TABLET | Freq: Two times a day (BID) | ORAL | 3 refills | Status: DC

## 2020-03-14 MED ORDER — TRAMADOL HCL 50 MG PO TABS: 50.0000 mg | ORAL_TABLET | Freq: Four times a day (QID) | ORAL | 5 refills | Status: DC | PRN

## 2020-03-14 MED ORDER — MICROLET NEXT LANCING DEVICE MISC: 1.0000 | Freq: Every morning | 3 refills | Status: DC

## 2020-03-14 MED ORDER — CONTOUR NEXT TEST VI STRP: ORAL_STRIP | 12 refills | Status: DC

## 2020-03-14 NOTE — Assessment & Plan Note (Signed)
Continue wearing shoes with good support, using walker/cain, and wearing glasses regularly. Get hearing aid. As most falls are caused by buckling of her R knee, we will keep an eye on her knee. She is not interested in an x-ray or PT at this time.

## 2020-03-14 NOTE — Progress Notes (Signed)
SUBJECTIVE:   CHIEF COMPLAINT / HPI:   Diabetes: Fasting morning blood sugars range from 95-104. Overall feeling well. Taking metformin and farxiga as prescribed. Tolerating Marcelline Deist well but does not like the GI side effect of metformin. No episodes of hypoglycemia or hyperglycemia. Diet and exercise is "not good" as she is home most of the time because of COVID, however she has started air frying food instead of frying. Endorses tingling in her legs and feet bilaterally. Has tingling and numbness on R side due to stroke in 2017. States that her feet feel cold with little circulation and loss of sensation on outer portion on L foot. No changes in vision.  History of stroke: Taking rosuvastatin and aspirin daily. Last lipid panel in 12/2019 which showed elevated LDL and total cholesterol. She was not taking her statin regularly at that time  Falls: fell 5 times in the last 6 months. Feels pretty steady on her feet but feels like L knee is buckling.No arthritis or injury to L knee. No fear of falling. Is able to get up quickly after falls. Has caine and walker and uses regularly. Good foot support shoes. No LOC or injury after fall. Falls are usually caused knee buckling or when she is leaning over to grab something off the ground. Endorses Tinnitus. Had hearing checked and is getting a hearing aid.  Vision - wears glasses and is going to eye doc soon.   MDD: PHQ9 at 86 today. No SI/HI. She is feeling low today as is usual for her around mother's day. She is not taking her sertraline everyday, only as needed for episodes of depression. She is not interested in talking to a therapist.   PERTINENT  PMH / PSH: History of stroke, T2DM, tinnitus of both ears, MDD  OBJECTIVE:   BP 102/74   Pulse 72   Ht 5\' 4"  (1.626 m)   Wt 217 lb 6.4 oz (98.6 kg)   LMP 10/14/2012   SpO2 99%   BMI 37.32 kg/m   Physical Exam  Constitutional: She is oriented to person, place, and time and well-developed,  well-nourished, and in no distress. No distress.  Eyes: Pupils are equal, round, and reactive to light. Conjunctivae are normal.  Cardiovascular: Normal rate, regular rhythm, normal heart sounds and intact distal pulses.  Pulmonary/Chest: Effort normal and breath sounds normal.  Musculoskeletal:        General: Normal range of motion.     Comments: Diabetes foot exam: Pedal pulses 2+ bilaterally.  Monofilament test showed decreased sensation over ball of right foot and decreased sensation over outer edge of left foot. Beginning of callous formation on ball of left foot and heel of right foot  Neurological: She is alert and oriented to person, place, and time.  Skin: She is not diaphoretic.  Psychiatric: Affect normal.     ASSESSMENT/PLAN:   At risk for falls Continue wearing shoes with good support, using walker/cain, and wearing glasses regularly. Get hearing aid. As most falls are caused by buckling of her R knee, we will keep an eye on her knee. She is not interested in an x-ray or PT at this time.  Major depressive disorder, single episode, severe (HCC) Counseled on taking sertraline everyday as prescribed. Call back if experiencing any side effects. Will reevaluate at next visit.  History of stroke Reviewed the importance of the statin and aspirin. Rechecking lipid panel today now that she has been taking these regularly  Type 2 diabetes mellitus  with other specified complication ([place]) T6L at 6.4 today, down from 8.1 in 12/2019. Counseled on good eating and exercise diet. Changing her metformin to 1000mg  XR to see if this helps with the gassiness she is experiencing. She may be experiencing mild neuropathy from her diabetes. She is not interested in starting Gabapentin at this time. We will continue to monitor this.      [place]

## 2020-03-14 NOTE — Assessment & Plan Note (Addendum)
Reviewed the importance of the statin and aspirin. Rechecking lipid panel today now that she has been taking these regularly Phone follow up of labs.  Patient admits not taking crestor regularly.  Knows importance.  Will start.  Recheck direct LDL 6-12 weeks post restarting crestor.

## 2020-03-14 NOTE — Telephone Encounter (Signed)
Received fax with note to prescriber: You wrote for the device, but did you mean to do lancets? Please clarify. Thank you! This was from the [facility]. Gessica Jawad Zimmerman Rumple, CMA

## 2020-03-14 NOTE — Assessment & Plan Note (Signed)
Counseled on taking sertraline everyday as prescribed. Call back if experiencing any side effects. Will reevaluate at next visit.

## 2020-03-14 NOTE — Patient Instructions (Addendum)
It was great seeing you today, thanks for coming in.  Today we talked about:  Your A1c is at 6.4 today. Changing metformin to 1000mg  XR to see if that helps with the gas. Make sure you are taking your cholesterol medication (rosuvastatin) and aspirin every day as prescribed. We will re-check your cholesterol today. Make sure you take the sertraline everyday as prescribed. Call back if you experience any side effects. If your knee worsens, let us know and we will set up some imaging.  Thank you, Celita Aron, Medical Student

## 2020-03-14 NOTE — Assessment & Plan Note (Addendum)
A1c at 6.4 today, down from 8.1 in 12/2019. Counseled on good eating and exercise diet. Changing her metformin to 1000mg  XR to see if this helps with the gassiness she is experiencing. She may be experiencing mild neuropathy from her diabetes. She is not interested in starting Gabapentin at this time. We will continue to monitor this. Follow up in 3 months

## 2020-03-15 ENCOUNTER — Encounter: Payer: Self-pay | Admitting: Family Medicine

## 2020-03-15 DIAGNOSIS — E1140 Type 2 diabetes mellitus with diabetic neuropathy, unspecified: Secondary | ICD-10-CM | POA: Insufficient documentation

## 2020-03-15 DIAGNOSIS — M2392 Unspecified internal derangement of left knee: Secondary | ICD-10-CM | POA: Insufficient documentation

## 2020-03-15 LAB — LDL CHOLESTEROL, DIRECT: LDL Direct: 143 mg/dL — ABNORMAL HIGH (ref 0–99)

## 2020-03-15 MED ORDER — BAYER MICROLET LANCETS MISC: 12 refills | Status: DC

## 2020-03-15 NOTE — Telephone Encounter (Signed)
I am thinking Dr. Leveda Anna may have ordered these since he saw the patient yesterday and it appears that he ordered several diabetes supplies.

## 2020-03-15 NOTE — Assessment & Plan Note (Signed)
Monitor.  Not interested in ortho referral

## 2020-03-15 NOTE — Progress Notes (Signed)
See my attestation.

## 2020-03-15 NOTE — Assessment & Plan Note (Signed)
Neuropathy based on foot exam.

## 2020-03-15 NOTE — Telephone Encounter (Signed)
Yes, my error.  I meant to sent lancets.  I will resend.

## 2020-03-19 ENCOUNTER — Telehealth: Payer: Self-pay

## 2020-03-19 MED ORDER — ONETOUCH VERIO W/DEVICE KIT: PACK | 0 refills | Status: AC

## 2020-03-19 MED ORDER — ONETOUCH DELICA LANCETS 33G MISC: 12 refills | Status: DC

## 2020-03-19 MED ORDER — ONETOUCH VERIO VI STRP: ORAL_STRIP | 12 refills | Status: DC

## 2020-03-19 MED ORDER — ONETOUCH DELICA LANCING DEV MISC: 1 refills | Status: DC

## 2020-03-19 NOTE — Telephone Encounter (Signed)
Received fax from pharmacy requesting glucometer supplies, per insurance coverage. Patient has Medicare coverage, glucometer covered is [employer] loss [employer]er. Sent supplies into pharmacy.   FYI to PCP  Veronda Prude, RN

## 2020-03-19 NOTE — Telephone Encounter (Signed)
Received fax from pharmacy, PA needed on Tramadol. Clinical questions submitted via Cover My Meds. Waiting on response, could take up to 72 hours.  Cover My Meds info: Key: [id number]

## 2020-03-19 NOTE — Telephone Encounter (Signed)
Thank you Hannah

## 2020-03-20 NOTE — Telephone Encounter (Signed)
LMOVM of Pharmacy

## 2020-03-27 ENCOUNTER — Other Ambulatory Visit: Payer: Self-pay | Admitting: Family Medicine

## 2020-03-27 DIAGNOSIS — I10 Essential (primary) hypertension: Secondary | ICD-10-CM

## 2020-03-28 ENCOUNTER — Other Ambulatory Visit: Payer: Self-pay | Admitting: *Deleted

## 2020-03-28 DIAGNOSIS — E119 Type 2 diabetes mellitus without complications: Secondary | ICD-10-CM

## 2020-03-28 MED ORDER — OZEMPIC (0.25 OR 0.5 MG/DOSE) 2 MG/1.5ML ~~LOC~~ SOPN: PEN_INJECTOR | SUBCUTANEOUS | 0 refills | Status: DC

## 2020-04-01 ENCOUNTER — Telehealth: Payer: Self-pay | Admitting: *Deleted

## 2020-04-01 NOTE — Telephone Encounter (Signed)
Received fax requesting a 90 day supply of pts Ozempic.Landy Mace Zimmerman Rumple, CMA

## 2020-04-03 ENCOUNTER — Other Ambulatory Visit: Payer: Self-pay | Admitting: Family Medicine

## 2020-04-03 DIAGNOSIS — E119 Type 2 diabetes mellitus without complications: Secondary | ICD-10-CM

## 2020-04-03 MED ORDER — OZEMPIC (0.25 OR 0.5 MG/DOSE) 2 MG/1.5ML ~~LOC~~ SOPN: 0.2500 mg | PEN_INJECTOR | SUBCUTANEOUS | 3 refills | Status: DC

## 2020-04-03 NOTE — Telephone Encounter (Signed)
New prescription sent. Thanks

## 2020-05-01 ENCOUNTER — Telehealth: Payer: Self-pay | Admitting: *Deleted

## 2020-05-01 NOTE — Telephone Encounter (Signed)
Received fax requesting a 90 day supply for pts Ozempic. Kathleen Larson, CMA

## 2020-05-02 ENCOUNTER — Other Ambulatory Visit: Payer: Self-pay | Admitting: Family Medicine

## 2020-05-02 DIAGNOSIS — E119 Type 2 diabetes mellitus without complications: Secondary | ICD-10-CM

## 2020-05-02 MED ORDER — OZEMPIC (0.25 OR 0.5 MG/DOSE) 2 MG/1.5ML ~~LOC~~ SOPN: 0.2500 mg | PEN_INJECTOR | SUBCUTANEOUS | 3 refills | Status: AC

## 2020-05-02 NOTE — Telephone Encounter (Signed)
Ozempic refilled.  Thank you!

## 2020-06-27 ENCOUNTER — Encounter: Payer: Self-pay | Admitting: Family Medicine

## 2020-06-27 ENCOUNTER — Other Ambulatory Visit: Payer: Self-pay

## 2020-06-27 ENCOUNTER — Ambulatory Visit (INDEPENDENT_AMBULATORY_CARE_PROVIDER_SITE_OTHER): Payer: Medicare (Managed Care) | Admitting: Family Medicine

## 2020-06-27 VITALS — BP 110/80 | HR 78 | Wt 221.4 lb

## 2020-06-27 DIAGNOSIS — E1169 Type 2 diabetes mellitus with other specified complication: Secondary | ICD-10-CM

## 2020-06-27 DIAGNOSIS — E1140 Type 2 diabetes mellitus with diabetic neuropathy, unspecified: Secondary | ICD-10-CM

## 2020-06-27 DIAGNOSIS — E119 Type 2 diabetes mellitus without complications: Secondary | ICD-10-CM

## 2020-06-27 DIAGNOSIS — Z8673 Personal history of transient ischemic attack (TIA), and cerebral infarction without residual deficits: Secondary | ICD-10-CM | POA: Diagnosis not present

## 2020-06-27 DIAGNOSIS — E785 Hyperlipidemia, unspecified: Secondary | ICD-10-CM

## 2020-06-27 DIAGNOSIS — B372 Candidiasis of skin and nail: Secondary | ICD-10-CM

## 2020-06-27 DIAGNOSIS — Z9181 History of falling: Secondary | ICD-10-CM

## 2020-06-27 LAB — POCT GLYCOSYLATED HEMOGLOBIN (HGB A1C): HbA1c, POC (controlled diabetic range): 7 % (ref 0.0–7.0)

## 2020-06-27 MED ORDER — METFORMIN HCL ER (MOD) 1000 MG PO TB24: 1000.0000 mg | ORAL_TABLET | Freq: Every day | ORAL | 2 refills | Status: DC

## 2020-06-27 NOTE — Assessment & Plan Note (Addendum)
A1c today 7.0. Will convert metformin to one 1,000 mg tablet daily from two 500 mg per patient request. No hypoglycemic episodes. Discussed stopping daily finger sticks. Since A1c is stable and no medication changes, will follow up in six months. Performed annual foot exam today. Referred for diabetic eye exam.

## 2020-06-27 NOTE — Progress Notes (Signed)
SUBJECTIVE:   CHIEF COMPLAINT / HPI:  Kathleen Larson is a pleasant 54 year old female who presents to clinic today for a follow up of her A1c and cholesterol.  Diabetes mellitus type 2 Her point-of-care A1c today is 7.0.  She is doing well on the Metformin extended release since May, reports no GI upset like diarrhea.  No episodes of hypoglycemia, dizziness, loss of consciousness.  Fingerstick blood sugar at home ranges between 80 and 102.  When she feels like she has low blood sugar, she checks it and it is usually around 80. She believes her bilateral infra-mammary rashes are not resolving because of this Metformin.  High cholesterol She has had good adherence with her rosuvastatin 20 mg daily since May.  She has not had any GI upset such as diarrhea, muscle aches, or muscle cramps.  She asks when she can stop taking cholesterol medicine, we discussed that this will likely be a lifelong preventative measure to prevent another stroke.  Bilateral inframammary rash Congruent with Dr. Cyndia Skeeters note back in May.  Appears to be yeast type rash.  She states she has been using the nystatin powder but not the Lotrimin cream.  She has been unable to completely resolve this rash and she wonders if her Metformin is contributing to this.  Falls One fall since May. It happens when she sometimes gest dizzy while bending over. Doesn't get dizzy every time she leans forward, only some of the time. No injuries or loss of consciousness associated with falls.   PERTINENT  PMH / PSH: Hypertension, history of stroke, type 2 diabetes, yeast dermatitis, hyperlipidemia  OBJECTIVE:   BP 110/80   Pulse 78   Wt 221 lb 6.4 oz (100.4 kg)   LMP 10/14/2012   SpO2 97%   BMI 38.00 kg/m     PHQ-9:  Depression screen [facility] 2/9 06/27/2020 03/14/2020 12/11/2019  Decreased Interest 1 2 1   Down, Depressed, Hopeless 1 2 0  PHQ - 2 Score 2 4 1   Altered sleeping 2 - -  Tired, decreased energy 1 - -  Change in appetite 1 - -   Feeling bad or failure about yourself  2 - -  Trouble concentrating 1 - -  Moving slowly or fidgety/restless 2 - -  Suicidal thoughts 0 - -  PHQ-9 Score 11 - -  Difficult doing work/chores Somewhat difficult - -  Some recent data might be hidden     GAD-7:  GAD 7 : Generalized Anxiety Score 06/27/2020 03/10/2018 12/22/2017  Nervous, Anxious, on Edge 1 3 1   Control/stop worrying 2 2 3   Worry too much - different things 2 1 1   Trouble relaxing 1 1 3   Restless 1 1 1   Easily annoyed or irritable 1 2 1   Afraid - awful might happen 1 3 1   Total GAD 7 Score 9 13 11   Anxiety Difficulty Somewhat difficult Extremely difficult Not difficult at all      Physical Exam General: Awake, alert, oriented Cardiovascular: Regular rate and rhythm, S1 and S2 present, no murmurs auscultated Respiratory: Lung fields clear to auscultation bilaterally Extremities: No bilateral lower extremity edema, palpable pedal and pretibial pulses bilaterally Neuro: Cranial nerves II through X grossly intact, able to move all extremities spontaneously Integument: Bilateral inframammary rashes with hyperpigmented plaques and patches with satellite lesions.  Nystatin powder evident bilaterally over rashes.   ASSESSMENT/PLAN:   Type 2 diabetes mellitus with diabetic neuropathy, unspecified (HCC) A1c today 7.0. Will convert metformin  to one 1,000 mg tablet daily from two 500 mg per patient request. No hypoglycemic episodes. Discussed stopping daily finger sticks. Since A1c is stable and no medication changes, will follow up in six months. Performed annual foot exam today. Referred for diabetic eye exam.   Hyperlipemia Good adherence with rosuvastatin 20 mg since May. Will recheck direct LDL today after visit. Depending on result, may increase to 40 mg daily. Balance need for cholesterol control due to personal history of stroke with family history of death from aneurysm (brother and father).   At risk for falls One fall  since last visit in May. No injuries or loss of consciousness from the fall. Sometimes she gets dizzy when she bends over, and that is when she falls.   Yeast dermatitis Bilateral inframammary rashes with hyperpigmented plaques and patches plus satellite lesions. Plentiful nystatin powder present on exam. Discussed importance of keeping area clean and dry, also applying nystatin powder.      Fayette Pho, MD [facility]

## 2020-06-27 NOTE — Progress Notes (Deleted)
    SUBJECTIVE:   CHIEF COMPLAINT / HPI:   ***  PERTINENT  PMH / PSH: ***  OBJECTIVE:   BP 110/80   Pulse 78   Wt 221 lb 6.4 oz (100.4 kg)   LMP 10/14/2012   SpO2 97%   BMI 38.00 kg/m     PHQ-9:  Depression screen [facility] 2/9 06/27/2020 03/14/2020 12/11/2019  Decreased Interest 1 2 1   Down, Depressed, Hopeless 1 2 0  PHQ - 2 Score 2 4 1   Altered sleeping 2 - -  Tired, decreased energy 1 - -  Change in appetite 1 - -  Feeling bad or failure about yourself  2 - -  Trouble concentrating 1 - -  Moving slowly or fidgety/restless 2 - -  Suicidal thoughts 0 - -  PHQ-9 Score 11 - -  Difficult doing work/chores Somewhat difficult - -  Some recent data might be hidden     GAD-7:  GAD 7 : Generalized Anxiety Score 06/27/2020 03/10/2018 12/22/2017  Nervous, Anxious, on Edge 1 3 1   Control/stop worrying 2 2 3   Worry too much - different things 2 1 1   Trouble relaxing 1 1 3   Restless 1 1 1   Easily annoyed or irritable 1 2 1   Afraid - awful might happen 1 3 1   Total GAD 7 Score 9 13 11   Anxiety Difficulty Somewhat difficult Extremely difficult Not difficult at all      Physical Exam General: Awake, alert, oriented Cardiovascular: Regular rate and rhythm, S1 and S2 present, no murmurs auscultated Respiratory: Lung fields clear to auscultation bilaterally Extremities: No bilateral lower extremity edema, palpable pedal and pretibial pulses bilaterally Neuro: Cranial nerves II through X grossly intact, able to move all extremities spontaneously   ASSESSMENT/PLAN:   No problem-specific Assessment & Plan notes found for this encounter.     05/10/2018, MD [facility]

## 2020-06-27 NOTE — Assessment & Plan Note (Signed)
Bilateral inframammary rashes with hyperpigmented plaques and patches plus satellite lesions. Plentiful nystatin powder present on exam. Discussed importance of keeping area clean and dry, also applying nystatin powder.

## 2020-06-27 NOTE — Assessment & Plan Note (Signed)
Good adherence with rosuvastatin 20 mg since May. Will recheck direct LDL today after visit. Depending on result, may increase to 40 mg daily. Balance need for cholesterol control due to personal history of stroke with family history of death from aneurysm (brother and father).

## 2020-06-27 NOTE — Patient Instructions (Addendum)
Thank you so much for allowing me to be a part of your care today. It was very nice to meet you. Below is a summary of what we talked about today at your clinic visit:   Diabetes Your A1c today was 7.0, which is at goal!  We are aiming to have it under 8.  Please continue to take all of your diabetes medicines.  I will convert your Metformin to 1 extended release tab per day instead of 2 tabs.  Also, you may stop checking your fingerstick blood sugar every day if you are comfortable with that.  Only check it when you feel low.  We will continue to monitor your blood sugars with A1c measurements.  Since your A1c is stable and we are not changing any medicines, we can check your A1c again in 6 months.  I have referred you for your annual diabetic eye exam.  Somebody from our office should be contacting you soon to coordinate that.  High cholesterol We will check a single measurement of your cholesterol today to see how your body is responding to your statin medication.  Given the results, we may keep you on the same dose or bump you to the next higher dose.  I will give you call with instructions once we have your cholesterol results.  You are on this cholesterol medication to reduce your risk of having another stroke, so it will likely be for life.  Remember, let us know if you are having any side effects like muscle cramps or muscle aches.  Falls With only 1 fall since May, it seems like you are doing better.  Please let us know if you have trouble with increased falls or have an injury from a fall.  Skin rash Your skin rash looks similar to what Dr. Leveda Anna discussed with you back in May.  Continue using the nystatin powder and Lotrimin ointment for control.  Keep the area clean and dry.  Use a well fitting, cotton bra as this fabric will allow your skin to breathe. Clean the area with a simple soap without perfumes or dyes, like Dove or Olay. Pat dry or dry with a fan and then apply the nystatin  powder to dry skin.   As always, please not hesitate to contact us if you have any problems or concerns.  You may contact us via phone call, my chart message, or the after hours urgent care line.  Fayette Pho, MD

## 2020-06-27 NOTE — Assessment & Plan Note (Signed)
One fall since last visit in May. No injuries or loss of consciousness from the fall. Sometimes she gets dizzy when she bends over, and that is when she falls.

## 2020-06-28 ENCOUNTER — Encounter: Payer: Self-pay | Admitting: Family Medicine

## 2020-06-28 ENCOUNTER — Other Ambulatory Visit: Payer: Self-pay | Admitting: Family Medicine

## 2020-06-28 ENCOUNTER — Telehealth: Payer: Self-pay

## 2020-06-28 DIAGNOSIS — E119 Type 2 diabetes mellitus without complications: Secondary | ICD-10-CM

## 2020-06-28 DIAGNOSIS — E1169 Type 2 diabetes mellitus with other specified complication: Secondary | ICD-10-CM

## 2020-06-28 LAB — LDL CHOLESTEROL, DIRECT: LDL Direct: 64 mg/dL (ref 0–99)

## 2020-06-28 MED ORDER — METFORMIN HCL ER 750 MG PO TB24: 750.0000 mg | ORAL_TABLET | Freq: Every day | ORAL | 2 refills | Status: DC

## 2020-06-28 MED ORDER — METFORMIN HCL ER 500 MG PO TB24: 1000.0000 mg | ORAL_TABLET | Freq: Every day | ORAL | 3 refills | Status: DC

## 2020-06-28 NOTE — Telephone Encounter (Signed)
Fax from [facility]:  Glumetza 1000 mg tab  "not covered by insurance. They prefer Metforming ER 750 mg or generic Glucophage."  Sunday Spillers, CMA

## 2020-06-28 NOTE — Progress Notes (Signed)
Received a note from pharmacy saying that the Glumetza (1,000 mg of metformin 24 hour extended release) was not covered by her insurance. Recommended for insurance coverage to switch to either metformin XR 750 mg or generic Glucophage IR. As she was switched off regular immediate release metformin in May due to GI upset and has been doing well since, I do not want to reverse her progress.   After calling her pharmacy to discuss this situation, I have elected to switch the prescription to generic metformin extended release 500 mg and instructing her to take two tablets.   Fayette Pho, MD

## 2020-07-10 ENCOUNTER — Other Ambulatory Visit: Payer: Self-pay | Admitting: *Deleted

## 2020-07-10 DIAGNOSIS — I10 Essential (primary) hypertension: Secondary | ICD-10-CM

## 2020-07-10 MED ORDER — LISINOPRIL-HYDROCHLOROTHIAZIDE 20-12.5 MG PO TABS: 2.0000 | ORAL_TABLET | Freq: Every day | ORAL | 0 refills | Status: DC

## 2020-09-13 ENCOUNTER — Other Ambulatory Visit: Payer: Self-pay | Admitting: Family Medicine

## 2020-09-13 DIAGNOSIS — Z1231 Encounter for screening mammogram for malignant neoplasm of breast: Secondary | ICD-10-CM

## 2020-09-19 ENCOUNTER — Encounter: Payer: Self-pay | Admitting: Family Medicine

## 2020-09-19 ENCOUNTER — Other Ambulatory Visit: Payer: Self-pay

## 2020-09-19 ENCOUNTER — Ambulatory Visit (INDEPENDENT_AMBULATORY_CARE_PROVIDER_SITE_OTHER): Payer: Medicare (Managed Care) | Admitting: Family Medicine

## 2020-09-19 VITALS — BP 106/80 | HR 76 | Ht 64.0 in | Wt 217.1 lb

## 2020-09-19 DIAGNOSIS — M25362 Other instability, left knee: Secondary | ICD-10-CM | POA: Insufficient documentation

## 2020-09-19 DIAGNOSIS — E1169 Type 2 diabetes mellitus with other specified complication: Secondary | ICD-10-CM | POA: Diagnosis not present

## 2020-09-19 DIAGNOSIS — Z23 Encounter for immunization: Secondary | ICD-10-CM | POA: Diagnosis not present

## 2020-09-19 DIAGNOSIS — Z0000 Encounter for general adult medical examination without abnormal findings: Secondary | ICD-10-CM

## 2020-09-19 HISTORY — DX: Other instability, left knee: M25.362

## 2020-09-19 LAB — POCT GLYCOSYLATED HEMOGLOBIN (HGB A1C): HbA1c, POC (controlled diabetic range): 6.9 % (ref 0.0–7.0)

## 2020-09-19 NOTE — Assessment & Plan Note (Signed)
Frequent knee "giving out" sensation for the past 4 months, resulting in a few falls recently. Reassuringly unremarkable knee exam today, no appreciated laxity. However, given her history is possibly concerning for ligamentous/meniscal pathology, recommended additional evaluation by sports medicine and placed referral. In the interim, provided quad/hip strengthening exercises. May continue ice/heat as needed.

## 2020-09-19 NOTE — Progress Notes (Signed)
    SUBJECTIVE:   CHIEF COMPLAINT / HPI: F/u from fall, bruising  Kathleen Larson is a 54 year old female presenting for follow-up after a fall.   Reports she fell onto concrete this Monday, 11/8, after her left knee suddenly gave out on her and landed on this knee. Reports this isn't the first time that her left knee is giving out on her, it also resulted in a fall back in September and has had a few other times of it completely giving out. She is very frustrated that her knee keeps doing this on the left for about the past 4 months, in between the times it gives out she does not have any pain or discomfort. Will hear popping on occasion. Feels like the joint maybe locked once before. She denies any concurrent knee swelling, weakness, or numbness. Can walk normal without any assistive device, does not drag feet on either side.   Fortunately, she feels that she is doing well after the fall. She does have a bruise on the front of her knee where it is sore but otherwise is not having much discomfort. Also scraped her left pointer finger and has been putting alcohol and a Band-Aid on it.  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 No swelling/effusion, or erythema. Small amount of bruising over anterior patella. Skin intact - Palpation: Mild TTP over patella bruising without  any tenderness along joint lines - ROM: full active ROM with flexion and extension in knee and hip, no popping or locking noted - Strength: 5/5 strength on the left, 4/5 strength on the right at baseline - Neuro/vasc: NV intact - Special Tests: - LIGAMENTS: negative Lachman's, no MCL or LCL laxity  -- MENISCUS: negative McMurray's, negative Thessaly bilaterally (reports soreness in her lateral thigh with this only) -- PF JOINT: nml patellar mobility bilaterally, unable to test patellar grind due to patellar bruising.   ASSESSMENT/PLAN:   Knee buckling, left Frequent knee "giving out" sensation for the past 4 months, resulting in a few falls recently. Reassuringly unremarkable knee exam today, no appreciated laxity. However, given her history is possibly concerning for ligamentous/meniscal pathology, recommended additional evaluation by sports medicine and placed referral. In the interim, provided quad/hip strengthening exercises. May continue ice/heat as needed.  Type 2 diabetes mellitus with other specified complication (HCC) A1c 6.1. Well-controlled. Continue Metformin as is and follow-up with PCP on regular basis.  Healthcare maintenance Received flu shot today    Recommended Vaseline and Band-Aid as needed on her finger, educated on common s/sx of infection.  Follow-up if not improving or sooner if worsening especially if any development of weakness, severe numbness, or persistent locking. Otherwise should follow up with PCP for routine chronic conditions.  14/04/2012, DO [facility]

## 2020-09-19 NOTE — Assessment & Plan Note (Signed)
A1c 6.1. Well-controlled. Continue Metformin as is and follow-up with PCP on regular basis.

## 2020-09-19 NOTE — Assessment & Plan Note (Signed)
Received flu shot today. 

## 2020-09-19 NOTE — Patient Instructions (Signed)
It was wonderful to see you today.  I would like you to start doing strengthening exercises around your thighs and hips.  Also especially since this is not the first time that your knee has locked, I would like you to see sports medicine for further evaluation.  I have placed a referral over to sports medicine which is just right around the corner from our office.   In the meantime you can ice your knee 20 minutes at a time several times a day.

## 2020-09-23 ENCOUNTER — Ambulatory Visit: Payer: Medicare (Managed Care) | Admitting: Family Medicine

## 2020-10-02 ENCOUNTER — Other Ambulatory Visit: Payer: Self-pay | Admitting: Family Medicine

## 2020-10-02 DIAGNOSIS — I10 Essential (primary) hypertension: Secondary | ICD-10-CM

## 2020-10-18 ENCOUNTER — Other Ambulatory Visit: Payer: Self-pay

## 2020-10-18 ENCOUNTER — Ambulatory Visit (INDEPENDENT_AMBULATORY_CARE_PROVIDER_SITE_OTHER): Payer: Medicare (Managed Care) | Admitting: Family Medicine

## 2020-10-18 VITALS — BP 120/86 | Ht 64.0 in | Wt 210.0 lb

## 2020-10-18 DIAGNOSIS — M25561 Pain in right knee: Secondary | ICD-10-CM | POA: Diagnosis not present

## 2020-10-18 DIAGNOSIS — M25562 Pain in left knee: Secondary | ICD-10-CM | POA: Insufficient documentation

## 2020-10-18 HISTORY — DX: Pain in left knee: M25.562

## 2020-10-18 NOTE — Progress Notes (Signed)
    SUBJECTIVE:   CHIEF COMPLAINT / HPI:   Left knee: she has had left knee pain for 6 months.  Sitting or standing for long periods of time makes it worse.  It is whole knee pain.  She has fallen 'a good bit of times' in the past few months.  Does not recall an injury, does not have physically demanding job or exercise.  She had a stroke affecting her right side on July 2017.  Not a syncopal episode.  She takes tramadol 1-2 a day 50mg  tablets. No left hip pain.  Some numbness in the 5th digit on the left foot that comes and goes.    PERTINENT  PMH / PSH: CVA affecting right side.    OBJECTIVE:   BP 120/86   Ht 5\' 4"  (1.626 m)   Wt 210 lb (95.3 kg)   LMP 10/14/2012   BMI 36.05 kg/m   Gen: alert, oriented.  No acute distress MSK: knees symmetric in size bilaterally.  No bruising.  4/5 strength on the right leg, 5/5 strength on the left leg, most noticeable with knee flexion, extension, and dorsi/plantar flexion. No knee crepitus.  Normal ROM w/ seated external/internal rotation bilaterally.  Tenderness on the lateral left knee with internal hip rotation. Positive thessaly on the left leg w/ pain in the lateral knee.   ASSESSMENT/PLAN:   No problem-specific Assessment & Plan notes found for this encounter.     , MD [facility]

## 2020-10-18 NOTE — Assessment & Plan Note (Signed)
History of pain with buckling and PE w/ positive thessaly consistent with meniscal injury of the left lateral knee.  Will get MRI to help localize the injury and differentiate from other causes including osteoarthritis.   - MRI knee.  If unable to afford this, pt advised to call and schedule appt w/ sports med.  Otherwise she will follow up after the results.   - pt given knee exercises to do at home to help reduce instances of buckling.   - can put topical medications and ice on knee if this provides relief.

## 2020-10-18 NOTE — Progress Notes (Signed)
[facility] Attending Note: I have seen and examined this patient. I have discussed this patient with the resident and reviewed the assessment and plan as documented above. I agree with the resident's findings and plan. Left hip pain ost consistent with greater trochanteric bursitis. She has antalgic gait with a lot of leftward pelvic shift during stance phase that is likely the cause of her hip pain. Given that she is having TKR of right knee scheduled next week, I would not do CSI now. We did give her handout of hop exercises, but I told her to clear that with her orthopedist before doing them after her TKR. If, after completion TKR, she does not have some improvement in her left hip pain, then I would be happy to do CSI in future.

## 2020-10-18 NOTE — Patient Instructions (Signed)
It was nice to meet you today,  Your pain appears to be due to a meniscal injury to your left knee.  This is what is causing your buckling.  We would like to get an MRI of your knee to further clarify what is going on.  If your insurance does not cover it or you are unable to afford it, please let us know and you can schedule an appointment with [place] to come back in.  Otherwise we will talk with you after we get the results of the MRI.  In the meantime, we have provided you with some exercises you can do at home.  You can apply topical medications over-the-counter such as Voltaren gel or Aspercreme, or lidocaine patches if you feel this helps your pain.  Have a great day,  Frederic Jericho, MD   Meniscus Tear  A meniscus tear is a knee injury that happens when a piece of the meniscus is torn. The meniscus is a thick, rubbery, wedge-shaped cartilage in the knee. Two menisci are located in each knee. They sit between the upper bone (femur) and lower bone (tibia) that make up the knee joint. Each meniscus acts as a shock absorber for the knee. A torn meniscus is one of the most common types of knee injuries. This injury can range from mild to severe. Surgery may be needed to repair a severe tear. What are the causes? This condition may be caused by any kneeling, squatting, twisting, or pivoting movement. Sports-related injuries are the most common cause. These often occur from:  Running and stopping suddenly. ? Changing direction. ? Being tackled or knocked off your feet.  Lifting or carrying heavy weights. As people get older, their menisci get thinner and weaker. In these people, tears can happen more easily, such as from climbing stairs. What increases the risk? You are more likely to develop this condition if you:  Play contact sports.  Have a job that requires kneeling or squatting.  Are female.  Are over 39 years old. What are the signs or symptoms? Symptoms of this condition  include:  Knee pain, especially at the side of the knee joint. You may feel pain when the injury occurs, or you may only hear a pop and feel pain later.  A feeling that your knee is clicking, catching, locking, or giving way.  Not being able to fully bend or extend your knee.  Bruising or swelling in your knee. How is this diagnosed? This condition may be diagnosed based on your symptoms and a physical exam. You may also have tests, such as:  X-rays.  MRI.  A procedure to look inside your knee with a narrow surgical telescope (arthroscopy). You may be referred to a knee specialist (orthopedic surgeon). How is this treated? Treatment for this injury depends on the severity of the tear. Treatment for a mild tear may include:  Rest.  Medicine to reduce pain and swelling. This is usually a nonsteroidal anti-inflammatory drug (NSAID), like ibuprofen.  A knee brace, sleeve, or wrap.  Using crutches or a walker to keep weight off your knee and to help you walk.  Exercises to strengthen your knee (physical therapy). You may need surgery if you have a severe tear or if other treatments are not working. Follow these instructions at home: If you have a brace, sleeve, or wrap:  Wear it as told by your health care provider. Remove it only as told by your health care provider.  Loosen the brace,  sleeve, or wrap if your toes tingle, become numb, or turn cold and blue.  Keep the brace, sleeve, or wrap clean and dry.  If the brace, sleeve, or wrap is not waterproof: ? Do not let it get wet. ? Cover it with a watertight covering when you take a bath or shower. Managing pain and swelling   Take over-the-counter and prescription medicines only as told by your health care provider.  If directed, put ice on your knee: ? If you have a removable brace, sleeve, or wrap, remove it as told by your health care provider. ? Put ice in a plastic bag. ? Place a towel between your skin and the  bag. ? Leave the ice on for 20 minutes, 2-3 times per day.  Move your toes often to avoid stiffness and to lessen swelling.  Raise (elevate) the injured area above the level of your heart while you are sitting or lying down. Activity  Do not use the injured limb to support your body weight until your health care provider says that you can. Use crutches or a walker as told by your health care provider.  Return to your normal activities as told by your health care provider. Ask your health care provider what activities are safe for you.  Perform range-of-motion exercises only as told by your health care provider.  Begin doing exercises to strengthen your knee and leg muscles only as told by your health care provider. After you recover, your health care provider may recommend these exercises to help prevent another injury. General instructions  Use a knee brace, sleeve, or wrap as told by your health care provider.  Ask your health care provider when it is safe to drive if you have a brace, sleeve, or wrap on your knee.  Do not use any products that contain nicotine or tobacco, such as cigarettes, e-cigarettes, and chewing tobacco. If you need help quitting, ask your health care provider.  Ask your health care provider if the medicine prescribed to you: ? Requires you to avoid driving or using heavy machinery. ? Can cause constipation. You may need to take these actions to prevent or treat constipation:  Drink enough fluid to keep your urine pale yellow.  Take over-the-counter or prescription medicines.  Eat foods that are high in fiber, such as beans, whole grains, and fresh fruits and vegetables.  Limit foods that are high in fat and processed sugars, such as fried or sweet foods.  Keep all follow-up visits as told by your health care provider. This is important. Contact a health care provider if:  You have a fever.  Your knee becomes red, tender, or swollen.  Your pain  medicine is not helping.  Your symptoms get worse or do not improve after 2 weeks of home care. Summary  A meniscus tear is a knee injury that happens when a piece of the meniscus is torn.  Treatment for this injury depends on the severity of the tear. You may need surgery if you have a severe tear or if other treatments are not working.  Rest, ice, and raise (elevate) your injured knee as told by your health care provider. This will help lessen pain and swelling.  Contact a health care provider if you have new symptoms, or your symptoms get worse or do not improve after 2 weeks of home care.  Keep all follow-up visits as told by your health care provider. This is important. This information is not intended to replace  advice given to you by your health care provider. Make sure you discuss any questions you have with your health care provider. Document Revised: 05/10/2018 Document Reviewed: 05/10/2018 Elsevier Patient Education  2020 [employer].

## 2020-10-24 ENCOUNTER — Other Ambulatory Visit: Payer: Self-pay

## 2020-10-24 ENCOUNTER — Ambulatory Visit
Admission: RE | Admit: 2020-10-24 | Discharge: 2020-10-24 | Disposition: A | Discharge status: Home / Self Care | Payer: Medicare (Managed Care) | Source: Ambulatory Visit | Attending: Family Medicine | Admitting: Family Medicine

## 2020-10-24 DIAGNOSIS — Z1231 Encounter for screening mammogram for malignant neoplasm of breast: Secondary | ICD-10-CM

## 2020-10-29 NOTE — Progress Notes (Signed)
I spoke with her insurance company and after reviewing all of the clinical information to get her left knee MRI approved, they need you to call to do a peer to peer review. You do not need to schedule this ahead of time, you'll be able to speak to someone when you call.  Call [phone] and provide the case tracking  #[id number]  Her MRI is scheduled for 11/14/20.

## 2020-10-30 NOTE — Progress Notes (Signed)
Megan I called them. They do not have my note for some reason so please fax to [phone]. ALSO, of course they want an X ray so if she wants to go that route, we need to order x ray. Now she was having a total hip done so I am not sure she wants to go out and get a film, but I doubt they willl approve without. If she cannot, I would recommend cancel the MRI until we can get an x ray, get them the note, and get this approved. Tell her I am sorry for the fuss. WHY they want an  X ray of course makes no sense, but they do  THANKS! Denny Levy

## 2020-11-14 ENCOUNTER — Other Ambulatory Visit: Payer: Self-pay

## 2020-11-14 ENCOUNTER — Ambulatory Visit
Admission: RE | Admit: 2020-11-14 | Discharge: 2020-11-14 | Disposition: A | Discharge status: Home / Self Care | Payer: Medicare (Managed Care) | Source: Ambulatory Visit | Attending: Family Medicine | Admitting: Family Medicine

## 2020-11-14 DIAGNOSIS — M25562 Pain in left knee: Secondary | ICD-10-CM

## 2020-11-20 ENCOUNTER — Encounter: Payer: Self-pay | Admitting: Family Medicine

## 2020-11-20 NOTE — Progress Notes (Signed)
Letter sent about mri results as no answer at home [facility] left vm

## 2020-11-22 ENCOUNTER — Telehealth: Payer: Self-pay

## 2020-11-22 NOTE — Telephone Encounter (Signed)
Per Dr. Jennette Kettle -  MRI shows that she has no meniscal issues but she has a lot of cartilage loss which is contributing to her knee pain.   Pt instructed to f/u with [place] if she is continuing to have issues.

## 2020-11-26 ENCOUNTER — Telehealth: Payer: Self-pay | Admitting: Family Medicine

## 2020-11-26 ENCOUNTER — Telehealth: Payer: Self-pay

## 2020-11-26 NOTE — Telephone Encounter (Signed)
Received fax from [facility] requesting a refill of  Ozempic  2MG /1.5ML 0.25/0.5 Pen Inject 0.25 MG into the skin once a week.  Did not see medication on current med list.   , CMA

## 2020-11-26 NOTE — Telephone Encounter (Signed)
Received message that clinic received fax asking for refill of Ozempic.  Per chart review, not on current med list.  It appears that it was last prescribed May 2021.  Of note, hemoglobin A1c on 09/20/19/2021 was 6.9, within goal range.  A1c's on record for the last 2 years all 7.0 or below with the exception of 8.1 on 12/11/2019.   Called patient to ask about refill request.  No answer, left a HIPAA safe voicemail.  Fayette Pho, MD

## 2020-11-26 NOTE — Telephone Encounter (Deleted)
Received message that the clinic received a fax from pharmacy requesting a refill of ozempic.

## 2020-11-26 NOTE — Telephone Encounter (Signed)
Erroneous duplicate telephone encounter.

## 2020-11-28 ENCOUNTER — Telehealth: Payer: Self-pay | Admitting: Family Medicine

## 2020-11-28 DIAGNOSIS — E1169 Type 2 diabetes mellitus with other specified complication: Secondary | ICD-10-CM

## 2020-11-28 DIAGNOSIS — E1140 Type 2 diabetes mellitus with diabetic neuropathy, unspecified: Secondary | ICD-10-CM

## 2020-11-28 MED ORDER — SEMAGLUTIDE(0.25 OR 0.5MG/DOS) 2 MG/1.5ML ~~LOC~~ SOPN: 0.2500 mg | PEN_INJECTOR | SUBCUTANEOUS | 0 refills | Status: DC

## 2020-11-28 NOTE — Telephone Encounter (Signed)
Call the patient regarding her refill request for Ozempic, as this was not on her active med list.  Patient reports she is currently taking this, last fill date and refills congruent.  We will send refill to pharmacy.  Also made an appointment for Thursday 2/10 at 10:45 AM to discuss her diabetes, recent A1c measurements, and possibly discontinuing Ozempic.  Fayette Pho, MD

## 2020-12-03 ENCOUNTER — Other Ambulatory Visit: Payer: Self-pay | Admitting: Family Medicine

## 2020-12-04 ENCOUNTER — Ambulatory Visit (INDEPENDENT_AMBULATORY_CARE_PROVIDER_SITE_OTHER): Payer: Medicare (Managed Care) | Admitting: Family Medicine

## 2020-12-04 ENCOUNTER — Encounter: Payer: Self-pay | Admitting: Family Medicine

## 2020-12-04 ENCOUNTER — Other Ambulatory Visit: Payer: Self-pay

## 2020-12-04 VITALS — BP 142/88 | HR 79 | Ht 64.0 in | Wt 222.0 lb

## 2020-12-04 DIAGNOSIS — L0292 Furuncle, unspecified: Secondary | ICD-10-CM | POA: Diagnosis not present

## 2020-12-04 DIAGNOSIS — Z23 Encounter for immunization: Secondary | ICD-10-CM

## 2020-12-04 MED ORDER — MUPIROCIN CALCIUM 2 % EX CREA: 1.0000 "application " | TOPICAL_CREAM | Freq: Two times a day (BID) | CUTANEOUS | 0 refills | Status: DC

## 2020-12-04 MED ORDER — DOXYCYCLINE HYCLATE 100 MG PO TABS: 100.0000 mg | ORAL_TABLET | Freq: Two times a day (BID) | ORAL | 0 refills | Status: DC

## 2020-12-04 NOTE — Patient Instructions (Signed)
I believe a germ call staph has made camp in your body and is causing these boils.  It can be tough to get rid of.  I suggest a three prong approach. 1. I prescribed an antibiotic pill for you to take 2 times per day for the next ten days. 2. I suggest you get an antibacterial soap such as Dial or Lever 2000 and really lather up every day in the shower for the next two weeks. 3. I prescribed a good antibiotic ointment to use on the boils and in your nose for the next two weeks  I hope doing all those things will get rid of the current boils and prevent new ones from coming.

## 2020-12-05 ENCOUNTER — Encounter: Payer: Self-pay | Admitting: Family Medicine

## 2020-12-05 NOTE — Telephone Encounter (Signed)
Pt has appointment scheduled on 12/19/2020 with PCP.Jlyn Bracamonte Zimmerman Rumple, CMA

## 2020-12-05 NOTE — Progress Notes (Signed)
    SUBJECTIVE:   CHIEF COMPLAINT / HPI:   2-3 week hx of frequent boils.  She has been meticulous with cleanliness for example, frequent baths with epson salts.  Still having boils in axilla, under breasts and vulva. No previous hx of boils.  No fever or systemic sx.    OBJECTIVE:   BP (!) 142/88   Pulse 79   Ht 5\' 4"  (1.626 m)   Wt 222 lb (100.7 kg)   LMP 10/14/2012   SpO2 99%   BMI 38.11 kg/m   Small, moderately painful boils x 2 in right axilla.  Did not exam breast or mons - she says very simlar to axilla.  ASSESSMENT/PLAN:   No problem-specific Assessment & Plan notes found for this encounter.     14/04/2012, MD [facility]

## 2020-12-05 NOTE — Assessment & Plan Note (Signed)
Lack of prior problems makes hydradenitis suppurativa unlikely.  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  Focus will be to treat staph and eradicate colonization.

## 2020-12-10 ENCOUNTER — Other Ambulatory Visit: Payer: Self-pay | Admitting: *Deleted

## 2020-12-10 MED ORDER — DAPAGLIFLOZIN PROPANEDIOL 10 MG PO TABS: 10.0000 mg | ORAL_TABLET | Freq: Every day | ORAL | 3 refills | Status: DC

## 2020-12-18 NOTE — Patient Instructions (Signed)
It was wonderful to see you today. Thank you for allowing me to be a part of your care. Below is a short summary of what we discussed at your visit today:  Diabetes Your A1c today is 6.8, changed from 6.9 three months ago. You are currently doing well on metformin and ozempic.  -Stop Metformin because of abdominal side effects -Increase the Ozempic to 0.5 weekly -Follow-up in 3 months for blood sugar check  Hip Pain -Toradol shot today as short-term solution -Please do the right hip x-rays we can see if there is any osteoarthritis -Follow-up within about a month to talk about possible physical therapy or sports medicine referral  Tramadol refill Today I refilled your tramadol per your request at the end of the visit.  However, I would like to have a more in-depth conversation about this.  Long-term pain management strategies for this that include weaning down off of opioids to help avoid risks of dependence and worsening of pain.  Let's talk at your follow-up visit.   If you have any questions or concerns, please do not hesitate to contact us via phone or MyChart message.   Fayette Pho, MD

## 2020-12-19 ENCOUNTER — Other Ambulatory Visit: Payer: Self-pay

## 2020-12-19 ENCOUNTER — Ambulatory Visit (INDEPENDENT_AMBULATORY_CARE_PROVIDER_SITE_OTHER): Payer: Medicare (Managed Care) | Admitting: Family Medicine

## 2020-12-19 ENCOUNTER — Ambulatory Visit
Admission: RE | Admit: 2020-12-19 | Discharge: 2020-12-19 | Disposition: A | Discharge status: Home / Self Care | Payer: Medicare (Managed Care) | Source: Ambulatory Visit | Attending: Family Medicine | Admitting: Family Medicine

## 2020-12-19 ENCOUNTER — Encounter: Payer: Self-pay | Admitting: Family Medicine

## 2020-12-19 VITALS — BP 140/80 | HR 94 | Ht 64.0 in | Wt 221.6 lb

## 2020-12-19 DIAGNOSIS — K089 Disorder of teeth and supporting structures, unspecified: Secondary | ICD-10-CM

## 2020-12-19 DIAGNOSIS — E1140 Type 2 diabetes mellitus with diabetic neuropathy, unspecified: Secondary | ICD-10-CM | POA: Diagnosis not present

## 2020-12-19 DIAGNOSIS — H43393 Other vitreous opacities, bilateral: Secondary | ICD-10-CM

## 2020-12-19 DIAGNOSIS — H539 Unspecified visual disturbance: Secondary | ICD-10-CM

## 2020-12-19 DIAGNOSIS — M7061 Trochanteric bursitis, right hip: Secondary | ICD-10-CM

## 2020-12-19 DIAGNOSIS — E1169 Type 2 diabetes mellitus with other specified complication: Secondary | ICD-10-CM

## 2020-12-19 DIAGNOSIS — M25551 Pain in right hip: Secondary | ICD-10-CM | POA: Diagnosis not present

## 2020-12-19 DIAGNOSIS — R7989 Other specified abnormal findings of blood chemistry: Secondary | ICD-10-CM | POA: Diagnosis not present

## 2020-12-19 LAB — POCT GLYCOSYLATED HEMOGLOBIN (HGB A1C): Hemoglobin A1C: 6.8 % — AB (ref 4.0–5.6)

## 2020-12-19 MED ORDER — TRAMADOL HCL 50 MG PO TABS: 50.0000 mg | ORAL_TABLET | Freq: Four times a day (QID) | ORAL | 0 refills | Status: DC | PRN

## 2020-12-19 MED ORDER — SEMAGLUTIDE(0.25 OR 0.5MG/DOS) 2 MG/1.5ML ~~LOC~~ SOPN: 0.5000 mg | PEN_INJECTOR | SUBCUTANEOUS | 0 refills | Status: DC

## 2020-12-19 NOTE — Progress Notes (Signed)
SUBJECTIVE:   CHIEF COMPLAINT / HPI:   Diabetes - A1c today 6.8, unchanged from 6.9 three months ago - current meds: metformin, ozempic - doing well with diet, exercise - no episodes of hypoglycemia - vehemently wants to stop metformin because of abdominal irritation - denies nausea, vomiting, diarrhea  Right hip pain - pain with movement and ambulation after periods of rest - sharp, located lateral hip, midline - non-radiating - hoping for toradol shot today to help ease pain  Eye floaters - would like referral to optho for bilateral eye floaters - no vision changes, loss of vision - no headaches - no focal neuro deficits  Dentist - would like referral to dentist today - c/o poor dentition overall, no specific complaints  PERTINENT  PMH / PSH: HTN, intracranial vascular stenosis, T2DM, esophageal dysphagia, HLD, greter trochanteric bursitis of right hip  OBJECTIVE:   BP 140/80   Pulse 94   Ht 5\' 4"  (1.626 m)   Wt 221 lb 9.6 oz (100.5 kg)   LMP 10/14/2012   SpO2 99%   BMI 38.04 kg/m   PHQ-9:  Depression screen [facility] 2/9 12/19/2020 12/04/2020 09/19/2020  Decreased Interest 1 1 2   Down, Depressed, Hopeless 1 - -  PHQ - 2 Score 2 1 2   Altered sleeping 2 1 1   Tired, decreased energy 2 1 1   Change in appetite 1 1 1   Feeling bad or failure about yourself  1 1 1   Trouble concentrating 1 1 1   Moving slowly or fidgety/restless 1 0 1  Suicidal thoughts 0 0 0  PHQ-9 Score 10 6 8   Difficult doing work/chores Somewhat difficult Somewhat difficult Somewhat difficult  Some recent data might be hidden     GAD-7:  GAD 7 : Generalized Anxiety Score 06/27/2020 03/10/2018 12/22/2017  Nervous, Anxious, on Edge 1 3 1   Control/stop worrying 2 2 3   Worry too much - different things 2 1 1   Trouble relaxing 1 1 3   Restless 1 1 1   Easily annoyed or irritable 1 2 1   Afraid - awful might happen 1 3 1   Total GAD 7 Score 9 13 11   Anxiety Difficulty Somewhat difficult Extremely  difficult Not difficult at all     Physical Exam General: Awake, alert, oriented, no acute distress Respiratory: Unlabored respirations, speaking in full sentences, no respiratory distress Extremities: Moving all extremities spontaneously, BLE strength 4/5 and equal bilaterally with hip flexion, knee flexion and extension, foot flexion and extension, exquisite TTP over midline medial aspect of right hip Neuro: Cranial nerves II through X grossly intact Psych: Normal insight and judgement   ASSESSMENT/PLAN:   Type 2 diabetes mellitus with diabetic neuropathy, unspecified (HCC) A1c today 6.8, stable from 3 months ago. Patient strongly desires discontinuation of metformin due to abdominal irritation.  - d/c metformin - increase ozempic from 0.25 to 0.5 weekly - f/u 3 months for A1c check  Greater trochanteric bursitis of right hip Hip pain today likely acute worsening of trochanteric bursitis, but possible development of osteoarthritis.  - toradol shot per request today - weight bearing XR of right hip - OTC topicals and PO pain relievers - f/u one month with possible considerations of sports med, physical therapy, and/or ortho depending on progress and XR results  Vitreous floaters of both eyes Patient reports gradual development of bilateral floaters, is concerned. Requests optho referral. No red flag symptoms.  - optho referral today - UTD on diabetic eye exam  Poor dentition Patient reports  chronic poor dentition and referral to dentistry today. No specific complaints. Explained that she does not need referral to dentistry. Discussed consulting her insurance directory to find dentists.      Fayette Pho, MD [facility]

## 2020-12-20 LAB — BASIC METABOLIC PANEL
BUN/Creatinine Ratio: 16 (ref 9–23)
BUN: 18 mg/dL (ref 6–24)
CO2: 24 mmol/L (ref 20–29)
Calcium: 9.7 mg/dL (ref 8.7–10.2)
Chloride: 100 mmol/L (ref 96–106)
Creatinine, Ser: 1.14 mg/dL — ABNORMAL HIGH (ref 0.57–1.00)
GFR calc Af Amer: 62 mL/min/{1.73_m2} (ref >59)
GFR calc non Af Amer: 54 mL/min/{1.73_m2} — ABNORMAL LOW (ref >59)
Glucose: 94 mg/dL (ref 65–99)
Potassium: 3.8 mmol/L (ref 3.5–5.2)
Sodium: 141 mmol/L (ref 134–144)

## 2020-12-21 DIAGNOSIS — K089 Disorder of teeth and supporting structures, unspecified: Secondary | ICD-10-CM | POA: Insufficient documentation

## 2020-12-21 DIAGNOSIS — H43393 Other vitreous opacities, bilateral: Secondary | ICD-10-CM | POA: Insufficient documentation

## 2020-12-21 NOTE — Assessment & Plan Note (Signed)
Patient reports gradual development of bilateral floaters, is concerned. Requests optho referral. No red flag symptoms.  - optho referral today - UTD on diabetic eye exam

## 2020-12-21 NOTE — Assessment & Plan Note (Signed)
A1c today 6.8, stable from 3 months ago. Patient strongly desires discontinuation of metformin due to abdominal irritation.  - d/c metformin - increase ozempic from 0.25 to 0.5 weekly - f/u 3 months for A1c check

## 2020-12-21 NOTE — Assessment & Plan Note (Signed)
Hip pain today likely acute worsening of trochanteric bursitis, but possible development of osteoarthritis.  - toradol shot per request today - weight bearing XR of right hip - OTC topicals and PO pain relievers - f/u one month with possible considerations of sports med, physical therapy, and/or ortho depending on progress and XR results

## 2020-12-21 NOTE — Assessment & Plan Note (Signed)
Patient reports chronic poor dentition and referral to dentistry today. No specific complaints. Explained that she does not need referral to dentistry. Discussed consulting her insurance directory to find dentists.

## 2020-12-22 ENCOUNTER — Encounter: Payer: Self-pay | Admitting: Family Medicine

## 2020-12-22 DIAGNOSIS — M1611 Unilateral primary osteoarthritis, right hip: Secondary | ICD-10-CM

## 2020-12-22 DIAGNOSIS — M7061 Trochanteric bursitis, right hip: Secondary | ICD-10-CM

## 2020-12-23 MED ORDER — KETOROLAC TROMETHAMINE 30 MG/ML IJ SOLN
30.0000 mg | Freq: Once | INTRAMUSCULAR | Status: AC
  Administered 2020-12-19: 30 mg via INTRAMUSCULAR

## 2020-12-23 NOTE — Addendum Note (Signed)
Addended by: Georges Lynch T on: 12/23/2020 12:49 PM   Modules accepted: Orders

## 2020-12-31 ENCOUNTER — Telehealth: Payer: Self-pay | Admitting: *Deleted

## 2020-12-31 NOTE — Telephone Encounter (Signed)
-----   Message from Fayette Pho, MD sent at 12/23/2020  9:53 PM EST ----- Regarding: Please call and sched f/u [facility],   Could you please call and ask this patient to make a follow up appointment regarding her kidneys? Her creatinine is a little elevated and I want to obtain a urine sample and talk to her about work up. I've sent her a letter with the results as she doesn't have MyChart set up.   Fayette Pho, MD

## 2020-12-31 NOTE — Telephone Encounter (Signed)
Contacted pt and scheduled follow up appointment per Dr. Larita Fife.April Zimmerman Rumple, CMA

## 2021-01-07 ENCOUNTER — Other Ambulatory Visit: Payer: Self-pay | Admitting: Family Medicine

## 2021-01-07 DIAGNOSIS — I10 Essential (primary) hypertension: Secondary | ICD-10-CM

## 2021-01-14 ENCOUNTER — Encounter: Payer: Self-pay | Admitting: Family Medicine

## 2021-01-14 ENCOUNTER — Other Ambulatory Visit: Payer: Self-pay

## 2021-01-14 ENCOUNTER — Ambulatory Visit (INDEPENDENT_AMBULATORY_CARE_PROVIDER_SITE_OTHER): Payer: Medicare (Managed Care) | Admitting: Family Medicine

## 2021-01-14 ENCOUNTER — Other Ambulatory Visit: Payer: Self-pay | Admitting: Family Medicine

## 2021-01-14 VITALS — BP 142/85 | HR 75 | Ht 64.0 in | Wt 217.0 lb

## 2021-01-14 DIAGNOSIS — Z8673 Personal history of transient ischemic attack (TIA), and cerebral infarction without residual deficits: Secondary | ICD-10-CM

## 2021-01-14 DIAGNOSIS — E1140 Type 2 diabetes mellitus with diabetic neuropathy, unspecified: Secondary | ICD-10-CM | POA: Diagnosis not present

## 2021-01-14 DIAGNOSIS — R7989 Other specified abnormal findings of blood chemistry: Secondary | ICD-10-CM | POA: Diagnosis not present

## 2021-01-14 DIAGNOSIS — G3184 Mild cognitive impairment, so stated: Secondary | ICD-10-CM

## 2021-01-14 DIAGNOSIS — K219 Gastro-esophageal reflux disease without esophagitis: Secondary | ICD-10-CM

## 2021-01-14 DIAGNOSIS — F322 Major depressive disorder, single episode, severe without psychotic features: Secondary | ICD-10-CM

## 2021-01-14 DIAGNOSIS — E1169 Type 2 diabetes mellitus with other specified complication: Secondary | ICD-10-CM | POA: Diagnosis not present

## 2021-01-14 DIAGNOSIS — I10 Essential (primary) hypertension: Secondary | ICD-10-CM

## 2021-01-14 DIAGNOSIS — B353 Tinea pedis: Secondary | ICD-10-CM

## 2021-01-14 DIAGNOSIS — Z7902 Long term (current) use of antithrombotics/antiplatelets: Secondary | ICD-10-CM

## 2021-01-14 DIAGNOSIS — I679 Cerebrovascular disease, unspecified: Secondary | ICD-10-CM

## 2021-01-14 DIAGNOSIS — M25562 Pain in left knee: Secondary | ICD-10-CM

## 2021-01-14 MED ORDER — AMLODIPINE BESYLATE 10 MG PO TABS: 10.0000 mg | ORAL_TABLET | Freq: Every day | ORAL | 1 refills | Status: DC

## 2021-01-14 MED ORDER — FAMOTIDINE 20 MG PO TABS: 20.0000 mg | ORAL_TABLET | Freq: Two times a day (BID) | ORAL | 0 refills | Status: DC

## 2021-01-14 MED ORDER — CLOTRIMAZOLE 1 % EX CREA: 1.0000 "application " | TOPICAL_CREAM | Freq: Two times a day (BID) | CUTANEOUS | 1 refills | Status: DC

## 2021-01-14 MED ORDER — SERTRALINE HCL 50 MG PO TABS
50.0000 mg | ORAL_TABLET | Freq: Every day | ORAL | 3 refills | Status: DC
Start: 2021-01-14 — End: 2021-10-16

## 2021-01-14 NOTE — Assessment & Plan Note (Addendum)
Worsened pain of left knee. MR left knee 1/06 demonstrates mild intrasubstance degeneration of the posterior medial meniscus, intact ACL/PCL, and partial thickness cartilage loss within the inferior aspect of the lateral trochlea and central trochlear groove. MRI also showed incidental 8 x 7 mm lobulated T2 hyperintense lesion within the central aspect of the distal femoral metaphysis.  - Start PT 3/10 - Return to sports med for CSI - Continue ice and heat prn

## 2021-01-14 NOTE — Patient Instructions (Signed)
It was wonderful to see you today. Thank you for allowing me to be a part of your care. Below is a short summary of what we discussed at your visit today:  Kidney health - We got several labs today, both urine and blood.  - I will call you with the results no matter if they are normal or abnormal  Knee pain - Start physical therapy - I will have you get back with sports med for possible injection after you have several physical therapy sessions (need to see if that helps first)   Please bring all of your medications to every appointment!  If you have any questions or concerns, please do not hesitate to contact us via phone or MyChart message.   Fayette Pho, MD

## 2021-01-14 NOTE — Assessment & Plan Note (Addendum)
No current meds contributing. No NSAIDs. Longstanding T2DM, although currently well controlled (6.8 in February).  - UA with reflex micro - urine microalbumin send out - urine P:C ratio send out

## 2021-01-14 NOTE — Progress Notes (Signed)
    SUBJECTIVE:   CHIEF COMPLAINT / HPI:   Elevated creatinine follow up Creatinine increased at last check from 1.04 to 1.15. Patient does not take NSAIDs. Here to submit blood and urine samples to work up. Denies urinary issues such as dysuria or polyuria.   Knee pain Continued knee pain. Feels like her knees are going to give out on her. Has fallen multiple times due to this. Has click in left knee with knee extension. Has first physical therapy appointment on 3/10, is not looking forward to it as she is not sure it'll help.  PERTINENT  PMH / PSH: HTN, intracranial vascular stenosis, T2DM, esophageal dysphagia, HLD, greter trochanteric bursitis of right hip  OBJECTIVE:   BP (!) 142/85   Pulse 75   Ht 5\' 4"  (1.626 m)   Wt 217 lb (98.4 kg)   LMP 10/14/2012   SpO2 100%   BMI 37.25 kg/m    PHQ-9:  Depression screen [facility] 2/9 12/19/2020 12/04/2020 09/19/2020  Decreased Interest 1 1 2   Down, Depressed, Hopeless 1 - -  PHQ - 2 Score 2 1 2   Altered sleeping 2 1 1   Tired, decreased energy 2 1 1   Change in appetite 1 1 1   Feeling bad or failure about yourself  1 1 1   Trouble concentrating 1 1 1   Moving slowly or fidgety/restless 1 0 1  Suicidal thoughts 0 0 0  PHQ-9 Score 10 6 8   Difficult doing work/chores Somewhat difficult Somewhat difficult Somewhat difficult  Some recent data might be hidden     GAD-7:  GAD 7 : Generalized Anxiety Score 06/27/2020 03/10/2018 12/22/2017  Nervous, Anxious, on Edge 1 3 1   Control/stop worrying 2 2 3   Worry too much - different things 2 1 1   Trouble relaxing 1 1 3   Restless 1 1 1   Easily annoyed or irritable 1 2 1   Afraid - awful might happen 1 3 1   Total GAD 7 Score 9 13 11   Anxiety Difficulty Somewhat difficult Extremely difficult Not difficult at all     Physical Exam General: Awake, alert, oriented, no acute distress Respiratory: Unlabored respirations, speaking in full sentences, no respiratory distress Extremities: Moving all  extremities spontaneously Neuro: Cranial nerves II through X grossly intact Psych: Normal insight and judgement   ASSESSMENT/PLAN:   Elevated serum creatinine No current meds contributing. No NSAIDs. Longstanding T2DM, although currently well controlled (6.8 in February).  - UA with reflex micro - urine microalbumin send out - urine P:C ratio send out  Left knee pain Worsened pain of left knee. MR left knee 1/06 demonstrates mild intrasubstance degeneration of the posterior medial meniscus, intact ACL/PCL, and partial thickness cartilage loss within the inferior aspect of the lateral trochlea and central trochlear groove. MRI also showed incidental 8 x 7 mm lobulated T2 hyperintense lesion within the central aspect of the distal femoral metaphysis.  - Start PT 3/10 - Return to sports med for CSI - Continue ice and heat prn    , MD [facility]

## 2021-01-15 ENCOUNTER — Telehealth: Payer: Self-pay | Admitting: Family Medicine

## 2021-01-15 LAB — CBC
Hematocrit: 43.8 % (ref 34.0–46.6)
Hemoglobin: 13.8 g/dL (ref 11.1–15.9)
MCH: 25.7 pg — ABNORMAL LOW (ref 26.6–33.0)
MCHC: 31.5 g/dL (ref 31.5–35.7)
MCV: 82 fL (ref 79–97)
Platelets: 297 10*3/uL (ref 150–450)
RBC: 5.36 x10E6/uL — ABNORMAL HIGH (ref 3.77–5.28)
RDW: 14.5 % (ref 11.7–15.4)
WBC: 5.3 10*3/uL (ref 3.4–10.8)

## 2021-01-15 LAB — PROTEIN / CREATININE RATIO, URINE
Creatinine, Urine: 21 mg/dL
Protein, Ur: <4 mg/dL

## 2021-01-15 LAB — BASIC METABOLIC PANEL
BUN/Creatinine Ratio: 14 (ref 9–23)
BUN: 13 mg/dL (ref 6–24)
CO2: 27 mmol/L (ref 20–29)
Calcium: 9.8 mg/dL (ref 8.7–10.2)
Chloride: 102 mmol/L (ref 96–106)
Creatinine, Ser: 0.92 mg/dL (ref 0.57–1.00)
Glucose: 78 mg/dL (ref 65–99)
Potassium: 3.9 mmol/L (ref 3.5–5.2)
Sodium: 144 mmol/L (ref 134–144)
eGFR: 73 mL/min/{1.73_m2} (ref >59)

## 2021-01-15 LAB — URINALYSIS, ROUTINE W REFLEX MICROSCOPIC
Bilirubin, UA: NEGATIVE
Ketones, UA: NEGATIVE
Leukocytes,UA: NEGATIVE
Nitrite, UA: NEGATIVE
Protein,UA: NEGATIVE
RBC, UA: NEGATIVE
Specific Gravity, UA: 1.009 (ref 1.005–1.030)
Urobilinogen, Ur: 0.2 mg/dL (ref 0.2–1.0)
pH, UA: 6 (ref 5.0–7.5)

## 2021-01-15 LAB — MICROALBUMIN / CREATININE URINE RATIO
Microalb/Creat Ratio: <14 mg/g creat (ref 0–29)
Microalbumin, Urine: <3 ug/mL

## 2021-01-15 NOTE — Telephone Encounter (Signed)
Called Kathleen Larson about her BMP and CBC. Relayed that they were both within normal limits. The creatinine (main focus of repeat BMP) was 0.92, the best it has been since 2018. Once the urine send off results come back, I will call her with those too.   Fayette Pho, MD

## 2021-01-16 ENCOUNTER — Ambulatory Visit: Payer: Medicare (Managed Care)

## 2021-01-17 ENCOUNTER — Telehealth: Payer: Self-pay | Admitting: Family Medicine

## 2021-01-17 NOTE — Telephone Encounter (Signed)
Called patient to discuss the remainder of her urine study results - UA, urine P:C ratio, urine microalbumin, and microalbumin:Cr ratio. All look normal. No evidence of kidney dysfunction due to diabetes. Relayed this information to Ms. Abbey. She is happy with these results. No questions.   Fayette Pho, MD

## 2021-01-23 ENCOUNTER — Ambulatory Visit: Payer: Medicare (Managed Care) | Admitting: Physical Therapy

## 2021-03-11 ENCOUNTER — Other Ambulatory Visit: Payer: Self-pay | Admitting: Family Medicine

## 2021-03-11 DIAGNOSIS — E1169 Type 2 diabetes mellitus with other specified complication: Secondary | ICD-10-CM

## 2021-03-11 DIAGNOSIS — E1140 Type 2 diabetes mellitus with diabetic neuropathy, unspecified: Secondary | ICD-10-CM

## 2021-04-07 ENCOUNTER — Other Ambulatory Visit: Payer: Self-pay | Admitting: Family Medicine

## 2021-04-07 DIAGNOSIS — I10 Essential (primary) hypertension: Secondary | ICD-10-CM

## 2021-04-09 ENCOUNTER — Other Ambulatory Visit: Payer: Self-pay | Admitting: Family Medicine

## 2021-04-09 ENCOUNTER — Telehealth: Payer: Self-pay | Admitting: *Deleted

## 2021-04-09 DIAGNOSIS — E1169 Type 2 diabetes mellitus with other specified complication: Secondary | ICD-10-CM

## 2021-04-09 DIAGNOSIS — E1140 Type 2 diabetes mellitus with diabetic neuropathy, unspecified: Secondary | ICD-10-CM

## 2021-04-09 NOTE — Telephone Encounter (Signed)
Needs refill and fax is requesting a 90 day supply. Jaydyn Bozzo Zimmerman Rumple, CMA

## 2021-04-26 ENCOUNTER — Other Ambulatory Visit: Payer: Self-pay | Admitting: Family Medicine

## 2021-05-13 ENCOUNTER — Ambulatory Visit: Payer: Medicare (Managed Care)

## 2021-05-13 NOTE — Progress Notes (Deleted)
    SUBJECTIVE:   CHIEF COMPLAINT / HPI:   ***  PERTINENT  PMH / PSH: HTN, T2DM, depression  OBJECTIVE:   LMP 10/14/2012   General: ***, NAD CV: RRR, no murmurs*** Pulm: CTAB, no wheezes or rales  ASSESSMENT/PLAN:   No problem-specific Assessment & Plan notes found for this encounter.     Littie Deeds, MD [facility]   {    This will disappear when note is signed, click to select method of visit    :1}

## 2021-05-15 ENCOUNTER — Ambulatory Visit (INDEPENDENT_AMBULATORY_CARE_PROVIDER_SITE_OTHER): Payer: Medicare (Managed Care) | Admitting: Family Medicine

## 2021-05-15 ENCOUNTER — Other Ambulatory Visit: Payer: Self-pay

## 2021-05-15 ENCOUNTER — Ambulatory Visit
Admission: RE | Admit: 2021-05-15 | Discharge: 2021-05-15 | Disposition: A | Discharge status: Home / Self Care | Payer: Medicare (Managed Care) | Source: Ambulatory Visit | Attending: Family Medicine | Admitting: Family Medicine

## 2021-05-15 VITALS — BP 118/88 | HR 80 | Ht 64.0 in | Wt 221.0 lb

## 2021-05-15 DIAGNOSIS — M25562 Pain in left knee: Secondary | ICD-10-CM | POA: Diagnosis not present

## 2021-05-15 NOTE — Patient Instructions (Addendum)
It was great seeing you today I am sorry you fell she and hit her left knee.  Just I want to get an x-ray of the knee and I want you to continue taking tramadol as well as insurance will cover Tylenol as needed for pain.  If this does not improve over the next few weeks please return and we can consider an injection or continue to sports medicine for further evaluation.  If you have any questions or concerns please let me know.  I hope you have a wonderful day!

## 2021-05-15 NOTE — Progress Notes (Signed)
    SUBJECTIVE:   CHIEF COMPLAINT / HPI:   Left knee pain Patient has chronic left knee pain but reports that 2 weeks ago she fell and landed on the knee and since then.  Patient reports that she had swelling and is tender to palpation.  She takes tramadol for pain as well as Tylenol.  She reports she wants an injection in that knee if she can today but that she has never had an injection before.  OBJECTIVE:   BP 118/88   Pulse 80   Ht 5\' 4"  (1.626 m)   Wt 221 lb (100.2 kg)   LMP 10/14/2012   SpO2 98%   BMI 37.93 kg/m   General: Well-appearing 55 year old female, no acute distress Cardiac: Regular rate rhythm, no murmurs appreciated Respiratory: Normal work of breathing, lungs clear to auscultation bilaterally  No gross deformity, ecchymoses.  Mild swelling noted Tenderness to palpation over the patella. FROM although difficult to examine because of pain Negative ant/post drawers. Negative valgus/varus testing. Negative lachmans. NV intact distally.   ASSESSMENT/PLAN:   Left knee pain Continued pain of the left knee although patient did fall on it 2 weeks ago.  Physical exam reassuring although tenderness to palpation over the patella.  Patient had an MRI earlier this year which showed intersubstance degeneration of the posterior medial meniscus.  Because of the injury I will get an x-ray of the knee.  Patient may request injection at next visit and if there is no fracture this would be acceptable to try as long as she understands that this is not curative treatment but a method to help calm down the inflammation.  Patient will go and have the x-ray completed.  Strict return precautions given. - Complete x-ray of left knee - Follow-up as needed - Continue tramadol as well as Tylenol for pain - Continue ice and heat as needed     56, MD [facility]

## 2021-05-16 NOTE — Assessment & Plan Note (Signed)
Continued pain of the left knee although patient did fall on it 2 weeks ago.  Physical exam reassuring although tenderness to palpation over the patella.  Patient had an MRI earlier this year which showed intersubstance degeneration of the posterior medial meniscus.  Because of the injury I will get an x-ray of the knee.  Patient may request injection at next visit and if there is no fracture this would be acceptable to try as long as she understands that this is not curative treatment but a method to help calm down the inflammation.  Patient will go and have the x-ray completed.  Strict return precautions given. - Complete x-ray of left knee - Follow-up as needed - Continue tramadol as well as Tylenol for pain - Continue ice and heat as needed

## 2021-06-13 ENCOUNTER — Encounter: Payer: Self-pay | Admitting: Family Medicine

## 2021-06-13 ENCOUNTER — Other Ambulatory Visit: Payer: Self-pay

## 2021-06-13 ENCOUNTER — Ambulatory Visit (INDEPENDENT_AMBULATORY_CARE_PROVIDER_SITE_OTHER): Payer: Medicare (Managed Care) | Admitting: Family Medicine

## 2021-06-13 VITALS — BP 114/78 | HR 81 | Ht 64.0 in | Wt 222.8 lb

## 2021-06-13 DIAGNOSIS — Z23 Encounter for immunization: Secondary | ICD-10-CM

## 2021-06-13 DIAGNOSIS — M25551 Pain in right hip: Secondary | ICD-10-CM | POA: Diagnosis not present

## 2021-06-13 DIAGNOSIS — M25562 Pain in left knee: Secondary | ICD-10-CM | POA: Diagnosis not present

## 2021-06-13 DIAGNOSIS — M7061 Trochanteric bursitis, right hip: Secondary | ICD-10-CM

## 2021-06-13 DIAGNOSIS — E1169 Type 2 diabetes mellitus with other specified complication: Secondary | ICD-10-CM | POA: Diagnosis not present

## 2021-06-13 DIAGNOSIS — K219 Gastro-esophageal reflux disease without esophagitis: Secondary | ICD-10-CM | POA: Diagnosis not present

## 2021-06-13 LAB — POCT GLYCOSYLATED HEMOGLOBIN (HGB A1C): HbA1c, POC (controlled diabetic range): 6.9 % (ref 0.0–7.0)

## 2021-06-13 MED ORDER — METHYLPREDNISOLONE ACETATE 40 MG/ML IJ SUSP
40.0000 mg | Freq: Once | INTRAMUSCULAR | Status: AC
  Administered 2021-06-13: 40 mg via INTRAMUSCULAR

## 2021-06-13 MED ORDER — FAMOTIDINE 20 MG PO TABS
20.0000 mg | ORAL_TABLET | Freq: Two times a day (BID) | ORAL | 0 refills | Status: DC
Start: 2021-06-13 — End: 2021-10-06

## 2021-06-13 MED ORDER — TRAMADOL HCL 50 MG PO TABS: 50.0000 mg | ORAL_TABLET | Freq: Four times a day (QID) | ORAL | 0 refills | Status: DC | PRN

## 2021-06-13 NOTE — Progress Notes (Signed)
    SUBJECTIVE:   CHIEF COMPLAINT / HPI:   Left knee pain - walking fine at baseline - intermittent sharp pain "going on for months" - sometimes when walking up steps causes her pain and trouble - left leg is "good leg", right leg w/ pathology  PERTINENT  PMH / PSH: HTN, T2DM, mixed hearing loss bilaterally, right hip greater trochanteric bursitis, HLD, constipation, MDD  OBJECTIVE:   BP 114/78   Pulse 81   Ht 5\' 4"  (1.626 m)   Wt 222 lb 12.8 oz (101.1 kg)   LMP 10/14/2012   SpO2 98%   BMI 38.24 kg/m   Left Knee Inspection: Normal appearing, no erythema/effusion, no obvious bony abnormality, no obvious Baker's cyst Palpation: No erythema or warmth, some TTP along medial joint line, nonpainful patellar compression, normal patellar glide, patellar and quadriceps tendons unremarkable ROM: Normal flexion of 135 degrees and extension of 0 degrees Strength: 5/5 and equal bilaterally Stability: Ligaments with solid consistent endpoints including ACL/PCL/LCL/MCL Special Tests: Negative anterior drawer, posterior drawer Neurovascular: Intact bilaterally  PROCEDURE: KNEE INJECTION Joint: Left Knee Consent obtained and verified. Wide area cleansed with alcohol Topical analgesic spray: Ethyl chloride. Approached in typical fashion with: antero-medial approach  Completed without difficulty Meds: 1cc DepoMedrol, 4cc Lidocaine 1% Needle: 25 gauge  Aftercare instructions and Red flags advised.  ASSESSMENT/PLAN:   Left knee pain X-ray shows no frank osteoarthritis.  Tramadol and Tylenol providing moderate relief.  Left knee injected with Depo-Medrol and lidocaine during visit.  Continue ice and heat as needed with gentle strengthening exercises of quadriceps and hamstrings.    14/04/2012, MD [facility]

## 2021-06-13 NOTE — Assessment & Plan Note (Signed)
X-ray shows no frank osteoarthritis.  Tramadol and Tylenol providing moderate relief.  Left knee injected with Depo-Medrol and lidocaine during visit.  Continue ice and heat as needed with gentle strengthening exercises of quadriceps and hamstrings.

## 2021-06-13 NOTE — Patient Instructions (Addendum)
It was wonderful to see you today. Thank you for allowing me to be a part of your care. Below is a short summary of what we discussed at your visit today:  Knee injection Today you received an injection with corticosteroid. This injection is usually done in response to pain and inflammation. There is some "numbing" medicine also in the shot so the injected area may be numb and feel really good for the next couple of hours. The numbing medicine usually wears off in 2-3 hours though, and then your pain level will be right back where it was before the injection.   The actually benefit from the steroid injection is usually noticed in 2-7 days. You may actually experience a small (as in 10%) INCREASE in pain in the first 24 hours---that is common.   Things to watch out for that you should contact us or a health care provider urgently would include: 1. Unusual (as in more than 10%) increase in pain 2. New fever > 101.5 3. New swelling or redness of the injected area.  4. Streaking of red lines around the area injected.   Please bring all of your medications to every appointment!  If you have any questions or concerns, please do not hesitate to contact us via phone or MyChart message.   Fayette Pho, MD

## 2021-06-18 ENCOUNTER — Ambulatory Visit: Payer: Medicare (Managed Care)

## 2021-06-20 ENCOUNTER — Other Ambulatory Visit: Payer: Self-pay

## 2021-06-20 ENCOUNTER — Ambulatory Visit (INDEPENDENT_AMBULATORY_CARE_PROVIDER_SITE_OTHER): Payer: Medicare (Managed Care)

## 2021-06-20 DIAGNOSIS — Z23 Encounter for immunization: Secondary | ICD-10-CM | POA: Diagnosis not present

## 2021-07-05 ENCOUNTER — Other Ambulatory Visit: Payer: Self-pay | Admitting: Family Medicine

## 2021-07-05 DIAGNOSIS — I10 Essential (primary) hypertension: Secondary | ICD-10-CM

## 2021-07-17 ENCOUNTER — Other Ambulatory Visit: Payer: Self-pay

## 2021-07-17 DIAGNOSIS — I10 Essential (primary) hypertension: Secondary | ICD-10-CM

## 2021-07-17 MED ORDER — LISINOPRIL-HYDROCHLOROTHIAZIDE 20-12.5 MG PO TABS: 2.0000 | ORAL_TABLET | Freq: Every day | ORAL | 1 refills | Status: DC

## 2021-08-09 ENCOUNTER — Encounter: Payer: Self-pay | Admitting: Nurse Practitioner

## 2021-08-09 ENCOUNTER — Ambulatory Visit: Admit: 2021-08-09 | Payer: Medicare (Managed Care)

## 2021-08-09 ENCOUNTER — Telehealth: Payer: Medicare (Managed Care) | Admitting: Nurse Practitioner

## 2021-08-09 DIAGNOSIS — L509 Urticaria, unspecified: Secondary | ICD-10-CM

## 2021-08-09 MED ORDER — PREDNISONE 20 MG PO TABS: 40.0000 mg | ORAL_TABLET | Freq: Every day | ORAL | 0 refills | Status: AC

## 2021-08-09 NOTE — Progress Notes (Signed)
Virtual Visit Consent   Kathleen Larson, you are scheduled for a virtual visit with Mary-Margaret Daphine Deutscher, FNP, a [facility] provider, today.     Just as with appointments in the office, your consent must be obtained to participate.  Your consent will be active for this visit and any virtual visit you may have with one of our providers in the next 365 days.     If you have a MyChart account, a copy of this consent can be sent to you electronically.  All virtual visits are billed to your insurance company just like a traditional visit in the office.    As this is a virtual visit, video technology does not allow for your provider to perform a traditional examination.  This may limit your provider's ability to fully assess your condition.  If your provider identifies any concerns that need to be evaluated in person or the need to arrange testing (such as labs, EKG, etc.), we will make arrangements to do so.     Although advances in technology are sophisticated, we cannot ensure that it will always work on either your end or our end.  If the connection with a video visit is poor, the visit may have to be switched to a telephone visit.  With either a video or telephone visit, we are not always able to ensure that we have a secure connection.     I need to obtain your verbal consent now.   Are you willing to proceed with your visit today? YES   Kathleen Larson has provided verbal consent on 08/09/2021 for a virtual visit (video or telephone).   Mary-Margaret Daphine Deutscher, FNP   Date: 08/09/2021 1:41 PM   Virtual Visit via Video Note   I, Mary-Margaret Daphine Deutscher, connected with Kathleen Larson ([id number], 04/16/1966) on 08/09/21 at  1:45 PM EDT by a video-enabled telemedicine application and verified that I am speaking with the correct person using two identifiers.  Location: Patient: Virtual Visit Location Patient: Home Provider: Virtual Visit Location Provider: Mobile   I discussed the  limitations of evaluation and management by telemedicine and the availability of in person appointments. The patient expressed understanding and agreed to proceed.    History of Present Illness: Kathleen Larson is a 55 y.o. who identifies as a female who was assigned female at birth, and is being seen today for rash.  HPI: Patient states she went to bed last night with a bump on the right side of face next to eye. This morning she woke up and her right eye swollen shut. She then noticed an itchy rash on bil upper arms. She took some bendaryl which helped some. She denies erythema of eye and there is no drainage.   Problems:  Patient Active Problem List   Diagnosis Date Noted   Elevated serum creatinine 01/14/2021   Vitreous floaters of both eyes 12/21/2020   Poor dentition 12/21/2020   Boils of multiple sites 12/04/2020   Left knee pain 10/18/2020   Knee buckling, left 09/19/2020   Type 2 diabetes mellitus with diabetic neuropathy, unspecified (HCC) 03/15/2020   At risk for falls 03/14/2020   LGSIL on Pap smear of cervix 08/14/2019   Healthcare maintenance 08/14/2019   Yeast dermatitis 03/01/2019   Epiploic appendagitis 09/26/2018   Major depressive disorder, single episode, severe (HCC) 03/10/2018   Esophageal dysphagia 03/10/2018   Mild cognitive impairment 12/27/2017   Lesion of tongue 10/22/2017   Mixed conductive and sensorineural hearing  loss of both ears 10/22/2017   Tinnitus of both ears 06/21/2017   Type 2 diabetes mellitus with other specified complication ([place]) 30/94/0768   Greater trochanteric bursitis of right hip 05/26/2017   Gait disorder 03/25/2017   Neck muscle strain 09/10/2016   Long term current use of antithrombotics/antiplatelets 09/10/2016   Essential hypertension, benign 05/27/2016   Medical non-compliance    Intracranial vascular stenosis    History of stroke    Essential hypertension    Obesity    Abnormal EKG    FATIGUE 01/06/2010   CONSTIPATION  08/81/1031   HELICOBACTER PYLORI INFECTION, HX OF 01/28/2009   ALLERGIC RHINITIS 03/02/2008   Hyperlipemia 07/18/2007    Allergies:  Allergies  Allergen Reactions   Shrimp [Shellfish Allergy] Swelling    Lip swelling   Medications:  Current Outpatient Medications:    amLODipine (NORVASC) 10 MG tablet, Take 1 tablet by mouth once daily, Disp: 90 tablet, Rfl: 0   aspirin 325 MG tablet, Take 1 tablet (325 mg total) by mouth daily., Disp: 30 tablet, Rfl: 8   Blood Glucose Monitoring Suppl (ONETOUCH VERIO) w/Device KIT, Please use to check blood sugars, twice daily. E11.69, Disp: 1 kit, Rfl: 0   clotrimazole (LOTRIMIN) 1 % cream, Apply 1 application topically 2 (two) times daily., Disp: 113 g, Rfl: 1   dapagliflozin propanediol (FARXIGA) 10 MG TABS tablet, Take 1 tablet (10 mg total) by mouth daily., Disp: 90 tablet, Rfl: 3   famotidine (PEPCID) 20 MG tablet, Take 1 tablet (20 mg total) by mouth 2 (two) times daily., Disp: 180 tablet, Rfl: 0   glucose blood (ONETOUCH VERIO) test strip, Please use to check blood sugars, twice daily. E11.69, Disp: 100 each, Rfl: 12   Lancet Devices (ONE TOUCH DELICA LANCING DEV) MISC, Please use to check blood sugars, twice daily. E11.69, Disp: 1 each, Rfl: 1   lisinopril-hydrochlorothiazide (ZESTORETIC) 20-12.5 MG tablet, Take 2 tablets by mouth daily., Disp: 180 tablet, Rfl: 1   mupirocin cream (BACTROBAN) 2 %, Apply 1 application topically 2 (two) times daily. Apply in nose for two weeks, Disp: 30 g, Rfl: 0   nystatin (MYCOSTATIN/NYSTOP) powder, Apply topically 4 (four) times daily. (Patient not taking: Reported on 05/15/2021), Disp: 15 g, Rfl: 0   OneTouch Delica Lancets 59Y MISC, Please use to check blood sugars, twice daily. E11.69, Disp: 1 each, Rfl: 12   rosuvastatin (CRESTOR) 20 MG tablet, Take 1 tablet (20 mg total) by mouth daily., Disp: 90 tablet, Rfl: 3   sertraline (ZOLOFT) 50 MG tablet, Take 1 tablet (50 mg total) by mouth daily., Disp: 90 tablet,  Rfl: 3   traMADol (ULTRAM) 50 MG tablet, Take 1 tablet (50 mg total) by mouth every 6 (six) hours as needed., Disp: 20 tablet, Rfl: 0  Observations/Objective: Patient is well-developed, well-nourished in no acute distress.  Resting comfortably  at home.  Head is normocephalic, atraumatic.  No labored breathing.  Speech is clear and coherent with logical content.  Patient is alert and oriented at baseline.  Righteye swollen shut. No erythema  Maculopapular rash on bil upper arms. Lab Results  Component Value Date   HGBA1C 6.9 06/13/2021    Assessment and Plan:  Kathleen Larson in today with chief complaint of No chief complaint on file.   1. Urticaria Cool compresses Benadryl for itching Prednisone as directed- watch blood sugars while on steroids Meds ordered this encounter  Medications   predniSONE (DELTASONE) 20 MG tablet    Sig: Take 2  tablets (40 mg total) by mouth daily with breakfast for 5 days. 2 po daily for 5 days    Dispense:  10 tablet    Refill:  0    Order Specific Question:   Supervising Provider    Answer:   Noemi Chapel [[id number]]      Follow Up Instructions: I discussed the assessment and treatment plan with the patient. The patient was provided an opportunity to ask questions and all were answered. The patient agreed with the plan and demonstrated an understanding of the instructions.  A copy of instructions were sent to the patient via MyChart.  The patient was advised to call back or seek an in-person evaluation if the symptoms worsen or if the condition fails to improve as anticipated.  Time:  I spent 12 minutes with the patient via telehealth technology discussing the above problems/concerns.    Mary-Margaret Hassell Done, FNP

## 2021-08-29 ENCOUNTER — Other Ambulatory Visit: Payer: Self-pay | Admitting: Family Medicine

## 2021-08-29 DIAGNOSIS — E1140 Type 2 diabetes mellitus with diabetic neuropathy, unspecified: Secondary | ICD-10-CM

## 2021-08-29 DIAGNOSIS — E1169 Type 2 diabetes mellitus with other specified complication: Secondary | ICD-10-CM

## 2021-09-22 ENCOUNTER — Other Ambulatory Visit: Payer: Self-pay | Admitting: Family Medicine

## 2021-09-22 DIAGNOSIS — Z1231 Encounter for screening mammogram for malignant neoplasm of breast: Secondary | ICD-10-CM

## 2021-10-02 ENCOUNTER — Other Ambulatory Visit: Payer: Self-pay | Admitting: Family Medicine

## 2021-10-02 DIAGNOSIS — I10 Essential (primary) hypertension: Secondary | ICD-10-CM

## 2021-10-02 DIAGNOSIS — K219 Gastro-esophageal reflux disease without esophagitis: Secondary | ICD-10-CM

## 2021-10-15 NOTE — Progress Notes (Signed)
SUBJECTIVE:   CHIEF COMPLAINT / HPI:   Back pain - bilateral flank and hip pain - x2 weeks - no UTI symptoms - tylenol OTC for the pain, helping - no trauma, accidents - no pain today  - worse when lying down or attempting to rise from lying position  Cholesterol - Secondary prevention (history of CVA), LDL goal <70  - ASCVD risk calculated to be 16.6% using lipid panel from February 2021 and today's blood pressure - Current medications: Rosuvastatin 20 mg daily - Patient amenable to lipid recheck today, not fasting Lab Results  Component Value Date   CHOL 223 (H) 12/11/2019   HDL 48 12/11/2019   LDLCALC 155 (H) 12/11/2019   LDLDIRECT 64 06/27/2020   TRIG 114 12/11/2019   CHOLHDL 4.6 (H) 12/11/2019   T2DM -Previous lab values below, due for A1c check - Current medications: Farxiga 10 mg daily, rosuvastatin, Ozempic - Patient feels as though Ozempic is not helping her much, she is disappointed that she has gained weight - She is amenable to repeat lab work today and switch to different medication Lab Results  Component Value Date   HGBA1C 6.9 06/13/2021   HGBA1C 6.8 (A) 12/19/2020   HGBA1C 6.9 09/19/2020   Lab Results  Component Value Date   MICROALBUR 0.85 03/22/2009   LDLCALC 155 (H) 12/11/2019   CREATININE 0.92 01/14/2021   Hypertension - At goal today - Current medications: Amlodipine 10, lisinopril-hydrochlorothiazide 20-12.5 mg - Tolerating well, no adverse effects  Hearing difficulties - Previous history of tinnitus, was sent to audiology in 2018 for what was thought to be mixed hearing loss - Patient does not currently have any hearing aids or other treatment - She feels as though she hears just fine, but notes that family members are increasingly asking her if she is understanding what they are saying - She feels like some of the family members are mumbling and that is why she cannot understand them  Grief - Her aunt died 1 month ago, they were  very close - Reports some difficulty now and then with caring for herself due to grief but says that it does not last - Declines grief counseling resources at this time - [place] Spaniel that she has some good support with family, that they get together and talk about it quite a bit  PERTINENT  PMH / PSH:  Patient Active Problem List   Diagnosis Date Noted   Back pain 10/16/2021   Grief 10/16/2021   Elevated serum creatinine 01/14/2021   Vitreous floaters of both eyes 12/21/2020   Poor dentition 12/21/2020   Boils of multiple sites 12/04/2020   Left knee pain 10/18/2020   Knee buckling, left 09/19/2020   Type 2 diabetes mellitus with diabetic neuropathy, unspecified (HCC) 03/15/2020   At risk for falls 03/14/2020   Healthcare maintenance 08/14/2019   Major depressive disorder, single episode, severe (HCC) 03/10/2018   Mixed conductive and sensorineural hearing loss of both ears 10/22/2017   Tinnitus of both ears 06/21/2017   Gait disorder 03/25/2017   Long term current use of antithrombotics/antiplatelets 09/10/2016   Essential hypertension, benign 05/27/2016   Intracranial vascular stenosis    History of stroke    Obesity    FATIGUE 01/06/2010   ALLERGIC RHINITIS 03/02/2008   Hyperlipemia, secondary prevention (LDL <70) 07/18/2007     OBJECTIVE:   BP 130/70   Pulse 92   Ht 5\' 4"  (1.626 m)   Wt 224 lb 9.6 oz (101.9 kg)  LMP 10/14/2012   SpO2 98%   BMI 38.55 kg/m    PHQ-9:  Depression screen [facility] 2/9 06/13/2021 05/15/2021 12/19/2020  Decreased Interest 0 1 1  Down, Depressed, Hopeless 1 1 1   PHQ - 2 Score 1 2 2   Altered sleeping 1 0 2  Tired, decreased energy 0 1 2  Change in appetite 1 0 1  Feeling bad or failure about yourself  1 1 1   Trouble concentrating 0 1 1  Moving slowly or fidgety/restless 1 1 1   Suicidal thoughts 0 0 0  PHQ-9 Score 5 6 10   Difficult doing work/chores Not difficult at all Somewhat difficult Somewhat difficult  Some recent data might be hidden      GAD-7:  GAD 7 : Generalized Anxiety Score 06/27/2020 03/10/2018 12/22/2017  Nervous, Anxious, on Edge 1 3 1   Control/stop worrying 2 2 3   Worry too much - different things 2 1 1   Trouble relaxing 1 1 3   Restless 1 1 1   Easily annoyed or irritable 1 2 1   Afraid - awful might happen 1 3 1   Total GAD 7 Score 9 13 11   Anxiety Difficulty Somewhat difficult Extremely difficult Not difficult at all    Physical Exam General: Awake, alert, oriented HEENT: bilateral TM pearly pink and flat, bilateral external auditory canals with minimal cerumen burden, no lesions Cardiovascular: Regular rate and rhythm, S1 and S2 present, no murmurs auscultated Respiratory: Lung fields clear to auscultation bilaterally  ASSESSMENT/PLAN:   Back pain Subacute, stable.  Suspect muscular in origin.  No red flag symptoms present today.  Discussed conservative management, including heat, ice, OTC lidocaine patches, alternate Tylenol and ibuprofen.  Return precautions discussed, see AVS for more.  Mixed conductive and sensorineural hearing loss of both ears Worsening.  History of tinnitus, previously saw audiology in 2018 (testing report not definitively diagnostic).  No red flag symptoms.  Will send back to audiology for repeat testing, possible hearing aids.  Grief Acute.  Her aunt died about 1 month ago.  They were very close and age and her very close friends.  Declines grief counseling at this time.  Discussed normal grief process and return precautions.  Essential hypertension, benign Improving.  Blood pressure at goal today. Tolerating medications well.  Plan to continue home meds without changes: Amlodipine 10, lisinopril- HCTZ 20-12.5.  Hyperlipemia, secondary prevention (LDL <70) Last lipid panel February 2021.  Patient tolerating rosuvastatin 20 mg well, no adverse reactions.  We will collect blood and recheck lipid panel today.  Will recalculate updated ASCVD risk and adjust statin as indicated.  Type  2 diabetes mellitus with diabetic neuropathy, unspecified (HCC) A1c check today.  Patient prefers collection with venipuncture, so we will follow-up when resulted from the lab.  Current medications: Farxiga 10, rosuvastatin 20, Ozempic 0.5 weekly.  Patient dissatisfied with the Ozempic, would like to try something else.  - Continue Farxiga 10, rosuvastatin 20 - Discontinue Ozempic - Start Mounjaro 2.5 mg weekly x4 weeks, if tolerating will titrate up to 5 mg for month #2 - Recheck A1c in 3 months     05/10/2018, MD [facility]

## 2021-10-16 ENCOUNTER — Ambulatory Visit: Payer: Medicare (Managed Care) | Admitting: Family Medicine

## 2021-10-16 ENCOUNTER — Encounter: Payer: Self-pay | Admitting: Family Medicine

## 2021-10-16 ENCOUNTER — Other Ambulatory Visit: Payer: Self-pay

## 2021-10-16 VITALS — BP 130/70 | HR 92 | Ht 64.0 in | Wt 224.6 lb

## 2021-10-16 DIAGNOSIS — B353 Tinea pedis: Secondary | ICD-10-CM

## 2021-10-16 DIAGNOSIS — E1140 Type 2 diabetes mellitus with diabetic neuropathy, unspecified: Secondary | ICD-10-CM

## 2021-10-16 DIAGNOSIS — H9190 Unspecified hearing loss, unspecified ear: Secondary | ICD-10-CM

## 2021-10-16 DIAGNOSIS — I10 Essential (primary) hypertension: Secondary | ICD-10-CM | POA: Diagnosis not present

## 2021-10-16 DIAGNOSIS — H906 Mixed conductive and sensorineural hearing loss, bilateral: Secondary | ICD-10-CM

## 2021-10-16 DIAGNOSIS — M7061 Trochanteric bursitis, right hip: Secondary | ICD-10-CM

## 2021-10-16 DIAGNOSIS — M549 Dorsalgia, unspecified: Secondary | ICD-10-CM | POA: Insufficient documentation

## 2021-10-16 DIAGNOSIS — M25551 Pain in right hip: Secondary | ICD-10-CM

## 2021-10-16 DIAGNOSIS — F322 Major depressive disorder, single episode, severe without psychotic features: Secondary | ICD-10-CM

## 2021-10-16 DIAGNOSIS — M545 Low back pain, unspecified: Secondary | ICD-10-CM

## 2021-10-16 DIAGNOSIS — E1169 Type 2 diabetes mellitus with other specified complication: Secondary | ICD-10-CM

## 2021-10-16 DIAGNOSIS — F4321 Adjustment disorder with depressed mood: Secondary | ICD-10-CM | POA: Insufficient documentation

## 2021-10-16 DIAGNOSIS — G3184 Mild cognitive impairment, so stated: Secondary | ICD-10-CM

## 2021-10-16 DIAGNOSIS — E785 Hyperlipidemia, unspecified: Secondary | ICD-10-CM

## 2021-10-16 DIAGNOSIS — R7989 Other specified abnormal findings of blood chemistry: Secondary | ICD-10-CM

## 2021-10-16 HISTORY — DX: Dorsalgia, unspecified: M54.9

## 2021-10-16 HISTORY — DX: Adjustment disorder with depressed mood: F43.21

## 2021-10-16 MED ORDER — ROSUVASTATIN CALCIUM 20 MG PO TABS: 20.0000 mg | ORAL_TABLET | Freq: Every day | ORAL | 3 refills | Status: DC

## 2021-10-16 MED ORDER — LISINOPRIL-HYDROCHLOROTHIAZIDE 20-12.5 MG PO TABS: 2.0000 | ORAL_TABLET | Freq: Every day | ORAL | 1 refills | Status: DC

## 2021-10-16 MED ORDER — MOUNJARO 2.5 MG/0.5ML ~~LOC~~ SOAJ: 2.5000 mg | SUBCUTANEOUS | 0 refills | Status: AC

## 2021-10-16 MED ORDER — CLOTRIMAZOLE 1 % EX CREA: 1.0000 "application " | TOPICAL_CREAM | Freq: Two times a day (BID) | CUTANEOUS | 1 refills | Status: DC

## 2021-10-16 MED ORDER — TRAMADOL HCL 50 MG PO TABS: 50.0000 mg | ORAL_TABLET | Freq: Four times a day (QID) | ORAL | 0 refills | Status: DC | PRN

## 2021-10-16 MED ORDER — SERTRALINE HCL 50 MG PO TABS: 50.0000 mg | ORAL_TABLET | Freq: Every day | ORAL | 3 refills | Status: DC

## 2021-10-16 MED ORDER — AMLODIPINE BESYLATE 10 MG PO TABS: 10.0000 mg | ORAL_TABLET | Freq: Every day | ORAL | 1 refills | Status: DC

## 2021-10-16 MED ORDER — DAPAGLIFLOZIN PROPANEDIOL 10 MG PO TABS: 10.0000 mg | ORAL_TABLET | Freq: Every day | ORAL | 1 refills | Status: DC

## 2021-10-16 NOTE — Assessment & Plan Note (Signed)
Last lipid panel February 2021.  Patient tolerating rosuvastatin 20 mg well, no adverse reactions.  We will collect blood and recheck lipid panel today.  Will recalculate updated ASCVD risk and adjust statin as indicated.

## 2021-10-16 NOTE — Assessment & Plan Note (Signed)
A1c check today.  Patient prefers collection with venipuncture, so we will follow-up when resulted from the lab.  Current medications: Farxiga 10, rosuvastatin 20, Ozempic 0.5 weekly.  Patient dissatisfied with the Ozempic, would like to try something else.  - Continue Farxiga 10, rosuvastatin 20 - Discontinue Ozempic - Start Mounjaro 2.5 mg weekly x4 weeks, if tolerating will titrate up to 5 mg for month #2 - Recheck A1c in 3 months

## 2021-10-16 NOTE — Assessment & Plan Note (Signed)
Worsening.  History of tinnitus, previously saw audiology in 2018 (testing report not definitively diagnostic).  No red flag symptoms.  Will send back to audiology for repeat testing, possible hearing aids.

## 2021-10-16 NOTE — Assessment & Plan Note (Signed)
Subacute, stable.  Suspect muscular in origin.  No red flag symptoms present today.  Discussed conservative management, including heat, ice, OTC lidocaine patches, alternate Tylenol and ibuprofen.  Return precautions discussed, see AVS for more.

## 2021-10-16 NOTE — Assessment & Plan Note (Signed)
Acute.  Her aunt died about 1 month ago.  They were very close and age and her very close friends.  Declines grief counseling at this time.  Discussed normal grief process and return precautions.

## 2021-10-16 NOTE — Assessment & Plan Note (Addendum)
Improving.  Blood pressure at goal today. Tolerating medications well.  Plan to continue home meds without changes: Amlodipine 10, lisinopril- HCTZ 20-12.5.

## 2021-10-16 NOTE — Patient Instructions (Signed)
It was wonderful to see you today. Thank you for allowing me to be a part of your care. Below is a short summary of what we discussed at your visit today:  Muscle pain in your back Since I believe this to be muscle tightness related, let us try some conservative measures. - Heating pad - Over-the-counter lidocaine patches - Gentle stretches -You may alternate Tylenol and ibuprofen as needed, taking care to not exceed the maximum recommended dose listed on the bottle   Type 2 diabetes Today we checked your A1c.  It will come back with the other blood work results.  Since you do not believe the Ozempic is working for you, we will stop this and start Mounjaro.  -Use the pen to inject 2.5 mg once a week for 4 weeks -If you tolerates this well, we will then increase the dose to 5 mg once a week -Signs that you are not tolerating this medication include abdominal pain, nausea, and vomiting.  If you experience any of these, please give [place] a call to let us know.  Labs Today we checked her cholesterol, A1c, and blood electrolytes. If the results are normal, I will send you a letter or MyChart message. If the results are abnormal, I will give you a call.    Difficulty hearing I will send a referral to the audiologist to get a hearing test.    If you have any questions or concerns, please do not hesitate to contact us via phone or MyChart message.   Fayette Pho, MD

## 2021-10-17 ENCOUNTER — Telehealth: Payer: Self-pay | Admitting: Family Medicine

## 2021-10-17 DIAGNOSIS — N179 Acute kidney failure, unspecified: Secondary | ICD-10-CM

## 2021-10-17 DIAGNOSIS — E785 Hyperlipidemia, unspecified: Secondary | ICD-10-CM

## 2021-10-17 LAB — BASIC METABOLIC PANEL
BUN/Creatinine Ratio: 13 (ref 9–23)
BUN: 18 mg/dL (ref 6–24)
CO2: 25 mmol/L (ref 20–29)
Calcium: 9.9 mg/dL (ref 8.7–10.2)
Chloride: 102 mmol/L (ref 96–106)
Creatinine, Ser: 1.43 mg/dL — ABNORMAL HIGH (ref 0.57–1.00)
Glucose: 123 mg/dL — ABNORMAL HIGH (ref 70–99)
Potassium: 3.9 mmol/L (ref 3.5–5.2)
Sodium: 143 mmol/L (ref 134–144)
eGFR: 43 mL/min/{1.73_m2} — ABNORMAL LOW (ref >59)

## 2021-10-17 LAB — LIPID PANEL
Chol/HDL Ratio: 4.5 ratio — ABNORMAL HIGH (ref 0.0–4.4)
Cholesterol, Total: 232 mg/dL — ABNORMAL HIGH (ref 100–199)
HDL: 51 mg/dL (ref >39)
LDL Chol Calc (NIH): 163 mg/dL — ABNORMAL HIGH (ref 0–99)
Triglycerides: 103 mg/dL (ref 0–149)
VLDL Cholesterol Cal: 18 mg/dL (ref 5–40)

## 2021-10-17 LAB — HEMOGLOBIN A1C
Est. average glucose Bld gHb Est-mCnc: 151 mg/dL
Hgb A1c MFr Bld: 6.9 % — ABNORMAL HIGH (ref 4.8–5.6)

## 2021-10-17 MED ORDER — ROSUVASTATIN CALCIUM 40 MG PO TABS: 40.0000 mg | ORAL_TABLET | Freq: Every day | ORAL | 2 refills | Status: DC

## 2021-10-17 MED ORDER — BLOOD PRESSURE KIT: PACK | 0 refills | Status: AC

## 2021-10-17 NOTE — Telephone Encounter (Signed)
Called patient to discuss lab results:   #Cholesterol LDL worsened, ASCVD risk 16.6%. Will increase rosuvastatin from 20 mg daily to 40 mg daily with recheck in 4 weeks.   #Diabetes A1c stable at 6.9, patient switching from ozempic to mounjaro.   #AKI BMP shows AKI with creatinine 1.43. Baseline 0.9-1.1. Patient denies NSAID use, dehydration, recent GI illness. Encourage hydration, hold lisinopril-HCTZ for now and recheck with lab only visit Wed-Fri (whenever she can get to lab). Patient to continue amlodipine. Check BP at home (will prescribe cuff) to monitor BP while on reduced BP meds.   All questions answered.   Fayette Pho, MD

## 2021-10-20 ENCOUNTER — Telehealth: Payer: Self-pay

## 2021-10-20 NOTE — Telephone Encounter (Signed)
Medication approved from 10/16/21 until further notice.   Called pharmacy and provided with approval.  Veronda Prude, RN

## 2021-10-20 NOTE — Telephone Encounter (Signed)
Received fax from pharmacy, PA needed on Mounjaro.  Clinical questions submitted via Cover My Meds.  Waiting on response, could take up to 72 hours.  Cover My Meds info: Key:  [id number]  Veronda Prude, RN

## 2021-10-22 NOTE — Telephone Encounter (Signed)
Patients presents to [facility] to discuss medications with a nurse. Patient reports she still has been taking Lisinopril-HCTZ, however per PCP note patient was to hold as of 12/9, hydrate and have labs drawn today-Friday.   Patient advised to hydrate and hold Lisinopril-HCTZ starting today (patient has not had todays dose.)   When would you like to have her back for labs? Will call and schedule her.

## 2021-10-23 ENCOUNTER — Other Ambulatory Visit: Payer: Self-pay

## 2021-10-23 ENCOUNTER — Ambulatory Visit: Payer: Medicare (Managed Care) | Attending: Family Medicine | Admitting: Audiology

## 2021-10-23 DIAGNOSIS — H903 Sensorineural hearing loss, bilateral: Secondary | ICD-10-CM | POA: Diagnosis not present

## 2021-10-23 NOTE — Procedures (Signed)
°  Outpatient Audiology and [facility] [address] [place], [place]  [address] [phone]  AUDIOLOGICAL  EVALUATION  NAME: Kathleen Larson     DOB:   07/09/1966      MRN: [id number]                                                                                     DATE: 10/23/2021     REFERENT: Fayette Pho, MD STATUS: Outpatient DIAGNOSIS: Sensorineural hearing loss, bilateral    History: [name] was seen for an audiological evaluation due to decreased hearing occurring for many years. [name] reports bilateral tinnitus. She denies otalgia, aural fullness, and vertigo. [name] reports increased difficulty communicating with her friends and family members. She was last seen for an audiological evaluation at [facility] on 10/14/2017 at which time results from tympanometry showed normal middle ear function and audiometric results were consistent with a mild hearing loss in the left ear and a mild to moderately-severe sensorineural hearing loss in the right ear. [name] was also seen for an audiological evaluation in 2019 by [facility] at which time results showed asymmetrical hearing loss however the reliability was considered fair to poor. [name] denies concerns regarding her hearing sensitivity.   Evaluation:  Otoscopy showed a clear view of the tympanic membranes, bilaterally Tympanometry results were consistent with normal middle ear pressure and normal tympanic membrane mobility, bilaterally.  Audiometric testing was completed using Conventional Audiometry techniques with insert earphones and TDH headphones. Test results are consistent with a mild to moderate sensorineural hearing loss in both ears. Speech Recognition Thresholds were obtained at 35 dB HL in the right ear and at 30  dB HL in the left ear. Word Recognition Testing was completed at 70 dB HL and [name] scored 88% in the right ear and 84% in the left ear   Results:  The  test results and recommendations were reviewed with [name]. Today's test results are consistent with a mild to moderate sensorineural hearing loss in both ears. [name] will have communication difficulty in many listening environments. She will benefit from the use of good communication strategies, the use of amplification, and the use of hearing assistive technology.   Recommendations: 1.   Communication Needs Assessment with an Audiologist to discuss amplification and hearing assistive technology.  2.   Monitor hearing sensitivity as needed.     If you have any questions please feel free to contact me at [phone].  Marton Redwood Audiologist, Au.D., CCC-A 10/23/2021  11:23 AM  Cc: Fayette Pho, MD

## 2021-10-28 ENCOUNTER — Ambulatory Visit
Admission: RE | Admit: 2021-10-28 | Discharge: 2021-10-28 | Disposition: A | Discharge status: Home / Self Care | Payer: Medicare (Managed Care) | Source: Ambulatory Visit | Attending: *Deleted | Admitting: *Deleted

## 2021-10-28 DIAGNOSIS — Z1231 Encounter for screening mammogram for malignant neoplasm of breast: Secondary | ICD-10-CM

## 2021-10-29 ENCOUNTER — Ambulatory Visit (INDEPENDENT_AMBULATORY_CARE_PROVIDER_SITE_OTHER): Payer: Medicare (Managed Care)

## 2021-10-29 ENCOUNTER — Encounter: Payer: Self-pay | Admitting: Family Medicine

## 2021-10-29 ENCOUNTER — Ambulatory Visit (INDEPENDENT_AMBULATORY_CARE_PROVIDER_SITE_OTHER): Payer: Medicare (Managed Care) | Admitting: Family Medicine

## 2021-10-29 ENCOUNTER — Other Ambulatory Visit: Payer: Self-pay

## 2021-10-29 VITALS — BP 132/94 | HR 74 | Ht 64.0 in | Wt 226.0 lb

## 2021-10-29 DIAGNOSIS — N179 Acute kidney failure, unspecified: Secondary | ICD-10-CM

## 2021-10-29 DIAGNOSIS — I10 Essential (primary) hypertension: Secondary | ICD-10-CM | POA: Diagnosis not present

## 2021-10-29 DIAGNOSIS — Z23 Encounter for immunization: Secondary | ICD-10-CM | POA: Diagnosis not present

## 2021-10-29 NOTE — Assessment & Plan Note (Signed)
Recent AKI, recheck BMP today, future lab order already in place.  Medication adjustment pending results, could consider switch to losartan if creatinine back to baseline.

## 2021-10-29 NOTE — Patient Instructions (Addendum)
It was nice seeing you today!  We will be in touch about your blood work.  Please arrive at least 15 minutes prior to your scheduled appointments.  Stay well, Littie Deeds, MD [facility] [phone]

## 2021-10-29 NOTE — Progress Notes (Signed)
° ° °  SUBJECTIVE:   CHIEF COMPLAINT / HPI: f/u BP  Recently seen by PCP, lisinopril-HCTZ was discontinued due to AKI.  Home readings have been around 130s/90s-100s.  PERTINENT  PMH / PSH: HTN, T2DM  OBJECTIVE:   BP (!) 132/94    Pulse 74    Ht 5\' 4"  (1.626 m)    Wt 226 lb (102.5 kg)    LMP 10/14/2012    SpO2 98%    BMI 38.79 kg/m   General: Alert, NAD CV: RRR, no murmurs Pulm: CTAB, no wheezes or rales  ASSESSMENT/PLAN:   Essential hypertension, benign Recent AKI, recheck BMP today, future lab order already in place.  Medication adjustment pending results, could consider switch to losartan if creatinine back to baseline.   HCM -PCV20 given today  14/04/2012, MD [facility]

## 2021-10-30 ENCOUNTER — Telehealth: Payer: Self-pay | Admitting: Family Medicine

## 2021-10-30 DIAGNOSIS — I10 Essential (primary) hypertension: Secondary | ICD-10-CM

## 2021-10-30 LAB — BASIC METABOLIC PANEL
BUN/Creatinine Ratio: 15 (ref 9–23)
BUN: 16 mg/dL (ref 6–24)
CO2: 24 mmol/L (ref 20–29)
Calcium: 9.8 mg/dL (ref 8.7–10.2)
Chloride: 105 mmol/L (ref 96–106)
Creatinine, Ser: 1.05 mg/dL — ABNORMAL HIGH (ref 0.57–1.00)
Glucose: 96 mg/dL (ref 70–99)
Potassium: 3.9 mmol/L (ref 3.5–5.2)
Sodium: 143 mmol/L (ref 134–144)
eGFR: 62 mL/min/{1.73_m2} (ref >59)

## 2021-10-30 MED ORDER — VALSARTAN 40 MG PO TABS: 40.0000 mg | ORAL_TABLET | Freq: Every day | ORAL | 0 refills | Status: DC

## 2021-10-30 NOTE — Telephone Encounter (Signed)
Patient was seen in office yesterday. Patient has FU labs and BP scheduled in nurse clinic for 11/13/2021.

## 2021-10-30 NOTE — Telephone Encounter (Signed)
Called patient to discuss labs.  AKI resolved. Will switch lisinopril-HCTZ to low dose valsartan.  RN visit for BP check and lab visit scheduled 1/5. BMP future order placed.

## 2021-11-13 ENCOUNTER — Ambulatory Visit: Payer: Medicare (Managed Care)

## 2021-11-13 ENCOUNTER — Other Ambulatory Visit: Payer: Self-pay

## 2021-11-13 ENCOUNTER — Other Ambulatory Visit: Payer: Medicare (Managed Care)

## 2021-11-13 DIAGNOSIS — I10 Essential (primary) hypertension: Secondary | ICD-10-CM

## 2021-11-13 NOTE — Progress Notes (Signed)
Patient here today for BP check.      Last BP was on 10/29/2021 and was 132/94.  BP today is 138/90 with a pulse of 73.    Repeat ~15 minutes later 134/90 with a pulse of 71.  Checked BP in left arm with large cuff.    Symptoms present: None.   Patient last took BP med Valsartan today. Routed note to PCP.      PCP apt scheduled for 12/02/2021 at 2pm.  Patient dropped off at lab for BMP.

## 2021-11-14 LAB — BASIC METABOLIC PANEL
BUN/Creatinine Ratio: 14 (ref 9–23)
BUN: 14 mg/dL (ref 6–24)
CO2: 22 mmol/L (ref 20–29)
Calcium: 9.3 mg/dL (ref 8.7–10.2)
Chloride: 105 mmol/L (ref 96–106)
Creatinine, Ser: 0.98 mg/dL (ref 0.57–1.00)
Glucose: 108 mg/dL — ABNORMAL HIGH (ref 70–99)
Potassium: 4.2 mmol/L (ref 3.5–5.2)
Sodium: 143 mmol/L (ref 134–144)
eGFR: 67 mL/min/{1.73_m2} (ref >59)

## 2021-11-15 ENCOUNTER — Encounter: Payer: Self-pay | Admitting: Family Medicine

## 2021-11-25 ENCOUNTER — Telehealth: Payer: Self-pay | Admitting: *Deleted

## 2021-11-25 NOTE — Telephone Encounter (Signed)
Received fax from [facility] requesting a 90 day supply of Ozempic but did not see on pts current med list. Please send Rx if appropriate.  Kyrstin Campillo Zimmerman Rumple, CMA

## 2021-11-25 NOTE — Telephone Encounter (Signed)
Ozempic was discontinued 10/16/21 due to patient dissatisfaction. We switched her to [facility] instead.   Fayette Pho, MD

## 2021-12-01 NOTE — Progress Notes (Signed)
SUBJECTIVE:   CHIEF COMPLAINT / HPI:   HTN follow up - previously saw PCP 12/08, notes below: Essential hypertension, benign Improving.  Blood pressure at goal today. Tolerating medications well.  Plan to continue home meds without changes: Amlodipine 10, lisinopril-HCTZ 20-12.5 - at BP check 10/29/21 with Dr. Wynelle Link, we found that her AKI resolved and he switched her from lisinopril-HCTZ to valstartan 40 mg daily - patient reports that she does not like the new medication because her legs are swelling and she wants to get back on the old lisinopril-HCTZ - BP today 128/96, then 132/98 - at home readings high too, systolics 140-150s/100s - uses wrist cuff, keeps arm on table level  T2DM - previously saw PCP 12/08, notes below:  Type 2 diabetes mellitus with diabetic neuropathy, unspecified (HCC) A1c check today.  Patient prefers collection with venipuncture, so we will follow-up when resulted from the lab.  Current medications: Farxiga 10, rosuvastatin 20, Ozempic 0.5 weekly.  Patient dissatisfied with the Ozempic, would like to try something else.  - Continue Farxiga 10, rosuvastatin 20 - Discontinue Ozempic - Start Mounjaro 2.5 mg weekly x4 weeks, if tolerating will titrate up to 5 mg for month #2 - Recheck A1c in 3 months - too early for A1c, will be due in early March -Patient only got 1 weeks worth of Mounjaro because she is at the pharmacy was giving her Ozempic instead afterwards  Lab Results  Component Value Date   HGBA1C 6.9 (H) 10/16/2021   HGBA1C 6.9 06/13/2021   HGBA1C 6.8 (A) 12/19/2020   Lab Results  Component Value Date   MICROALBUR 0.85 03/22/2009   LDLCALC 163 (H) 10/16/2021   CREATININE 0.98 11/13/2021    HLD - last labs 9/08, notes below:  LDL worsened, ASCVD risk 16.6%. Will increase rosuvastatin from 20 mg daily to 40 mg daily with recheck in 4 weeks. - patient is tolerating statin at increased dose Lab Results  Component Value Date   CHOL 232 (H)  10/16/2021   HDL 51 10/16/2021   LDLCALC 163 (H) 10/16/2021   LDLDIRECT 64 06/27/2020   TRIG 103 10/16/2021   CHOLHDL 4.5 (H) 10/16/2021    PERTINENT  PMH / PSH:  Patient Active Problem List   Diagnosis Date Noted   Back pain 10/16/2021   Grief 10/16/2021   Elevated serum creatinine 01/14/2021   Vitreous floaters of both eyes 12/21/2020   Poor dentition 12/21/2020   Boils of multiple sites 12/04/2020   Left knee pain 10/18/2020   Knee buckling, left 09/19/2020   Type 2 diabetes mellitus with diabetic neuropathy, unspecified (HCC) 03/15/2020   At risk for falls 03/14/2020   Healthcare maintenance 08/14/2019   Major depressive disorder, single episode, severe (HCC) 03/10/2018   Mixed conductive and sensorineural hearing loss of both ears 10/22/2017   Tinnitus of both ears 06/21/2017   Gait disorder 03/25/2017   Long term current use of antithrombotics/antiplatelets 09/10/2016   Essential hypertension, benign 05/27/2016   Intracranial vascular stenosis    History of stroke    Obesity    FATIGUE 01/06/2010   ALLERGIC RHINITIS 03/02/2008   Hyperlipemia, secondary prevention (LDL <70) 07/18/2007     OBJECTIVE:   BP (!) 132/98    Pulse 72    Ht 5\' 4"  (1.626 m)    Wt 221 lb 12.8 oz (100.6 kg)    LMP 10/14/2012    SpO2 96%    BMI 38.07 kg/m    PHQ-9:  Depression screen  [facility] 2/9 12/02/2021 10/29/2021 06/13/2021  Decreased Interest 0 2 0  Down, Depressed, Hopeless 0 2 1  PHQ - 2 Score 0 4 1  Altered sleeping 2 2 1   Tired, decreased energy 2 0 0  Change in appetite 0 0 1  Feeling bad or failure about yourself  0 0 1  Trouble concentrating 0 2 0  Moving slowly or fidgety/restless 2 2 1   Suicidal thoughts 0 0 0  PHQ-9 Score 6 10 5   Difficult doing work/chores - - Not difficult at all  Some recent data might be hidden     GAD-7:  GAD 7 : Generalized Anxiety Score 06/27/2020 03/10/2018 12/22/2017  Nervous, Anxious, on Edge 1 3 1   Control/stop worrying 2 2 3   Worry too much -  different things 2 1 1   Trouble relaxing 1 1 3   Restless 1 1 1   Easily annoyed or irritable 1 2 1   Afraid - awful might happen 1 3 1   Total GAD 7 Score 9 13 11   Anxiety Difficulty Somewhat difficult Extremely difficult Not difficult at all     Physical Exam General: Awake, alert, oriented Cardiovascular: Regular rate and rhythm, S1 and S2 present, no murmurs auscultated Respiratory: Lung fields clear to auscultation bilaterally Extremities: Trace bilateral lower extremity edema, palpable pedal and pretibial pulses bilaterally  ASSESSMENT/PLAN:   Essential hypertension, benign Chronic, elevated.  Diastolic hypertension.  For the last month, has been on amlodipine 10, valsartan 40 mg due to creatinine bump during which she was taken off of lisinopril-HCTZ and placed onto valsartan.  Home BP higher than normal, patient notices slight leg swelling and requests restart lisinopril-HCTZ. - Continue amlodipine 10 - Discontinue valsartan 40 - Start lisinopril-HCTZ 20-2.5 mg - BMP in 2 weeks to recheck creatinine  Type 2 diabetes mellitus with diabetic neuropathy, unspecified (HCC) Chronic, stable.  Previously discontinued Ozempic and started [facility]; however patient reports that she only got 1 weeks worth of Mounjaro and afterwards the pharmacy substituted Ozempic.  We will send a new prescription of Mounjaro.  Next due for A1c early March.  Hyperlipemia, secondary prevention (LDL <70) Chronic.  At last appointment, was increased from rosuvastatin 20 to 40 mg daily.  We will collect direct LDL today to assess efficacy.     12/24/2017, MD [facility]

## 2021-12-02 ENCOUNTER — Other Ambulatory Visit: Payer: Self-pay

## 2021-12-02 ENCOUNTER — Ambulatory Visit: Payer: Medicare (Managed Care) | Admitting: Family Medicine

## 2021-12-02 ENCOUNTER — Encounter: Payer: Self-pay | Admitting: Family Medicine

## 2021-12-02 VITALS — BP 132/98 | HR 72 | Ht 64.0 in | Wt 221.8 lb

## 2021-12-02 DIAGNOSIS — E785 Hyperlipidemia, unspecified: Secondary | ICD-10-CM

## 2021-12-02 DIAGNOSIS — E1140 Type 2 diabetes mellitus with diabetic neuropathy, unspecified: Secondary | ICD-10-CM

## 2021-12-02 DIAGNOSIS — I10 Essential (primary) hypertension: Secondary | ICD-10-CM

## 2021-12-02 MED ORDER — MOUNJARO 2.5 MG/0.5ML ~~LOC~~ SOAJ: 2.5000 mg | SUBCUTANEOUS | 0 refills | Status: DC

## 2021-12-02 MED ORDER — LISINOPRIL-HYDROCHLOROTHIAZIDE 20-12.5 MG PO TABS: 1.0000 | ORAL_TABLET | Freq: Every day | ORAL | 2 refills | Status: DC

## 2021-12-02 NOTE — Assessment & Plan Note (Signed)
Chronic.  At last appointment, was increased from rosuvastatin 20 to 40 mg daily.  We will collect direct LDL today to assess efficacy.

## 2021-12-02 NOTE — Assessment & Plan Note (Signed)
Chronic, stable.  Previously discontinued Ozempic and started [facility]; however patient reports that she only got 1 weeks worth of Mounjaro and afterwards the pharmacy substituted Ozempic.  We will send a new prescription of Mounjaro.  Next due for A1c early March.

## 2021-12-02 NOTE — Patient Instructions (Signed)
It was wonderful to see you today. Thank you for allowing me to be a part of your care. Below is a short summary of what we discussed at your visit today:  Blood pressure - Stop valsartan - Continue amlodipine - Restart lisinopril-HCTZ 20-12.5 mg, I have sent the prescription to pharmacy it should be ready for pickup soon. -Come back for a lab only visit in 2 weeks to check on your kidneys  Diabetes -Start the [facility] 2.5 mg once weekly -Next A1c in late March  Cholesterol - Continue taking the rosuvastatin 40 mg daily - Today I collected a cholesterol lab to see how you are doing on this higher dose of your cholesterol medicine. If the results are normal, I will send you a letter or MyChart message. If the results are abnormal, I will give you a call.     Please bring all of your medications to every appointment!  If you have any questions or concerns, please do not hesitate to contact us via phone or MyChart message.   Fayette Pho, MD

## 2021-12-02 NOTE — Assessment & Plan Note (Signed)
Chronic, elevated.  Diastolic hypertension.  For the last month, has been on amlodipine 10, valsartan 40 mg due to creatinine bump during which she was taken off of lisinopril-HCTZ and placed onto valsartan.  Home BP higher than normal, patient notices slight leg swelling and requests restart lisinopril-HCTZ. - Continue amlodipine 10 - Discontinue valsartan 40 - Start lisinopril-HCTZ 20-2.5 mg - BMP in 2 weeks to recheck creatinine

## 2021-12-03 ENCOUNTER — Telehealth: Payer: Self-pay | Admitting: Family Medicine

## 2021-12-03 DIAGNOSIS — E785 Hyperlipidemia, unspecified: Secondary | ICD-10-CM

## 2021-12-03 LAB — LDL CHOLESTEROL, DIRECT: LDL Direct: 82 mg/dL (ref 0–99)

## 2021-12-03 NOTE — Telephone Encounter (Signed)
Called patient regarding yesterday's LDL result. No answer, left HIPAA safe VM. Given goal for secondary prevention of LDL <70, would like to lower her LDL a little more. She is currently on rosuvastatin 40 mg daily. If she is adherent to this medication daily, we could either do some dietary changes, increase the rosuvastatin to 80 mg, or add on zetia. Whatever she chooses, we'll need a repeat LDL in about 3 months to make sure we're reaching our goal.  Ezequiel Essex, MD

## 2021-12-16 ENCOUNTER — Other Ambulatory Visit: Payer: Medicare (Managed Care)

## 2021-12-17 ENCOUNTER — Other Ambulatory Visit: Payer: Self-pay

## 2021-12-17 ENCOUNTER — Other Ambulatory Visit: Payer: Medicare (Managed Care)

## 2021-12-17 DIAGNOSIS — E785 Hyperlipidemia, unspecified: Secondary | ICD-10-CM

## 2021-12-17 DIAGNOSIS — I10 Essential (primary) hypertension: Secondary | ICD-10-CM

## 2021-12-18 LAB — LDL CHOLESTEROL, DIRECT

## 2021-12-19 ENCOUNTER — Telehealth: Payer: Self-pay | Admitting: Family Medicine

## 2021-12-19 NOTE — Telephone Encounter (Signed)
Called patient regarding lab results. We have been adjusting her BP meds due to a bump in creatinine.    At visit on 10/16/2021, BP was at goal with medications amlodipine 10, lisinopril-HCTZ 20-12.5 mg.  Was tolerating well, no side effects.  CMP checked at that time showed creatinine 1.43, up from baseline of 0.88-1.04.  She was instructed to hold lisinopril HCTZ and come back for lab recheck.  At recheck with Dr. Nancy Fetter 12/21, patient had diastolic hypertension in clinic and at home.  Home readings 130s/90s-100s.  Creatinine at that visit was 1.05, patient was instructed to continue holding lisinopril HCTZ.  Dr. Nancy Fetter started her on valsartan instead.   On 1/5, presented for RN BP check and lab visit.  RN noted similar readings as before with diastolic hypertension of [id number].  BMP checked and creatinine was 0.98, back to baseline.  During visit with myself on 1/24, patient noted that her blood pressure was not well controlled at home (140-150s/100s) and that her leg swelling had returned.  Patient desired to restart her old lisinopril-HCTZ combo medication.  Given normal creatinine, we went ahead and restarted with a recheck 2 weeks later.  Unfortunately the recheck on 2/8 demonstrated creatinine bumped back to 1.25.  I did call the patient and asked her to hold lisinopril HCTZ combo pill.   -Rx HCTZ, 12.5 mg  -Continue amlodipine 10 mg -Appointment scheduled for 2/28 to come back and recheck creatinine, blood pressure  Ezequiel Essex, MD

## 2021-12-20 LAB — BASIC METABOLIC PANEL
BUN/Creatinine Ratio: 17 (ref 9–23)
BUN: 21 mg/dL (ref 6–24)
CO2: 22 mmol/L (ref 20–29)
Calcium: 10.3 mg/dL — ABNORMAL HIGH (ref 8.7–10.2)
Chloride: 100 mmol/L (ref 96–106)
Creatinine, Ser: 1.25 mg/dL — ABNORMAL HIGH (ref 0.57–1.00)
Glucose: 131 mg/dL — ABNORMAL HIGH (ref 70–99)
Potassium: 4.1 mmol/L (ref 3.5–5.2)
Sodium: 145 mmol/L — ABNORMAL HIGH (ref 134–144)
eGFR: 50 mL/min/{1.73_m2} — ABNORMAL LOW (ref >59)

## 2021-12-20 LAB — LDL CHOLESTEROL, DIRECT: LDL Direct: 110 mg/dL — ABNORMAL HIGH (ref 0–99)

## 2021-12-26 ENCOUNTER — Telehealth: Payer: Self-pay | Admitting: *Deleted

## 2021-12-26 DIAGNOSIS — I10 Essential (primary) hypertension: Secondary | ICD-10-CM

## 2021-12-26 NOTE — Telephone Encounter (Signed)
Received a fax from pharmacy requesting a 90 day supply to be sent in for the pts lisinop/hctz. Dharma Pare Zimmerman Rumple, CMA

## 2021-12-27 MED ORDER — LISINOPRIL-HYDROCHLOROTHIAZIDE 20-12.5 MG PO TABS: 1.0000 | ORAL_TABLET | Freq: Every day | ORAL | 3 refills | Status: DC

## 2021-12-27 NOTE — Telephone Encounter (Signed)
90 day fill sent as requested.  Ezequiel Essex, MD

## 2021-12-31 ENCOUNTER — Other Ambulatory Visit: Payer: Self-pay | Admitting: Family Medicine

## 2021-12-31 DIAGNOSIS — K219 Gastro-esophageal reflux disease without esophagitis: Secondary | ICD-10-CM

## 2022-01-06 ENCOUNTER — Ambulatory Visit: Payer: Medicare (Managed Care) | Admitting: Family Medicine

## 2022-01-08 ENCOUNTER — Other Ambulatory Visit: Payer: Self-pay

## 2022-01-08 ENCOUNTER — Ambulatory Visit: Payer: Medicare Other | Admitting: Family Medicine

## 2022-01-08 ENCOUNTER — Encounter: Payer: Self-pay | Admitting: Family Medicine

## 2022-01-08 VITALS — BP 132/84 | HR 77 | Ht 64.0 in | Wt 223.0 lb

## 2022-01-08 DIAGNOSIS — E785 Hyperlipidemia, unspecified: Secondary | ICD-10-CM

## 2022-01-08 DIAGNOSIS — I10 Essential (primary) hypertension: Secondary | ICD-10-CM | POA: Diagnosis not present

## 2022-01-08 DIAGNOSIS — E1140 Type 2 diabetes mellitus with diabetic neuropathy, unspecified: Secondary | ICD-10-CM | POA: Diagnosis not present

## 2022-01-08 LAB — POCT GLYCOSYLATED HEMOGLOBIN (HGB A1C): HbA1c, POC (controlled diabetic range): 6.6 % (ref 0.0–7.0)

## 2022-01-08 MED ORDER — MOUNJARO 2.5 MG/0.5ML ~~LOC~~ SOAJ: 2.5000 mg | SUBCUTANEOUS | 0 refills | Status: DC

## 2022-01-08 NOTE — Progress Notes (Signed)
? ? ?SUBJECTIVE:  ? ?CHIEF COMPLAINT / HPI:  ? ?BP follow up ?Previously was on amlodipine 10 and lisinopril-HCTZ 20-12.5 mg and stable.  Unfortunately, CMP 10/16/2021 showed creatinine bump to 1.43, baseline thought to be 0.88-1.04.  Has returned to clinic several times for med adjustment and creatinine recheck.   ? ?Patient initially unable to manage hypertension with amlodipine alone, so valsartan was started on 12/21.  At follow-up 1/24, she had improved blood pressures and creatinine but reported increasing leg swelling since being off HCTZ and requested to restart lisinopril-HCTZ and stop valsartan.   ? ?Recheck on 2/8 showed repeat creatinine bump.  At that time, she was told to take amlodipine 10 mg and HCTZ 5 mg.   ? ?Today, patient presents for a recheck.  Her blood pressure today is close to goal.  She reports she never actually received the HCTZ, so has been taking amlodipine 10 mg only.  She has no complaints today: no neurological deficits, headache, vision change, weakness, leg swelling.  She reports blood pressures at home are max 135 systolic and max 98 diastolic. ? ?BP Readings from Last 3 Encounters:  ?01/08/22 132/84  ?12/02/21 (!) 132/98  ?11/13/21 134/90  ?  ?T2DM ?- A1c today 6.6, slightly improved from last check 3 months ago ?- Currently on Farxiga 10 mg ?- Is prescribed Mounjaro weekly, but reports that the pharmacy keeps giving her Ozempic so she has not yet started [facility] ? ?Lab Results  ?Component Value Date  ? HGBA1C 6.6 01/08/2022  ? HGBA1C 6.9 (H) 10/16/2021  ? HGBA1C 6.9 06/13/2021  ? ?Lab Results  ?Component Value Date  ? MICROALBUR 0.85 03/22/2009  ? LDLCALC 163 (H) 10/16/2021  ? CREATININE 1.25 (H) 12/17/2021  ? ?PERTINENT  PMH / PSH:  ?Patient Active Problem List  ? Diagnosis Date Noted  ? Back pain 10/16/2021  ? Grief 10/16/2021  ? Elevated serum creatinine 01/14/2021  ? Vitreous floaters of both eyes 12/21/2020  ? Poor dentition 12/21/2020  ? Boils of multiple sites  12/04/2020  ? Left knee pain 10/18/2020  ? Knee buckling, left 09/19/2020  ? Type 2 diabetes mellitus with diabetic neuropathy, unspecified (HCC) 03/15/2020  ? At risk for falls 03/14/2020  ? Healthcare maintenance 08/14/2019  ? Major depressive disorder, single episode, severe (HCC) 03/10/2018  ? Mixed conductive and sensorineural hearing loss of both ears 10/22/2017  ? Tinnitus of both ears 06/21/2017  ? Gait disorder 03/25/2017  ? Long term current use of antithrombotics/antiplatelets 09/10/2016  ? Essential hypertension, benign 05/27/2016  ? Intracranial vascular stenosis   ? History of stroke   ? Obesity   ? FATIGUE 01/06/2010  ? ALLERGIC RHINITIS 03/02/2008  ? Hyperlipemia, secondary prevention (LDL <70) 07/18/2007  ?  ?OBJECTIVE:  ? ?BP 132/84   Pulse 77   Ht 5\' 4"  (1.626 m)   Wt 223 lb (101.2 kg)   LMP 10/14/2012   SpO2 95%   BMI 38.28 kg/m?   ? ?PHQ-9:  ?Depression screen [facility] 2/9 01/08/2022 12/02/2021 10/29/2021  ?Decreased Interest 0 0 2  ?Down, Depressed, Hopeless 0 0 2  ?PHQ - 2 Score 0 0 4  ?Altered sleeping 0 2 2  ?Tired, decreased energy 0 2 0  ?Change in appetite 0 0 0  ?Feeling bad or failure about yourself  0 0 0  ?Trouble concentrating 0 0 2  ?Moving slowly or fidgety/restless 0 2 2  ?Suicidal thoughts 0 0 0  ?PHQ-9 Score 0 6 10  ?Difficult doing  work/chores - - -  ?Some recent data might be hidden  ?  ?GAD-7:  ?GAD 7 : Generalized Anxiety Score 06/27/2020 03/10/2018 12/22/2017  ?Nervous, Anxious, on Edge 1 3 1   ?Control/stop worrying 2 2 3   ?Worry too much - different things 2 1 1   ?Trouble relaxing 1 1 3   ?Restless 1 1 1   ?Easily annoyed or irritable 1 2 1   ?Afraid - awful might happen 1 3 1   ?Total GAD 7 Score 9 13 11   ?Anxiety Difficulty Somewhat difficult Extremely difficult Not difficult at all  ? ?Physical Exam ?General: Awake, alert, oriented ?Cardiovascular: Regular rate and rhythm, S1 and S2 present, no murmurs auscultated ?Respiratory: Lung fields clear to auscultation  bilaterally ?Neuro: Cranial nerves II through X grossly intact, able to move all extremities spontaneously ? ?ASSESSMENT/PLAN:  ? ?Essential hypertension, benign ?BP at goal today on amlodipine 10 mg only.  Unfortunately did not receive HCTZ when it was prescribed on 2/8, so has been taking amlodipine slowly.  No red flag symptoms today, no endorgan dysfunction.  As she is controlled today, I will simply keep her on the amlodipine 10 mg and recheck a BMP. ? ?Type 2 diabetes mellitus with diabetic neuropathy, unspecified (HCC) ?Improved.  A1c today 6.6.  Only current diabetes medication is .  She is prescribed Mounjaro, but reports she has not yet started this because the pharmacy keeps giving her Ozempic instead despite explicit instructions on the prescription do not substitute Ozempic.  Today I provided her with a printed prescription for [facility].  Patient to call with problems at the pharmacy.  Refer to ophthalmology for annual eye exam. ?  ? ? ? , MD ?[facility]  ?

## 2022-01-08 NOTE — Assessment & Plan Note (Signed)
BP at goal today on amlodipine 10 mg only.  Unfortunately did not receive HCTZ when it was prescribed on 2/8, so has been taking amlodipine slowly.  No red flag symptoms today, no endorgan dysfunction.  As she is controlled today, I will simply keep her on the amlodipine 10 mg and recheck a BMP. ?

## 2022-01-08 NOTE — Assessment & Plan Note (Addendum)
Improved.  A1c today 6.6.  Only current diabetes medication is [place].  She is prescribed Mounjaro, but reports she has not yet started this because the pharmacy keeps giving her Ozempic instead despite explicit instructions on the prescription do not substitute Ozempic.  Today I provided her with a printed prescription for [facility].  Patient to call with problems at the pharmacy.  Refer to ophthalmology for annual eye exam. ?

## 2022-01-08 NOTE — Patient Instructions (Signed)
It was wonderful to see you today. Thank you for allowing me to be a part of your care. Below is a short summary of what we discussed at your visit today: ? ?Blood pressure ?Your blood pressure today is great!  ?We will check one more blood sample for your kidneys. If the results are normal, I will send you a letter or MyChart message. If the results are abnormal, I will give you a call.   ? ?Eye exam ?You are due for your diabetic eye exam. You should have one every year. I have sent the referral to the eye doctor. They should be calling you soon.  ? ?Shingles vaccine ?You are eligible to get the shingles vaccine. You may get it from your favorite pharmacy. It will help to prevent a painful shingles rash.  ? ? ?Please bring all of your medications to every appointment! ? ?If you have any questions or concerns, please do not hesitate to contact us via phone or MyChart message.  ? ?Ezequiel Essex, MD  ?

## 2022-01-09 LAB — BASIC METABOLIC PANEL
BUN/Creatinine Ratio: 11 (ref 9–23)
BUN: 12 mg/dL (ref 6–24)
CO2: 24 mmol/L (ref 20–29)
Calcium: 9.9 mg/dL (ref 8.7–10.2)
Chloride: 105 mmol/L (ref 96–106)
Creatinine, Ser: 1.13 mg/dL — ABNORMAL HIGH (ref 0.57–1.00)
Glucose: 91 mg/dL (ref 70–99)
Potassium: 4.4 mmol/L (ref 3.5–5.2)
Sodium: 143 mmol/L (ref 134–144)
eGFR: 57 mL/min/{1.73_m2} — ABNORMAL LOW (ref >59)

## 2022-01-27 ENCOUNTER — Other Ambulatory Visit: Payer: Self-pay | Admitting: Family Medicine

## 2022-01-27 DIAGNOSIS — E785 Hyperlipidemia, unspecified: Secondary | ICD-10-CM

## 2022-01-29 LAB — HM DIABETES EYE EXAM

## 2022-02-09 ENCOUNTER — Other Ambulatory Visit: Payer: Self-pay | Admitting: Family Medicine

## 2022-02-09 DIAGNOSIS — E1140 Type 2 diabetes mellitus with diabetic neuropathy, unspecified: Secondary | ICD-10-CM

## 2022-02-09 DIAGNOSIS — E785 Hyperlipidemia, unspecified: Secondary | ICD-10-CM

## 2022-03-24 ENCOUNTER — Ambulatory Visit (INDEPENDENT_AMBULATORY_CARE_PROVIDER_SITE_OTHER): Payer: Medicare Other | Admitting: Family Medicine

## 2022-03-24 ENCOUNTER — Encounter: Payer: Self-pay | Admitting: Family Medicine

## 2022-03-24 VITALS — BP 145/104 | HR 84 | Ht 64.0 in | Wt 228.0 lb

## 2022-03-24 DIAGNOSIS — I10 Essential (primary) hypertension: Secondary | ICD-10-CM | POA: Diagnosis not present

## 2022-03-24 DIAGNOSIS — E1140 Type 2 diabetes mellitus with diabetic neuropathy, unspecified: Secondary | ICD-10-CM

## 2022-03-24 DIAGNOSIS — E785 Hyperlipidemia, unspecified: Secondary | ICD-10-CM | POA: Diagnosis not present

## 2022-03-24 MED ORDER — HYDROCHLOROTHIAZIDE 25 MG PO TABS: 12.5000 mg | ORAL_TABLET | Freq: Every day | ORAL | 3 refills | Status: DC

## 2022-03-24 MED ORDER — ROSUVASTATIN CALCIUM 40 MG PO TABS: 80.0000 mg | ORAL_TABLET | Freq: Every day | ORAL | 3 refills | Status: DC

## 2022-03-24 MED ORDER — ATORVASTATIN CALCIUM 80 MG PO TABS: 80.0000 mg | ORAL_TABLET | Freq: Every day | ORAL | 3 refills | Status: DC

## 2022-03-24 NOTE — Progress Notes (Signed)
? ? ?  SUBJECTIVE:  ? ?CHIEF COMPLAINT / HPI:  ? ?Hypertension ?Was at goal at her last appointment, was left on amlodipine 10 mg alone.  We have previously discussed adding HCTZ, but because her blood pressure was at goal we declined adding this on.  Since this time, she reports increased foot swelling that is equal bilaterally.  No redness or tenderness. ? ?BP Readings from Last 3 Encounters:  ?03/24/22 (!) 145/104  ?01/08/22 132/84  ?12/02/21 (!) 132/98  ?  ?Hx stroke, HLD secondary prevention ?Currently on rosuvastatin 40 mg daily, tolerating well.  She reports intermittent muscle cramps in her thighs but these are few and far between.  She does not believe these have anything to do with the statin, as dose increases in the past have not affected these cramps. ?Lab Results  ?Component Value Date  ? CHOL 232 (H) 10/16/2021  ? HDL 51 10/16/2021  ? LDLCALC 163 (H) 10/16/2021  ? LDLDIRECT 110 (H) 12/17/2021  ? TRIG 103 10/16/2021  ? CHOLHDL 4.5 (H) 10/16/2021  ? ?T2DM ?Taking mounjaro, tolerating well ?Taking Farxiga, tolerating well, no UTI symptoms  ?Previous A1c is below, last March 2 ?Lab Results  ?Component Value Date  ? HGBA1C 6.6 01/08/2022  ? HGBA1C 6.9 (H) 10/16/2021  ? HGBA1C 6.9 06/13/2021  ? ?Lab Results  ?Component Value Date  ? MICROALBUR 0.85 03/22/2009  ? LDLCALC 163 (H) 10/16/2021  ? CREATININE 1.13 (H) 01/08/2022  ? ? ?PERTINENT  PMH / PSH: HTN, T2DM, HLD, history of stroke, intracranial vascular stenosis ? ?OBJECTIVE:  ? ?BP (!) 145/104   Pulse 84   Ht 5\' 4"  (1.626 m)   Wt 228 lb (103.4 kg)   LMP 10/14/2012   SpO2 98%   BMI 39.14 kg/m?   ? ?PHQ-9:  ? ?  03/24/2022  ?  2:29 PM 01/08/2022  ?  9:41 AM 12/02/2021  ?  2:23 PM  ?Depression screen PHQ 2/9  ?Decreased Interest 0 0 0  ?Down, Depressed, Hopeless 1 0 0  ?PHQ - 2 Score 1 0 0  ?Altered sleeping 1 0 2  ?Tired, decreased energy 0 0 2  ?Change in appetite 0 0 0  ?Feeling bad or failure about yourself  0 0 0  ?Trouble concentrating 0 0 0  ?Moving  slowly or fidgety/restless 0 0 2  ?Suicidal thoughts 0 0 0  ?PHQ-9 Score 2 0 6  ?  ?Physical Exam ?General: Awake, alert, oriented ?Cardiovascular: Regular rate and rhythm, S1 and S2 present, no murmurs auscultated ?Respiratory: Lung fields clear to auscultation bilaterally ?Extremities: Trace BLE pitting to level of knee bilaterally ? ?ASSESSMENT/PLAN:  ? ?Essential hypertension, benign ?Elevated.  Patient developing trace BLE edema despite being on amlodipine 10 for quite some time.  We will add HCTZ 12.5 mg daily and reassess in 2 weeks.  BMP collected today for kidney function. ? ?Hyperlipemia, secondary prevention (LDL <70) ?Direct LDL 110 on 2/8, goal for secondary prevention <70.  We will switch to atorvastatin 80 mg daily, recheck in 3 months.  Should consider additional agent such as Zetia if not at goal within that time. ? ?Type 2 diabetes mellitus with diabetic neuropathy, unspecified (HCC) ?Currently tolerating Mounjaro and 4/8 well without adverse side effects.  Last A1c 6.6 in early March.  Next A1c September. ?  ? ? ?October, MD ?[facility]  ?

## 2022-03-24 NOTE — Patient Instructions (Signed)
It was wonderful to see you today. Thank you for allowing me to be a part of your care. Below is a short summary of what we discussed at your visit today: ? ?Blood pressure ?Continue taking the amlodipine 10 mg a day. ?Start taking the HCTZ (hydrochlorothiazide, a water pill) every day. ?Come back in about 2 weeks for a nurse only visit to recheck her blood pressure. ? ?Cholesterol ?Your LDL cholesterol (bad cholesterol) is too high.  Your last value was 110 back in February.  For somebody like you with diabetes and history of stroke, we want your LDL to be below 70.  This provides you the best protection from future stroke. ? ?I am going to increase your rosuvastatin (Crestor, for cholesterol) to the next dose up.  Instead of 40 mg, you will take 80 mg a day.  Because the Crestor tablet does not come in any higher doses than 40, you will need to take 2 of the 40 mg tablets every day. ? ?Come back for a lab only visit in 3 months for recheck of your cholesterol. ? ? ?Please bring all of your medications to every appointment! ? ?If you have any questions or concerns, please do not hesitate to contact us via phone or MyChart message.  ? ?Fayette Pho, MD  ?

## 2022-03-24 NOTE — Assessment & Plan Note (Signed)
Direct LDL 110 on 2/8, goal for secondary prevention <70.  We will switch to atorvastatin 80 mg daily, recheck in 3 months.  Should consider additional agent such as Zetia if not at goal within that time. ?

## 2022-03-24 NOTE — Assessment & Plan Note (Signed)
Elevated.  Patient developing trace BLE edema despite being on amlodipine 10 for quite some time.  We will add HCTZ 12.5 mg daily and reassess in 2 weeks.  BMP collected today for kidney function. ?

## 2022-03-24 NOTE — Assessment & Plan Note (Signed)
Currently tolerating Mounjaro and [place] well without adverse side effects.  Last A1c 6.6 in early March.  Next A1c September. ?

## 2022-04-10 ENCOUNTER — Ambulatory Visit (INDEPENDENT_AMBULATORY_CARE_PROVIDER_SITE_OTHER): Payer: Medicare Other | Admitting: Family Medicine

## 2022-04-10 ENCOUNTER — Encounter: Payer: Self-pay | Admitting: Family Medicine

## 2022-04-10 DIAGNOSIS — Z0000 Encounter for general adult medical examination without abnormal findings: Secondary | ICD-10-CM | POA: Diagnosis not present

## 2022-04-10 DIAGNOSIS — I10 Essential (primary) hypertension: Secondary | ICD-10-CM

## 2022-04-10 MED ORDER — HYDROCHLOROTHIAZIDE 12.5 MG PO CAPS: 12.5000 mg | ORAL_CAPSULE | Freq: Every day | ORAL | 0 refills | Status: DC

## 2022-04-10 NOTE — Assessment & Plan Note (Signed)
Recommended shingles vaccination.

## 2022-04-10 NOTE — Patient Instructions (Signed)
Thank you for coming to see me today. It was a pleasure. Today we discussed your blood pressure, it looks great, continue current medications. I have refilled BP medication.  Reminder to get shingles vaccine from pharmacy.  Please follow-up with PCP as needed   If you have any questions or concerns, please do not hesitate to call the office at [phone].  Best wishes,   Dr Allena Katz

## 2022-04-10 NOTE — Assessment & Plan Note (Signed)
BP 120/82 today, at goal. Greatly improved with addition of HCTZ. Refilled HCTZ for pt. Obtained BMP today.

## 2022-04-10 NOTE — Progress Notes (Signed)
     SUBJECTIVE:   CHIEF COMPLAINT / HPI:   Kathleen Larson is a 56 y.o. female presents for HTN follow up    Hypertension: Patient's current antihypertensive  medications include: amlodipine 10mg , HCTZ 12.5mg . Compliant with medications and tolerating well without side effects.  Checking BP at home with readings between 135/87.  Denies any SOB, CP, vision changes, LE edema, medication SEs, or symptoms of hypotension.   Most recent creatinine trend:  Lab Results  Component Value Date   CREATININE 1.13 (H) 01/08/2022   CREATININE 1.25 (H) 12/17/2021   CREATININE 0.98 11/13/2021     Patient has had a BMP in the past 1 year.   Flowsheet Row Office Visit from 04/10/2022 in [facility]  PHQ-9 Total Score 1         PERTINENT  PMH / PSH: HTN, HLD, DM  OBJECTIVE:   BP 120/82   Pulse 74   Ht 5\' 4"  (1.626 m)   Wt 227 lb (103 kg)   LMP 10/14/2012   SpO2 98%   BMI 38.96 kg/m    General: Alert, no acute distress Cardio: well perfused  Pulm: normal work of breathing Neuro: Cranial nerves grossly intact   ASSESSMENT/PLAN:   Essential hypertension, benign BP 120/82 today, at goal. Greatly improved with addition of HCTZ. Refilled HCTZ for pt. Obtained BMP today.   Health maintenance examination Recommended shingles vaccination.     , MD PGY-3 [facility]

## 2022-04-11 LAB — BASIC METABOLIC PANEL
BUN/Creatinine Ratio: 12 (ref 9–23)
BUN: 12 mg/dL (ref 6–24)
CO2: 24 mmol/L (ref 20–29)
Calcium: 9.7 mg/dL (ref 8.7–10.2)
Chloride: 98 mmol/L (ref 96–106)
Creatinine, Ser: 1.01 mg/dL — ABNORMAL HIGH (ref 0.57–1.00)
Glucose: 137 mg/dL — ABNORMAL HIGH (ref 70–99)
Potassium: 3.3 mmol/L — ABNORMAL LOW (ref 3.5–5.2)
Sodium: 140 mmol/L (ref 134–144)
eGFR: 65 mL/min/{1.73_m2} (ref >59)

## 2022-06-26 ENCOUNTER — Other Ambulatory Visit: Payer: Self-pay | Admitting: Family Medicine

## 2022-06-26 DIAGNOSIS — I10 Essential (primary) hypertension: Secondary | ICD-10-CM

## 2022-08-20 ENCOUNTER — Ambulatory Visit (INDEPENDENT_AMBULATORY_CARE_PROVIDER_SITE_OTHER): Payer: Medicare Other | Admitting: Family Medicine

## 2022-08-20 ENCOUNTER — Encounter: Payer: Self-pay | Admitting: Family Medicine

## 2022-08-20 VITALS — BP 130/84 | HR 81 | Ht 64.0 in | Wt 233.4 lb

## 2022-08-20 DIAGNOSIS — I10 Essential (primary) hypertension: Secondary | ICD-10-CM | POA: Diagnosis not present

## 2022-08-20 DIAGNOSIS — R7989 Other specified abnormal findings of blood chemistry: Secondary | ICD-10-CM | POA: Diagnosis not present

## 2022-08-20 DIAGNOSIS — Z23 Encounter for immunization: Secondary | ICD-10-CM

## 2022-08-20 DIAGNOSIS — E785 Hyperlipidemia, unspecified: Secondary | ICD-10-CM

## 2022-08-20 DIAGNOSIS — E1140 Type 2 diabetes mellitus with diabetic neuropathy, unspecified: Secondary | ICD-10-CM

## 2022-08-20 LAB — POCT GLYCOSYLATED HEMOGLOBIN (HGB A1C): HbA1c, POC (controlled diabetic range): 6.6 % (ref 0.0–7.0)

## 2022-08-20 MED ORDER — LOSARTAN POTASSIUM-HCTZ 50-12.5 MG PO TABS: 1.0000 | ORAL_TABLET | Freq: Every day | ORAL | 2 refills | Status: DC

## 2022-08-20 MED ORDER — AMLODIPINE BESYLATE 10 MG PO TABS: 5.0000 mg | ORAL_TABLET | Freq: Every day | ORAL | 3 refills | Status: DC

## 2022-08-20 NOTE — Progress Notes (Signed)
SUBJECTIVE:   CHIEF COMPLAINT / HPI:   T2DM check in Last A1c 6.6 in March, she is now due for recheck.  She reports adherence to her regimen of Farxiga 10 mg, Mounjaro 5 mg weekly, and atorvastatin 80 mg.  No complaints at this time.    Lab Results  Component Value Date   HGBA1C 6.6 08/20/2022   HGBA1C 6.6 01/08/2022   HGBA1C 6.9 (H) 10/16/2021   Lab Results  Component Value Date   MICROALBUR 0.85 03/22/2009   LDLCALC 163 (H) 10/16/2021   CREATININE 1.01 (H) 04/10/2022   HLD check in: Secondary prevention Current regimen atorvastatin 80 mg.  Tolerating well without adverse side effect.  Last LDL measurement 110 on 12/17/2021, goal is <70 due to T2DM and history of prior CVA.  We will remeasure today.    Hypertension Current regimen includes amlodipine 10 mg and HCTZ 12.5 mg.  Patient complains of bilateral leg edema "for a while now".  Last December, was on amlodipine 10 and lisinopril-HCTZ 20-12.5 mg.  The lisinopril-HCTZ was discontinued due to AKI.  Next tried valsartan, but patient believed her legs were swelling due to that, so was discontinued.  Patient ended up on regimen of the amlodipine and HCTZ.  BP Readings from Last 3 Encounters:  08/20/22 130/84  04/10/22 120/82  03/24/22 (!) 145/104    PERTINENT  PMH / PSH: HTN, T2DM, HLD, hearing loss  OBJECTIVE:   BP 130/84   Pulse 81   Ht 5\' 4"  (1.626 m)   Wt 233 lb 6.4 oz (105.9 kg)   LMP 10/14/2012   SpO2 99%   BMI 40.06 kg/m    PHQ-9:     08/20/2022    2:39 PM 04/10/2022    9:10 AM 03/24/2022    2:29 PM  Depression screen PHQ 2/9  Decreased Interest 0 0 0  Down, Depressed, Hopeless 1 0 1  PHQ - 2 Score 1 0 1  Altered sleeping 1 1 1   Tired, decreased energy 0 0 0  Change in appetite 0 0 0  Feeling bad or failure about yourself  0 0 0  Trouble concentrating 0 0 0  Moving slowly or fidgety/restless 0 0 0  Suicidal thoughts 0 0 0  PHQ-9 Score 2 1 2   Difficult doing work/chores Not difficult at all       Physical Exam General: Awake, alert, oriented Cardiovascular: Regular rate and rhythm, S1 and S2 present, no murmurs auscultated Respiratory: Lung fields clear to auscultation bilaterally Extremities: 1+ pitting edema of BLE  ASSESSMENT/PLAN:   Type 2 diabetes mellitus with diabetic neuropathy, unspecified (HCC) Stable, A1c 6.6 today.  Continue Farxiga, Mounjaro, and atorvastatin.  We will collect urine microalbumin at this time.  Hyperlipemia, secondary prevention (LDL <70) Recheck direct LDL today.  Currently on atorvastatin 80 mg, tolerating well.  Essential hypertension, benign Slightly worsened, now in range of stage I hypertension.  Previous regimen was 10 mg amlodipine and, 5 mg HCTZ.  Believe leg edema likely due to amlodipine. - Decrease amlodipine from 10 mg to 5 mg daily - DC HCTZ single pill - Start losartan-HCTZ 50-12.5 mg - BMP today - Come back in 1 to 2 weeks for recheck, we will obtain BMP at that time.  Would tolerate expected creatinine increase, given need for BP control.  Also needs ACE or ARB due to T2DM.  Need for immunization against influenza Tolerated well, no adverse side effects.    Ezequiel Essex, MD [facility]

## 2022-08-20 NOTE — Assessment & Plan Note (Signed)
Stable, A1c 6.6 today.  Continue Farxiga, Mounjaro, and atorvastatin.  We will collect urine microalbumin at this time.

## 2022-08-20 NOTE — Assessment & Plan Note (Signed)
Tolerated well, no adverse side effects.

## 2022-08-20 NOTE — Assessment & Plan Note (Signed)
Recheck direct LDL today.  Currently on atorvastatin 80 mg, tolerating well.

## 2022-08-20 NOTE — Patient Instructions (Addendum)
It was wonderful to see you today. Thank you for allowing me to be a part of your care. Below is a short summary of what we discussed at your visit today:  Diabetes Your A1c today is 6.6.  We collected blood and urine samples to see how your kidneys are doing. If the results are normal, I will send you a letter or MyChart message. If the results are abnormal, I will give you a call.    Blood pressure I think the amlodipine is making your legs swell.  Lets make some changes. 1.  Start taking half of your current amlodipine.  You have been taking amlodipine 10 mg.  I want you now to take 5 mg.  Try to cut your current amlodipine pills in half.  Take 1/2 pill/day.  If it is too difficult to cut the pills in half, please call me and I will send in a new prescription for the 5 mg tablets. 2.  Start taking the combo pill with losartan and your fluid pill called HCTZ.  I have sent this new prescription to your pharmacy.  Come back in 1 to 2 weeks for blood pressure recheck and more labs.  Cholesterol We obtained a cholesterol check.  Today's lab does not require you to be fasting. As above, if the results are normal, I will send you a letter or MyChart message. If the results are abnormal, I will give you a call.    Health Maintenance We like to think about ways to keep you healthy for years to come. Below are some interventions and screenings we can offer to keep you healthy: - Shingles vaccine (can be obtained at your favorite pharmacy)   Please bring all of your medications to every appointment!  If you have any questions or concerns, please do not hesitate to contact us via phone or MyChart message.   Ezequiel Essex, MD

## 2022-08-20 NOTE — Assessment & Plan Note (Signed)
Slightly worsened, now in range of stage I hypertension.  Previous regimen was 10 mg amlodipine and, 5 mg HCTZ.  Believe leg edema likely due to amlodipine. - Decrease amlodipine from 10 mg to 5 mg daily - DC HCTZ single pill - Start losartan-HCTZ 50-12.5 mg - BMP today - Come back in 1 to 2 weeks for recheck, we will obtain BMP at that time.  Would tolerate expected creatinine increase, given need for BP control.  Also needs ACE or ARB due to T2DM.

## 2022-08-21 ENCOUNTER — Encounter: Payer: Self-pay | Admitting: Family Medicine

## 2022-08-21 LAB — LDL CHOLESTEROL, DIRECT: LDL Direct: 65 mg/dL (ref 0–99)

## 2022-08-21 LAB — BASIC METABOLIC PANEL
BUN/Creatinine Ratio: 22 (ref 9–23)
BUN: 19 mg/dL (ref 6–24)
CO2: 23 mmol/L (ref 20–29)
Calcium: 9.8 mg/dL (ref 8.7–10.2)
Chloride: 103 mmol/L (ref 96–106)
Creatinine, Ser: 0.87 mg/dL (ref 0.57–1.00)
Glucose: 98 mg/dL (ref 70–99)
Potassium: 3.8 mmol/L (ref 3.5–5.2)
Sodium: 141 mmol/L (ref 134–144)
eGFR: 77 mL/min/{1.73_m2} (ref >59)

## 2022-08-21 LAB — MICROALBUMIN / CREATININE URINE RATIO
Creatinine, Urine: 71.8 mg/dL
Microalb/Creat Ratio: 5 mg/g creat (ref 0–29)
Microalbumin, Urine: 3.7 ug/mL

## 2022-08-28 ENCOUNTER — Other Ambulatory Visit: Payer: Self-pay | Admitting: Family Medicine

## 2022-08-28 DIAGNOSIS — E1169 Type 2 diabetes mellitus with other specified complication: Secondary | ICD-10-CM

## 2022-08-31 ENCOUNTER — Encounter (HOSPITAL_COMMUNITY): Payer: Self-pay | Admitting: *Deleted

## 2022-08-31 ENCOUNTER — Other Ambulatory Visit: Payer: Self-pay

## 2022-08-31 ENCOUNTER — Ambulatory Visit (HOSPITAL_COMMUNITY)
Admission: EM | Admit: 2022-08-31 | Discharge: 2022-08-31 | Disposition: A | Discharge status: Home / Self Care | Payer: Medicare Other | Attending: Physician Assistant | Admitting: Physician Assistant

## 2022-08-31 DIAGNOSIS — S161XXA Strain of muscle, fascia and tendon at neck level, initial encounter: Secondary | ICD-10-CM | POA: Diagnosis not present

## 2022-08-31 MED ORDER — KETOROLAC TROMETHAMINE 30 MG/ML IJ SOLN
INTRAMUSCULAR | Status: AC
  Filled 2022-08-31: qty 1

## 2022-08-31 MED ORDER — KETOROLAC TROMETHAMINE 30 MG/ML IJ SOLN
30.0000 mg | Freq: Once | INTRAMUSCULAR | Status: AC
  Administered 2022-08-31: 30 mg via INTRAMUSCULAR

## 2022-08-31 MED ORDER — BACLOFEN 10 MG PO TABS: 10.0000 mg | ORAL_TABLET | Freq: Three times a day (TID) | ORAL | 0 refills | Status: DC

## 2022-08-31 NOTE — Discharge Instructions (Signed)
Take baclofen as needed for muscle spasm Can take Tylenol or Ibuprofen as needed Recommend ice to affected area and stretching.  Return if you develop new or worsening sx.
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
## 2022-09-04 ENCOUNTER — Encounter: Payer: Self-pay | Admitting: Family Medicine

## 2022-09-04 ENCOUNTER — Ambulatory Visit (INDEPENDENT_AMBULATORY_CARE_PROVIDER_SITE_OTHER): Payer: Medicare Other | Admitting: Family Medicine

## 2022-09-04 VITALS — BP 126/86 | HR 96 | Ht 64.0 in | Wt 232.1 lb

## 2022-09-04 DIAGNOSIS — E1169 Type 2 diabetes mellitus with other specified complication: Secondary | ICD-10-CM | POA: Diagnosis not present

## 2022-09-04 DIAGNOSIS — G3184 Mild cognitive impairment, so stated: Secondary | ICD-10-CM

## 2022-09-04 DIAGNOSIS — K219 Gastro-esophageal reflux disease without esophagitis: Secondary | ICD-10-CM | POA: Diagnosis not present

## 2022-09-04 DIAGNOSIS — E785 Hyperlipidemia, unspecified: Secondary | ICD-10-CM | POA: Diagnosis not present

## 2022-09-04 DIAGNOSIS — T148XXA Other injury of unspecified body region, initial encounter: Secondary | ICD-10-CM

## 2022-09-04 DIAGNOSIS — E1140 Type 2 diabetes mellitus with diabetic neuropathy, unspecified: Secondary | ICD-10-CM

## 2022-09-04 DIAGNOSIS — M7061 Trochanteric bursitis, right hip: Secondary | ICD-10-CM

## 2022-09-04 DIAGNOSIS — M25551 Pain in right hip: Secondary | ICD-10-CM | POA: Diagnosis not present

## 2022-09-04 DIAGNOSIS — B353 Tinea pedis: Secondary | ICD-10-CM

## 2022-09-04 DIAGNOSIS — I10 Essential (primary) hypertension: Secondary | ICD-10-CM | POA: Diagnosis not present

## 2022-09-04 DIAGNOSIS — F322 Major depressive disorder, single episode, severe without psychotic features: Secondary | ICD-10-CM

## 2022-09-04 MED ORDER — DAPAGLIFLOZIN PROPANEDIOL 10 MG PO TABS: 10.0000 mg | ORAL_TABLET | Freq: Every day | ORAL | 1 refills | Status: DC

## 2022-09-04 MED ORDER — SERTRALINE HCL 50 MG PO TABS: 50.0000 mg | ORAL_TABLET | Freq: Every day | ORAL | 3 refills | Status: DC

## 2022-09-04 MED ORDER — ONETOUCH DELICA LANCING DEV MISC: 1 refills | Status: AC

## 2022-09-04 MED ORDER — TRAMADOL HCL 50 MG PO TABS: 50.0000 mg | ORAL_TABLET | Freq: Four times a day (QID) | ORAL | 0 refills | Status: DC | PRN

## 2022-09-04 MED ORDER — ONETOUCH VERIO VI STRP: ORAL_STRIP | 12 refills | Status: AC

## 2022-09-04 MED ORDER — CLOTRIMAZOLE 1 % EX CREA: 1.0000 | TOPICAL_CREAM | Freq: Two times a day (BID) | CUTANEOUS | 1 refills | Status: DC

## 2022-09-04 MED ORDER — AMLODIPINE BESYLATE 5 MG PO TABS: 5.0000 mg | ORAL_TABLET | Freq: Every day | ORAL | 3 refills | Status: DC

## 2022-09-04 MED ORDER — FAMOTIDINE 20 MG PO TABS: 20.0000 mg | ORAL_TABLET | Freq: Two times a day (BID) | ORAL | 3 refills | Status: DC

## 2022-09-04 NOTE — Assessment & Plan Note (Signed)
Refilled lancets and test strips along with [place].  Tolerating well.  No adverse effects.
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
## 2022-09-04 NOTE — Assessment & Plan Note (Signed)
Refill Zoloft.  Tolerating well without adverse side effects.  PHQ-9 reviewed and negative.

## 2022-09-04 NOTE — Assessment & Plan Note (Signed)
Blood pressure greatly improved.  Continue regimen of amlodipine 5 mg and losartan-HCTZ 50-12.5 mg. Check BMP today, will tolerate slight creatinine bump expected with ARB's.

## 2022-09-04 NOTE — Patient Instructions (Addendum)
It was wonderful to see you today. Thank you for allowing me to be a part of your care. Below is a short summary of what we discussed at your visit today:  Blood pressure Today we measured your blood pressure after the changes that were made a couple weeks ago. We also obtained blood work (BMP) to check in on your kidneys with this new medicine.   Continue the amlodipine 5 mg tablets and the losartan-hydrochlorothiazide (HYZAAR) 50-12.5 mg tablet as prescribed.   Medication refills I sent in medication refills requested.  Please let me know if you run into any problems with the pharmacy.  Shingles vaccine You are eligible for the shingles vaccine. You may get this at your pharmacy.   Please bring all of your medications to every appointment!  If you have any questions or concerns, please do not hesitate to contact us via phone or MyChart message.   Ezequiel Essex, MD

## 2022-09-05 LAB — BASIC METABOLIC PANEL
BUN/Creatinine Ratio: 13 (ref 9–23)
BUN: 16 mg/dL (ref 6–24)
CO2: 25 mmol/L (ref 20–29)
Calcium: 10.1 mg/dL (ref 8.7–10.2)
Chloride: 100 mmol/L (ref 96–106)
Creatinine, Ser: 1.24 mg/dL — ABNORMAL HIGH (ref 0.57–1.00)
Glucose: 101 mg/dL — ABNORMAL HIGH (ref 70–99)
Potassium: 4.1 mmol/L (ref 3.5–5.2)
Sodium: 141 mmol/L (ref 134–144)
eGFR: 50 mL/min/{1.73_m2} — ABNORMAL LOW (ref >59)

## 2022-09-07 ENCOUNTER — Telehealth: Payer: Self-pay | Admitting: Family Medicine

## 2022-09-07 DIAGNOSIS — R7989 Other specified abnormal findings of blood chemistry: Secondary | ICD-10-CM

## 2022-09-07 DIAGNOSIS — I10 Essential (primary) hypertension: Secondary | ICD-10-CM

## 2022-09-07 NOTE — Telephone Encounter (Signed)
Called patient regarding increased creatinine.  Confirmed correct patient with date of birth.  Will need renal [place] w/ doppler to r/o renal artery stenosis. Continue amlodipine 5 and losartan-HCTZ 50-12.5.  Patient prefers early morning scheduling if possible.  Ezequiel Essex, MD

## 2022-09-17 ENCOUNTER — Telehealth: Payer: Self-pay | Admitting: Family Medicine

## 2022-09-17 ENCOUNTER — Ambulatory Visit
Admission: RE | Admit: 2022-09-17 | Discharge: 2022-09-17 | Disposition: A | Discharge status: Home / Self Care | Payer: Medicare Other | Source: Ambulatory Visit | Attending: Family Medicine | Admitting: Family Medicine

## 2022-09-17 ENCOUNTER — Encounter: Payer: Self-pay | Admitting: Family Medicine

## 2022-09-17 DIAGNOSIS — N2889 Other specified disorders of kidney and ureter: Secondary | ICD-10-CM | POA: Diagnosis not present

## 2022-09-17 DIAGNOSIS — R7989 Other specified abnormal findings of blood chemistry: Secondary | ICD-10-CM

## 2022-09-17 DIAGNOSIS — I10 Essential (primary) hypertension: Secondary | ICD-10-CM | POA: Diagnosis not present

## 2022-09-17 NOTE — Telephone Encounter (Signed)
Called to discuss normal renal US w/ doppler. No RAS on study. Will continue with ARB and routine creatinine checks every 4 to 6 months.   No answer, left voicemail containing general results without patient identifiers.  Fayette Pho, MD

## 2022-09-17 NOTE — Telephone Encounter (Signed)
Patient returns call to nurse line. Advised of results per note from Dr. Larita Fife.   Veronda Prude, RN

## 2022-09-24 ENCOUNTER — Other Ambulatory Visit: Payer: Self-pay | Admitting: Family Medicine

## 2022-09-24 DIAGNOSIS — Z1231 Encounter for screening mammogram for malignant neoplasm of breast: Secondary | ICD-10-CM

## 2022-11-18 ENCOUNTER — Ambulatory Visit
Admission: RE | Admit: 2022-11-18 | Discharge: 2022-11-18 | Disposition: A | Discharge status: Home / Self Care | Payer: Medicare Other | Source: Ambulatory Visit | Attending: Family Medicine | Admitting: Family Medicine

## 2022-11-18 DIAGNOSIS — Z1231 Encounter for screening mammogram for malignant neoplasm of breast: Secondary | ICD-10-CM | POA: Diagnosis not present

## 2022-11-19 ENCOUNTER — Encounter: Payer: Self-pay | Admitting: Family Medicine

## 2022-11-23 ENCOUNTER — Other Ambulatory Visit: Payer: Self-pay | Admitting: Family Medicine

## 2022-11-23 DIAGNOSIS — R7989 Other specified abnormal findings of blood chemistry: Secondary | ICD-10-CM

## 2022-11-23 DIAGNOSIS — I10 Essential (primary) hypertension: Secondary | ICD-10-CM

## 2022-12-17 ENCOUNTER — Telehealth: Payer: Self-pay | Admitting: Family Medicine

## 2022-12-17 NOTE — Telephone Encounter (Signed)
Left message for patient to call back and schedule Medicare Annual Wellness Visit (AWV).   [place] offer to do virtually or by telephone.  Left office number and my jabber [phone].  Last AWV:11/15/2019   Please schedule at anytime with Nurse Health Advisor.

## 2022-12-21 ENCOUNTER — Other Ambulatory Visit: Payer: Self-pay | Admitting: Family Medicine

## 2022-12-21 DIAGNOSIS — I10 Essential (primary) hypertension: Secondary | ICD-10-CM

## 2022-12-27 NOTE — Progress Notes (Unsigned)
I connected with  Kathleen Larson on 12/28/2022 by a audio enabled telemedicine application and verified that I am speaking with the correct person using two identifiers.  Patient Location: Home  Provider Location: Home Office  I discussed the limitations of evaluation and management by telemedicine. The patient expressed understanding and agreed to proceed.  Subjective:   Kathleen Larson is a 57 y.o. female who presents for Medicare Annual (Subsequent) preventive examination.  Review of Systems    Per HPI unless specifically indicated below.  Cardiac Risk Factors include: advanced age (>62mn, >>37women);female gender, Essential Hypertension, and Hyperlipidemia.           Objective:       09/04/2022    2:17 PM 08/31/2022    7:55 PM 08/20/2022    3:09 PM  Vitals with BMI  Height 5' 4"$     Weight 232 lbs 2 oz    BMI [id number]   Systolic [id number] Diastolic 86 94 84  Pulse 96 80     Today's Vitals   12/28/22 0913  PainSc: 9    There is no height or weight on file to calculate BMI.     12/28/2022    9:22 AM 09/04/2022    2:16 PM 08/20/2022    2:39 PM 03/24/2022    2:29 PM 01/08/2022    9:42 AM 12/02/2021    2:31 PM 12/02/2021    2:22 PM  Advanced Directives  Does Patient Have a Medical Advance Directive? No No No No No  Yes  Would patient like information on creating a medical advance directive? No - Patient declined No - Patient declined  No - Patient declined No - Patient declined No - Patient declined No - Patient declined    Current Medications (verified) Outpatient Encounter Medications as of 12/28/2022  Medication Sig   amLODipine (NORVASC) 5 MG tablet Take 1 tablet (5 mg total) by mouth at bedtime.   aspirin 325 MG tablet Take 1 tablet (325 mg total) by mouth daily.   atorvastatin (LIPITOR) 80 MG tablet Take 1 tablet (80 mg total) by mouth daily.   baclofen (LIORESAL) 10 MG tablet Take 1 tablet (10 mg total) by mouth 3 (three) times daily.   Blood  Glucose Monitoring Suppl (ONETOUCH VERIO) w/Device KIT Please use to check blood sugars, twice daily. E11.69   Blood Pressure KIT Take your blood pressure one a day or once every other day. Use it while sitting down with your feet on the floor and your back resting in the chair.   clotrimazole (LOTRIMIN) 1 % cream Apply 1 Application topically 2 (two) times daily.   dapagliflozin propanediol (FARXIGA) 10 MG TABS tablet Take 1 tablet (10 mg total) by mouth daily.   famotidine (PEPCID) 20 MG tablet Take 1 tablet (20 mg total) by mouth 2 (two) times daily.   glucose blood (ONETOUCH VERIO) test strip Please use to check blood sugars, twice daily. E11.69   Lancet Devices (ONE TOUCH DELICA LANCING DEV) MISC Please use to check blood sugars, twice daily. E11.69   losartan-hydrochlorothiazide (HYZAAR) 50-12.5 MG tablet Take 1 tablet by mouth once daily   sertraline (ZOLOFT) 50 MG tablet Take 1 tablet (50 mg total) by mouth daily.   tirzepatide ([facility] 5 MG/0.5ML Pen Inject 5 mg into the skin once a week.   traMADol (ULTRAM) 50 MG tablet Take 1 tablet (50 mg total) by mouth every 6 (six) hours as needed.   No facility-administered  encounter medications on file as of 12/28/2022.    Allergies (verified) Shrimp [shellfish allergy]   History: Past Medical History:  Diagnosis Date   Abnormal EKG    Allergy    Back pain 10/16/2021   CONSTIPATION 03/22/2009   Qualifier: Diagnosis of  By: Hassell Done FNP, [place]     Diabetes mellitus without complication ([place])    Epiploic appendagitis 09/26/2018   Esophageal dysphagia 03/10/2018   Essential hypertension    GERD (gastroesophageal reflux disease)    Greater trochanteric bursitis of right hip 05/26/2017   Grief 10/16/2021   Healthcare maintenance [id number]   HELICOBACTER PYLORI INFECTION, HX OF 01/28/2009   Qualifier: Diagnosis of  By: Hassell Done FNP, [place]     Hyperlipidemia    Hypertension    Knee buckling, left 09/19/2020   Left knee pain 10/18/2020    Lesion of tongue 10/22/2017   LGSIL on Pap smear of cervix 08/14/2019   Medical non-compliance    Mild cognitive impairment 12/27/2017   Neck muscle strain 09/10/2016   Stroke [facility])    CVA 2017   Type 2 diabetes mellitus with other specified complication ([place]) [id number]   Complication is CVA   Yeast dermatitis 03/01/2019   Past Surgical History:  Procedure Laterality Date   TUBAL LIGATION     Family History  Problem Relation Age of Onset   Kidney disease Mother    Hypertension Father    Kidney disease Brother    Asthma Daughter    Colon cancer Neg Hx    Stomach cancer Neg Hx    Esophageal cancer Neg Hx    Social History   Socioeconomic History   Marital status: Single    Spouse name: Not on file   Number of children: 1   Years of education: 12   Highest education level: High school graduate  Occupational History   Occupation: Art therapist   Occupation: Marine scientist    Occupation: Disable  Tobacco Use   Smoking status: Never    Passive exposure: Current   Smokeless tobacco: Never  Vaping Use   Vaping Use: Never used  Substance and Sexual Activity   Alcohol use: Yes    Comment: occassional wine   Drug use: No   Sexual activity: Not Currently  Other Topics Concern   Not on file  Social History Narrative   Patient lives alone in [place].   Patient enjoys watching TV and spending time with her sister and daughter.    Patient enjoyed playing basketball before her CVA in 2017.   Patient walks with friends for exercise.   Social Determinants of Health   Financial Resource Strain: Low Risk  (12/28/2022)   Overall Financial Resource Strain (CARDIA)    Difficulty of Paying Living Expenses: Not hard at all  Food Insecurity: No Food Insecurity (12/28/2022)   Hunger Vital Sign    Worried About Running Out of Food in the Last Year: Never true    Ran Out of Food in the Last Year: Never true  Transportation Needs: No Transportation Needs (12/28/2022)   PRAPARE -  Hydrologist (Medical): No    Lack of Transportation (Non-Medical): No  Physical Activity: Insufficiently Active (12/28/2022)   Exercise Vital Sign    Days of Exercise per Week: 7 days    Minutes of Exercise per Session: 20 min  Stress: No Stress Concern Present (12/28/2022)   [place]    Feeling of Stress :  Only a little  Social Connections: Moderately Isolated (12/28/2022)   Social Connection and Isolation Panel [NHANES]    Frequency of Communication with Friends and Family: More than three times a week    Frequency of Social Gatherings with Friends and Family: More than three times a week    Attends Religious Services: More than 4 times per year    Active Member of [employer] or Organizations: No    Attends Archivist Meetings: Never    Marital Status: Never married    Tobacco Counseling Counseling given: No   Clinical Intake:  Pre-visit preparation completed: No  Pain : 0-10 Pain Score: 9  Pain Type: Acute pain Pain Location: Back Pain Orientation: Lower Pain Descriptors / Indicators: Throbbing Pain Onset: In the past 7 days Pain Frequency: Occasional     Nutritional Status: BMI > 30  Obese Nutritional Risks: None Diabetes: Yes CBG done?: Yes CBG resulted in Enter/ Edit results?: No Did pt. bring in CBG monitor from home?: No Glucose Meter Downloaded?: No  How often do you need to have someone help you when you read instructions, pamphlets, or other written materials from your doctor or pharmacy?: 1 - Never  Diabetic?Nutrition Risk Assessment:  Has the patient had any N/V/D within the last 2 months?  No  Does the patient have any non-healing wounds?  No  Has the patient had any unintentional weight loss or weight gain?  No   Diabetes:  Is the patient diabetic?  Yes  If diabetic, was a CBG obtained today?  Yes  Did the patient bring in their glucometer from  home?  No  How often do you monitor your CBG's? 3 times a week.   Financial Strains and Diabetes Management:  Are you having any financial strains with the device, your supplies or your medication? No .  Does the patient want to be seen by Chronic Care Management for management of their diabetes?  No  Would the patient like to be referred to a Nutritionist or for Diabetic Management?  No   Diabetic Exams:  Diabetic Eye Exam: Completed 01/29/2022 Diabetic Foot Exam: Overdue, Pt has been advised about the importance in completing this exam. Pt is scheduled for diabetic foot exam on next diabetic exam.   Interpreter Needed?: No  Information entered by :: Donnie [place], [place]   Activities of Daily Living    12/28/2022    9:10 AM  In your present state of health, do you have any difficulty performing the following activities:  Hearing? 1  Comment hearing aids  Vision? 1  Difficulty concentrating or making decisions? 0  Walking or climbing stairs? 1  Dressing or bathing? 0  Doing errands, shopping? 0    Patient Care Team: Ezequiel Essex, MD as PCP - General (Family Medicine)  Indicate any recent Medical Services you may have received from other than [facility] providers in the past year (date may be approximate).     Assessment:   This is a routine wellness examination for [facility].  Hearing/Vision screen Wear bilateral hearing aids  Denies any vision changes. Annual Eye Exam at [place] issues and exercise activities discussed: Current Exercise Habits: Structured exercise class, Type of exercise: walking, Time (Minutes): 15, Frequency (Times/Week): 7, Weekly Exercise (Minutes/Week): 105, Intensity: Mild, Exercise limited by: orthopedic condition(s)   Goals Addressed   None    Depression Screen    12/28/2022    9:09 AM 09/04/2022    2:17 PM 08/20/2022  2:39 PM 04/10/2022    9:10 AM 03/24/2022    2:29 PM 01/08/2022    9:41 AM 12/02/2021    2:23 PM   PHQ 2/9 Scores  PHQ - 2 Score 0 0 1 0 1 0 0  PHQ- 9 Score  0 2 1 2 $ 0 6    Fall Risk    12/28/2022    9:09 AM 06/13/2021   11:48 AM 05/15/2021   10:48 AM 09/19/2020    9:57 AM 06/27/2020   11:39 AM  Fall Risk   Falls in the past year? 0 1 1 1 1  $ Number falls in past yr: 0 0 0 0 1  Injury with Fall? 0 1 1 1 $ 0  Risk for fall due to : No Fall Risks      Follow up Falls evaluation completed    Falls prevention discussed;Education provided    FALL RISK PREVENTION PERTAINING TO THE HOME:  Any stairs in or around the home? No  If so, are there any without handrails? No  Home free of loose throw rugs in walkways, pet beds, electrical cords, etc? Yes  Adequate lighting in your home to reduce risk of falls? Yes   ASSISTIVE DEVICES UTILIZED TO PREVENT FALLS:  Life alert? No  Use of a cane, walker or w/c? Yes  Grab bars in the bathroom? Yes  Shower chair or bench in shower? Yes  Elevated toilet seat or a handicapped toilet? No   TIMED UP AND GO:  Was the test performed?Unable to perform, virtual appointment   Cognitive Function:        12/28/2022    9:12 AM 11/15/2019    9:53 AM  6CIT Screen  What Year? 0 points 0 points  What month? 0 points 0 points  What time? 0 points 0 points  Count back from 20 0 points 0 points  Months in reverse 0 points 0 points  Repeat phrase 2 points 4 points  Total Score 2 points 4 points    Immunizations Immunization History  Administered Date(s) Administered   Influenza Whole 09/19/2009   Influenza,inj,Quad PF,6+ Mos 09/10/2016, 10/22/2017, 08/15/2018, 08/14/2019, 09/19/2020, 10/29/2021, 08/20/2022   PFIZER Comirnaty(Gray Top)Covid-19 Tri-Sucrose Vaccine 12/04/2020, 06/20/2021   PFIZER(Purple Top)SARS-COV-2 Vaccination 03/26/2020, 04/16/2020   PNEUMOCOCCAL CONJUGATE-20 10/29/2021   Pfizer Covid-19 Vaccine Bivalent Booster 61yr & up 10/29/2021   Pneumococcal Polysaccharide-23 11/09/2005, 10/22/2017   Td 12/10/2005   Tdap 09/10/2016    Zoster Recombinat (Shingrix) 06/20/2021, 04/12/2022, 07/02/2022    TDAP status: Up to date  Flu Vaccine status: Up to date    Covid-19 vaccine status: Information provided on how to obtain vaccines.   Qualifies for Shingles Vaccine? Yes   Zostavax completed Yes   Shingle vaccine: Yes, completed   Screening Tests Health Maintenance  Topic Date Due   FOOT EXAM  06/27/2021   COVID-19 Vaccine (6 - 2023-24 season) 07/10/2022   OPHTHALMOLOGY EXAM  01/30/2023   HEMOGLOBIN A1C  02/19/2023   Diabetic kidney evaluation - Urine ACR  08/21/2023   Diabetic kidney evaluation - eGFR measurement  09/05/2023   Medicare Annual Wellness (AWV)  12/29/2023   PAP SMEAR-Modifier  08/13/2024   MAMMOGRAM  11/18/2024   DTaP/Tdap/Td (3 - Td or Tdap) 09/10/2026   COLONOSCOPY (Pts 45-410yrInsurance coverage will need to be confirmed)  05/24/2028   INFLUENZA VACCINE  Completed   Hepatitis C Screening  Completed   HIV Screening  Completed   Zoster Vaccines- Shingrix  Completed   HPV  [place] Maintenance  Health Maintenance Due  Topic Date Due   FOOT EXAM  06/27/2021   COVID-19 Vaccine (6 - 2023-24 season) 07/10/2022    Colorectal cancer screening: Type of screening: Colonoscopy. Completed 05/24/2018. Repeat every 10 years  Mammogram status: Completed 11/18/2022. Repeat every year  DEXA Scan: not applicable   Lung Cancer Screening: (Low Dose CT Chest recommended if Age 66-80 years, 30 pack-year currently smoking OR have quit w/in 15years.) does not qualify.   Lung Cancer Screening Referral: Not applicable   Additional Screening:  Hepatitis C Screening: does qualify; Completed 09/10/2016  Vision Screening: Recommended annual ophthalmology exams for early detection of glaucoma and other disorders of the eye. Is the patient up to date with their annual eye exam?  Yes  Who is the provider or what is the name of the office in which the patient attends annual eye exams?  [facility]  If pt is not established with a provider, would they like to be referred to a provider to establish care? No .   Dental Screening: Recommended annual dental exams for proper oral hygiene  Community Resource Referral / Chronic Care Management: CRR required this visit?  No   CCM required this visit?  No      Plan:     I have personally reviewed and noted the following in the patient's chart:   Medical and social history Use of alcohol, tobacco or illicit drugs  Current medications and supplements including opioid prescriptions. Patient is currently taking opioid prescriptions. Information provided to patient regarding non-opioid alternatives. Patient advised to discuss non-opioid treatment plan with their provider. Functional ability and status Nutritional status Physical activity Advanced directives List of other physicians Hospitalizations, surgeries, and ER visits in previous 12 months Vitals Screenings to include cognitive, depression, and falls Referrals and appointments  In addition, I have reviewed and discussed with patient certain preventive protocols, quality metrics, and best practice recommendations. A written personalized care plan for preventive services as well as general preventive health recommendations were provided to patient.    Ms. Pliska , Thank you for taking time to come for your Medicare Wellness Visit. I appreciate your ongoing commitment to your health goals. Please review the following plan we discussed and let me know if I can assist you in the future.   These are the goals we discussed:  Goals      Exercise 3x per week (30 min per time)     Patient is currently walking 82mns ~2x a week. 11/2019        This is a list of the screening recommended for you and due dates:  Health Maintenance  Topic Date Due   Complete foot exam   06/27/2021   COVID-19 Vaccine (6 - 2023-24 season) 07/10/2022   Eye exam for diabetics   01/30/2023   Hemoglobin A1C  02/19/2023   Yearly kidney health urinalysis for diabetes  08/21/2023   Yearly kidney function blood test for diabetes  09/05/2023   Medicare Annual Wellness Visit  12/29/2023   Pap Smear  08/13/2024   Mammogram  11/18/2024   DTaP/Tdap/Td vaccine (3 - Td or Tdap) 09/10/2026   Colon Cancer Screening  05/24/2028   Flu Shot  Completed   Hepatitis C Screening: USPSTF Recommendation to screen - Ages 18-79 yo.  Completed   HIV Screening  Completed   Zoster (Shingles) Vaccine  Completed   HPV Vaccine  Aged Out  Ms. Varnadore , Thank you for taking time to come for your Medicare Wellness Visit. I appreciate your ongoing commitment to your health goals. Please review the following plan we discussed and let me know if I can assist you in the future.   These are the goals we discussed:  Goals      Exercise 3x per week (30 min per time)     Patient is currently walking 4mns ~2x a week. 11/2019        This is a list of the screening recommended for you and due dates:  Health Maintenance  Topic Date Due   Complete foot exam   06/27/2021   COVID-19 Vaccine (6 - 2023-24 season) 07/10/2022   Eye exam for diabetics  01/30/2023   Hemoglobin A1C  02/19/2023   Yearly kidney health urinalysis for diabetes  08/21/2023   Yearly kidney function blood test for diabetes  09/05/2023   Medicare Annual Wellness Visit  12/29/2023   Pap Smear  08/13/2024   Mammogram  11/18/2024   DTaP/Tdap/Td vaccine (3 - Td or Tdap) 09/10/2026   Colon Cancer Screening  05/24/2028   Flu Shot  Completed   Hepatitis C Screening: USPSTF Recommendation to screen - Ages 18-79 yo.  Completed   HIV Screening  Completed   Zoster (Shingles) Vaccine  Completed   HPV Vaccine  Aged [address] [place]  12/28/2022  Nurse Notes: Approximately 30 minute Non-Face -To-Face Medicare Wellness Visit

## 2022-12-27 NOTE — Patient Instructions (Signed)

## 2022-12-28 ENCOUNTER — Ambulatory Visit (INDEPENDENT_AMBULATORY_CARE_PROVIDER_SITE_OTHER): Payer: 59

## 2022-12-28 DIAGNOSIS — Z0000 Encounter for general adult medical examination without abnormal findings: Secondary | ICD-10-CM

## 2022-12-29 ENCOUNTER — Ambulatory Visit: Payer: 59 | Admitting: Family Medicine

## 2023-01-01 ENCOUNTER — Encounter (HOSPITAL_COMMUNITY): Payer: Self-pay | Admitting: Emergency Medicine

## 2023-01-01 ENCOUNTER — Emergency Department (HOSPITAL_COMMUNITY): Payer: 59

## 2023-01-01 ENCOUNTER — Other Ambulatory Visit: Payer: Self-pay

## 2023-01-01 ENCOUNTER — Emergency Department (HOSPITAL_COMMUNITY)
Admission: EM | Admit: 2023-01-01 | Discharge: 2023-01-02 | Disposition: A | Discharge status: Home / Self Care | Payer: 59 | Attending: Emergency Medicine | Admitting: Emergency Medicine

## 2023-01-01 DIAGNOSIS — M545 Low back pain, unspecified: Secondary | ICD-10-CM | POA: Diagnosis not present

## 2023-01-01 DIAGNOSIS — M542 Cervicalgia: Secondary | ICD-10-CM | POA: Diagnosis present

## 2023-01-01 DIAGNOSIS — Z7982 Long term (current) use of aspirin: Secondary | ICD-10-CM | POA: Insufficient documentation

## 2023-01-01 DIAGNOSIS — E1122 Type 2 diabetes mellitus with diabetic chronic kidney disease: Secondary | ICD-10-CM | POA: Diagnosis not present

## 2023-01-01 DIAGNOSIS — N189 Chronic kidney disease, unspecified: Secondary | ICD-10-CM | POA: Diagnosis not present

## 2023-01-01 DIAGNOSIS — M62838 Other muscle spasm: Secondary | ICD-10-CM

## 2023-01-01 DIAGNOSIS — Z79899 Other long term (current) drug therapy: Secondary | ICD-10-CM | POA: Insufficient documentation

## 2023-01-01 DIAGNOSIS — R079 Chest pain, unspecified: Secondary | ICD-10-CM | POA: Diagnosis not present

## 2023-01-01 DIAGNOSIS — I10 Essential (primary) hypertension: Secondary | ICD-10-CM | POA: Diagnosis not present

## 2023-01-01 DIAGNOSIS — I129 Hypertensive chronic kidney disease with stage 1 through stage 4 chronic kidney disease, or unspecified chronic kidney disease: Secondary | ICD-10-CM | POA: Insufficient documentation

## 2023-01-01 MED ORDER — TIZANIDINE HCL 4 MG PO TABS
2.0000 mg | ORAL_TABLET | Freq: Once | ORAL | Status: AC
  Administered 2023-01-02: 2 mg via ORAL
  Filled 2023-01-01: qty 1

## 2023-01-01 NOTE — ED Notes (Signed)
Pt transport to XR

## 2023-01-01 NOTE — ED Provider Notes (Signed)
[facility] Provider Note   CSN: [id number] Arrival date & time: 01/01/23  2221     History {Add pertinent medical, surgical, social history, OB history to HPI:1} Chief Complaint  Patient presents with   Neck Pain    Kathleen Larson is a 57 y.o. female.  The history is provided by the patient and medical records.  Neck Pain Kathleen Larson is a 57 y.o. female who presents to the Emergency Department complaining of neck pain.  She presents to the emergency department complaining of 2 weeks of left-sided posterior neck pain located between the neck and the shoulder.  Pain is described as throbbing and tingling at the same time.  Pain is constant but worse when she moves her neck.  No associated fever, shortness of breath, chest pain, difficulty swallowing.  No difficulty with using her arm.  No known injuries.  She does state that today she started to feel headache secondary to the pain.  She has taken Tylenol and baclofen without relief for her symptoms.  She has a history of CVA in 2017 with persistent right-sided deficits, diabetes, hypertension, CKD.      Home Medications Prior to Admission medications   Medication Sig Start Date End Date Taking? Authorizing Provider  amLODipine (NORVASC) 5 MG tablet Take 1 tablet (5 mg total) by mouth at bedtime. 09/04/22   Ezequiel Essex, MD  aspirin 325 MG tablet Take 1 tablet (325 mg total) by mouth daily. 08/15/18   Kathrene Alu, MD  atorvastatin (LIPITOR) 80 MG tablet Take 1 tablet (80 mg total) by mouth daily. 03/24/22   Ezequiel Essex, MD  baclofen (LIORESAL) 10 MG tablet Take 1 tablet (10 mg total) by mouth 3 (three) times daily. 08/31/22   Ward, Lenise Arena, PA-C  Blood Glucose Monitoring Suppl (ONETOUCH VERIO) w/Device KIT Please use to check blood sugars, twice daily. E11.69 03/19/20   Kathrene Alu, MD  Blood Pressure KIT Take your blood pressure one a day or once every other  day. Use it while sitting down with your feet on the floor and your back resting in the chair. 10/17/21   Ezequiel Essex, MD  clotrimazole (LOTRIMIN) 1 % cream Apply 1 Application topically 2 (two) times daily. 09/04/22   Ezequiel Essex, MD  dapagliflozin propanediol (FARXIGA) 10 MG TABS tablet Take 1 tablet (10 mg total) by mouth daily. 09/04/22   Ezequiel Essex, MD  famotidine (PEPCID) 20 MG tablet Take 1 tablet (20 mg total) by mouth 2 (two) times daily. 09/04/22   Ezequiel Essex, MD  glucose blood [facility] VERIO) test strip Please use to check blood sugars, twice daily. E11.69 09/04/22   Ezequiel Essex, MD  Lancet Devices (ONE TOUCH DELICA LANCING DEV) MISC Please use to check blood sugars, twice daily. E11.69 09/04/22   Ezequiel Essex, MD  losartan-hydrochlorothiazide [facility]) 50-12.5 MG tablet Take 1 tablet by mouth once daily 11/23/22   Ezequiel Essex, MD  sertraline (ZOLOFT) 50 MG tablet Take 1 tablet (50 mg total) by mouth daily. 09/04/22   Ezequiel Essex, MD  tirzepatide [facility]) 5 MG/0.5ML Pen Inject 5 mg into the skin once a week. 02/10/22   Ezequiel Essex, MD  traMADol (ULTRAM) 50 MG tablet Take 1 tablet (50 mg total) by mouth every 6 (six) hours as needed. 09/04/22   Ezequiel Essex, MD      Allergies    Shrimp [shellfish allergy]    Review of Systems   Review of Systems  Musculoskeletal:  Positive for neck pain.  All other systems reviewed and are negative.   Physical Exam Updated Vital Signs BP (!) 172/115 (BP Location: Left Arm)   Pulse 88   Temp (!) 97.5 F (36.4 C) (Oral)   Resp 18   LMP 10/14/2012   SpO2 100%  Physical Exam Vitals and nursing note reviewed.  Constitutional:      Appearance: She is well-developed.  HENT:     Head: Normocephalic and atraumatic.  Cardiovascular:     Rate and Rhythm: Normal rate and regular rhythm.     Heart sounds: No murmur heard. Pulmonary:     Effort: Pulmonary effort is normal. No respiratory distress.     Breath  sounds: Normal breath sounds.  Abdominal:     Palpations: Abdomen is soft.     Tenderness: There is no abdominal tenderness. There is no guarding or rebound.  Musculoskeletal:        General: No tenderness.     Comments: 2+ radial pulses bilaterally.  No tenderness to palpation over left trapezius, but this is the region of her pain.   Skin:    General: Skin is warm and dry.  Neurological:     Mental Status: She is alert and oriented to person, place, and time.     Comments: Mild RUE/RLE weakness (pt states baseline).  5/5 strength in LUE/LLE.    Psychiatric:        Behavior: Behavior normal.     ED Results / Procedures / Treatments   Labs (all labs ordered are listed, but only abnormal results are displayed) Labs Reviewed - No data to display  EKG None  Radiology No results found.  Procedures Procedures  {Document cardiac monitor, telemetry assessment procedure when appropriate:1}  Medications Ordered in ED Medications - No data to display  ED Course/ Medical Decision Making/ A&P   {   Click here for ABCD2, HEART and other calculatorsREFRESH Note before signing :1}                          Medical Decision Making Amount and/or Complexity of Data Reviewed Radiology: ordered.  Risk Prescription drug management.   ***  {Document critical care time when appropriate:1} {Document review of labs and clinical decision tools ie heart score, Chads2Vasc2 etc:1}  {Document your independent review of radiology images, and any outside records:1} {Document your discussion with family members, caretakers, and with consultants:1} {Document social determinants of health affecting pt's care:1} {Document your decision making why or why not admission, treatments were needed:1} Final Clinical Impression(s) / ED Diagnoses Final diagnoses:  None    Rx / DC Orders ED Discharge Orders     None

## 2023-01-01 NOTE — ED Triage Notes (Signed)
Pt with lower back pain initially that she states about 2 week ago "moved to her neck"  Pain localized to left neck and worse with turning her head in that direction.  Has tried muscle relaxers and tylenol without relief.

## 2023-01-02 DIAGNOSIS — R079 Chest pain, unspecified: Secondary | ICD-10-CM | POA: Diagnosis not present

## 2023-01-02 DIAGNOSIS — M545 Low back pain, unspecified: Secondary | ICD-10-CM | POA: Diagnosis not present

## 2023-01-02 MED ORDER — TIZANIDINE HCL 2 MG PO TABS: 2.0000 mg | ORAL_TABLET | Freq: Four times a day (QID) | ORAL | 0 refills | Status: DC | PRN

## 2023-01-08 ENCOUNTER — Telehealth: Payer: Self-pay

## 2023-01-08 NOTE — Telephone Encounter (Signed)
        Patient  visited [facility] on 01/02/2023  for neck pain.   Telephone encounter attempt :  1st  A HIPAA compliant voice message was left requesting a return call.  Instructed patient to call back at [phone].   [place] Resource Care Guide   [email or web address]  ?? [id number]   [email or web address]: [email or web address]  [email or web address]

## 2023-01-11 ENCOUNTER — Telehealth: Payer: Self-pay

## 2023-01-11 NOTE — Telephone Encounter (Signed)
     Patient  visit on 01/02/2023  at [facility] was for neck pain.  Have you been able to follow up with your primary care physician? Patient has appointment 01/12/2023.  The patient was or was not able to obtain any needed medicine or equipment. Patient was able to obtain medication.  Are there diet recommendations that you are having difficulty following? No  Patient expresses understanding of discharge instructions and education provided has no other needs at this time. Yes   [facility] Care Guide   [email or web address]  ?? [id number]   Website: [email or web address]

## 2023-01-12 ENCOUNTER — Ambulatory Visit (INDEPENDENT_AMBULATORY_CARE_PROVIDER_SITE_OTHER): Payer: 59 | Admitting: Family Medicine

## 2023-01-12 ENCOUNTER — Encounter: Payer: Self-pay | Admitting: Family Medicine

## 2023-01-12 VITALS — BP 128/82 | HR 77 | Ht 64.0 in | Wt 227.6 lb

## 2023-01-12 DIAGNOSIS — E1140 Type 2 diabetes mellitus with diabetic neuropathy, unspecified: Secondary | ICD-10-CM

## 2023-01-12 DIAGNOSIS — T148XXA Other injury of unspecified body region, initial encounter: Secondary | ICD-10-CM | POA: Diagnosis not present

## 2023-01-12 LAB — POCT GLYCOSYLATED HEMOGLOBIN (HGB A1C): HbA1c, POC (controlled diabetic range): 6.4 % (ref 0.0–7.0)

## 2023-01-12 NOTE — Patient Instructions (Signed)
It was wonderful to see you today. Thank you for allowing me to be a part of your care. Below is a short summary of what we discussed at your visit today:  Back and neck pain Because of your kidney values, we cannot use NSAIDs like Aleve, ibuprofen, naproxen, or meloxicam.  Continue ice, heat, massage, and gentle stretching. Will get the back book provided for more tips on back pain.  I have referred you to physical therapy for this back and shoulder pain.  Someone from their office should be calling you in 1 to 2 weeks to schedule an appointment.  If you do not hear from them, let us know. We may need to nudge along the referral.    Diabetes Your A1c today continues to be excellent, it is 6.4.   6.4 is now in the prediabetic range. Last time it was 6.6.  Continue medications with no changes.  Keep up the good work!  High blood pressure Today blood pressure is well-controlled.  Continue your medications with no changes.   Please bring all of your medications to every appointment!  If you have any questions or concerns, please do not hesitate to contact us via phone or MyChart message.   Ezequiel Essex, MD
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
## 2023-01-13 LAB — BASIC METABOLIC PANEL
BUN/Creatinine Ratio: 19 (ref 9–23)
BUN: 18 mg/dL (ref 6–24)
CO2: 24 mmol/L (ref 20–29)
Calcium: 9.6 mg/dL (ref 8.7–10.2)
Chloride: 103 mmol/L (ref 96–106)
Creatinine, Ser: 0.94 mg/dL (ref 0.57–1.00)
Glucose: 108 mg/dL — ABNORMAL HIGH (ref 70–99)
Potassium: 3.7 mmol/L (ref 3.5–5.2)
Sodium: 144 mmol/L (ref 134–144)
eGFR: 70 mL/min/{1.73_m2} (ref >59)

## 2023-01-13 NOTE — Assessment & Plan Note (Signed)
A1c 6.4, improved from last check. Tolerating meds without adverse side effects. Will continue meds without change. Can space out A1c checks to every 6 months.
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
## 2023-01-14 ENCOUNTER — Encounter: Payer: Self-pay | Admitting: Family Medicine

## 2023-01-19 ENCOUNTER — Other Ambulatory Visit: Payer: Self-pay | Admitting: Family Medicine

## 2023-01-19 DIAGNOSIS — M25551 Pain in right hip: Secondary | ICD-10-CM

## 2023-01-19 DIAGNOSIS — M7061 Trochanteric bursitis, right hip: Secondary | ICD-10-CM
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
## 2023-01-29 ENCOUNTER — Ambulatory Visit: Payer: 59 | Attending: Family Medicine | Admitting: Physical Therapy

## 2023-01-29 ENCOUNTER — Other Ambulatory Visit: Payer: Self-pay

## 2023-01-29 ENCOUNTER — Encounter: Payer: Self-pay | Admitting: Physical Therapy

## 2023-01-29 DIAGNOSIS — M5459 Other low back pain: Secondary | ICD-10-CM | POA: Insufficient documentation

## 2023-01-29 DIAGNOSIS — T148XXA Other injury of unspecified body region, initial encounter: Secondary | ICD-10-CM | POA: Insufficient documentation

## 2023-01-29 DIAGNOSIS — M6281 Muscle weakness (generalized): Secondary | ICD-10-CM | POA: Diagnosis not present

## 2023-01-29 DIAGNOSIS — R293 Abnormal posture: Secondary | ICD-10-CM | POA: Insufficient documentation

## 2023-01-29 DIAGNOSIS — M542 Cervicalgia: Secondary | ICD-10-CM | POA: Insufficient documentation

## 2023-01-29 NOTE — Patient Instructions (Signed)
Access Code: [email or web address] URL: [email or web address] Date: 01/29/2023 Prepared by: Hilda Blades  Exercises - Seated Gentle Upper Trapezius Stretch  - 1-2 x daily - 7 x weekly - 3 reps - 15 seconds hold - Gentle Levator Scapulae Stretch  - 1-2 x daily - 7 x weekly - 3 reps - 15 seconds hold - Seated Scapular Retraction  - 1-2 x daily - 7 x weekly - 10 reps

## 2023-02-01 ENCOUNTER — Ambulatory Visit: Payer: 59 | Admitting: Physical Therapy

## 2023-02-01 ENCOUNTER — Encounter: Payer: Self-pay | Admitting: Physical Therapy

## 2023-02-01 ENCOUNTER — Other Ambulatory Visit: Payer: Self-pay

## 2023-02-01 DIAGNOSIS — R293 Abnormal posture: Secondary | ICD-10-CM

## 2023-02-01 DIAGNOSIS — M542 Cervicalgia: Secondary | ICD-10-CM | POA: Diagnosis not present

## 2023-02-01 DIAGNOSIS — M5459 Other low back pain: Secondary | ICD-10-CM

## 2023-02-01 DIAGNOSIS — M6281 Muscle weakness (generalized): Secondary | ICD-10-CM | POA: Diagnosis not present

## 2023-02-01 DIAGNOSIS — T148XXA Other injury of unspecified body region, initial encounter: Secondary | ICD-10-CM | POA: Diagnosis not present

## 2023-02-01 NOTE — Patient Instructions (Signed)
Access Code: [email or web address] URL: [email or web address] Date: 02/01/2023 Prepared by: Hilda Blades  Exercises - Sidelying Thoracic Lumbar Rotation  - 1-2 x daily - 10 reps - 5 seconds hold - Supine Lower Trunk Rotation  - 1-2 x daily - 10 reps - 5 seconds hold - Seated Gentle Upper Trapezius Stretch  - 1-2 x daily - 3 reps - 15 seconds hold - Gentle Levator Scapulae Stretch  - 1-2 x daily - 3 reps - 15 seconds hold - Seated Scapular Retraction  - 1-2 x daily - 10 reps

## 2023-02-01 NOTE — Therapy (Signed)
OUTPATIENT PHYSICAL THERAPY TREATMENT NOTE   Patient Name: Kathleen Larson MRN: [id number] DOB:06/24/1966, 57 y.o., female Today's Date: 02/01/2023  PCP: Ezequiel Essex, MD REFERRING PROVIDER: Kinnie Feil, MD   END OF SESSION:   PT End of Session - 02/01/23 1618     Visit Number 2    Number of Visits 13    Date for PT Re-Evaluation 03/12/23    Authorization Type [employer] MCR    Progress Note Due on Visit 10    PT Start Time [id number]    PT Stop Time 1610    PT Time Calculation (min) 40 min    Activity Tolerance Patient tolerated treatment well    Behavior During Therapy [facility] for tasks assessed/performed             Past Medical History:  Diagnosis Date   Abnormal EKG    Allergy    Back pain 10/16/2021   CONSTIPATION 03/22/2009   Qualifier: Diagnosis of  By: Hassell Done FNP, Nykedtra     Diabetes mellitus without complication ([facility])    Epiploic appendagitis 09/26/2018   Esophageal dysphagia 03/10/2018   Essential hypertension    GERD (gastroesophageal reflux disease)    Greater trochanteric bursitis of right hip 05/26/2017   Grief 10/16/2021   Healthcare maintenance [id number]   HELICOBACTER PYLORI INFECTION, HX OF 01/28/2009   Qualifier: Diagnosis of  By: Hassell Done FNP, Nykedtra     Hyperlipidemia    Hypertension    Knee buckling, left 09/19/2020   Left knee pain 10/18/2020   Lesion of tongue 10/22/2017   LGSIL on Pap smear of cervix 08/14/2019   Medical non-compliance    Mild cognitive impairment 12/27/2017   Neck muscle strain 09/10/2016   Stroke ([place])    CVA 2017   Type 2 diabetes mellitus with other specified complication ([place]) [id number]   Complication is CVA   Yeast dermatitis 03/01/2019   Past Surgical History:  Procedure Laterality Date   TUBAL LIGATION     Patient Active Problem List   Diagnosis Date Noted   Elevated serum creatinine 01/14/2021   Vitreous floaters of both eyes 12/21/2020   Poor dentition 12/21/2020   Boils of multiple sites 12/04/2020    Type 2 diabetes mellitus with diabetic neuropathy, unspecified ([place]) 03/15/2020   At risk for falls 03/14/2020   Major depressive disorder, single episode, severe ([place]) 03/10/2018   Mixed conductive and sensorineural hearing loss of both ears 10/22/2017   Tinnitus of both ears 06/21/2017   Gait disorder 03/25/2017   Muscle strain 09/10/2016   Essential hypertension, benign 05/27/2016   Intracranial vascular stenosis    History of stroke    Obesity    ALLERGIC RHINITIS 03/02/2008   Hyperlipemia, secondary prevention (LDL <70) 07/18/2007    REFERRING DIAG: Muscle strain   THERAPY DIAG:  Cervicalgia  Other low back pain  Muscle weakness (generalized)  Abnormal posture  Rationale for Evaluation and Treatment Rehabilitation  PERTINENT HISTORY: History of CVA in 2017 affecting right side   PRECAUTIONS: None    SUBJECTIVE:  SUBJECTIVE STATEMENT:  Patient reports she is feeling a little better. She felt pretty good following the needling last visit.   PAIN:  Are you having pain? Yes:  NPRS scale: 8/10 Pain location: Neck, left Pain description: "it hurts" Aggravating factors: Constant pain, sleeping, turning to left, looking down Relieving factors: Muscle relaxer and pain medication, Salonpas   NPRS scale: 6-7/10 Pain location: Lower back Pain description: Sharp Aggravating factors: Sitting or standing long periods Relieving factors: Walking for a few minutes   OBJECTIVE: (objective measures completed at initial evaluation unless otherwise dated) PATIENT SURVEYS:  FOTO 54% functional status   POSTURE:             Rounded and elevated shoulders, forward head, kyphotic thoracic    PALPATION: Tender to palpation with increased muscle tension left upper trap        CERVICAL ROM:    Active  ROM A/PROM (deg) eval   02/01/2023  Flexion 20   Extension 25   Right lateral flexion 20   Left lateral flexion 15   Right rotation 70   Left rotation 40 55   (Blank rows = not tested)   LUMBAR ROM:    Active  A/PROM  eval  Flexion 75%  Extension 25%  Right lateral flexion    Left lateral flexion    Right rotation 50%  Left rotation 50%   (Blank rows = not tested)    UPPER EXTREMITY MMT:   MMT Right eval Left eval  Shoulder flexion 4 5  Shoulder extension      Shoulder abduction 4 5  Shoulder adduction      Shoulder extension      Shoulder internal rotation      Shoulder external rotation 4 4+  Middle trapezius      Lower trapezius      Elbow flexion      Elbow extension      Wrist flexion      Wrist extension      Wrist ulnar deviation      Wrist radial deviation      Wrist pronation      Wrist supination      Grip strength       (Blank rows = not tested)   LOWER EXTREMITY MMT:     MMT Right eval Left eval  Hip flexion      Hip extension      Hip abduction      Hip adduction      Hip internal rotation      Hip external rotation      Knee flexion      Knee extension      Ankle dorsiflexion      Ankle plantarflexion      Ankle inversion      Ankle eversion       (Blank rows = not tested)    CERVICAL / LUMBAR SPECIAL TESTS:  Cervical radicular testing negative   FUNCTIONAL TESTS:  Not assessed     TODAY'S TREATMENT:       [facility] Adult PT Treatment:                                                DATE: 02/01/2023 Therapeutic Exercise: NuStep L5 x 5 min with UE/LE while taking subjective Sidelying thoracic rotation x 10 each Supine LTR  x 10 Supine horizontal abduction with red 2 x 10 SLR 2 x 10 Bridge 2 x 10 Supine clamshell with red 2 x 15 Upper trap and levator scap stretch x 15 sec each Seated double er and scap retraction with red 2 x 10 Row with green 2 x 10 Extension with green x 10   [facility] Adult PT Treatment:                                                 DATE: 01/29/2023 Therapeutic Exercise: Upper trap and levator scap stretch Seated shoulder blade squeezes Manual: Skilled palpation and monitoring of muscle tension while performing TPDN STM left upper trap region Trigger Point Dry Needling Treatment: Pre-treatment instruction: Patient instructed on dry needling rationale, procedures, and possible side effects including pain during treatment (achy,cramping feeling), bruising, drop of blood, lightheadedness, nausea, sweating. Patient Consent Given: Yes Education handout provided: No Muscles treated: Left upper trap  Needle size and number: .30x27mm x 3 Electrical stimulation performed: No Parameters: N/A Treatment response/outcome: Twitch response elicited and Palpable decrease in muscle tension Post-treatment instructions: Patient instructed to expect possible mild to moderate muscle soreness later today and/or tomorrow. Patient instructed in methods to reduce muscle soreness and to continue prescribed HEP. If patient was dry needled over the lung field, patient was instructed on signs and symptoms of pneumothorax and, however unlikely, to see immediate medical attention should they occur. Patient was also educated on signs and symptoms of infection and to seek medical attention should they occur. Patient verbalized understanding of these instructions and education.     PATIENT EDUCATION:  Education details: HEP update Person educated: Patient Education method: Explanation, Demonstration, Tactile cues, Verbal cues, Handout Education comprehension: verbalized understanding, returned demonstration, verbal cues required, tactile cues required, and needs further education   HOME EXERCISE PROGRAM: Access Code: [email or web address]      ASSESSMENT: CLINICAL IMPRESSION: Patient tolerated therapy well with no adverse effects. Therapy focused on progressing spinal mobility and core and postural control. She reports improvement in  her neck and back pain this visit following the dry needling. She did elect to hold off on dry needling this visit due to soreness. She was able to tolerate resistance for postural strengthening and demonstrates improved cervical rotation to the left compared to previous visit. Updated her HEP to progress stretching. Patient would benefit from continued skilled PT to progress her mobility and strength in order to reduce pain and maximize functional ability.     OBJECTIVE IMPAIRMENTS: decreased activity tolerance, decreased ROM, decreased strength, impaired flexibility, postural dysfunction, and pain.    ACTIVITY LIMITATIONS: carrying, lifting, sitting, standing, sleeping, dressing, and hygiene/grooming   PARTICIPATION LIMITATIONS: cleaning and driving   PERSONAL FACTORS: Fitness, Past/current experiences, and Time since onset of injury/illness/exacerbation are also affecting patient's functional outcome.      GOALS: Goals reviewed with patient? Yes   SHORT TERM GOALS: Target date: 02/19/2023   Patient will be I with initial HEP in order to progress with therapy. Baseline: HEP provided at eval Goal status: INITIAL   2.  PT will review FOTO with patient by 3rd visit in order to understand expected progress and outcome with therapy. Baseline: FOTO assessed at eval Goal status: INITIAL   3.  Patient will report neck pain </= 6/10 in order to reduce functional limitations Baseline: 10/10 pain Goal status:  INITIAL   LONG TERM GOALS: Target date: 03/12/2023   Patient will be I with final HEP to maintain progress from PT. Baseline: HEP provided at eval Goal status: INITIAL   2.  Patient will report >/= 64% status on FOTO to indicate improved functional ability. Baseline: 54% functional status Goal status: INITIAL   3.  Patient will demonstrate left cervical rotation >/= 60 deg in order to improve driving ability and indicate reduced left sided muscle tension Baseline: 40 deg Goal  status: INITIAL   4.  Patient will report improved sleep without being disturbed due to left sided neck pain. Baseline: patient reports poor sleep, frequently woken and difficulty getting comfortable due to left sided neck pain Goal status: INITIAL     PLAN: PT FREQUENCY: 1-2x/week   PT DURATION: 6 weeks   PLANNED INTERVENTIONS: Therapeutic exercises, Therapeutic activity, Neuromuscular re-education, Balance training, Gait training, Patient/Family education, Self Care, Joint mobilization, Joint manipulation, Aquatic Therapy, Dry Needling, Electrical stimulation, Spinal manipulation, Spinal mobilization, Cryotherapy, Moist heat, Taping, Manual therapy, and Re-evaluation   PLAN FOR NEXT SESSION: Review HEP and progress PRN, manual/dry needling for left upper trap region and neck stretching, progress postural control and strengthening as tolerated   Hilda Blades, PT, DPT, LAT, ATC 02/01/23  4:19 PM Phone: [phone] Fax: [phone]

## 2023-02-03 ENCOUNTER — Other Ambulatory Visit: Payer: Self-pay | Admitting: Family Medicine

## 2023-02-03 DIAGNOSIS — E785 Hyperlipidemia, unspecified: Secondary | ICD-10-CM

## 2023-02-03 DIAGNOSIS — E1140 Type 2 diabetes mellitus with diabetic neuropathy, unspecified: Secondary | ICD-10-CM

## 2023-02-09 ENCOUNTER — Other Ambulatory Visit: Payer: Self-pay

## 2023-02-09 ENCOUNTER — Encounter: Payer: Self-pay | Admitting: Family Medicine

## 2023-02-09 DIAGNOSIS — M7061 Trochanteric bursitis, right hip: Secondary | ICD-10-CM

## 2023-02-09 DIAGNOSIS — M25551 Pain in right hip: Secondary | ICD-10-CM

## 2023-02-09 NOTE — Telephone Encounter (Signed)
Will refuse both medications. Tramadol was intended as short course.Given this is acquired muscle strain and chronic low back pain, would not want to continue tramadol. As discussed previously, recommend she continue heating pad and stretching. Continue with PT.   Okay to take OTC meds such as tylenol, aleve, lidocaine patches.   Ezequiel Essex, MD

## 2023-02-09 NOTE — Telephone Encounter (Signed)
Patient LVM on nurse line requesting refills for Tramadol and Naproxen.   I do not see Naproxen on her current medication list.   Will forward to PCP.

## 2023-02-11 NOTE — Therapy (Signed)
OUTPATIENT PHYSICAL THERAPY TREATMENT NOTE   Patient Name: Kathleen Larson MRN: [id number] DOB:06/16/1966, 57 y.o., female Today's Date: 02/13/2023  PCP: Fayette Pho, MD REFERRING PROVIDER: Doreene Eland, MD   END OF SESSION:   PT End of Session - 02/13/23 0926     Visit Number 3    Number of Visits 13    Date for PT Re-Evaluation 03/12/23    Authorization Type U[employer] MCR    Progress Note Due on Visit 10    PT Start Time 0925    PT Stop Time 1010    PT Time Calculation (min) 45 min    Activity Tolerance Patient tolerated treatment well    Behavior During Therapy [facility] for tasks assessed/performed              Past Medical History:  Diagnosis Date   Abnormal EKG    Allergy    Back pain 10/16/2021   CONSTIPATION 03/22/2009   Qualifier: Diagnosis of  By: Daphine Deutscher FNP, Nykedtra     Diabetes mellitus without complication    Epiploic appendagitis 09/26/2018   Esophageal dysphagia 03/10/2018   Essential hypertension    GERD (gastroesophageal reflux disease)    Greater trochanteric bursitis of right hip 05/26/2017   Grief 10/16/2021   Healthcare maintenance 08/14/2019   HELICOBACTER PYLORI INFECTION, HX OF 01/28/2009   Qualifier: Diagnosis of  By: Daphine Deutscher FNP, Nykedtra     Hyperlipidemia    Hypertension    Knee buckling, left 09/19/2020   Left knee pain 10/18/2020   Lesion of tongue 10/22/2017   LGSIL on Pap smear of cervix 08/14/2019   Medical non-compliance    Mild cognitive impairment 12/27/2017   Neck muscle strain 09/10/2016   Stroke    CVA 2017   Type 2 diabetes mellitus with other specified complication 06/21/2017   Complication is CVA   Yeast dermatitis 03/01/2019   Past Surgical History:  Procedure Laterality Date   TUBAL LIGATION     Patient Active Problem List   Diagnosis Date Noted   Elevated serum creatinine 01/14/2021   Vitreous floaters of both eyes 12/21/2020   Poor dentition 12/21/2020   Boils of multiple sites 12/04/2020   Type 2 diabetes  mellitus with diabetic neuropathy, unspecified 03/15/2020   At risk for falls 03/14/2020   Major depressive disorder, single episode, severe 03/10/2018   Mixed conductive and sensorineural hearing loss of both ears 10/22/2017   Tinnitus of both ears 06/21/2017   Gait disorder 03/25/2017   Muscle strain 09/10/2016   Essential hypertension, benign 05/27/2016   Intracranial vascular stenosis    History of stroke    Obesity    ALLERGIC RHINITIS 03/02/2008   Hyperlipemia, secondary prevention (LDL <70) 07/18/2007    REFERRING DIAG: Muscle strain   THERAPY DIAG:  Cervicalgia  Other low back pain  Muscle weakness (generalized)  Abnormal posture  Rationale for Evaluation and Treatment Rehabilitation  PERTINENT HISTORY: History of CVA in 2017 affecting right side   PRECAUTIONS: None    SUBJECTIVE:  SUBJECTIVE STATEMENT:  Patient reports she is doing alright. States her neck is doing pretty good and exercises are helping at home.   PAIN:  Are you having pain? Yes:  NPRS scale: 7/10 Pain location: Neck, left Pain description: "it hurts" Aggravating factors: Constant pain, sleeping, turning to left, looking down Relieving factors: Muscle relaxer and pain medication, Salonpas   NPRS scale: 5/10 Pain location: Lower back Pain description: Sharp Aggravating factors: Sitting or standing long periods Relieving factors: Walking for a few minutes   OBJECTIVE: (objective measures completed at initial evaluation unless otherwise dated) PATIENT SURVEYS:  FOTO 54% functional status   POSTURE:             Rounded and elevated shoulders, forward head, kyphotic thoracic    PALPATION: Tender to palpation with increased muscle tension left upper trap        CERVICAL ROM:    Active ROM A/PROM (deg) eval    02/01/2023  Flexion 20   Extension 25   Right lateral flexion 20   Left lateral flexion 15   Right rotation 70   Left rotation 40 55   (Blank rows = not tested)   LUMBAR ROM:    Active  A/PROM  eval  Flexion 75%  Extension 25%  Right lateral flexion    Left lateral flexion    Right rotation 50%  Left rotation 50%   (Blank rows = not tested)    UPPER EXTREMITY MMT:   MMT Right eval Left eval  Shoulder flexion 4 5  Shoulder extension      Shoulder abduction 4 5  Shoulder adduction      Shoulder extension      Shoulder internal rotation      Shoulder external rotation 4 4+  Middle trapezius      Lower trapezius      Elbow flexion      Elbow extension      Wrist flexion      Wrist extension      Wrist ulnar deviation      Wrist radial deviation      Wrist pronation      Wrist supination      Grip strength       (Blank rows = not tested)   LOWER EXTREMITY MMT:     MMT Right eval Left eval  Hip flexion      Hip extension      Hip abduction      Hip adduction      Hip internal rotation      Hip external rotation      Knee flexion      Knee extension      Ankle dorsiflexion      Ankle plantarflexion      Ankle inversion      Ankle eversion       (Blank rows = not tested)    CERVICAL / LUMBAR SPECIAL TESTS:  Cervical radicular testing negative   FUNCTIONAL TESTS:  Not assessed     TODAY'S TREATMENT:       [facility] Adult PT Treatment:                                                DATE: 02/13/2023 Therapeutic Exercise: UBE L1 x 4 min (fwd/bwd) while taking subjective Corner pec stretch 3 x 15 sec  Sidelying thoracic rotation x 10 each Supine LTR x 10 Bridge 2 x 10 Supine clamshell with green 2 x 15 Supine horizontal abduction with green 2 x 15 Seated upper trap and levator scap stretch x 15 sec each Seated thoracic extension over chair x 10 Seated double ER and scap retraction with green 2 x 10 Row with green 2 x 15 Extension with green x  10 Manual: Skilled palpation and monitoring of muscle tension while performing TPDN STM left upper trap region Trigger Point Dry Needling Treatment: Pre-treatment instruction: Patient instructed on dry needling rationale, procedures, and possible side effects including pain during treatment (achy,cramping feeling), bruising, drop of blood, lightheadedness, nausea, sweating. Patient Consent Given: Yes Education handout provided: No Muscles treated: Left upper trap  Needle size and number: .30x5650mm x 3 Electrical stimulation performed: No Parameters: N/A Treatment response/outcome: Twitch response elicited and Palpable decrease in muscle tension Post-treatment instructions: Patient instructed to expect possible mild to moderate muscle soreness later today and/or tomorrow. Patient instructed in methods to reduce muscle soreness and to continue prescribed HEP. If patient was dry needled over the lung field, patient was instructed on signs and symptoms of pneumothorax and, however unlikely, to see immediate medical attention should they occur. Patient was also educated on signs and symptoms of infection and to seek medical attention should they occur. Patient verbalized understanding of these instructions and education.   [facility]                                                [facility]: 02[facility]01/2023 Therapeutic Exercise: NuStep L5 x 5 min with UE/LE while taking subjective Sidelying thoracic rotation x 10 each Supine LTR x 10 Supine horizontal abduction with red 2 x 10 SLR 2 x 10 Bridge 2 x 10 Supine clamshell with red 2 x 15 Upper trap and levator scap stretch x 15 sec each Seated double er and scap retraction with red 2 x 10 Row with green 2 x 10 Extension with green x 10  [facility] Adult PT Treatment:                                                DATE: 01/29/2023 Therapeutic Exercise: Upper trap and levator scap stretch Seated shoulder blade squeezes Manual: Skilled palpation and  monitoring of muscle tension while performing TPDN STM left upper trap region Trigger Point Dry Needling Treatment: Pre-treatment instruction: Patient instructed on dry needling rationale, procedures, and possible side effects including pain during treatment (achy,cramping feeling), bruising, drop of blood, lightheadedness, nausea, sweating. Patient Consent Given: Yes Education handout provided: No Muscles treated: Left upper trap  Needle size and number: .30x1650mm x 3 Electrical stimulation performed: No Parameters: N/A Treatment response/outcome: Twitch response elicited and Palpable decrease in muscle tension Post-treatment instructions: Patient instructed to expect possible mild to moderate muscle soreness later today and/or tomorrow. Patient instructed in methods to reduce muscle soreness and to continue prescribed HEP. If patient was dry needled over the lung field, patient was instructed on signs and symptoms of pneumothorax and, however unlikely, to see immediate medical attention should they occur. Patient was also educated on signs and symptoms of infection and to seek medical attention should they occur. Patient  verbalized understanding of these instructions and education.     PATIENT EDUCATION:  Education details: HEP update, TPDN Person educated: Patient Education method: Explanation, Demonstration, Tactile cues, Verbal cues, Handout Education comprehension: verbalized understanding, returned demonstration, verbal cues required, tactile cues required, and needs further education   HOME EXERCISE PROGRAM: Access Code: [email or web address]      ASSESSMENT: CLINICAL IMPRESSION: Patient tolerated therapy well with no adverse effects. She reports continued improvement in her neck and back pain. Therapy continued to use TPDN for the left upper trap region with good therapeutic benefit. Continued progression of spinal mobility and postural exercises with good tolerance. She was able to increase  resistance for periscapular strengthening and updated HEP to progress stretching and strengthening for home. Patient would benefit from continued skilled PT to progress her mobility and strength in order to reduce pain and maximize functional ability.     OBJECTIVE IMPAIRMENTS: decreased activity tolerance, decreased ROM, decreased strength, impaired flexibility, postural dysfunction, and pain.    ACTIVITY LIMITATIONS: carrying, lifting, sitting, standing, sleeping, dressing, and hygiene/grooming   PARTICIPATION LIMITATIONS: cleaning and driving   PERSONAL FACTORS: Fitness, Past/current experiences, and Time since onset of injury/illness/exacerbation are also affecting patient's functional outcome.      GOALS: Goals reviewed with patient? Yes   SHORT TERM GOALS: Target date: 02/19/2023   Patient will be I with initial HEP in order to progress with therapy. Baseline: HEP provided at eval Goal status: INITIAL   2.  PT will review FOTO with patient by 3rd visit in order to understand expected progress and outcome with therapy. Baseline: FOTO assessed at eval Goal status: INITIAL   3.  Patient will report neck pain </= 6/10 in order to reduce functional limitations Baseline: 10/10 pain Goal status: INITIAL   LONG TERM GOALS: Target date: 03/12/2023   Patient will be I with final HEP to maintain progress from PT. Baseline: HEP provided at eval Goal status: INITIAL   2.  Patient will report >/= 64% status on FOTO to indicate improved functional ability. Baseline: 54% functional status Goal status: INITIAL   3.  Patient will demonstrate left cervical rotation >/= 60 deg in order to improve driving ability and indicate reduced left sided muscle tension Baseline: 40 deg Goal status: INITIAL   4.  Patient will report improved sleep without being disturbed due to left sided neck pain. Baseline: patient reports poor sleep, frequently woken and difficulty getting comfortable due to left  sided neck pain Goal status: INITIAL     PLAN: PT FREQUENCY: 1-2x/week   PT DURATION: 6 weeks   PLANNED INTERVENTIONS: Therapeutic exercises, Therapeutic activity, Neuromuscular re-education, Balance training, Gait training, Patient/Family education, Self Care, Joint mobilization, Joint manipulation, Aquatic Therapy, Dry Needling, Electrical stimulation, Spinal manipulation, Spinal mobilization, Cryotherapy, Moist heat, Taping, Manual therapy, and Re-evaluation   PLAN FOR NEXT SESSION: Review HEP and progress PRN, manual/dry needling for left upper trap region and neck stretching, progress postural control and strengthening as tolerated   Rosana Hoesampbell Fedor Kazmierski, PT, DPT, LAT, ATC 02/13/23  10:14 AM Phone: [phone] Fax: [phone]

## 2023-02-13 ENCOUNTER — Ambulatory Visit: Payer: 59 | Attending: Family Medicine | Admitting: Physical Therapy

## 2023-02-13 ENCOUNTER — Encounter: Payer: Self-pay | Admitting: Physical Therapy

## 2023-02-13 ENCOUNTER — Other Ambulatory Visit: Payer: Self-pay

## 2023-02-13 DIAGNOSIS — M542 Cervicalgia: Secondary | ICD-10-CM | POA: Diagnosis not present

## 2023-02-13 DIAGNOSIS — M6281 Muscle weakness (generalized): Secondary | ICD-10-CM | POA: Diagnosis not present

## 2023-02-13 DIAGNOSIS — M5459 Other low back pain: Secondary | ICD-10-CM | POA: Insufficient documentation

## 2023-02-13 DIAGNOSIS — R293 Abnormal posture: Secondary | ICD-10-CM | POA: Diagnosis not present

## 2023-02-13 NOTE — Patient Instructions (Signed)
Access Code: [email or web address] URL: [email or web address] Date: 02/13/2023 Prepared by: Rosana Hoes  Exercises - Sidelying Thoracic Lumbar Rotation  - 1-2 x daily - 10 reps - 5 seconds hold - Supine Lower Trunk Rotation  - 1-2 x daily - 10 reps - 5 seconds hold - Seated Gentle Upper Trapezius Stretch  - 1-2 x daily - 3 reps - 15 seconds hold - Gentle Levator Scapulae Stretch  - 1-2 x daily - 3 reps - 15 seconds hold - Corner Pec Major Stretch  - 1-2 x daily - 3 reps - 15 seconds hold - Standing Row with Anchored Resistance  - 1 x daily - 2 sets - 15 reps

## 2023-02-18 NOTE — Therapy (Signed)
OUTPATIENT PHYSICAL THERAPY TREATMENT NOTE   Patient Name: Kathleen Larson MRN: [id number] DOB:10/03/1966, 57 y.o., female Today's Date: 02/19/2023  PCP: Fayette Pho, MD REFERRING PROVIDER: Doreene Eland, MD   END OF SESSION:   PT End of Session - 02/19/23 0932     Visit Number 4    Number of Visits 13    Date for PT Re-Evaluation 03/12/23    Authorization Type [employer] MCR    Progress Note Due on Visit 10    PT Start Time 0932    PT Stop Time 1010    PT Time Calculation (min) 38 min    Activity Tolerance Patient tolerated treatment well    Behavior During Therapy [facility] for tasks assessed/performed               Past Medical History:  Diagnosis Date   Abnormal EKG    Allergy    Back pain 10/16/2021   CONSTIPATION 03/22/2009   Qualifier: Diagnosis of  By: Daphine Deutscher FNP, [place]kedtra     Diabetes mellitus without complication    Epiploic appendagitis 09/26/2018   Esophageal dysphagia 03/10/2018   Essential hypertension    GERD (gastroesophageal reflux disease)    Greater trochanteric bursitis of right hip 05/26/2017   Grief 10/16/2021   Healthcare maintenance 08/14/2019   HELICOBACTER PYLORI INFECTION, HX OF 01/28/2009   Qualifier: Diagnosis of  By: Daphine Deutscher FNP, Nykedtra     Hyperlipidemia    Hypertension    Knee buckling, left 09/19/2020   Left knee pain 10/18/2020   Lesion of tongue 10/22/2017   LGSIL on Pap smear of cervix 08/14/2019   Medical non-compliance    Mild cognitive impairment 12/27/2017   Neck muscle strain 09/10/2016   Stroke    CVA 2017   Type 2 diabetes mellitus with other specified complication 06/21/2017   Complication is CVA   Yeast dermatitis 03/01/2019   Past Surgical History:  Procedure Laterality Date   TUBAL LIGATION     Patient Active Problem List   Diagnosis Date Noted   Elevated serum creatinine 01/14/2021   Vitreous floaters of both eyes 12/21/2020   Poor dentition 12/21/2020   Boils of multiple sites 12/04/2020   Type 2 diabetes  mellitus with diabetic neuropathy, unspecified 03/15/2020   At risk for falls 03/14/2020   Major depressive disorder, single episode, severe 03/10/2018   Mixed conductive and sensorineural hearing loss of both ears 10/22/2017   Tinnitus of both ears 06/21/2017   Gait disorder 03/25/2017   Muscle strain 09/10/2016   Essential hypertension, benign 05/27/2016   Intracranial vascular stenosis    History of stroke    Obesity    ALLERGIC RHINITIS 03/02/2008   Hyperlipemia, secondary prevention (LDL <70) 07/18/2007    REFERRING DIAG: Muscle strain   THERAPY DIAG:  Cervicalgia  Other low back pain  Muscle weakness (generalized)  Abnormal posture  Rationale for Evaluation and Treatment Rehabilitation  PERTINENT HISTORY: History of CVA in 2017 affecting right side   PRECAUTIONS: None    SUBJECTIVE:  SUBJECTIVE STATEMENT:  Patient reports she feels pretty good.   PAIN:  Are you having pain? Yes:  NPRS scale: 5/10 Pain location: Neck, left Pain description: "it hurts" Aggravating factors: Constant pain, sleeping, turning to left, looking down Relieving factors: Muscle relaxer and pain medication, Salonpas   NPRS scale: 4/10 Pain location: Lower back Pain description: Sharp Aggravating factors: Sitting or standing long periods Relieving factors: Walking for a few minutes   OBJECTIVE: (objective measures completed at initial evaluation unless otherwise dated) PATIENT SURVEYS:  FOTO 54% functional status   POSTURE:             Rounded and elevated shoulders, forward head, kyphotic thoracic    PALPATION: Tender to palpation with increased muscle tension left upper trap        CERVICAL ROM:    Active ROM A/PROM (deg) eval   02/01/2023   02/19/2023  Flexion 20  50  Extension 25  35  Right  lateral flexion 20    Left lateral flexion 15    Right rotation 70  75  Left rotation 40 55 60   (Blank rows = not tested)   LUMBAR ROM:    Active  A/PROM  eval  Flexion 75%  Extension 25%  Right lateral flexion    Left lateral flexion    Right rotation 50%  Left rotation 50%   (Blank rows = not tested)    UPPER EXTREMITY MMT:   MMT Right eval Left eval  Shoulder flexion 4 5  Shoulder extension      Shoulder abduction 4 5  Shoulder adduction      Shoulder extension      Shoulder internal rotation      Shoulder external rotation 4 4+  Middle trapezius      Lower trapezius      Elbow flexion      Elbow extension      Wrist flexion      Wrist extension      Wrist ulnar deviation      Wrist radial deviation      Wrist pronation      Wrist supination      Grip strength       (Blank rows = not tested)   LOWER EXTREMITY MMT:     MMT Right eval Left eval  Hip flexion      Hip extension      Hip abduction      Hip adduction      Hip internal rotation      Hip external rotation      Knee flexion      Knee extension      Ankle dorsiflexion      Ankle plantarflexion      Ankle inversion      Ankle eversion       (Blank rows = not tested)    CERVICAL / LUMBAR SPECIAL TESTS:  Cervical radicular testing negative   FUNCTIONAL TESTS:  Not assessed     TODAY'S TREATMENT:       [facility] Adult PT Treatment:                                                DATE: 02/19/2023 Therapeutic Exercise: UBE L1 x 4 min (fwd/bwd) while taking subjective Seated upper trap and levator scap stretch 2 x 20 sec  each Corner pec stretch 3 x 15 sec  Row with blue 2 x 15 Extension with red 2 x 15 Sidelying thoracic rotation x 10 each Supine horizontal abduction with green 2 x 15 Supine LTR x 10 Bridge 2 x 10 Supine clamshell with green 2 x 15 Seated thoracic extension over chair x 10 Manual: Skilled palpation and monitoring of muscle tension while performing TPDN STM left upper  trap region Trigger Point Dry Needling Treatment: Pre-treatment instruction: Patient instructed on dry needling rationale, procedures, and possible side effects including pain during treatment (achy,cramping feeling), bruising, drop of blood, lightheadedness, nausea, sweating. Patient Consent Given: Yes Education handout provided: No Muscles treated: Left upper trap  Needle size and number: .30x41mm x 2 Electrical stimulation performed: No Parameters: N/A Treatment response/outcome: Twitch response elicited and Palpable decrease in muscle tension Post-treatment instructions: Patient instructed to expect possible mild to moderate muscle soreness later today and/or tomorrow. Patient instructed in methods to reduce muscle soreness and to continue prescribed HEP. If patient was dry needled over the lung field, patient was instructed on signs and symptoms of pneumothorax and, however unlikely, to see immediate medical attention should they occur. Patient was also educated on signs and symptoms of infection and to seek medical attention should they occur. Patient verbalized understanding of these instructions and education.   [facility] Adult PT Treatment:                                                DATE: 02/13/2023 Therapeutic Exercise: UBE L1 x 4 min (fwd/bwd) while taking subjective Corner pec stretch 3 x 15 sec  Sidelying thoracic rotation x 10 each Supine LTR x 10 Bridge 2 x 10 Supine clamshell with green 2 x 15 Supine horizontal abduction with green 2 x 15 Seated upper trap and levator scap stretch x 15 sec each Seated thoracic extension over chair x 10 Seated double ER and scap retraction with green 2 x 10 Row with green 2 x 15 Extension with green x 10 Manual: Skilled palpation and monitoring of muscle tension while performing TPDN STM left upper trap region Trigger Point Dry Needling Treatment: Pre-treatment instruction: Patient instructed on dry needling rationale, procedures, and  possible side effects including pain during treatment (achy,cramping feeling), bruising, drop of blood, lightheadedness, nausea, sweating. Patient Consent Given: Yes Education handout provided: No Muscles treated: Left upper trap  Needle size and number: .30x106mm x 3 Electrical stimulation performed: No Parameters: N/A Treatment response/outcome: Twitch response elicited and Palpable decrease in muscle tension Post-treatment instructions: Patient instructed to expect possible mild to moderate muscle soreness later today and/or tomorrow. Patient instructed in methods to reduce muscle soreness and to continue prescribed HEP. If patient was dry needled over the lung field, patient was instructed on signs and symptoms of pneumothorax and, however unlikely, to see immediate medical attention should they occur. Patient was also educated on signs and symptoms of infection and to seek medical attention should they occur. Patient verbalized understanding of these instructions and education.  [facility] Adult PT Treatment:                                                DATE: 02/01/2023 Therapeutic Exercise: NuStep L5 x 5  min with UE/LE while taking subjective Sidelying thoracic rotation x 10 each Supine LTR x 10 Supine horizontal abduction with red 2 x 10 SLR 2 x 10 Bridge 2 x 10 Supine clamshell with red 2 x 15 Upper trap and levator scap stretch x 15 sec each Seated double er and scap retraction with red 2 x 10 Row with green 2 x 10 Extension with green x 10     PATIENT EDUCATION:  Education details: HEP, TPDN Person educated: Patient Education method: Explanation, Demonstration, Tactile cues, Verbal cues, Handout Education comprehension: verbalized understanding, returned demonstration, verbal cues required, tactile cues required, and needs further education   HOME EXERCISE PROGRAM: Access Code: [email or web address]      ASSESSMENT: CLINICAL IMPRESSION: Patient tolerated therapy well with no adverse  effects. Continued with TPDN this visit for left cervical region with good therapeutic benefit. Therapy continues to focus on progressing her postural strength and control with good tolerance. She does demonstrate much improved cervical motion this visit and pain has been improving. No changes to HEP this visit. Patient would benefit from continued skilled PT to progress her mobility and strength in order to reduce pain and maximize functional ability.     OBJECTIVE IMPAIRMENTS: decreased activity tolerance, decreased ROM, decreased strength, impaired flexibility, postural dysfunction, and pain.    ACTIVITY LIMITATIONS: carrying, lifting, sitting, standing, sleeping, dressing, and hygiene/grooming   PARTICIPATION LIMITATIONS: cleaning and driving   PERSONAL FACTORS: Fitness, Past/current experiences, and Time since onset of injury/illness/exacerbation are also affecting patient's functional outcome.      GOALS: Goals reviewed with patient? Yes   SHORT TERM GOALS: Target date: 02/19/2023   Patient will be I with initial HEP in order to progress with therapy. Baseline: HEP provided at eval 02/19/2023: independent Goal status: MET   2.  PT will review FOTO with patient by 3rd visit in order to understand expected progress and outcome with therapy. Baseline: FOTO assessed at eval 02/19/2023: reviewed Goal status: MET   3.  Patient will report neck pain </= 6/10 in order to reduce functional limitations Baseline: 10/10 pain 02/19/2023: 5/10 Goal status: MET   LONG TERM GOALS: Target date: 03/12/2023   Patient will be I with final HEP to maintain progress from PT. Baseline: HEP provided at eval Goal status: INITIAL   2.  Patient will report >/= 64% status on FOTO to indicate improved functional ability. Baseline: 54% functional status Goal status: INITIAL   3.  Patient will demonstrate left cervical rotation >/= 60 deg in order to improve driving ability and indicate reduced left sided  muscle tension Baseline: 40 deg Goal status: INITIAL   4.  Patient will report improved sleep without being disturbed due to left sided neck pain. Baseline: patient reports poor sleep, frequently woken and difficulty getting comfortable due to left sided neck pain Goal status: INITIAL     PLAN: PT FREQUENCY: 1-2x/week   PT DURATION: 6 weeks   PLANNED INTERVENTIONS: Therapeutic exercises, Therapeutic activity, Neuromuscular re-education, Balance training, Gait training, Patient/Family education, Self Care, Joint mobilization, Joint manipulation, Aquatic Therapy, Dry Needling, Electrical stimulation, Spinal manipulation, Spinal mobilization, Cryotherapy, Moist heat, Taping, Manual therapy, and Re-evaluation   PLAN FOR NEXT SESSION: Review HEP and progress PRN, manual/dry needling for left upper trap region and neck stretching, progress postural control and strengthening as tolerated   Rosana Hoes, PT, DPT, LAT, ATC 02/19/23  10:17 AM Phone: [phone] Fax: [phone]

## 2023-02-19 ENCOUNTER — Encounter: Payer: Self-pay | Admitting: Physical Therapy

## 2023-02-19 ENCOUNTER — Other Ambulatory Visit: Payer: Self-pay

## 2023-02-19 ENCOUNTER — Ambulatory Visit: Payer: 59 | Admitting: Physical Therapy

## 2023-02-19 DIAGNOSIS — M6281 Muscle weakness (generalized): Secondary | ICD-10-CM | POA: Diagnosis not present

## 2023-02-19 DIAGNOSIS — M5459 Other low back pain: Secondary | ICD-10-CM | POA: Diagnosis not present

## 2023-02-19 DIAGNOSIS — R293 Abnormal posture: Secondary | ICD-10-CM | POA: Diagnosis not present

## 2023-02-19 DIAGNOSIS — M542 Cervicalgia: Secondary | ICD-10-CM | POA: Diagnosis not present

## 2023-02-23 ENCOUNTER — Other Ambulatory Visit: Payer: Self-pay | Admitting: Family Medicine

## 2023-02-23 DIAGNOSIS — M7061 Trochanteric bursitis, right hip: Secondary | ICD-10-CM

## 2023-02-23 DIAGNOSIS — M25551 Pain in right hip: Secondary | ICD-10-CM

## 2023-02-23 NOTE — Therapy (Addendum)
OUTPATIENT PHYSICAL THERAPY TREATMENT NOTE  DISCHARGE   Patient Name: Kathleen Larson MRN: [id number] DOB:09/25/1966, 57 y.o., female Today's Date: 02/24/2023  PCP: Fayette Pho, MD REFERRING PROVIDER: Doreene Eland, MD   END OF SESSION:   PT End of Session - 02/24/23 0934     Visit Number 5    Number of Visits 13    Date for PT Re-Evaluation 03/12/23    Authorization Type [employer] MCR    Progress Note Due on Visit 10    PT Start Time 0930    PT Stop Time 1010    PT Time Calculation (min) 40 min    Activity Tolerance Patient tolerated treatment well    Behavior During Therapy [facility] for tasks assessed/performed                Past Medical History:  Diagnosis Date   Abnormal EKG    Allergy    Back pain 10/16/2021   CONSTIPATION 03/22/2009   Qualifier: Diagnosis of  By: Daphine Deutscher FNP, Nykedtra     Diabetes mellitus without complication    Epiploic appendagitis 09/26/2018   Esophageal dysphagia 03/10/2018   Essential hypertension    GERD (gastroesophageal reflux disease)    Greater trochanteric bursitis of right hip 05/26/2017   Grief 10/16/2021   Healthcare maintenance 08/14/2019   HELICOBACTER PYLORI INFECTION, HX OF 01/28/2009   Qualifier: Diagnosis of  By: Daphine Deutscher FNP, Nykedtra     Hyperlipidemia    Hypertension    Knee buckling, left 09/19/2020   Left knee pain 10/18/2020   Lesion of tongue 10/22/2017   LGSIL on Pap smear of cervix 08/14/2019   Medical non-compliance    Mild cognitive impairment 12/27/2017   Neck muscle strain 09/10/2016   Stroke    CVA 2017   Type 2 diabetes mellitus with other specified complication 06/21/2017   Complication is CVA   Yeast dermatitis 03/01/2019   Past Surgical History:  Procedure Laterality Date   TUBAL LIGATION     Patient Active Problem List   Diagnosis Date Noted   Elevated serum creatinine 01/14/2021   Vitreous floaters of both eyes 12/21/2020   Poor dentition 12/21/2020   Boils of multiple sites 12/04/2020    Type 2 diabetes mellitus with diabetic neuropathy, unspecified 03/15/2020   At risk for falls 03/14/2020   Major depressive disorder, single episode, severe 03/10/2018   Mixed conductive and sensorineural hearing loss of both ears 10/22/2017   Tinnitus of both ears 06/21/2017   Gait disorder 03/25/2017   Muscle strain 09/10/2016   Essential hypertension, benign 05/27/2016   Intracranial vascular stenosis    History of stroke    Obesity    ALLERGIC RHINITIS 03/02/2008   Hyperlipemia, secondary prevention (LDL <70) 07/18/2007    REFERRING DIAG: Muscle strain   THERAPY DIAG:  Cervicalgia  Other low back pain  Muscle weakness (generalized)  Abnormal posture  Rationale for Evaluation and Treatment Rehabilitation  PERTINENT HISTORY: History of CVA in 2017 affecting right side   PRECAUTIONS: None    SUBJECTIVE:  SUBJECTIVE STATEMENT:  Patient reports she is doing well. She is sleeping much better and she feels much looser. She has been consistent with her exercises at home.  PAIN:  Are you having pain? Yes:  NPRS scale: 3/10 Pain location: Neck, left Pain description: "it hurts" Aggravating factors: Constant pain, sleeping, turning to left, looking down Relieving factors: Muscle relaxer and pain medication, Salonpas   NPRS scale: 3/10 Pain location: Lower back Pain description: Sharp Aggravating factors: Sitting or standing long periods Relieving factors: Walking for a few minutes   OBJECTIVE: (objective measures completed at initial evaluation unless otherwise dated) PATIENT SURVEYS:  FOTO 54% functional status  02/24/2023: 67%   POSTURE:             Rounded and elevated shoulders, forward head, kyphotic thoracic    PALPATION: Tender to palpation with increased muscle tension left upper  trap        CERVICAL ROM:    Active ROM A/PROM (deg) eval   02/01/2023   02/19/2023  Flexion 20  50  Extension 25  35  Right lateral flexion 20    Left lateral flexion 15    Right rotation 70  75  Left rotation 40 55 60   (Blank rows = not tested)   LUMBAR ROM:    Active  A/PROM  eval  Flexion 75%  Extension 25%  Right lateral flexion    Left lateral flexion    Right rotation 50%  Left rotation 50%   (Blank rows = not tested)    UPPER EXTREMITY MMT:   MMT Right eval Left eval  Shoulder flexion 4 5  Shoulder extension      Shoulder abduction 4 5  Shoulder adduction      Shoulder extension      Shoulder internal rotation      Shoulder external rotation 4 4+  Middle trapezius      Lower trapezius      Elbow flexion      Elbow extension      Wrist flexion      Wrist extension      Wrist ulnar deviation      Wrist radial deviation      Wrist pronation      Wrist supination      Grip strength       (Blank rows = not tested)   LOWER EXTREMITY MMT:     MMT Right eval Left eval  Hip flexion      Hip extension      Hip abduction      Hip adduction      Hip internal rotation      Hip external rotation      Knee flexion      Knee extension      Ankle dorsiflexion      Ankle plantarflexion      Ankle inversion      Ankle eversion       (Blank rows = not tested)    CERVICAL / LUMBAR SPECIAL TESTS:  Cervical radicular testing negative   FUNCTIONAL TESTS:  Not assessed     TODAY'S TREATMENT:       [facility] Adult PT Treatment:                                                DATE: 02/24/2023 Therapeutic  Exercise: UBE L3 x 4 min (fwd/bwd) while taking subjective Sidelying thoracic rotation x 10 each Supine horizontal abduction with green 2 x 15 Supine LTR 2 x 10 Bridge 2 x 10 Supine clamshell with green 2 x 15 Seated upper trap and levator scap stretch 2 x 20 sec each Seated thoracic extension over chair x 10 Corner pec stretch 3 x 15 sec  Row with  green 2 x 20 Extension with green 2 x 15   [facility] Adult PT Treatment:                                                DATE: 02/19/2023 Therapeutic Exercise: UBE L1 x 4 min (fwd/bwd) while taking subjective Seated upper trap and levator scap stretch 2 x 20 sec each Corner pec stretch 3 x 15 sec  Row with blue 2 x 15 Extension with red 2 x 15 Sidelying thoracic rotation x 10 each Supine horizontal abduction with green 2 x 15 Supine LTR x 10 Bridge 2 x 10 Supine clamshell with green 2 x 15 Seated thoracic extension over chair x 10 Manual: Skilled palpation and monitoring of muscle tension while performing TPDN STM left upper trap region Trigger Point Dry Needling Treatment: Pre-treatment instruction: Patient instructed on dry needling rationale, procedures, and possible side effects including pain during treatment (achy,cramping feeling), bruising, drop of blood, lightheadedness, nausea, sweating. Patient Consent Given: Yes Education handout provided: No Muscles treated: Left upper trap  Needle size and number: .30x16mm x 2 Electrical stimulation performed: No Parameters: N/A Treatment response/outcome: Twitch response elicited and Palpable decrease in muscle tension Post-treatment instructions: Patient instructed to expect possible mild to moderate muscle soreness later today and/or tomorrow. Patient instructed in methods to reduce muscle soreness and to continue prescribed HEP. If patient was dry needled over the lung field, patient was instructed on signs and symptoms of pneumothorax and, however unlikely, to see immediate medical attention should they occur. Patient was also educated on signs and symptoms of infection and to seek medical attention should they occur. Patient verbalized understanding of these instructions and education.  [facility] Adult PT Treatment:                                                DATE: 02/13/2023 Therapeutic Exercise: UBE L1 x 4 min (fwd/bwd) while taking  subjective Corner pec stretch 3 x 15 sec  Sidelying thoracic rotation x 10 each Supine LTR x 10 Bridge 2 x 10 Supine clamshell with green 2 x 15 Supine horizontal abduction with green 2 x 15 Seated upper trap and levator scap stretch x 15 sec each Seated thoracic extension over chair x 10 Seated double ER and scap retraction with green 2 x 10 Row with green 2 x 15 Extension with green x 10 Manual: Skilled palpation and monitoring of muscle tension while performing TPDN STM left upper trap region Trigger Point Dry Needling Treatment: Pre-treatment instruction: Patient instructed on dry needling rationale, procedures, and possible side effects including pain during treatment (achy,cramping feeling), bruising, drop of blood, lightheadedness, nausea, sweating. Patient Consent Given: Yes Education handout provided: No Muscles treated: Left upper trap  Needle size and number: .30x29mm x 3 Electrical stimulation performed: No Parameters: N/A  Treatment response/outcome: Twitch response elicited and Palpable decrease in muscle tension Post-treatment instructions: Patient instructed to expect possible mild to moderate muscle soreness later today and/or tomorrow. Patient instructed in methods to reduce muscle soreness and to continue prescribed HEP. If patient was dry needled over the lung field, patient was instructed on signs and symptoms of pneumothorax and, however unlikely, to see immediate medical attention should they occur. Patient was also educated on signs and symptoms of infection and to seek medical attention should they occur. Patient verbalized understanding of these instructions and education.   PATIENT EDUCATION:  Education details: POC discharge, HEP Person educated: Patient Education method: Explanation, Handout Education comprehension: Verbalized understanding, returned demonstration   HOME EXERCISE PROGRAM: Access Code: [email or web address]      ASSESSMENT: CLINICAL  IMPRESSION: Patient tolerated therapy well with no adverse effects. She has achieved all established goals demonstrating improved cervical motion and reporting improved functional ability on FOTO. She is independent with HEP so will discharge and transition to home exercises.      OBJECTIVE IMPAIRMENTS: decreased activity tolerance, decreased ROM, decreased strength, impaired flexibility, postural dysfunction, and pain.    ACTIVITY LIMITATIONS: carrying, lifting, sitting, standing, sleeping, dressing, and hygiene/grooming   PARTICIPATION LIMITATIONS: cleaning and driving   PERSONAL FACTORS: Fitness, Past/current experiences, and Time since onset of injury/illness/exacerbation are also affecting patient's functional outcome.      GOALS: Goals reviewed with patient? Yes   SHORT TERM GOALS: Target date: 02/19/2023   Patient will be I with initial HEP in order to progress with therapy. Baseline: HEP provided at eval 02/19/2023: independent Goal status: MET   2.  PT will review FOTO with patient by 3rd visit in order to understand expected progress and outcome with therapy. Baseline: FOTO assessed at eval 02/19/2023: reviewed Goal status: MET   3.  Patient will report neck pain </= 6/10 in order to reduce functional limitations Baseline: 10/10 pain 02/19/2023: 5/10 Goal status: MET   LONG TERM GOALS: Target date: 03/12/2023   Patient will be I with final HEP to maintain progress from PT. Baseline: HEP provided at eval 02/24/2023: independent Goal status: MET   2.  Patient will report >/= 64% status on FOTO to indicate improved functional ability. Baseline: 54% functional status 02/24/2023: 67% Goal status: MET   3.  Patient will demonstrate left cervical rotation >/= 60 deg in order to improve driving ability and indicate reduced left sided muscle tension Baseline: 40 deg 02/24/2023: 60 deg Goal status: MET   4.  Patient will report improved sleep without being disturbed due to  left sided neck pain. Baseline: patient reports poor sleep, frequently woken and difficulty getting comfortable due to left sided neck pain 02/24/2023: improved sleep Goal status: MET     PLAN: PT FREQUENCY: 1-2x/week   PT DURATION: 6 weeks   PLANNED INTERVENTIONS: Therapeutic exercises, Therapeutic activity, Neuromuscular re-education, Balance training, Gait training, Patient/Family education, Self Care, Joint mobilization, Joint manipulation, Aquatic Therapy, Dry Needling, Electrical stimulation, Spinal manipulation, Spinal mobilization, Cryotherapy, Moist heat, Taping, Manual therapy, and Re-evaluation   PLAN FOR NEXT SESSION: NA - discharge   Rosana Hoes, PT, DPT, LAT, ATC 02/24/23  10:15 AM Phone: [phone] Fax: [phone]   PHYSICAL THERAPY DISCHARGE SUMMARY  Visits from Start of Care: 5  Current functional level related to goals / functional outcomes: See above   Remaining deficits: See above   Education / Equipment: HEP   Patient agrees to discharge. Patient goals were met. Patient is being discharged due  to meeting the stated rehab goals.

## 2023-02-23 NOTE — Telephone Encounter (Signed)
Will refuse refill request for tramadol. Only meant as short course for low back pain. As discussed before, continue heat, ice, stretching, and PT. OK to take tylenol, aleve, and lidocaine patches.   Fayette Pho, MD

## 2023-02-24 ENCOUNTER — Encounter: Payer: Self-pay | Admitting: Physical Therapy

## 2023-02-24 ENCOUNTER — Ambulatory Visit: Payer: 59 | Admitting: Physical Therapy

## 2023-02-24 ENCOUNTER — Other Ambulatory Visit: Payer: Self-pay

## 2023-02-24 DIAGNOSIS — M5459 Other low back pain: Secondary | ICD-10-CM

## 2023-02-24 DIAGNOSIS — M6281 Muscle weakness (generalized): Secondary | ICD-10-CM | POA: Diagnosis not present

## 2023-02-24 DIAGNOSIS — R293 Abnormal posture: Secondary | ICD-10-CM

## 2023-02-24 DIAGNOSIS — M542 Cervicalgia: Secondary | ICD-10-CM | POA: Diagnosis not present

## 2023-02-24 NOTE — Patient Instructions (Signed)
Access Code: [email or web address] URL: [email or web address] Date: 02/24/2023 Prepared by: Rosana Hoes  Exercises - Sidelying Thoracic Lumbar Rotation  - 1 x daily - 10 reps - 5 seconds hold - Supine Lower Trunk Rotation  - 1 x daily - 10 reps - 5 seconds hold - Supine Shoulder Horizontal Abduction with Resistance  - 1 x daily - 2 sets - 15 reps - Bridge  - 1 x daily - 2 sets - 10 reps - Seated Gentle Upper Trapezius Stretch  - 1 x daily - 3 reps - 15 seconds hold - Gentle Levator Scapulae Stretch  - 1 x daily - 3 reps - 15 seconds hold - Corner Pec Major Stretch  - 1 x daily - 3 reps - 15 seconds hold - Standing Row with Anchored Resistance  - 1 x daily - 2 sets - 15 reps - Scapular Retraction with Resistance Advanced  - 1 x daily - 2 sets - 15 reps

## 2023-03-20 NOTE — Patient Instructions (Signed)
It was wonderful to see you today. Thank you for allowing me to be a part of your care. Below is a short summary of what we discussed at your visit today:  Leg pain The symptoms and physical exam make this look most like sciatica.  Take naproxen twice daily for 5 days to calm down the inflammation. You may use ice or heat over the left buttocks area to help as well if needed. Okay to take Tylenol too. Save tramadol for excruciating pain not responsive to Tylenol, naproxen, stretching, and heat. Do the stretches I provided you in this handout.  Diabetes I have referred you to ophthalmology for routine diabetic eye exam.  Someone from their office should be calling you in 1 to 2 weeks to schedule an appointment.  If you do not hear from them, let us know. We may need to nudge along the referral.    Your last A1c check was March 1.  Your next eligible June 5, however given your excellent control we can consider doing this every 6 months.  Ideally would return for A1c check September.  Hypertension Because of the blood pressure medications you are on, we will check your kidney function and potassium today. If the results are normal, I will send you a letter or MyChart message. If the results are abnormal, I will give you a call.    Primary care doctor graduating soon! I am graduating June 28th. You will be assigned a new PCP.  Thank you for letting me care for you! It has been wonderful getting to know you.    Please bring all of your medications to every appointment!  If you have any questions or concerns, please do not hesitate to contact us via phone or MyChart message.   Fayette Pho, MD

## 2023-03-22 ENCOUNTER — Encounter: Payer: Self-pay | Admitting: Family Medicine

## 2023-03-22 ENCOUNTER — Ambulatory Visit (INDEPENDENT_AMBULATORY_CARE_PROVIDER_SITE_OTHER): Payer: 59 | Admitting: Family Medicine

## 2023-03-22 VITALS — BP 126/90 | HR 66 | Ht 64.0 in | Wt 224.8 lb

## 2023-03-22 DIAGNOSIS — E1140 Type 2 diabetes mellitus with diabetic neuropathy, unspecified: Secondary | ICD-10-CM | POA: Diagnosis not present

## 2023-03-22 DIAGNOSIS — I679 Cerebrovascular disease, unspecified: Secondary | ICD-10-CM | POA: Diagnosis not present

## 2023-03-22 DIAGNOSIS — I10 Essential (primary) hypertension: Secondary | ICD-10-CM

## 2023-03-22 DIAGNOSIS — M5432 Sciatica, left side: Secondary | ICD-10-CM | POA: Diagnosis not present

## 2023-03-22 MED ORDER — TRAMADOL HCL 50 MG PO TABS: 50.0000 mg | ORAL_TABLET | Freq: Two times a day (BID) | ORAL | 0 refills | Status: AC | PRN

## 2023-03-22 MED ORDER — NAPROXEN 500 MG PO TABS: 500.0000 mg | ORAL_TABLET | Freq: Two times a day (BID) | ORAL | 0 refills | Status: DC

## 2023-03-22 NOTE — Progress Notes (Unsigned)
    SUBJECTIVE:   CHIEF COMPLAINT / HPI:   Left leg pain Several weeks duration Pain radiates from left buttocks down leg No discernible pattern to the pain Still going to PT for lower back pain No red flags: Denies saddle anesthesia, gait abnormalities, urinary incontinence  PERTINENT  PMH / PSH:  Patient Active Problem List   Diagnosis Date Noted   Sciatica of left side 03/23/2023   Elevated serum creatinine 01/14/2021   Vitreous floaters of both eyes 12/21/2020   Poor dentition 12/21/2020   Boils of multiple sites 12/04/2020   Type 2 diabetes mellitus with diabetic neuropathy, unspecified (HCC) 03/15/2020   At risk for falls 03/14/2020   Major depressive disorder, single episode, severe (HCC) 03/10/2018   Mixed conductive and sensorineural hearing loss of both ears 10/22/2017   Tinnitus of both ears 06/21/2017   Gait disorder 03/25/2017   Muscle strain 09/10/2016   Essential hypertension, benign 05/27/2016   Intracranial vascular stenosis    History of stroke    Obesity    ALLERGIC RHINITIS 03/02/2008   Hyperlipemia, secondary prevention (LDL <70) 07/18/2007    OBJECTIVE:   BP (!) 126/90   Pulse 66   Ht 5\' 4"  (1.626 m)   Wt 224 lb 12.8 oz (102 kg)   LMP 10/14/2012   SpO2 100%   BMI 38.59 kg/m    General: Awake, alert, NAD Respiratory: Speaking clearly in full sentences, no respiratory distress MSK: Mild TTP of bilateral lumbar paraspinals, no midline tenderness, no lateral hip tenderness over IT band bilaterally, iliac crests aligned bilaterally, moving legs spontaneously easily, negative straight leg raise bilaterally, negative FADIR/FABER bilaterally Neuro: BLE strength 5/5 and equal bilaterally of hips, knees, and ankles  ASSESSMENT/PLAN:   Type 2 diabetes mellitus with diabetic neuropathy, unspecified (HCC) Referral to ophthalmology today for routine diabetic eye exam.  Sciatica of left side New onset subacute left-sided sciatica.  She is currently  going to PT for low back pain.  No red flags at this time.  Recommend Aleve twice daily x 5 days, and sciatica stretches afterwards.  Refilled limited dose tramadol for as needed use.     Fayette Pho, MD [facility]

## 2023-03-23 ENCOUNTER — Encounter: Payer: Self-pay | Admitting: Family Medicine

## 2023-03-23 DIAGNOSIS — M5432 Sciatica, left side: Secondary | ICD-10-CM | POA: Insufficient documentation

## 2023-03-23 LAB — BASIC METABOLIC PANEL
BUN/Creatinine Ratio: 16 (ref 9–23)
BUN: 15 mg/dL (ref 6–24)
CO2: 24 mmol/L (ref 20–29)
Calcium: 9.6 mg/dL (ref 8.7–10.2)
Chloride: 100 mmol/L (ref 96–106)
Creatinine, Ser: 0.96 mg/dL (ref 0.57–1.00)
Glucose: 88 mg/dL (ref 70–99)
Potassium: 4.1 mmol/L (ref 3.5–5.2)
Sodium: 140 mmol/L (ref 134–144)
eGFR: 68 mL/min/{1.73_m2} (ref >59)

## 2023-03-23 NOTE — Assessment & Plan Note (Addendum)
New onset subacute left-sided sciatica.  She is currently going to PT for low back pain.  No red flags at this time.  Recommend Aleve twice daily x 5 days, and sciatica stretches afterwards.  Refilled limited dose tramadol for as needed use.

## 2023-03-23 NOTE — Assessment & Plan Note (Signed)
Referral to ophthalmology today for routine diabetic eye exam.

## 2023-03-30 ENCOUNTER — Other Ambulatory Visit: Payer: Self-pay | Admitting: Family Medicine

## 2023-03-30 DIAGNOSIS — E785 Hyperlipidemia, unspecified: Secondary | ICD-10-CM

## 2023-04-13 ENCOUNTER — Other Ambulatory Visit: Payer: Self-pay

## 2023-04-13 DIAGNOSIS — E785 Hyperlipidemia, unspecified: Secondary | ICD-10-CM

## 2023-04-13 DIAGNOSIS — E1140 Type 2 diabetes mellitus with diabetic neuropathy, unspecified: Secondary | ICD-10-CM

## 2023-04-13 MED ORDER — MOUNJARO 5 MG/0.5ML ~~LOC~~ SOAJ: 5.0000 mg | SUBCUTANEOUS | 3 refills | Status: DC

## 2023-04-13 NOTE — Telephone Encounter (Signed)
Patient calls nurse line reporting difficulty picking up Mounjaro refill.   She reports [facility] is out of stock of the [employer] 5mg  pens.   She reports [facility] on [address] has stock.   Will forward to PCP to send in.

## 2023-05-05 ENCOUNTER — Ambulatory Visit (INDEPENDENT_AMBULATORY_CARE_PROVIDER_SITE_OTHER): Payer: 59 | Admitting: Family Medicine

## 2023-05-05 ENCOUNTER — Encounter: Payer: Self-pay | Admitting: Family Medicine

## 2023-05-05 VITALS — BP 112/70 | HR 74 | Ht 64.0 in | Wt 220.2 lb

## 2023-05-05 DIAGNOSIS — E1140 Type 2 diabetes mellitus with diabetic neuropathy, unspecified: Secondary | ICD-10-CM | POA: Diagnosis not present

## 2023-05-05 DIAGNOSIS — M7062 Trochanteric bursitis, left hip: Secondary | ICD-10-CM | POA: Diagnosis not present

## 2023-05-05 LAB — POCT GLYCOSYLATED HEMOGLOBIN (HGB A1C): HbA1c, POC (controlled diabetic range): 6.7 % (ref 0.0–7.0)

## 2023-05-05 NOTE — Assessment & Plan Note (Signed)
A1c today 6.7, which is within controlled range on current medications.

## 2023-05-05 NOTE — Progress Notes (Signed)
    SUBJECTIVE:   CHIEF COMPLAINT / HPI:   T2DM - Medications: farxiga 10mg  daily, tirzepatide 5mg  weekly, atorvastatin 80mg  daily - Compliance: good - microalbumin:   3.7 on 08/20/22 - denies symptoms of hypoglycemia, polyuria, polydipsia, numbness extremities, foot ulcers/trauma  Pain in Left Hip and Leg - Has been present for several weeks - Was seen in the office on 5/13 for similar symptoms and diagnosed with sciatica  - Has not had improvement with Tylenol and stretches - Reports pain is worse in the lateral hip and radiates down her leg - When she is laying down she feels like it radiates to her toes.   PERTINENT  PMH / PSH: T2DM with neuropathy, CVA, Sciatica of left side  OBJECTIVE:   BP 112/70   Pulse 74   Ht 5\' 4"  (1.626 m)   Wt 220 lb 4 oz (99.9 kg)   LMP 10/14/2012   SpO2 97%   BMI 37.81 kg/m   General: NAD, well-appearing, well-nourished Respiratory: No respiratory distress, breathing comfortably, able to speak in full sentences Skin: warm and dry, no rashes noted on exposed skin Psych: Appropriate affect and mood Left hip:  - Inspection: No gross deformity, no swelling, erythema, or ecchymosis - Palpation: TTP over the left greater trochanter - ROM: Normal range of motion on Flexion, extension, abduction, internal and external rotation, though slightly limited secondary to lateral hip pain - Strength: Normal strength. - Neuro/vasc: NV intact distally - Special Tests: Positive FABER   Left greater trochanter injection Consent obtained and verified. Time-out conducted. Noted no overlying erythema, induration, or other signs of local infection. Skin prepped in a sterile fashion. Topical analgesic spray: none. Site: Left greater trochanter Needle: 25G 1.5" Completed without difficulty.  Advised to call if fevers/chills, erythema, induration, drainage, or persistent bleeding.  ASSESSMENT/PLAN:   Trochanteric bursitis of left hip Physical exam and  history most consistent with a trochanteric bursitis of the left side.  Patient does report some radiation of the pain down to her toes intermittently, though this would not necessarily correlate with a trochanteric bursitis but patient does have a history of neuropathy and possible sciatica on that side.  Injection of the bursa was done today and was well-tolerated by the patient - Patient given trochanteric bursitis exercises/stretches - Patient follow-up as needed - Tylenol as needed  Type 2 diabetes mellitus with diabetic neuropathy, unspecified (HCC) A1c today 6.7, which is within controlled range on current medications.     Evelena Leyden, DO [facility]

## 2023-05-05 NOTE — Assessment & Plan Note (Signed)
Physical exam and history most consistent with a trochanteric bursitis of the left side.  Patient does report some radiation of the pain down to her toes intermittently, though this would not necessarily correlate with a trochanteric bursitis but patient does have a history of neuropathy and possible sciatica on that side.  Injection of the bursa was done today and was well-tolerated by the patient - Patient given trochanteric bursitis exercises/stretches - Patient follow-up as needed - Tylenol as needed

## 2023-05-05 NOTE — Patient Instructions (Addendum)
I think some of the pain that you are having in your leg is actually related to a hip bursitis.  We did an injection of steroid and lidocaine today to try and help with that.  I want you to do the exercises that were given to try and help decrease inflammation as well.  You can continue to take Tylenol as needed.  If this is not getting any better or if it starts to not improve after a week or 2 then you can go ahead and come back into the office for reevaluation.   Your A1c today was 6.7%, which is great!  Keep up the good work!

## 2023-06-11 ENCOUNTER — Other Ambulatory Visit: Payer: Self-pay

## 2023-06-11 DIAGNOSIS — R7989 Other specified abnormal findings of blood chemistry: Secondary | ICD-10-CM

## 2023-06-11 DIAGNOSIS — I10 Essential (primary) hypertension: Secondary | ICD-10-CM

## 2023-06-11 MED ORDER — LOSARTAN POTASSIUM-HCTZ 50-12.5 MG PO TABS: 1.0000 | ORAL_TABLET | Freq: Every day | ORAL | 1 refills | Status: DC

## 2023-08-19 ENCOUNTER — Other Ambulatory Visit: Payer: Self-pay | Admitting: Family Medicine

## 2023-08-19 DIAGNOSIS — E1169 Type 2 diabetes mellitus with other specified complication: Secondary | ICD-10-CM

## 2023-08-19 DIAGNOSIS — G3184 Mild cognitive impairment, so stated: Secondary | ICD-10-CM

## 2023-08-19 DIAGNOSIS — K219 Gastro-esophageal reflux disease without esophagitis: Secondary | ICD-10-CM

## 2023-08-19 DIAGNOSIS — F322 Major depressive disorder, single episode, severe without psychotic features: Secondary | ICD-10-CM

## 2023-09-13 ENCOUNTER — Other Ambulatory Visit: Payer: Self-pay | Admitting: Family Medicine

## 2023-09-13 DIAGNOSIS — E785 Hyperlipidemia, unspecified: Secondary | ICD-10-CM

## 2023-09-17 ENCOUNTER — Telehealth: Payer: Self-pay | Admitting: Family Medicine

## 2023-09-17 ENCOUNTER — Ambulatory Visit (INDEPENDENT_AMBULATORY_CARE_PROVIDER_SITE_OTHER): Payer: 59 | Admitting: Family Medicine

## 2023-09-17 VITALS — BP 145/100 | HR 81 | Ht 64.0 in | Wt 212.4 lb

## 2023-09-17 DIAGNOSIS — M5432 Sciatica, left side: Secondary | ICD-10-CM

## 2023-09-17 DIAGNOSIS — E785 Hyperlipidemia, unspecified: Secondary | ICD-10-CM | POA: Diagnosis not present

## 2023-09-17 DIAGNOSIS — I10 Essential (primary) hypertension: Secondary | ICD-10-CM | POA: Diagnosis not present

## 2023-09-17 DIAGNOSIS — E1140 Type 2 diabetes mellitus with diabetic neuropathy, unspecified: Secondary | ICD-10-CM | POA: Diagnosis not present

## 2023-09-17 DIAGNOSIS — M7062 Trochanteric bursitis, left hip: Secondary | ICD-10-CM | POA: Diagnosis not present

## 2023-09-17 LAB — POCT GLYCOSYLATED HEMOGLOBIN (HGB A1C): Hemoglobin A1C: 6.4 % — AB (ref 4.0–5.6)

## 2023-09-17 MED ORDER — AMLODIPINE BESYLATE 5 MG PO TABS: 5.0000 mg | ORAL_TABLET | Freq: Every day | ORAL | 3 refills | Status: DC

## 2023-09-17 NOTE — Telephone Encounter (Signed)
Patient calls nurse line returning providers phone call.   She reports she does "choke up" every once in a while when she has something to drink. When pressed further she stated, "well it is everytime I drink something."  She denies any weakness or trouble breathing.   Patient scheduled for a FU with [facility]noor in one month.

## 2023-09-17 NOTE — Assessment & Plan Note (Addendum)
A1C today 6.0 Urine M/C ratio ordered and pending. Patient tolerating current regimen well. - continue Farxiga 10 mg daily, Mounjaro 5 mg weekly

## 2023-09-17 NOTE — Progress Notes (Cosign Needed Addendum)
    SUBJECTIVE:   CHIEF COMPLAINT / HPI:  Patient presents for check up. She has ongoing left leg pain.   Left leg pain - sharp pain that radiates to toes. Has pain in her buttocks on the left side. She still has residual weakness on her right side d/t stroke. She feels she may be compensating with the left and this is why it is causing her pain. She was treated with corticosteroid injection for trochanteric bursitis in 04/2023.  Out of amlodipine for the past three days. Has not had any headaches, shortness of breath, vision changes, chest pain or new focal deficits. Denies any difficulty swallowing or speaking. Denies feeling more weak on right side from prior.   PERTINENT  PMH / PSH:  DMII - farxiga 10 mg, mounjaro 5mg   HLD - lipitor 80 mg  HTN - amlodpine 5 mg, hyzaar 50-12.5 Trochanteric bursitis- baclofen, naproxen MDD- sertraline Hx of stroke- aspirin   OBJECTIVE:   BP (!) 145/100   Pulse 81   Ht 5\' 4"  (1.626 m)   Wt 212 lb 6.4 oz (96.3 kg)   LMP 10/14/2012   SpO2 100%   BMI 36.46 kg/m   General: A&O, NAD HEENT: No sign of trauma, EOM grossly intact Cardiac: RRR, no m/r/g Respiratory: CTAB, normal WOB, no w/c/r GI: Soft, NTTP, non-distended  Extremities: NTTP, no peripheral edema. Neuro:  MENTAL STATUS: AAOx3, memory intact, fund of knowledge appropriate LANG/SPEECH: Naming and repetition intact, fluent, follows 3-step commands CRANIAL NERVES: II: Pupils equal and reactive III, IV, VI: EOM intact, no gaze preference or deviation, no nystagmus. VII: no asymmetry, no nasolabial fold flattening IX, X: normal palatal elevation, slight deviation of uvula to the left.  XI: 5/5 head turn and 5/5 shoulder shrug on left side, 4/5 shoulder shrug on right side XII: mild tongue protrusion to the right  MOTOR: 5/5 muscle power in left shoulder, elbows, and wrist. Slightly decreased ROM on L hip and right knee with strength 5/5.  4/5 muscle power in R shoulder, elbow, and  wrist. 4/5 muscle power in R knee and foot.  SENSORY: Normal to touch, pinprick, vibration, temp all limbs STATION: normal stance, no truncal ataxia GAIT: slow gait with decreased strength on R side. Unable to complete heel and toe raises d/t residual weakness on R side after stroke.    ASSESSMENT/PLAN:   Type 2 diabetes mellitus with diabetic neuropathy, unspecified (HCC) A1C today 6.0 Urine M/C ratio ordered and pending. Patient tolerating current regimen well. - continue Farxiga 10 mg daily, Mounjaro 5 mg weekly  Essential hypertension, benign Blood pressure not at goal today. (160/104 >> 145/100). Likely in the setting of running out of medication for the past few days. Will refill norvasc. Patient has no symptoms currently. Will schedule patient for repeat blood pressure check with Dr. Raymondo Band on 11/14. Had extensive discussion about red flag symptoms and ED precautions.  - Norvasc 5 mg - Hyzaar 50-12.5   Sciatica of left side Patient describing pain radiating from knee to toes on left side. Pain is consistent with compression of sciatic nerve. Likely 2/2 to overcompensation of musculature on left side d/t residual right sided deficit on R side - referral to PT. - Voltaren gel TID prn   Hyperlipemia, secondary prevention (LDL <70) Ordered lipid panel. Continue atorvastatin 80mg      Hal Morales, MD [facility]

## 2023-09-17 NOTE — Assessment & Plan Note (Signed)
Patient describing pain radiating from knee to toes on left side. Pain is consistent with compression of sciatic nerve. Likely 2/2 to overcompensation of musculature on left side d/t residual right sided deficit on R side - referral to PT. - Voltaren gel TID prn

## 2023-09-17 NOTE — Patient Instructions (Addendum)
It was wonderful to see you today.  Please bring ALL of your medications with you to every visit.   Today we talked about:  Diabetes Your diabetes is well controlled Your goal is to have an A1c < 7 Medicine Changes: No changes, keep up the great work!   Hypertension Your blood pressure was high today. I sent your medicine to the pharmacy. Please pick that up ASAP and start it TODAY. Given your history of stroke, please follow up in one week for a blood pressure check. That appointment is scheduled for you with Dr. Raymondo Band on 11/14 at 9:30 AM.   Hip/Leg pain - I sent a referral to sports medicine. If you do not get a call in the next week, please call our office at the number below. - You can try tylenol and topical Voltaren gel.  - I have included some exercises for you.   Cholesterol Please continue taking your medicine. I will call you about your results from todays blood work.  Thank you for choosing [facility].   Please call [phone] with any questions about today's appointment.  Please arrive at least 15 minutes prior to your scheduled appointments.   If you had blood work today, I will send you a MyChart message or a letter if results are normal. Otherwise, I will give you a call.   If you had a referral placed, they will call you to set up an appointment. Please give [place] a call if you don't hear back in the next 2 weeks.   If you need additional refills before your next appointment, please call your pharmacy first.   Hal Morales, MD Family Medicine

## 2023-09-17 NOTE — Telephone Encounter (Signed)
Called patient to discuss neurological findings on exam today.  Per chart review it has not been documented that patient had any tongue deviation.  During visit today, patient denied any difficulty swallowing or any new changes or difficulty breathing.  Told patient to leave the nurse message discussing neurological findings and if she has had any new onset difficulty swallowing, difficulty with eating or drinking, or any other focal deficits.  Also asked patient to schedule a follow-up appointment in 1 month to discuss MRI imaging to follow-up on chronic focal deficits after her last stroke. Return phone number and ED precautions provided.

## 2023-09-17 NOTE — Assessment & Plan Note (Addendum)
Blood pressure not at goal today. (160/104 >> 145/100). Likely in the setting of running out of medication for the past few days. Will refill norvasc. Patient has no symptoms currently. Will schedule patient for repeat blood pressure check with Dr. Raymondo Band on 11/14. Had extensive discussion about red flag symptoms and ED precautions.  - Norvasc 5 mg - Hyzaar 50-12.5

## 2023-09-17 NOTE — Assessment & Plan Note (Signed)
Ordered lipid panel. Continue atorvastatin 80mg 

## 2023-09-18 LAB — MICROALBUMIN / CREATININE URINE RATIO
Creatinine, Urine: 59.1 mg/dL
Microalb/Creat Ratio: <5 mg/g{creat} (ref 0–29)
Microalbumin, Urine: <3 ug/mL

## 2023-09-18 LAB — LIPID PANEL
Chol/HDL Ratio: 2.4 ratio (ref 0.0–4.4)
Cholesterol, Total: 131 mg/dL (ref 100–199)
HDL: 54 mg/dL (ref >39)
LDL Chol Calc (NIH): 65 mg/dL (ref 0–99)
Triglycerides: 55 mg/dL (ref 0–149)
VLDL Cholesterol Cal: 12 mg/dL (ref 5–40)

## 2023-09-20 ENCOUNTER — Encounter: Payer: Self-pay | Admitting: Family Medicine

## 2023-09-20 ENCOUNTER — Telehealth: Payer: Self-pay

## 2023-09-20 NOTE — Telephone Encounter (Signed)
-----   Message from [facility] sent at 09/20/2023  9:29 AM EST ----- Regarding: RE: MRI follow up I called her today and left a message!   Please call her to schedule a follow up in the next 1-2 weeks Red Team :) ----- Message ----- From: Billey Co, MD Sent: 09/20/2023   9:25 AM EST To: Mahnoor Baloch, MD Subject: MRI follow up                                  Driscilla Grammes,  I saw yall were able to reach her, I definitely think reasonable to see her back sooner than one month to consider MRI due to some of the coughing with swallowing. Good job follow up on patient.

## 2023-09-20 NOTE — Telephone Encounter (Signed)
Called patient again, no answer. LVM informing patient to schedule a follow-up appointment in 1-2 weeks. Penni Bombard CMA

## 2023-09-20 NOTE — Telephone Encounter (Signed)
Called patient for clarification regarding symptoms. Patient did not answer. Provided phone number to return call and gave ED precautions.

## 2023-09-23 ENCOUNTER — Other Ambulatory Visit: Payer: Self-pay | Admitting: Family Medicine

## 2023-09-23 ENCOUNTER — Ambulatory Visit (INDEPENDENT_AMBULATORY_CARE_PROVIDER_SITE_OTHER): Payer: 59 | Admitting: Pharmacist

## 2023-09-23 ENCOUNTER — Encounter: Payer: Self-pay | Admitting: Pharmacist

## 2023-09-23 ENCOUNTER — Telehealth: Payer: Self-pay

## 2023-09-23 VITALS — BP 133/84 | HR 66 | Wt 212.0 lb

## 2023-09-23 DIAGNOSIS — I10 Essential (primary) hypertension: Secondary | ICD-10-CM

## 2023-09-23 DIAGNOSIS — Z8673 Personal history of transient ischemic attack (TIA), and cerebral infarction without residual deficits: Secondary | ICD-10-CM

## 2023-09-23 IMAGING — MG MM DIGITAL SCREENING BILAT W/ TOMO AND CAD
6 of 12 series · 6 of 36 positions shown · non-contrast
Comparison: Previous exam(s).

CLINICAL DATA: Screening.

EXAM:
DIGITAL SCREENING BILATERAL MAMMOGRAM WITH TOMOSYNTHESIS AND CAD
TECHNIQUE: Bilateral screening digital craniocaudal and mediolateral oblique
mammograms were obtained. Bilateral screening digital breast
tomosynthesis was performed. The images were evaluated with
computer-aided detection.

[R CV synth-2D]
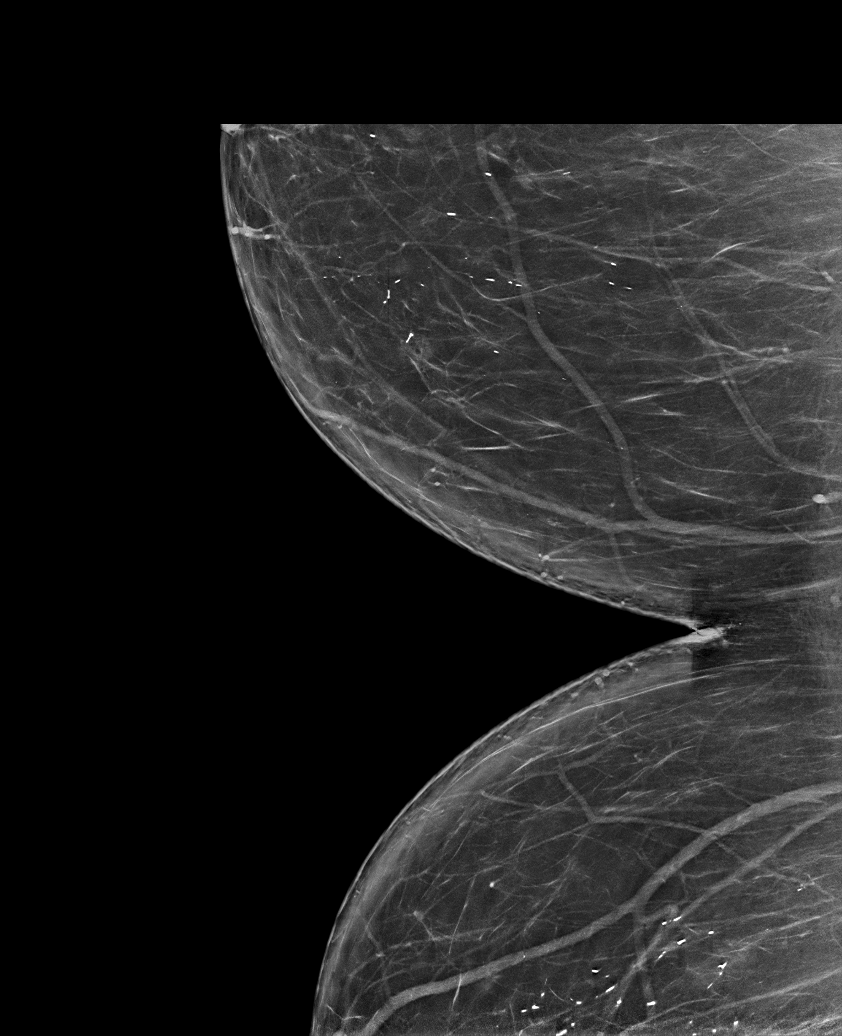

[R MLO synth-2D (1 of 2)]
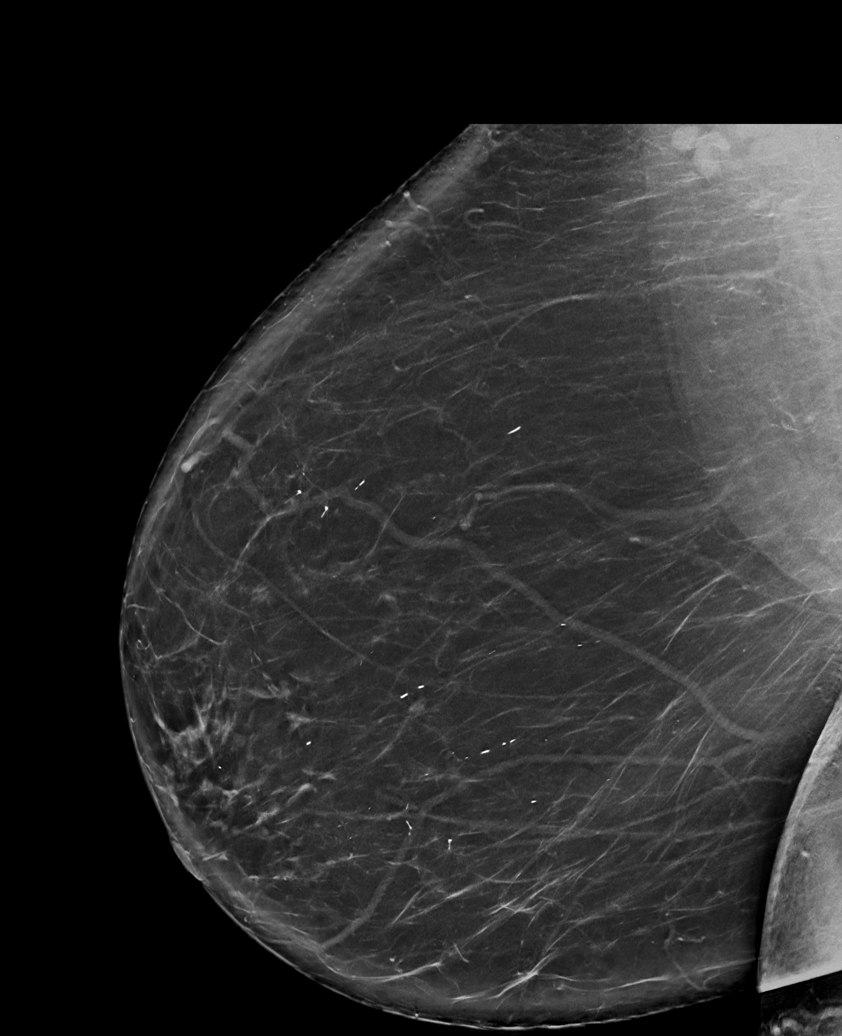

[R MLO synth-2D (2 of 2)]
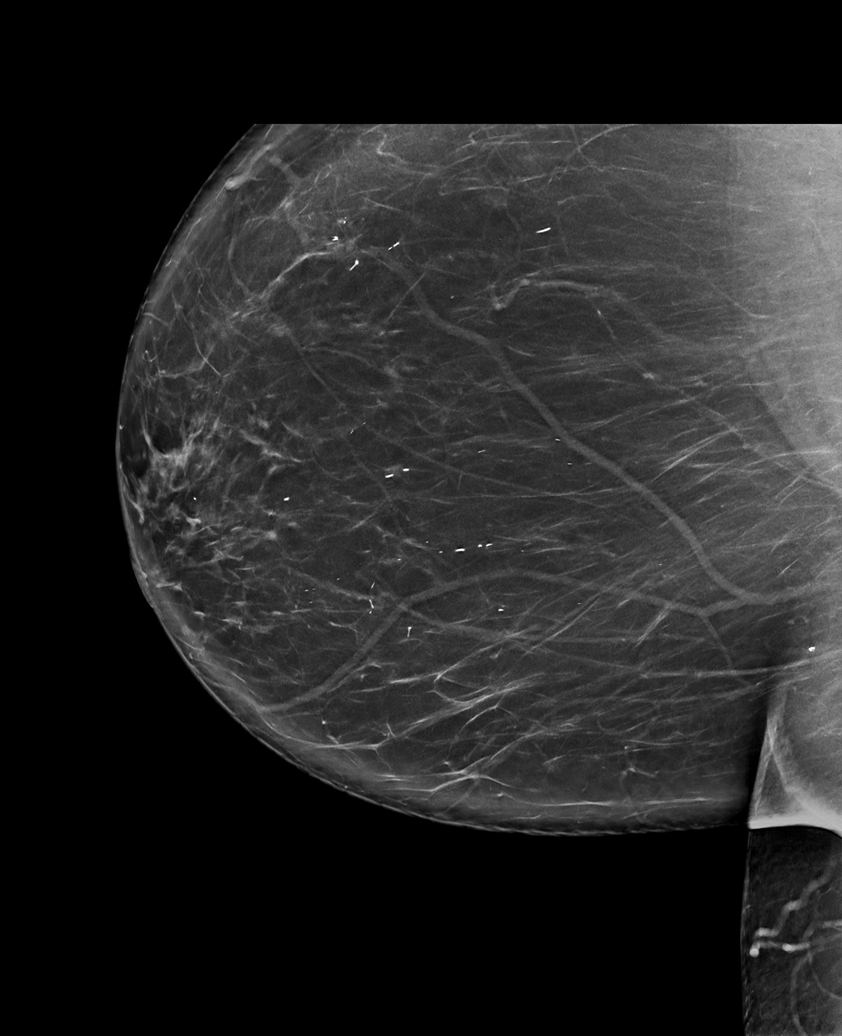

[R CC synth-2D]
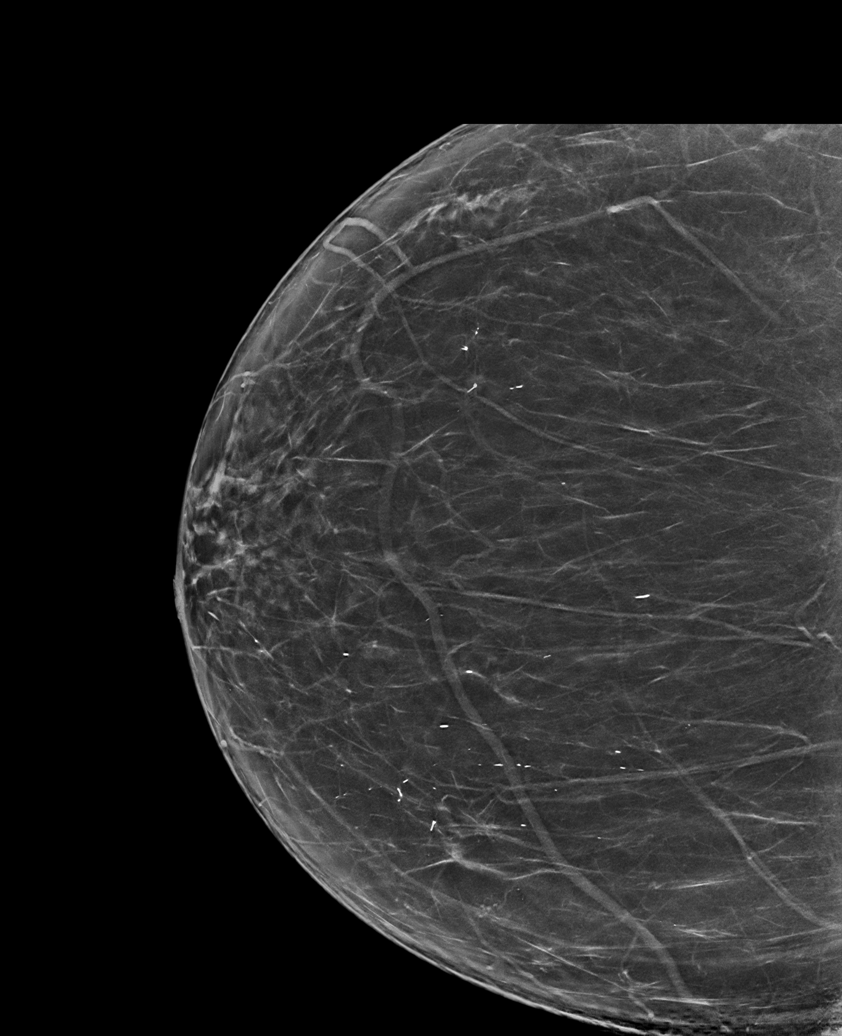

[L CC synth-2D]
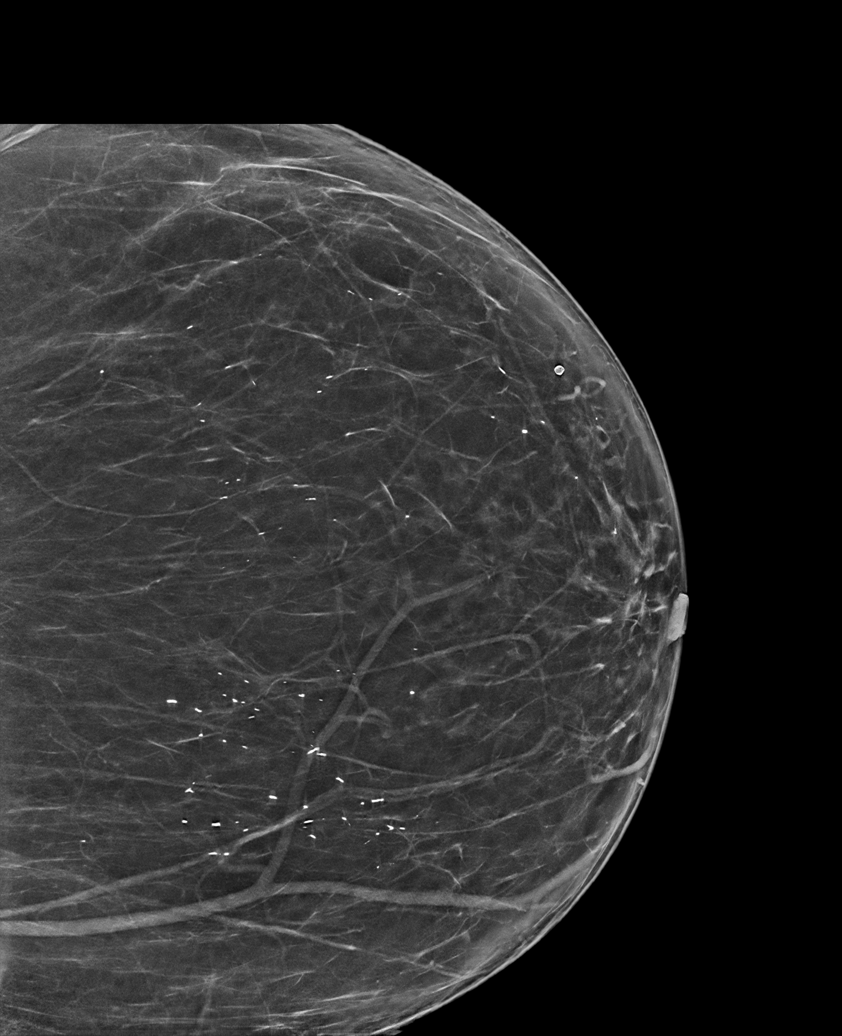

[L MLO synth-2D]
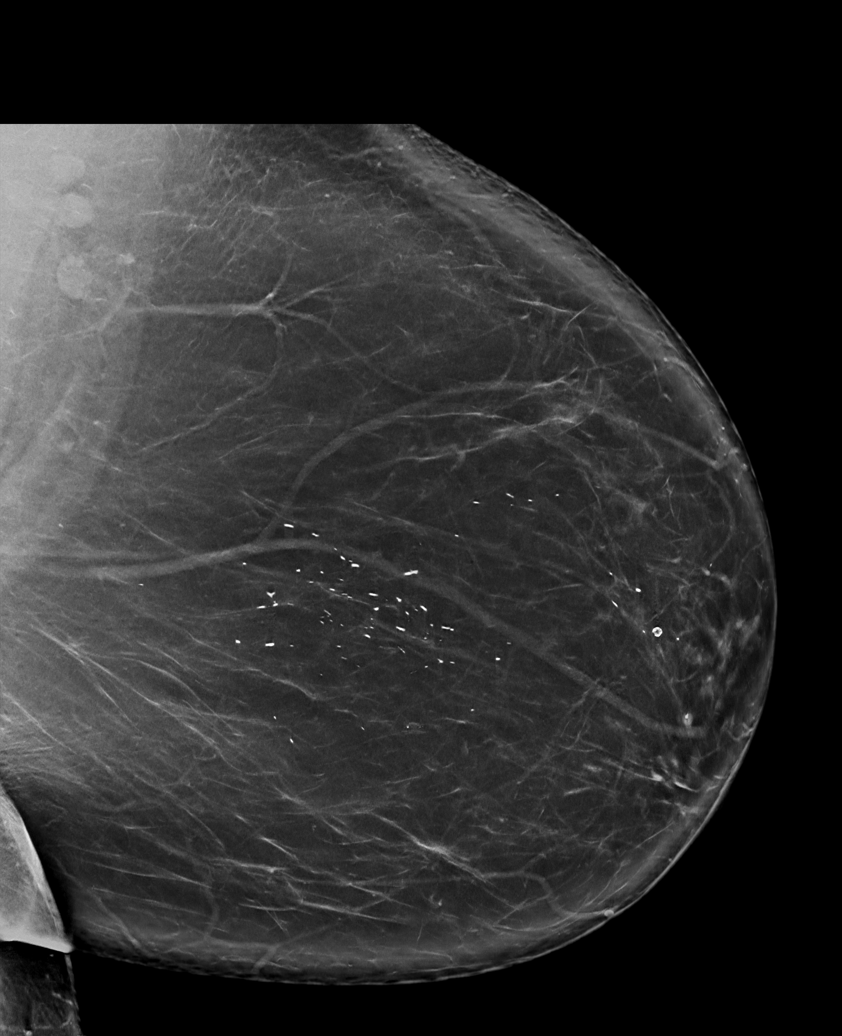

[6 of 36 positions shown; findings below may reference images not displayed]

ACR Breast Density Category b: There are scattered areas of
fibroglandular density.
FINDINGS: There are no findings suspicious for malignancy.
IMPRESSION: No mammographic evidence of malignancy. A result letter of this
screening mammogram will be mailed directly to the patient.

RECOMMENDATION:
Screening mammogram in one year. (Code:51-O-LD2)

BI-RADS CATEGORY  1: Negative.

## 2023-09-23 NOTE — Progress Notes (Signed)
Spoke with patient while she was in appointment with Dr. Raymondo Band today. Discussed physical exam finding on right sided deviation of her tongue on my exam earlier this week. Patient reports she is unsure if this was there prior. She reports some mild headaches but nothing like the one she felt when she had her stroke. Discussed obtaining imaging of brain given possible new deficit- patient expressed agreement. Given patients prior history of stroke with residual right sided deficits, suspect this is residual deficit which was not documented in prior encounters. However, will obtain MRI brain to r/o new abnormalities.

## 2023-09-23 NOTE — Patient Instructions (Addendum)
It was nice to see you today!  Your goal blood pressure is <130/80 mmHg.  Medication Changes: Continue all other medication the same.   Monitor blood pressure at home daily and keep a log (on your phone or piece of paper) to bring with you to your next visit. Write down date, time, blood pressure and pulse.  Keep up the good work with diet and exercise. Aim for a diet full of vegetables, fruit and lean meats (chicken, [place], fish). Try to limit salt intake by eating fresh or frozen vegetables (instead of canned), rinse canned vegetables prior to cooking and do not add any additional salt to meals.   TRY to cut back soda to diet [place] Dry ginger ale 1 can per day. You don't want to drink all your calories.

## 2023-09-23 NOTE — Assessment & Plan Note (Signed)
Hypertension diagnosed years ago currently nearly controlled on current medications. BP goal < 130/80 mmHg. Medication adherence appears good, but did have adherence issue with amlodipine for ~3 days. Overall, control is suboptimal due to recent medication nonadherence.  Stressed importance of adherence.  -Continued amlodipine 5 mg once daily, losartan-hydrochlorothiazide 50-12.5 mg once daily -Patient educated on purpose, proper use, and potential adverse effects.  -Counseled on lifestyle modifications for blood pressure control including reduced dietary sodium, increased exercise, adequate sleep.

## 2023-09-23 NOTE — Progress Notes (Signed)
Reviewed and agree with Dr Koval's plan.   

## 2023-09-23 NOTE — Progress Notes (Signed)
S:     Chief Complaint  Patient presents with   Medication Management    Blood Pressure Follow-up   57 y.o. female who presents for hypertension evaluation, education, and management.  PMH is significant for HTN,T2DM, HLD, Stroke.   Patient was last seen by Primary Care Provider, Dr. Georg Ruddle, on 09/17/2023.   At last visit, A1c of 6.0%, BP above goal at 160/104 retested to 145/100, continued all medications and restarted amlodipine.   Today, patient arrives in good spirits and presents without assistance. Denies dizziness, blurred vision. Denies headache today, but complains that she has headaches occasionally that she can hear. Reports occasional unilateral swelling. Reports no GI side effects of Mounjaro (tirzepatide). Patient complaining of spot on her tongue.   Patient reports hypertension was diagnosed in ~10 years.   Family/Social history: "everyone in the family"  Medication adherence good . Patient has taken BP medications today. She ran out of amlodipine for ~3 days, but got it refilled ~Monday. Reports sertraline (Zoloft) only about 3-4x weekly.  Current antihypertensives include: amlodipine 5 mg once daily, losartan-hydrochlorothiazide 50-12.5 mg once daily  Antihypertensives tried in the past include: Says yes, but cannot remember  Reported home BP readings: None Reported home BG readings: 97-134 mg/dL  Patient reported dietary habits: Eats 1-2 meals/day Breakfast: cereal, milk, hashbrowns Dinner: steak, chicken, fish, corn, green beans, collard beans, plums, peaches, grapes Snacks: "once in a blue moon" sweet treats  Drinks: water, [place] Dry ~2-3 small cans daily  Patient-reported exercise habits: walks around house  O:  Complaint of Spots on Tongue.  Physical Exam Vitals reviewed.  Constitutional:      Appearance: Normal appearance.  Pulmonary:     Effort: Pulmonary effort is normal.  Neurological:     Mental Status: She is alert.  Psychiatric:         Mood and Affect: Mood normal.        Behavior: Behavior normal.        Thought Content: Thought content normal.        Judgment: Judgment normal.    Last 3 Office BP readings: BP Readings from Last 3 Encounters:  09/17/23 (!) 145/100  05/05/23 112/70  03/22/23 (!) 126/90    BMET    Component Value Date/Time   NA 140 03/22/2023 1003   K 4.1 03/22/2023 1003   CL 100 03/22/2023 1003   CO2 24 03/22/2023 1003   GLUCOSE 88 03/22/2023 1003   GLUCOSE 154 (H) 09/20/2018 1019   BUN 15 03/22/2023 1003   CREATININE 0.96 03/22/2023 1003   CREATININE 1.01 06/11/2016 1620   CALCIUM 9.6 03/22/2023 1003   GFRNONAA 54 (L) 12/19/2020 1233   GFRNONAA 64 06/11/2016 1620   GFRAA 62 12/19/2020 1233   GFRAA 74 06/11/2016 1620    Clinical ASCVD: Yes  The ASCVD Risk score (Arnett DK, et al., 2019) failed to calculate for the following reasons:   The patient has a prior MI or stroke diagnosis  A/P: Hypertension diagnosed years ago currently nearly controlled on current medications. BP goal < 130/80 mmHg. Medication adherence appears good, but did have adherence issue with amlodipine for ~3 days. Overall, control is suboptimal due to recent medication nonadherence.  Stressed importance of adherence.  -Continued amlodipine 5 mg once daily, losartan-hydrochlorothiazide 50-12.5 mg once daily -Patient educated on purpose, proper use, and potential adverse effects.  -Counseled on lifestyle modifications for blood pressure control including reduced dietary sodium, increased exercise, adequate sleep.   Tongue Spot - Dr.  Baloch and Dr. Deirdre Priest evaluated tongue spotting. Discussed geographic tongue, and encouraged patient to monitor any tongue changes.  Results reviewed and written information provided.    Written patient instructions provided. Patient verbalized understanding of treatment plan.  Total time in face to face counseling 37 minutes.    Follow-up:  Pharmacist PRN. PCP clinic visit in  10/15/2023.  Patient seen with Shona Simpson, PharmD Candidate

## 2023-09-23 NOTE — Telephone Encounter (Signed)
Called patient to verify that she'd gotten message for upcoming MRI.  Patient states that she was notified via [facility].  Glennie Hawk, CMA

## 2023-09-29 DIAGNOSIS — H52223 Regular astigmatism, bilateral: Secondary | ICD-10-CM | POA: Diagnosis not present

## 2023-09-29 DIAGNOSIS — H5213 Myopia, bilateral: Secondary | ICD-10-CM | POA: Diagnosis not present

## 2023-09-29 DIAGNOSIS — H40023 Open angle with borderline findings, high risk, bilateral: Secondary | ICD-10-CM | POA: Diagnosis not present

## 2023-09-29 DIAGNOSIS — H524 Presbyopia: Secondary | ICD-10-CM | POA: Diagnosis not present

## 2023-09-29 DIAGNOSIS — Z135 Encounter for screening for eye and ear disorders: Secondary | ICD-10-CM | POA: Diagnosis not present

## 2023-09-30 ENCOUNTER — Ambulatory Visit (HOSPITAL_COMMUNITY)
Admission: RE | Admit: 2023-09-30 | Discharge: 2023-09-30 | Disposition: A | Discharge status: Home / Self Care | Payer: 59 | Source: Ambulatory Visit | Attending: Family Medicine | Admitting: Family Medicine

## 2023-09-30 DIAGNOSIS — Z8673 Personal history of transient ischemic attack (TIA), and cerebral infarction without residual deficits: Secondary | ICD-10-CM | POA: Insufficient documentation

## 2023-09-30 DIAGNOSIS — I6782 Cerebral ischemia: Secondary | ICD-10-CM | POA: Diagnosis not present

## 2023-09-30 DIAGNOSIS — I639 Cerebral infarction, unspecified: Secondary | ICD-10-CM | POA: Diagnosis not present

## 2023-10-13 ENCOUNTER — Encounter: Payer: Self-pay | Admitting: Pharmacist

## 2023-10-15 ENCOUNTER — Encounter: Payer: Self-pay | Admitting: Family Medicine

## 2023-10-15 ENCOUNTER — Ambulatory Visit: Payer: 59 | Admitting: Family Medicine

## 2023-10-15 VITALS — BP 130/80 | HR 71 | Ht 64.0 in | Wt 212.6 lb

## 2023-10-15 DIAGNOSIS — Z8673 Personal history of transient ischemic attack (TIA), and cerebral infarction without residual deficits: Secondary | ICD-10-CM | POA: Diagnosis not present

## 2023-10-15 DIAGNOSIS — Z23 Encounter for immunization: Secondary | ICD-10-CM

## 2023-10-15 DIAGNOSIS — F322 Major depressive disorder, single episode, severe without psychotic features: Secondary | ICD-10-CM

## 2023-10-15 DIAGNOSIS — E1169 Type 2 diabetes mellitus with other specified complication: Secondary | ICD-10-CM

## 2023-10-15 DIAGNOSIS — I10 Essential (primary) hypertension: Secondary | ICD-10-CM

## 2023-10-15 DIAGNOSIS — R7989 Other specified abnormal findings of blood chemistry: Secondary | ICD-10-CM | POA: Diagnosis not present

## 2023-10-15 DIAGNOSIS — G3184 Mild cognitive impairment, so stated: Secondary | ICD-10-CM | POA: Diagnosis not present

## 2023-10-15 MED ORDER — SERTRALINE HCL 50 MG PO TABS: 50.0000 mg | ORAL_TABLET | Freq: Every day | ORAL | 0 refills | Status: DC

## 2023-10-15 MED ORDER — FARXIGA 10 MG PO TABS: 10.0000 mg | ORAL_TABLET | Freq: Every day | ORAL | 2 refills | Status: DC

## 2023-10-15 MED ORDER — LOSARTAN POTASSIUM-HCTZ 50-12.5 MG PO TABS: 1.0000 | ORAL_TABLET | Freq: Every day | ORAL | 1 refills | Status: DC

## 2023-10-15 NOTE — Patient Instructions (Addendum)
It was great to see you today! Thank you for choosing [facility] for your primary care. Kathleen Larson was seen for follow up of blood pressure.  Today we addressed: Your blood pressure- it is well managed on your current regimen. We will continue your medications for now and plan to follow up in 3-6 months Your MRI- the results are still pending. I will give you a call when I get the results back. This can take anywhere from 1-4 weeks  If you haven't already, sign up for My Chart to have easy access to your labs results, and communication with your primary care physician.  We are checking some labs today. If they are abnormal, I will call you. If they are normal, I will send you a MyChart message (if it is active) or a letter in the mail. If you do not hear about your labs in the next 2 weeks, please call the office.   You should return to our clinic in 3-6 months for regular follow up   I recommend that you always bring your medications to each appointment as this makes it easy to ensure you are on the correct medications and helps [place] not miss refills when you need them.  Please arrive 15 minutes before your appointment to ensure smooth check in process.  We appreciate your efforts in making this happen.  Please call the clinic at [phone] if your symptoms worsen or you have any concerns.  Thank you for allowing me to participate in your care, Hal Morales, MD 10/15/2023, 1:54 PM PGY-1, [facility]

## 2023-10-15 NOTE — Assessment & Plan Note (Addendum)
Blood pressure was 130/80 today. Patient was well managed on amlodipine 5 mg daily and losartan-hydrochlorothiazide 50-12.5 mg.  - continue current dose - will plan to follow up in 3 months time

## 2023-10-15 NOTE — Progress Notes (Signed)
    SUBJECTIVE:   CHIEF COMPLAINT / HPI:  Patient is here for follow up for hypertension. She does not have any complaints at this time. Reports taking her medication regularly.   PERTINENT  PMH / PSH:  DMII - farxiga 10 mg, mounjaro 5mg   HLD - lipitor 80 mg  HTN - amlodpine 5 mg, hyzaar 50-12.5 Trochanteric bursitis- baclofen, naproxen MDD- sertraline Hx of stroke- aspirin   OBJECTIVE:   BP 130/80   Pulse 71   Ht 5\' 4"  (1.626 m)   Wt 212 lb 9.6 oz (96.4 kg)   LMP 10/14/2012   SpO2 99%   BMI 36.49 kg/m   General: A&O, NAD HEENT: No sign of trauma, EOM grossly intact Cardiac: RRR, no m/r/g Respiratory: CTAB, normal WOB, no w/c/r GI: Soft, NTTP, non-distended  Extremities: NTTP, no peripheral edema. Neuro:  A&OX3, memory intact Pupils equal reactive to light and extraocular movement intact Deviation of tongue to the right 5/5 left-sided shoulder shrug 4/5 right-sided shoulder shrug 5/5 left sided strength on left upper and lower extremities 4/5 right sided strength of right upper and lower extremities Gait: slow gait with decreased strength and ROM on R side  Psych: Appropriate mood and affect   ASSESSMENT/PLAN:   Essential hypertension, benign Blood pressure was 130/80 today. Patient was well managed on amlodipine 5 mg daily and losartan-hydrochlorothiazide 50-12.5 mg.  - continue current dose - will plan to follow up in 3 months time   History of stroke Patient on ASA and statin. Still has residual weakness on R side. Obtained MRI at last visit d/t neurological symptoms not documented previously. However, suspect these are chronic and just have not been documented. Patient reports she's at baseline. - MRI pending   Healthcare maintenance Received covid and flu vaccine   Hal Morales, MD [facility]

## 2023-10-15 NOTE — Assessment & Plan Note (Signed)
Patient on ASA and statin. Still has residual weakness on R side. Obtained MRI at last visit d/t neurological symptoms not documented previously. However, suspect these are chronic and just have not been documented. Patient reports she's at baseline. - MRI pending

## 2023-10-19 ENCOUNTER — Encounter: Payer: Self-pay | Admitting: Family Medicine

## 2023-10-20 ENCOUNTER — Ambulatory Visit: Payer: 59 | Admitting: Physical Therapy

## 2023-11-05 ENCOUNTER — Ambulatory Visit (INDEPENDENT_AMBULATORY_CARE_PROVIDER_SITE_OTHER): Payer: 59 | Admitting: Physical Therapy

## 2023-11-05 ENCOUNTER — Other Ambulatory Visit: Payer: Self-pay

## 2023-11-05 ENCOUNTER — Encounter: Payer: Self-pay | Admitting: Physical Therapy

## 2023-11-05 DIAGNOSIS — M79605 Pain in left leg: Secondary | ICD-10-CM

## 2023-11-05 DIAGNOSIS — M6281 Muscle weakness (generalized): Secondary | ICD-10-CM | POA: Diagnosis not present

## 2023-11-05 DIAGNOSIS — R262 Difficulty in walking, not elsewhere classified: Secondary | ICD-10-CM

## 2023-11-05 DIAGNOSIS — R29898 Other symptoms and signs involving the musculoskeletal system: Secondary | ICD-10-CM | POA: Diagnosis not present

## 2023-11-05 NOTE — Therapy (Signed)
OUTPATIENT PHYSICAL THERAPY LOWER EXTREMITY EVALUATION   Patient Name: Kathleen Larson MRN: [id number] DOB:20-Apr-1966, 57 y.o., female Today's Date: 11/05/2023  END OF SESSION:  PT End of Session - 11/05/23 1523     Visit Number 1    Number of Visits 13    Date for PT Re-Evaluation 12/17/23    Authorization Type [employer] MCR    Authorization Time Period 11/05/23 to 12/17/23    Progress Note Due on Visit 10    PT Start Time 1435    PT Stop Time 1515    PT Time Calculation (min) 40 min    Activity Tolerance Patient tolerated treatment well    Behavior During Therapy [facility] for tasks assessed/performed             Past Medical History:  Diagnosis Date   Abnormal EKG    Allergy    Back pain 10/16/2021   CONSTIPATION 03/22/2009   Qualifier: Diagnosis of  By: Daphine Deutscher FNP, Nykedtra     Diabetes mellitus without complication (HCC)    Epiploic appendagitis 09/26/2018   Esophageal dysphagia 03/10/2018   Essential hypertension    GERD (gastroesophageal reflux disease)    Greater trochanteric bursitis of right hip 05/26/2017   Grief 10/16/2021   Healthcare maintenance 08/14/2019   HELICOBACTER PYLORI INFECTION, HX OF 01/28/2009   Qualifier: Diagnosis of  By: Daphine Deutscher FNP, Nykedtra     Hyperlipidemia    Hypertension    Knee buckling, left 09/19/2020   Left knee pain 10/18/2020   Lesion of tongue 10/22/2017   LGSIL on Pap smear of cervix 08/14/2019   Medical non-compliance    Mild cognitive impairment 12/27/2017   Neck muscle strain 09/10/2016   Stroke (HCC)    CVA 2017   Type 2 diabetes mellitus with other specified complication (HCC) 06/21/2017   Complication is CVA   Yeast dermatitis 03/01/2019   Past Surgical History:  Procedure Laterality Date   TUBAL LIGATION     Patient Active Problem List   Diagnosis Date Noted   Trochanteric bursitis of left hip 05/05/2023   Sciatica of left side 03/23/2023   Vitreous floaters of both eyes 12/21/2020   Poor dentition 12/21/2020   Type 2  diabetes mellitus with diabetic neuropathy, unspecified (HCC) 03/15/2020   Major depressive disorder, single episode, severe (HCC) 03/10/2018   Mixed conductive and sensorineural hearing loss of both ears 10/22/2017   Tinnitus of both ears 06/21/2017   Gait disorder 03/25/2017   Essential hypertension, benign 05/27/2016   Intracranial vascular stenosis    History of stroke    Obesity    ALLERGIC RHINITIS 03/02/2008   Hyperlipemia, secondary prevention (LDL <70) 07/18/2007    PCP: Glendale Chard DO   REFERRING PROVIDER: Burley Saver MD   REFERRING DIAG:  Diagnosis  M70.62 (ICD-10-CM) - Trochanteric bursitis of left hip    THERAPY DIAG:  Pain in left leg  Muscle weakness (generalized)  Difficulty in walking, not elsewhere classified  Other symptoms and signs involving the musculoskeletal system  Rationale for Evaluation and Treatment: Rehabilitation  ONSET DATE: "I've been having problems"/chronic   SUBJECTIVE:   SUBJECTIVE STATEMENT:  I've been going downhill since I had my stroke in 2017, I've been dealing with this hip for awhile. Its hard for me to get up if I've been sitting for a long time. Got a shot in the hip that wore off, it was awhile ago. At worst it can go all the way to my toes and makes them  curl, no pattern to it but it doesn't last long.   PERTINENT HISTORY: See above  PAIN:  Are you having pain? Yes: NPRS scale: 8/10 now (non verbal communication and behavior not consistent with high pain levels); at worst can get to 10/10 Pain location: L hip and leg  Pain description: "I can't even describe it"  Aggravating factors: unclear  Relieving factors: unclear   PRECAUTIONS: Other: hx of CVA with R weakness   RED FLAGS: None   WEIGHT BEARING RESTRICTIONS: No  FALLS:  Has patient fallen in last 6 months? No  LIVING ENVIRONMENT: Lives with: lives alone Lives in: House/apartment Stairs: "I have little steps to get into the house"  Has following  equipment at home: Single point cane and Walker - 2 wheeled  OCCUPATION: disabled   PLOF: Independent, Independent with basic ADLs, Independent with gait, and Independent with transfers  PATIENT GOALS: get mobility back   NEXT MD VISIT: After PT/unclear   OBJECTIVE:  Note: Objective measures were completed at Evaluation unless otherwise noted.   PATIENT SURVEYS:  FOTO 41, predicted 47 in 12 visits   COGNITION: Overall cognitive status: No family/caregiver present to determine baseline cognitive functioning     SENSATION: WFL    MUSCLE LENGTH:  R HS/piriformis OK L HS/piriformis moderate limitation   POSTURE: rounded shoulders, forward head, decreased lumbar lordosis, and increased thoracic kyphosis      LOWER EXTREMITY MMT:  MMT Right eval Left eval  Hip flexion 2 4+  Hip extension    Hip abduction 2+ 3 painful   Hip adduction    Hip internal rotation    Hip external rotation    Knee flexion 3- 5  Knee extension 3 5  Ankle dorsiflexion 2+ 4+  Ankle plantarflexion    Ankle inversion    Ankle eversion     (Blank rows = not tested)   LUMBAR ROM  11/05/23  Lumbar flexion ROM mild limitation, RFIS back more painful  Lumbar extension ROM WNL, REIS improved sx  Lateral lumbar flexion WNL B   Possible extension bias        GAIT: Distance walked: in clinic distances  Assistive device utilized: None Level of assistance: Complete Independence Comments: limited trunk/hip rotation, limited ankle DF R LE/limited knee flexion, trendelenburg                                                                                                                                 TREATMENT DATE:   11/05/23  Exam, POC, HEP  Nustep L3 x6 minutes   L piriformis stretch 2x30 seconds L HS stretch 2x30 seconds Standing lumbar extensions x10    PATIENT EDUCATION:  Education details: exam findings, POC, HEP  Person educated: Patient Education method:  Programmer, multimedia, Demonstration, and Handouts Education comprehension: verbalized understanding, returned demonstration, and needs further education  HOME EXERCISE PROGRAM: Access Code: [email or web address] URL: [email or web address] Date: 11/05/2023 Prepared by: Nedra Hai  Exercises - Seated Figure 4 Piriformis Stretch  - 2 x daily - 7 x weekly - 1 sets - 2 reps - 30 seconds hold - Seated Hamstring Stretch  - 2 x daily - 7 x weekly - 1 sets - 2 reps - 30 seconds  hold - Standing Lumbar Extension  - 2 x daily - 7 x weekly - 1 sets - 10 reps  ASSESSMENT:  CLINICAL IMPRESSION: Patient is a 57 y.o. F who was seen today for physical therapy evaluation and treatment for  Diagnosis  M70.62 (ICD-10-CM) - Trochanteric bursitis of left hip  . She was a bit of an unclear historian but it sounds like this has been a problem for a fairly long time. Exam is suspicious for possible lumbar radiculopathy along with possible trochanteric bursitis due to chronic CVA related mobility impairments. Will make every effort to address objective impairments and work towards improving her mobility. Pain 6/10 at EOS, seemed to respond well to extension based programming.   OBJECTIVE IMPAIRMENTS: Abnormal gait, decreased mobility, difficulty walking, decreased strength, increased fascial restrictions, increased muscle spasms, impaired flexibility, postural dysfunction, and pain.   ACTIVITY LIMITATIONS: sitting, standing, squatting, stairs, transfers, and locomotion level  PARTICIPATION LIMITATIONS: driving, shopping, community activity, and occupation  PERSONAL FACTORS: Age, Behavior pattern, Education, Fitness, Past/current experiences, Social background, and Time since onset of injury/illness/exacerbation are also affecting patient's functional outcome.   REHAB POTENTIAL: Fair chronicity of impairments from CVA likely playing a large role in her symptoms   CLINICAL DECISION MAKING: Evolving/moderate  complexity  EVALUATION COMPLEXITY: Moderate   GOALS: Goals reviewed with patient? No  SHORT TERM GOALS: Target date: 11/26/2023     Will be compliant with appropriate progressive HEP  Baseline: Goal status: INITIAL  2.  L hip pain to be no more than 7/10 at worst  Baseline:  Goal status: INITIAL  3.  Will be able to complete all functional transfers without favoring L LE and without increased pain  Baseline:  Goal status: INITIAL  4.  Flexibility in L LE to have improved by at least 50% Baseline:  Goal status: INITIAL   LONG TERM GOALS: Target date: 12/17/2023    MMT to have improved by at least 1 grade in all weak groups  Baseline:  Goal status: INITIAL  2.  Pain to be no more than 4/10 in L LE  Baseline:  Goal status: INITIAL  3.  Will be able to ambulate community distances as desired without increase in pain from resting levels  Baseline:  Goal status: INITIAL  4.  FOTO score to be at or have exceeded predicted value by time of DC  Baseline:  Goal status: INITIAL    PLAN:  PT FREQUENCY: 2x/week  PT DURATION: 6 weeks  PLANNED INTERVENTIONS: 97164- PT Re-evaluation, 97110-Therapeutic exercises, 97530- Therapeutic activity, [id number]- Neuromuscular re-education, 97535- Self Care, [id number]- Manual therapy, [id number]- Aquatic Therapy, [phone]- Ionotophoresis 4mg /ml Dexamethasone, Taping, Dry Needling, Cryotherapy, and Moist heat  PLAN FOR NEXT SESSION: work on L LE flexibility, general strength and conditioning in context of R hemiparesis after old CVA. Possible lumbar radiculopathy with extension preference.   Nedra Hai, PT, DPT 11/05/23 3:27 PM   Date of referral: 09/17/23 Referring provider: Burley Saver MD  Referring diagnosis?  Diagnosis  M70.62 (ICD-10-CM) - Trochanteric bursitis of left hip   Treatment diagnosis? (if different than referring diagnosis)   M79.605  M62.81  R26.2  R29.898  What was this (referring dx) caused by? Ongoing  Issue  Nature of Condition: Chronic (continuous duration > 3 months)   Laterality: Lt  Current Functional Measure Score: FOTO 41, prec  Objective measurements identify impairments when they are compared to normal values, the uninvolved extremity, and prior level of function.  [x]  Yes  []  No  Objective assessment of functional ability: Moderate functional limitations   Briefly describe symptoms: constant pain in L LE that has occasional acute flares   How did symptoms start: Chronic symptoms with no clear MOI or event that started her pain- likely from chronic mobility impairments from CVA   Average pain intensity:  Last 24 hours: 7/10  Past week: 7/10  How often does the pt experience symptoms? Constantly  How much have the symptoms interfered with usual daily activities? Moderately  How has condition changed since care began at this facility? NA - initial visit  In general, how is the patients overall health? Fair   BACK PAIN (STarT Back Screening Tool) No

## 2023-11-09 ENCOUNTER — Encounter: Payer: Self-pay | Admitting: Physical Therapy

## 2023-11-09 ENCOUNTER — Ambulatory Visit (INDEPENDENT_AMBULATORY_CARE_PROVIDER_SITE_OTHER): Payer: 59 | Admitting: Physical Therapy

## 2023-11-09 DIAGNOSIS — R262 Difficulty in walking, not elsewhere classified: Secondary | ICD-10-CM | POA: Diagnosis not present

## 2023-11-09 DIAGNOSIS — R29898 Other symptoms and signs involving the musculoskeletal system: Secondary | ICD-10-CM

## 2023-11-09 DIAGNOSIS — M79605 Pain in left leg: Secondary | ICD-10-CM | POA: Diagnosis not present

## 2023-11-09 DIAGNOSIS — M6281 Muscle weakness (generalized): Secondary | ICD-10-CM

## 2023-11-09 NOTE — Therapy (Signed)
 OUTPATIENT PHYSICAL THERAPY LOWER EXTREMITY   Patient Name: Kathleen Larson MRN: [id number] DOB:24-Oct-1966, 57 y.o., female Today's Date: 11/09/2023  END OF SESSION:  PT End of Session - 11/09/23 1029     Visit Number 2    Number of Visits 13    Date for PT Re-Evaluation 12/17/23    Authorization Type UHC MCR    Authorization Time Period 11/05/23 to 12/17/23    Progress Note Due on Visit 10    PT Start Time 1015    PT Stop Time 1055    PT Time Calculation (min) 40 min    Activity Tolerance Patient tolerated treatment well    Behavior During Therapy [facility] for tasks assessed/performed              Past Medical History:  Diagnosis Date   Abnormal EKG    Allergy    Back pain 10/16/2021   CONSTIPATION 03/22/2009   Qualifier: Diagnosis of  By: Gladis FNP, Nykedtra     Diabetes mellitus without complication (HCC)    Epiploic appendagitis 09/26/2018   Esophageal dysphagia 03/10/2018   Essential hypertension    GERD (gastroesophageal reflux disease)    Greater trochanteric bursitis of right hip 05/26/2017   Grief 10/16/2021   Healthcare maintenance 08/14/2019   HELICOBACTER PYLORI INFECTION, HX OF 01/28/2009   Qualifier: Diagnosis of  By: Gladis FNP, Nykedtra     Hyperlipidemia    Hypertension    Knee buckling, left 09/19/2020   Left knee pain 10/18/2020   Lesion of tongue 10/22/2017   LGSIL on Pap smear of cervix 08/14/2019   Medical non-compliance    Mild cognitive impairment 12/27/2017   Neck muscle strain 09/10/2016   Stroke (HCC)    CVA 2017   Type 2 diabetes mellitus with other specified complication (HCC) 06/21/2017   Complication is CVA   Yeast dermatitis 03/01/2019   Past Surgical History:  Procedure Laterality Date   TUBAL LIGATION     Patient Active Problem List   Diagnosis Date Noted   Trochanteric bursitis of left hip 05/05/2023   Sciatica of left side 03/23/2023   Vitreous floaters of both eyes 12/21/2020   Poor dentition 12/21/2020   Type 2 diabetes  mellitus with diabetic neuropathy, unspecified (HCC) 03/15/2020   Major depressive disorder, single episode, severe (HCC) 03/10/2018   Mixed conductive and sensorineural hearing loss of both ears 10/22/2017   Tinnitus of both ears 06/21/2017   Gait disorder 03/25/2017   Essential hypertension, benign 05/27/2016   Intracranial vascular stenosis    History of stroke    Obesity    ALLERGIC RHINITIS 03/02/2008   Hyperlipemia, secondary prevention (LDL <70) 07/18/2007    PCP: Cleotilde Perkins DO   REFERRING PROVIDER: Donzetta Quant MD   REFERRING DIAG:  Diagnosis  M70.62 (ICD-10-CM) - Trochanteric bursitis of left hip    THERAPY DIAG:  Pain in left leg  Muscle weakness (generalized)  Difficulty in walking, not elsewhere classified  Other symptoms and signs involving the musculoskeletal system  Rationale for Evaluation and Treatment: Rehabilitation  ONSET DATE: I've been having problems/chronic   SUBJECTIVE:   SUBJECTIVE STATEMENT: Pt reporting arriving today with 8/10 pain in her left hip.     Eval: I've been going downhill since I had my stroke in 2017, I've been dealing with this hip for awhile. Its hard for me to get up if I've been sitting for a long time. Got a shot in the hip that wore off, it was awhile  ago. At worst it can go all the way to my toes and makes them curl, no pattern to it but it doesn't last long.   PERTINENT HISTORY: See above  PAIN:  Are you having pain? Yes: NPRS scale: 8/10 now (non verbal communication and behavior not consistent with high pain levels); at worst can get to 10/10 Pain location: L hip and leg  Pain description: I can't even describe it  Aggravating factors: unclear  Relieving factors: unclear   PRECAUTIONS: Other: hx of CVA with R weakness   RED FLAGS: None   WEIGHT BEARING RESTRICTIONS: No  FALLS:  Has patient fallen in last 6 months? No  LIVING ENVIRONMENT: Lives with: lives alone Lives in:  House/apartment Stairs: I have little steps to get into the house  Has following equipment at home: Single point cane and Environmental Consultant - 2 wheeled  OCCUPATION: disabled   PLOF: Independent, Independent with basic ADLs, Independent with gait, and Independent with transfers  PATIENT GOALS: get mobility back   NEXT MD VISIT: After PT/unclear   OBJECTIVE:  Note: Objective measures were completed at Evaluation unless otherwise noted.   PATIENT SURVEYS:  Eval: FOTO 41, predicted 47 in 12 visits   COGNITION: Overall cognitive status: No family/caregiver present to determine baseline cognitive functioning     SENSATION: WFL    MUSCLE LENGTH:  R HS/piriformis OK L HS/piriformis moderate limitation   POSTURE: rounded shoulders, forward head, decreased lumbar lordosis, and increased thoracic kyphosis      LOWER EXTREMITY MMT:  MMT Right eval Left eval  Hip flexion 2 4+  Hip extension    Hip abduction 2+ 3 painful   Hip adduction    Hip internal rotation    Hip external rotation    Knee flexion 3- 5  Knee extension 3 5  Ankle dorsiflexion 2+ 4+  Ankle plantarflexion    Ankle inversion    Ankle eversion     (Blank rows = not tested)   LUMBAR ROM  11/05/23  Lumbar flexion ROM mild limitation, RFIS back more painful  Lumbar extension ROM WNL, REIS improved sx  Lateral lumbar flexion WNL B   Possible extension bias        GAIT: Distance walked: in clinic distances  Assistive device utilized: None Level of assistance: Complete Independence Comments: limited trunk/hip rotation, limited ankle DF R LE/limited knee flexion, trendelenburg                                                                                                                                 TREATMENT DATE:  11/09/23:  TherEx:  Nustep: Level 4 x 8 minutes Standing lumbar extension at wall x 10 holding 10 sec Standing hip extension: 2 x 10 bil LE c UE support Standing mini squats 2 x  10 c UE support Bil piriformis stretch: 2 x 30 sec Bil hamstring stretch 3 x 30 sec using strap in supine Prone  on elbows x 2 minutes to pt's tolerance      11/05/23 Exam, POC, HEP  Nustep L3 x6 minutes   L piriformis stretch 2x30 seconds L HS stretch 2x30 seconds Standing lumbar extensions x10    PATIENT EDUCATION:  Education details: exam findings, POC, HEP  Person educated: Patient Education method: Programmer, Multimedia, Demonstration, and Handouts Education comprehension: verbalized understanding, returned demonstration, and needs further education  HOME EXERCISE PROGRAM: Access Code: [email or web address] URL: [email or web address] Date: 11/09/2023 Prepared by: Delon Lunger  Exercises - Seated Figure 4 Piriformis Stretch  - 2 x daily - 7 x weekly - 1 sets - 2 reps - 30 seconds hold - Seated Hamstring Stretch  - 2 x daily - 7 x weekly - 1 sets - 2 reps - 30 seconds  hold - Standing Lumbar Extension  - 2 x daily - 7 x weekly - 1 sets - 10 reps - Supine Bridge  - 2 x daily - 7 x weekly - 2 sets - 10 reps - 5 seconds hold - Supine Lower Trunk Rotation  - 2 x daily - 7 x weekly - 3 reps - 30 seconds hold  ASSESSMENT:  CLINICAL IMPRESSION: Pt arriving today in 8/10 pain in her low back. Pt tolerating exercises well with review of pt's HEP. Pt with good response to extension based exercises. Continue to progress pt's core strengthening and functional mobility with skilled Pt services.   OBJECTIVE IMPAIRMENTS: Abnormal gait, decreased mobility, difficulty walking, decreased strength, increased fascial restrictions, increased muscle spasms, impaired flexibility, postural dysfunction, and pain.   ACTIVITY LIMITATIONS: sitting, standing, squatting, stairs, transfers, and locomotion level  PARTICIPATION LIMITATIONS: driving, shopping, community activity, and occupation  PERSONAL FACTORS: Age, Behavior pattern, Education, Fitness, Past/current experiences, Social background, and  Time since onset of injury/illness/exacerbation are also affecting patient's functional outcome.   REHAB POTENTIAL: Fair chronicity of impairments from CVA likely playing a large role in her symptoms   CLINICAL DECISION MAKING: Evolving/moderate complexity  EVALUATION COMPLEXITY: Moderate   GOALS: Goals reviewed with patient? No  SHORT TERM GOALS: Target date: 11/26/2023     Will be compliant with appropriate progressive HEP  Baseline: Goal status: on-going 11/09/23  2.  L hip pain to be no more than 7/10 at worst  Baseline:  Goal status: INITIAL  3.  Will be able to complete all functional transfers without favoring L LE and without increased pain  Baseline:  Goal status: INITIAL  4.  Flexibility in L LE to have improved by at least 50% Baseline:  Goal status: INITIAL   LONG TERM GOALS: Target date: 12/17/2023    MMT to have improved by at least 1 grade in all weak groups  Baseline:  Goal status: INITIAL  2.  Pain to be no more than 4/10 in L LE  Baseline:  Goal status: INITIAL  3.  Will be able to ambulate community distances as desired without increase in pain from resting levels  Baseline:  Goal status: INITIAL  4.  FOTO score to be at or have exceeded predicted value by time of DC  Baseline:  Goal status: INITIAL    PLAN:  PT FREQUENCY: 2x/week  PT DURATION: 6 weeks  PLANNED INTERVENTIONS: 97164- PT Re-evaluation, 97110-Therapeutic exercises, 97530- Therapeutic activity, [id number]- Neuromuscular re-education, 97535- Self Care, [id number]- Manual therapy, [id number]- Aquatic Therapy, [phone]- Ionotophoresis 4mg /ml Dexamethasone, Taping, Dry Needling, Cryotherapy, and Moist heat  PLAN FOR NEXT SESSION: work on L LE flexibility, general strength and conditioning in context of R  hemiparesis after old CVA. Possible lumbar radiculopathy with extension preference.    Delon Lunger, PT, MPT 11/09/23 10:48 AM  11/09/23 10:48 AM       Date of referral:  09/17/23 Referring provider: Donzetta Quant MD  Referring diagnosis?  Diagnosis  M70.62 (ICD-10-CM) - Trochanteric bursitis of left hip   Treatment diagnosis? (if different than referring diagnosis)   M79.605  M62.81  R26.2  R29.898  What was this (referring dx) caused by? Ongoing Issue  Lysle of Condition: Chronic (continuous duration > 3 months)   Laterality: Lt  Current Functional Measure Score: FOTO 41, prec  Objective measurements identify impairments when they are compared to normal values, the uninvolved extremity, and prior level of function.  [x]  Yes  []  No  Objective assessment of functional ability: Moderate functional limitations   Briefly describe symptoms: constant pain in L LE that has occasional acute flares   How did symptoms start: Chronic symptoms with no clear MOI or event that started her pain- likely from chronic mobility impairments from CVA   Average pain intensity:  Last 24 hours: 7/10  Past week: 7/10  How often does the pt experience symptoms? Constantly  How much have the symptoms interfered with usual daily activities? Moderately  How has condition changed since care began at this facility? NA - initial visit  In general, how is the patients overall health? Fair   BACK PAIN (STarT Back Screening Tool) No

## 2023-11-11 ENCOUNTER — Ambulatory Visit (INDEPENDENT_AMBULATORY_CARE_PROVIDER_SITE_OTHER): Payer: 59 | Admitting: Rehabilitative and Restorative Service Providers"

## 2023-11-11 ENCOUNTER — Encounter: Payer: Self-pay | Admitting: Family Medicine

## 2023-11-11 ENCOUNTER — Encounter: Payer: Self-pay | Admitting: Rehabilitative and Restorative Service Providers"

## 2023-11-11 DIAGNOSIS — R29898 Other symptoms and signs involving the musculoskeletal system: Secondary | ICD-10-CM

## 2023-11-11 DIAGNOSIS — R262 Difficulty in walking, not elsewhere classified: Secondary | ICD-10-CM

## 2023-11-11 DIAGNOSIS — M79605 Pain in left leg: Secondary | ICD-10-CM | POA: Diagnosis not present

## 2023-11-11 DIAGNOSIS — M6281 Muscle weakness (generalized): Secondary | ICD-10-CM | POA: Diagnosis not present

## 2023-11-11 DIAGNOSIS — Z1231 Encounter for screening mammogram for malignant neoplasm of breast: Secondary | ICD-10-CM

## 2023-11-11 NOTE — Therapy (Signed)
 OUTPATIENT PHYSICAL THERAPY LOWER EXTREMITY   Patient Name: Kathleen Larson MRN: [id number] DOB:1966/10/09, 58 y.o., female Today's Date: 11/11/2023  END OF SESSION:  PT End of Session - 11/11/23 1827     Visit Number 3    Number of Visits 13    Date for PT Re-Evaluation 12/17/23    Authorization Type [employer] MCR    Authorization Time Period 11/05/23 to 12/17/23    Progress Note Due on Visit 10    PT Start Time 1145    PT Stop Time 1230    PT Time Calculation (min) 45 min    Activity Tolerance Patient tolerated treatment well;No increased pain;Patient limited by pain    Behavior During Therapy [facility] for tasks assessed/performed               Past Medical History:  Diagnosis Date   Abnormal EKG    Allergy    Back pain 10/16/2021   CONSTIPATION 03/22/2009   Qualifier: Diagnosis of  By: Gladis FNP, Kathleen Larson     Diabetes mellitus without complication (HCC)    Epiploic appendagitis 09/26/2018   Esophageal dysphagia 03/10/2018   Essential hypertension    GERD (gastroesophageal reflux disease)    Greater trochanteric bursitis of right hip 05/26/2017   Grief 10/16/2021   Healthcare maintenance 08/14/2019   HELICOBACTER PYLORI INFECTION, HX OF 01/28/2009   Qualifier: Diagnosis of  By: Gladis FNP, Kathleen Larson     Hyperlipidemia    Hypertension    Knee buckling, left 09/19/2020   Left knee pain 10/18/2020   Lesion of tongue 10/22/2017   LGSIL on Pap smear of cervix 08/14/2019   Medical non-compliance    Mild cognitive impairment 12/27/2017   Neck muscle strain 09/10/2016   Stroke [facility])    CVA 2017   Type 2 diabetes mellitus with other specified complication (HCC) 06/21/2017   Complication is CVA   Yeast dermatitis 03/01/2019   Past Surgical History:  Procedure Laterality Date   TUBAL LIGATION     Patient Active Problem List   Diagnosis Date Noted   Trochanteric bursitis of left hip 05/05/2023   Sciatica of left side 03/23/2023   Vitreous floaters of both eyes 12/21/2020   Poor  dentition 12/21/2020   Type 2 diabetes mellitus with diabetic neuropathy, unspecified (HCC) 03/15/2020   Major depressive disorder, single episode, severe (HCC) 03/10/2018   Mixed conductive and sensorineural hearing loss of both ears 10/22/2017   Tinnitus of both ears 06/21/2017   Gait disorder 03/25/2017   Essential hypertension, benign 05/27/2016   Intracranial vascular stenosis    History of stroke    Obesity    ALLERGIC RHINITIS 03/02/2008   Hyperlipemia, secondary prevention (LDL <70) 07/18/2007    PCP: Kathleen Perkins DO   REFERRING PROVIDER: Donzetta Quant MD   REFERRING DIAG:  Diagnosis  M70.62 (ICD-10-CM) - Trochanteric bursitis of left hip    THERAPY DIAG:  Pain in left leg  Muscle weakness (generalized)  Difficulty in walking, not elsewhere classified  Other symptoms and signs involving the musculoskeletal system  Rationale for Evaluation and Treatment: Rehabilitation  ONSET DATE: I've been having problems/chronic   SUBJECTIVE:   SUBJECTIVE STATEMENT: Kathleen Larson reports compliance with her home exercises.  She states her hip pain has been going on for a long time and she is willing to do what ever she can to try to improve.    Eval: I've been going downhill since I had my stroke in 2017, I've been dealing with this hip  for awhile. Its hard for me to get up if I've been sitting for a long time. Got a shot in the hip that wore off, it was awhile ago. At worst it can go all the way to my toes and makes them curl, no pattern to it but it doesn't last long.   PERTINENT HISTORY: See above  PAIN:  Are you having pain? Yes: NPRS scale:  8/10 now (non verbal communication and behavior not consistent with high pain levels); at worst can get to 10/10 Pain location: L hip and leg  Pain description: I can't even describe it  Aggravating factors: unclear  Relieving factors: unclear   PRECAUTIONS: Other: hx of CVA with R weakness   RED FLAGS: None   WEIGHT  BEARING RESTRICTIONS: No  FALLS:  Has patient fallen in last 6 months? No  LIVING ENVIRONMENT: Lives with: lives alone Lives in: House/apartment Stairs: I have little steps to get into the house  Has following equipment at home: Single point cane and Environmental Consultant - 2 wheeled  OCCUPATION: disabled   PLOF: Independent, Independent with basic ADLs, Independent with gait, and Independent with transfers  PATIENT GOALS: get mobility back   NEXT MD VISIT: After PT/unclear   OBJECTIVE:  Note: Objective measures were completed at Evaluation unless otherwise noted.   PATIENT SURVEYS:  Eval: FOTO 41, predicted 47 in 12 visits   COGNITION: Overall cognitive status: No family/caregiver present to determine baseline cognitive functioning     SENSATION: WFL    MUSCLE LENGTH:  R HS/piriformis OK L HS/piriformis moderate limitation   POSTURE: rounded shoulders, forward head, decreased lumbar lordosis, and increased thoracic kyphosis      LOWER EXTREMITY MMT:  MMT Right eval Left eval  Hip flexion 2 4+  Hip extension    Hip abduction 2+ 3 painful   Hip adduction    Hip internal rotation    Hip external rotation    Knee flexion 3- 5  Knee extension 3 5  Ankle dorsiflexion 2+ 4+  Ankle plantarflexion    Ankle inversion    Ankle eversion     (Blank rows = not tested)   LUMBAR ROM  11/05/23  Lumbar flexion ROM mild limitation, RFIS back more painful  Lumbar extension ROM WNL, REIS improved sx  Lateral lumbar flexion WNL B   Possible extension bias   GAIT: Distance walked: in clinic distances  Assistive device utilized: None Level of assistance: Complete Independence Comments: limited trunk/hip rotation, limited ankle DF R LE/limited knee flexion, trendelenburg                                                                                                                                 TREATMENT DATE:  11/11/2023 Seated figure 4 stretch (modified to supine due  to poor mechanics) 4 x 20 seconds bilateral Seated hamstrings stretch with good posture 4 x 20 seconds bilateral Lower trunk/pelvic rotation 3 x 30  seconds Bridging 10 x 5 seconds Standing lumbar extension (hips forward) 10 x 3 seconds Alternating hip hike at counter top (good posture) 2 sets of 10 3 seconds  Functional Activities: Golfer's and diagonal squat lifts for house chores, kitchen sink body mechanics for dishes Sit to stand with slow eccentrics 5 x hands PRN   11/09/23:  TherEx:  Nustep: Level 4 x 8 minutes Standing lumbar extension at wall x 10 holding 10 sec Standing hip extension: 2 x 10 bil LE c UE support Standing mini squats 2 x 10 c UE support Bil piriformis stretch: 2 x 30 sec Bil hamstring stretch 3 x 30 sec using strap in supine Prone on elbows x 2 minutes to pt's tolerance   11/05/23 Exam, POC, HEP  Nustep L3 x6 minutes   L piriformis stretch 2x30 seconds L HS stretch 2x30 seconds Standing lumbar extensions x10    PATIENT EDUCATION:  Education details: exam findings, POC, HEP  Person educated: Patient Education method: Programmer, Multimedia, Demonstration, and Handouts Education comprehension: verbalized understanding, returned demonstration, and needs further education  HOME EXERCISE PROGRAM: Access Code: [email or web address] URL: [email or web address] Date: 11/11/2023 Prepared by: Lamar Ivory  Exercises - Seated Figure 4 Piriformis Stretch  - 2 x daily - 1 x weekly - 1 sets - 4-5 reps - 30 seconds hold - Seated Hamstring Stretch  - 2 x daily - 7 x weekly - 1 sets - 4-5 reps - 30 seconds  hold - Standing Lumbar Extension  - 2 x daily - 1 x weekly - 1 sets - 10 reps - Supine Bridge  - 2 x daily - 7 x weekly - 2 sets - 10 reps - 5 seconds hold - Supine Lower Trunk Rotation  - 2 x daily - 7 x weekly - 3 reps - 30 seconds hold - Standing Lumbar Extension at Wall - Forearms  - 5 x daily - 7 x weekly - 1 sets - 5 reps - 3 seconds hold - Supine Figure 4  Piriformis Stretch  - 2 x daily - 7 x weekly - 1 sets - 4-5 reps - 20 seconds hold - Sit to Stand with Armchair  - 2 x daily - 7 x weekly - 1 sets - 5 reps - Standing Hip Hiking  - 1 x daily - 7 x weekly - 2 sets - 5 reps - 3 seconds hold   ASSESSMENT:  CLINICAL IMPRESSION: Goldy did a good job with her supervised physical therapy today.  She did particularly well with strength progressions advanced due to significant evidence of her leg weakness contributing to her poor body mechanics.  We spent quite a bit of time reviewing golfers lift and other techniques to avoid flexion with rotation while also working on core and hip strength and stability.  Deshawnda will certainly benefit from continued core and hip strength work to improve her standing and walking function for less pain and improved endurance with ADLs.  OBJECTIVE IMPAIRMENTS: Abnormal gait, decreased mobility, difficulty walking, decreased strength, increased fascial restrictions, increased muscle spasms, impaired flexibility, postural dysfunction, and pain.   ACTIVITY LIMITATIONS: sitting, standing, squatting, stairs, transfers, and locomotion level  PARTICIPATION LIMITATIONS: driving, shopping, community activity, and occupation  PERSONAL FACTORS: Age, Behavior pattern, Education, Fitness, Past/current experiences, Social background, and Time since onset of injury/illness/exacerbation are also affecting patient's functional outcome.   REHAB POTENTIAL: Fair chronicity of impairments from CVA likely playing a large role in her symptoms   CLINICAL DECISION MAKING: Evolving/moderate complexity  EVALUATION  COMPLEXITY: Moderate   GOALS: Goals reviewed with patient? No  SHORT TERM GOALS: Target date: 11/26/2023     Will be compliant with appropriate progressive HEP  Baseline: Goal status: Met 11/11/2023  2.  L hip pain to be no more than 7/10 at worst  Baseline:  Goal status: On Going 11/11/2023  3.  Will be able to  complete all functional transfers without favoring L LE and without increased pain  Baseline:  Goal status: On Going 11/11/2023  4.  Flexibility in L LE to have improved by at least 50% Baseline:  Goal status: INITIAL   LONG TERM GOALS: Target date: 12/17/2023    MMT to have improved by at least 1 grade in all weak groups  Baseline:  Goal status: INITIAL  2.  Pain to be no more than 4/10 in L LE  Baseline:  Goal status: INITIAL  3.  Will be able to ambulate community distances as desired without increase in pain from resting levels  Baseline:  Goal status: INITIAL  4.  FOTO score to be at or have exceeded predicted value by time of DC  Baseline:  Goal status: INITIAL    PLAN:  PT FREQUENCY: 2x/week  PT DURATION: 6 weeks  PLANNED INTERVENTIONS: 97164- PT Re-evaluation, 97110-Therapeutic exercises, 97530- Therapeutic activity, [id number]- Neuromuscular re-education, 97535- Self Care, [id number]- Manual therapy, [id number]- Aquatic Therapy, [phone]- Ionotophoresis 4mg /ml Dexamethasone, Taping, Dry Needling, Cryotherapy, and Moist heat  PLAN FOR NEXT SESSION: Work on L LE flexibility, general strength and conditioning in context of R hemiparesis and Lt sided overuse after an old CVA.  Possible lumbar radiculopathy with extension preference.    Myer LELON Ivory, PT, MPT 11/11/23 6:33 PM  11/11/23 6:33 PM       Date of referral: 09/17/23 Referring provider: Donzetta Quant MD  Referring diagnosis?  Diagnosis  M70.62 (ICD-10-CM) - Trochanteric bursitis of left hip   Treatment diagnosis? (if different than referring diagnosis)   M79.605  M62.81  R26.2  R29.898  What was this (referring dx) caused by? Ongoing Issue  Lysle of Condition: Chronic (continuous duration > 3 months)   Laterality: Lt  Current Functional Measure Score: FOTO 41, prec  Objective measurements identify impairments when they are compared to normal values, the uninvolved extremity, and prior level of  function.  [x]  Yes  []  No  Objective assessment of functional ability: Moderate functional limitations   Briefly describe symptoms: constant pain in L LE that has occasional acute flares   How did symptoms start: Chronic symptoms with no clear MOI or event that started her pain- likely from chronic mobility impairments from CVA   Average pain intensity:  Last 24 hours: 7/10  Past week: 7/10  How often does the pt experience symptoms? Constantly  How much have the symptoms interfered with usual daily activities? Moderately  How has condition changed since care began at this facility? NA - initial visit  In general, how is the patients overall health? Fair   BACK PAIN (STarT Back Screening Tool) No

## 2023-11-16 ENCOUNTER — Ambulatory Visit: Payer: 59 | Admitting: Rehabilitative and Restorative Service Providers"

## 2023-11-16 ENCOUNTER — Encounter: Payer: Self-pay | Admitting: Physical Therapy

## 2023-11-16 DIAGNOSIS — R29898 Other symptoms and signs involving the musculoskeletal system: Secondary | ICD-10-CM | POA: Diagnosis not present

## 2023-11-16 DIAGNOSIS — R262 Difficulty in walking, not elsewhere classified: Secondary | ICD-10-CM | POA: Diagnosis not present

## 2023-11-16 DIAGNOSIS — M6281 Muscle weakness (generalized): Secondary | ICD-10-CM

## 2023-11-16 DIAGNOSIS — M79605 Pain in left leg: Secondary | ICD-10-CM

## 2023-11-16 NOTE — Therapy (Signed)
 OUTPATIENT PHYSICAL THERAPY LOWER EXTREMITY   Patient Name: Kathleen Larson MRN: [id number] DOB:Dec 25, 1965, 58 y.o., female Today's Date: 11/16/2023  END OF SESSION:  PT End of Session - 11/16/23 1149     Visit Number 4    Number of Visits 13    Date for PT Re-Evaluation 12/17/23    Authorization Type [employer] MCR    Authorization Time Period 11/05/23 to 12/17/23    Progress Note Due on Visit 10    PT Start Time 1136    PT Stop Time 1215    PT Time Calculation (min) 39 min    Activity Tolerance Patient tolerated treatment well;No increased pain;Patient limited by pain    Behavior During Therapy [facility] for tasks assessed/performed                Past Medical History:  Diagnosis Date   Abnormal EKG    Allergy    Back pain 10/16/2021   CONSTIPATION 03/22/2009   Qualifier: Diagnosis of  By: Gladis FNP, Nykedtra     Diabetes mellitus without complication (HCC)    Epiploic appendagitis 09/26/2018   Esophageal dysphagia 03/10/2018   Essential hypertension    GERD (gastroesophageal reflux disease)    Greater trochanteric bursitis of right hip 05/26/2017   Grief 10/16/2021   Healthcare maintenance 08/14/2019   HELICOBACTER PYLORI INFECTION, HX OF 01/28/2009   Qualifier: Diagnosis of  By: Gladis FNP, Nykedtra     Hyperlipidemia    Hypertension    Knee buckling, left 09/19/2020   Left knee pain 10/18/2020   Lesion of tongue 10/22/2017   LGSIL on Pap smear of cervix 08/14/2019   Medical non-compliance    Mild cognitive impairment 12/27/2017   Neck muscle strain 09/10/2016   Stroke (HCC)    CVA 2017   Type 2 diabetes mellitus with other specified complication (HCC) 06/21/2017   Complication is CVA   Yeast dermatitis 03/01/2019   Past Surgical History:  Procedure Laterality Date   TUBAL LIGATION     Patient Active Problem List   Diagnosis Date Noted   Trochanteric bursitis of left hip 05/05/2023   Sciatica of left side 03/23/2023   Vitreous floaters of both eyes 12/21/2020    Poor dentition 12/21/2020   Type 2 diabetes mellitus with diabetic neuropathy, unspecified (HCC) 03/15/2020   Major depressive disorder, single episode, severe (HCC) 03/10/2018   Mixed conductive and sensorineural hearing loss of both ears 10/22/2017   Tinnitus of both ears 06/21/2017   Gait disorder 03/25/2017   Essential hypertension, benign 05/27/2016   Intracranial vascular stenosis    History of stroke    Obesity    ALLERGIC RHINITIS 03/02/2008   Hyperlipemia, secondary prevention (LDL <70) 07/18/2007    PCP: Cleotilde Perkins DO   REFERRING PROVIDER: Donzetta Quant MD   REFERRING DIAG:  Diagnosis  M70.62 (ICD-10-CM) - Trochanteric bursitis of left hip    THERAPY DIAG:  Pain in left leg  Muscle weakness (generalized)  Difficulty in walking, not elsewhere classified  Other symptoms and signs involving the musculoskeletal system  Rationale for Evaluation and Treatment: Rehabilitation  ONSET DATE: I've been having problems/chronic   SUBJECTIVE:   SUBJECTIVE STATEMENT: Pt reporting 7/10 pain in her low back today. Pt stating she has been working on her standing posture when washing dishes and lifting things from the floor.    Eval: I've been going downhill since I had my stroke in 2017, I've been dealing with this hip for awhile. Its hard for me  to get up if I've been sitting for a long time. Got a shot in the hip that wore off, it was awhile ago. At worst it can go all the way to my toes and makes them curl, no pattern to it but it doesn't last long.   PERTINENT HISTORY: See above  PAIN:  Are you having pain? Yes: NPRS scale:  7/10  Pain location: low back Pain description: I can't even describe it  Aggravating factors: prolonged sitting  Relieving factors: unclear   PRECAUTIONS: Other: hx of CVA with R weakness   RED FLAGS: None   WEIGHT BEARING RESTRICTIONS: No  FALLS:  Has patient fallen in last 6 months? No  LIVING ENVIRONMENT: Lives with: lives  alone Lives in: House/apartment Stairs: I have little steps to get into the house  Has following equipment at home: Single point cane and Environmental Consultant - 2 wheeled  OCCUPATION: disabled   PLOF: Independent, Independent with basic ADLs, Independent with gait, and Independent with transfers  PATIENT GOALS: get mobility back   NEXT MD VISIT: After PT/unclear   OBJECTIVE:  Note: Objective measures were completed at Evaluation unless otherwise noted.   PATIENT SURVEYS:  Eval: FOTO 41, predicted 47 in 12 visits   COGNITION: Overall cognitive status: No family/caregiver present to determine baseline cognitive functioning     SENSATION: WFL    MUSCLE LENGTH:  R HS/piriformis OK L HS/piriformis moderate limitation   POSTURE: rounded shoulders, forward head, decreased lumbar lordosis, and increased thoracic kyphosis      LOWER EXTREMITY MMT:  MMT Right eval Left eval  Hip flexion 2 4+  Hip extension    Hip abduction 2+ 3 painful   Hip adduction    Hip internal rotation    Hip external rotation    Knee flexion 3- 5  Knee extension 3 5  Ankle dorsiflexion 2+ 4+  Ankle plantarflexion    Ankle inversion    Ankle eversion     (Blank rows = not tested)   LUMBAR ROM  11/05/23  Lumbar flexion ROM mild limitation, RFIS back more painful  Lumbar extension ROM WNL, REIS improved sx  Lateral lumbar flexion WNL B   Possible extension bias   GAIT: Distance walked: in clinic distances  Assistive device utilized: None Level of assistance: Complete Independence Comments: limited trunk/hip rotation, limited ankle DF R LE/limited knee flexion, trendelenburg                                                                                                                                 TREATMENT DATE:  11/16/23:  TherEx:  Nustep: Level 4 x 6 minutes Standing lumbar extension at wall x 10 holding 10 sec Standing hip hikes: x 10 holding 3 sec Sit to stand instructions in  body mechanics to assist with lift off x 10 (noes over toes, toes behind knees) Bil piriformis stretch sitting: 2 x 30 sec Bil hamstring stretch  sitting:  2 x 30 sec using strap Seated clam shells c green TB  2 x 10  Seated on Green physio-ball Balance with core activation Marching 2 x 10  Opposite arm to leg c marching 2 x 10  Ant/posterior pelvic tilts x 10 c moderate verbal and tactile cues for technique    11/11/2023 Seated figure 4 stretch (modified to supine due to poor mechanics) 4 x 20 seconds bilateral Seated hamstrings stretch with good posture 4 x 20 seconds bilateral Lower trunk/pelvic rotation 3 x 30 seconds Bridging 10 x 5 seconds Standing lumbar extension (hips forward) 10 x 3 seconds Alternating hip hike at counter top (good posture) 2 sets of 10 3 seconds  Functional Activities: Golfer's and diagonal squat lifts for house chores, kitchen sink body mechanics for dishes Sit to stand with slow eccentrics 5 x hands PRN   11/09/23:  TherEx:  Nustep: Level 4 x 8 minutes Standing lumbar extension at wall x 10 holding 10 sec Standing hip extension: 2 x 10 bil LE c UE support Standing mini squats 2 x 10 c UE support Bil piriformis stretch: 2 x 30 sec Bil hamstring stretch 3 x 30 sec using strap in supine Prone on elbows x 2 minutes to pt's tolerance      PATIENT EDUCATION:  Education details: exam findings, POC, HEP  Person educated: Patient Education method: Programmer, Multimedia, Demonstration, and Handouts Education comprehension: verbalized understanding, returned demonstration, and needs further education  HOME EXERCISE PROGRAM: Access Code: [email or web address] URL: [email or web address] Date: 11/11/2023 Prepared by: Lamar Ivory  Exercises - Seated Figure 4 Piriformis Stretch  - 2 x daily - 1 x weekly - 1 sets - 4-5 reps - 30 seconds hold - Seated Hamstring Stretch  - 2 x daily - 7 x weekly - 1 sets - 4-5 reps - 30 seconds  hold - Standing Lumbar Extension   - 2 x daily - 1 x weekly - 1 sets - 10 reps - Supine Bridge  - 2 x daily - 7 x weekly - 2 sets - 10 reps - 5 seconds hold - Supine Lower Trunk Rotation  - 2 x daily - 7 x weekly - 3 reps - 30 seconds hold - Standing Lumbar Extension at Wall - Forearms  - 5 x daily - 7 x weekly - 1 sets - 5 reps - 3 seconds hold - Supine Figure 4 Piriformis Stretch  - 2 x daily - 7 x weekly - 1 sets - 4-5 reps - 20 seconds hold - Sit to Stand with Armchair  - 2 x daily - 7 x weekly - 1 sets - 5 reps - Standing Hip Hiking  - 1 x daily - 7 x weekly - 2 sets - 5 reps - 3 seconds hold   ASSESSMENT:  CLINICAL IMPRESSION: Pt stating she has been trying to work on her standing posture and lifting techniques that were taught last session. Pt stating she is able to see some improvements. Pt still reporting 7/10 pain with prolonged sitting. Pt tolerated exercises today for core strengthening and LE stretching. Recommend continued skilled PT interventions to maximize pt's function.   OBJECTIVE IMPAIRMENTS: Abnormal gait, decreased mobility, difficulty walking, decreased strength, increased fascial restrictions, increased muscle spasms, impaired flexibility, postural dysfunction, and pain.   ACTIVITY LIMITATIONS: sitting, standing, squatting, stairs, transfers, and locomotion level  PARTICIPATION LIMITATIONS: driving, shopping, community activity, and occupation  PERSONAL FACTORS: Age, Behavior pattern, Education, Fitness, Past/current experiences, Social background, and Time since onset of  injury/illness/exacerbation are also affecting patient's functional outcome.   REHAB POTENTIAL: Fair chronicity of impairments from CVA likely playing a large role in her symptoms   CLINICAL DECISION MAKING: Evolving/moderate complexity  EVALUATION COMPLEXITY: Moderate   GOALS: Goals reviewed with patient? No  SHORT TERM GOALS: Target date: 11/26/2023     Will be compliant with appropriate progressive HEP  Baseline: Goal  status: Met 11/11/2023  2.  L hip pain to be no more than 7/10 at worst  Baseline:  Goal status: On Going 11/11/2023  3.  Will be able to complete all functional transfers without favoring L LE and without increased pain  Baseline:  Goal status: On Going 11/11/2023  4.  Flexibility in L LE to have improved by at least 50% Baseline:  Goal status: INITIAL   LONG TERM GOALS: Target date: 12/17/2023    MMT to have improved by at least 1 grade in all weak groups  Baseline:  Goal status: INITIAL  2.  Pain to be no more than 4/10 in L LE  Baseline:  Goal status: INITIAL  3.  Will be able to ambulate community distances as desired without increase in pain from resting levels  Baseline:  Goal status: INITIAL  4.  FOTO score to be at or have exceeded predicted value by time of DC  Baseline:  Goal status: INITIAL    PLAN:  PT FREQUENCY: 2x/week  PT DURATION: 6 weeks  PLANNED INTERVENTIONS: 97164- PT Re-evaluation, 97110-Therapeutic exercises, 97530- Therapeutic activity, [id number]- Neuromuscular re-education, 97535- Self Care, [id number]- Manual therapy, [id number]- Aquatic Therapy, [phone]- Ionotophoresis 4mg /ml Dexamethasone, Taping, Dry Needling, Cryotherapy, and Moist heat  PLAN FOR NEXT SESSION: Work on L LE flexibility, general strength and conditioning in context of R hemiparesis and Lt sided overuse after an old CVA.  Possible lumbar radiculopathy with extension preference.   Delon Lunger, PT, MPT 11/16/23 11:52 AM   11/16/23 11:52 AM  11/16/23 11:52 AM       Date of referral: 09/17/23 Referring provider: Donzetta Quant MD  Referring diagnosis?  Diagnosis  M70.62 (ICD-10-CM) - Trochanteric bursitis of left hip   Treatment diagnosis? (if different than referring diagnosis)   M79.605  M62.81  R26.2  R29.898  What was this (referring dx) caused by? Ongoing Issue  Lysle of Condition: Chronic (continuous duration > 3 months)   Laterality: Lt  Current Functional  Measure Score: FOTO 41, prec  Objective measurements identify impairments when they are compared to normal values, the uninvolved extremity, and prior level of function.  [x]  Yes  []  No  Objective assessment of functional ability: Moderate functional limitations   Briefly describe symptoms: constant pain in L LE that has occasional acute flares   How did symptoms start: Chronic symptoms with no clear MOI or event that started her pain- likely from chronic mobility impairments from CVA   Average pain intensity:  Last 24 hours: 7/10  Past week: 7/10  How often does the pt experience symptoms? Constantly  How much have the symptoms interfered with usual daily activities? Moderately  How has condition changed since care began at this facility? NA - initial visit  In general, how is the patients overall health? Fair   BACK PAIN (STarT Back Screening Tool) No

## 2023-11-17 ENCOUNTER — Other Ambulatory Visit: Payer: Self-pay | Admitting: Student

## 2023-11-17 DIAGNOSIS — K219 Gastro-esophageal reflux disease without esophagitis: Secondary | ICD-10-CM

## 2023-11-18 ENCOUNTER — Ambulatory Visit (INDEPENDENT_AMBULATORY_CARE_PROVIDER_SITE_OTHER): Payer: 59 | Admitting: Rehabilitative and Restorative Service Providers"

## 2023-11-18 ENCOUNTER — Encounter: Payer: Self-pay | Admitting: Rehabilitative and Restorative Service Providers"

## 2023-11-18 DIAGNOSIS — M79605 Pain in left leg: Secondary | ICD-10-CM | POA: Diagnosis not present

## 2023-11-18 DIAGNOSIS — M6281 Muscle weakness (generalized): Secondary | ICD-10-CM | POA: Diagnosis not present

## 2023-11-18 DIAGNOSIS — R262 Difficulty in walking, not elsewhere classified: Secondary | ICD-10-CM

## 2023-11-18 DIAGNOSIS — R29898 Other symptoms and signs involving the musculoskeletal system: Secondary | ICD-10-CM | POA: Diagnosis not present

## 2023-11-18 NOTE — Therapy (Signed)
 OUTPATIENT PHYSICAL THERAPY LOWER EXTREMITY   Patient Name: Kathleen Larson MRN: [id number] DOB:May 12, 1966, 58 y.o., female Today's Date: 11/18/2023  END OF SESSION:  PT End of Session - 11/18/23 1657     Visit Number 5    Number of Visits 13    Date for PT Re-Evaluation 12/17/23    Authorization Type [employer] MCR    Authorization Time Period 11/05/23 to 12/17/23    Progress Note Due on Visit 10    PT Start Time 1147    PT Stop Time 1232    PT Time Calculation (min) 45 min    Activity Tolerance Patient tolerated treatment well;No increased pain;Patient limited by pain    Behavior During Therapy [facility] for tasks assessed/performed               Past Medical History:  Diagnosis Date   Abnormal EKG    Allergy    Back pain 10/16/2021   CONSTIPATION 03/22/2009   Qualifier: Diagnosis of  By: Kathleen Larson, Kathleen Larson     Diabetes mellitus without complication (HCC)    Epiploic appendagitis 09/26/2018   Esophageal dysphagia 03/10/2018   Essential hypertension    GERD (gastroesophageal reflux disease)    Greater trochanteric bursitis of right hip 05/26/2017   Grief 10/16/2021   Healthcare maintenance 08/14/2019   HELICOBACTER PYLORI INFECTION, HX OF 01/28/2009   Qualifier: Diagnosis of  By: Kathleen Larson, Kathleen Larson     Hyperlipidemia    Hypertension    Knee buckling, left 09/19/2020   Left knee pain 10/18/2020   Lesion of tongue 10/22/2017   LGSIL on Pap smear of cervix 08/14/2019   Medical non-compliance    Mild cognitive impairment 12/27/2017   Neck muscle strain 09/10/2016   Stroke [facility])    CVA 2017   Type 2 diabetes mellitus with other specified complication (HCC) 06/21/2017   Complication is CVA   Yeast dermatitis 03/01/2019   Past Surgical History:  Procedure Laterality Date   TUBAL LIGATION     Patient Active Problem List   Diagnosis Date Noted   Trochanteric bursitis of left hip 05/05/2023   Sciatica of left side 03/23/2023   Vitreous floaters of both eyes 12/21/2020   Poor  dentition 12/21/2020   Type 2 diabetes mellitus with diabetic neuropathy, unspecified (HCC) 03/15/2020   Major depressive disorder, single episode, severe (HCC) 03/10/2018   Mixed conductive and sensorineural hearing loss of both ears 10/22/2017   Tinnitus of both ears 06/21/2017   Gait disorder 03/25/2017   Essential hypertension, benign 05/27/2016   Intracranial vascular stenosis    History of stroke    Obesity    ALLERGIC RHINITIS 03/02/2008   Hyperlipemia, secondary prevention (LDL <70) 07/18/2007    PCP: Kathleen Larson   REFERRING PROVIDER: Donzetta Quant Larson   REFERRING DIAG:  Diagnosis  M70.62 (ICD-10-CM) - Trochanteric bursitis of left hip    THERAPY DIAG:  Pain in left leg  Muscle weakness (generalized)  Difficulty in walking, not elsewhere classified  Other symptoms and signs involving the musculoskeletal system  Rationale for Evaluation and Treatment: Rehabilitation  ONSET DATE: I've been having problems/chronic   SUBJECTIVE:   SUBJECTIVE STATEMENT: Kathleen Larson rates her pain as 4-8/10 this week.  Mornings are the worst.  Things get a bit better getting up and getting some tylenol .   Eval: I've been going downhill since I had my stroke in 2017, I've been dealing with this hip for awhile. Its hard for me to get up if I've  been sitting for a long time. Got a shot in the hip that wore off, it was awhile ago. At worst it can go all the way to my toes and makes them curl, no pattern to it but it doesn't last long.   PERTINENT HISTORY: See above  PAIN:  Are you having pain? Yes: NPRS scale:  4-8/10 this week  Pain location: low back Pain description: ache, can spasm and be sharp  Aggravating factors: prolonged sitting, prolonged postures, with fatigue  Relieving factors: change of position, extension exercises   PRECAUTIONS: Other: hx of CVA with R weakness   RED FLAGS: None   WEIGHT BEARING RESTRICTIONS: No  FALLS:  Has patient fallen in last 6  months? No  LIVING ENVIRONMENT: Lives with: lives alone Lives in: House/apartment Stairs: I have little steps to get into the house  Has following equipment at home: Single point cane and Environmental Consultant - 2 wheeled  OCCUPATION: disabled   PLOF: Independent, Independent with basic ADLs, Independent with gait, and Independent with transfers  PATIENT GOALS: get mobility back   NEXT Larson VISIT: After PT/unclear   OBJECTIVE:  Note: Objective measures were completed at Evaluation unless otherwise noted.   PATIENT SURVEYS:  Eval: FOTO 41, predicted 47 in 12 visits   COGNITION: Overall cognitive status: No family/caregiver present to determine baseline cognitive functioning     SENSATION: WFL    MUSCLE LENGTH:  R HS/piriformis OK L HS/piriformis moderate limitation   POSTURE: rounded shoulders, forward head, decreased lumbar lordosis, and increased thoracic kyphosis  FLEXIBILITY (Lt/Rt in degrees): 11/18/2023 Hip flexors: 85/80 Hamstrings: 30/30 Hip IR:  13/16 Hip ER: 15/16   LOWER EXTREMITY MMT:  MMT Right eval Left eval  Hip flexion 2 4+  Hip extension    Hip abduction 2+ 3 painful   Hip adduction    Hip internal rotation    Hip external rotation    Knee flexion 3- 5  Knee extension 3 5  Ankle dorsiflexion 2+ 4+  Ankle plantarflexion    Ankle inversion    Ankle eversion     (Blank rows = not tested)   LUMBAR ROM  11/05/23  Lumbar flexion ROM mild limitation, RFIS back more painful  Lumbar extension ROM WNL, REIS improved sx  Lateral lumbar flexion WNL B   Possible extension bias   GAIT: Distance walked: in clinic distances  Assistive device utilized: None Level of assistance: Complete Independence Comments: limited trunk/hip rotation, limited ankle DF R LE/limited knee flexion, trendelenburg                                                                                                                                 TREATMENT DATE:   11/18/2023 Figure 4 stretch (push knee away) supine 4 x 20 seconds bilateral Supine hamstrings stretch other leg straight 4 x 20 seconds Bridging 10 x 5 seconds Single knee to chest with other leg straight 4 x  20 seconds bilateral Standing lumbar extension (hips forward) 10 x 3 seconds Alternating hip hike at counter top (good posture) 2 sets of 10 3 seconds  Functional Activities: Reviewed body mechanics for golfers lift, log roll for bed mobility, recommendations for vacuuming, doing dishes, reinforced the importance of avoiding flexed postures   11/16/23:  TherEx:  Nustep: Level 4 x 6 minutes Standing lumbar extension at wall x 10 holding 10 sec Standing hip hikes: x 10 holding 3 sec Sit to stand instructions in body mechanics to assist with lift off x 10 (noes over toes, toes behind knees) Bil piriformis stretch sitting: 2 x 30 sec Bil hamstring stretch sitting:  2 x 30 sec using strap Seated clam shells c green TB  2 x 10  Seated on Green physio-ball Balance with core activation Marching 2 x 10  Opposite arm to leg c marching 2 x 10  Ant/posterior pelvic tilts x 10 c moderate verbal and tactile cues for technique   11/11/2023 Seated figure 4 stretch (modified to supine due to poor mechanics) 4 x 20 seconds bilateral Seated hamstrings stretch with good posture 4 x 20 seconds bilateral Lower trunk/pelvic rotation 3 x 30 seconds Bridging 10 x 5 seconds Standing lumbar extension (hips forward) 10 x 3 seconds Alternating hip hike at counter top (good posture) 2 sets of 10 3 seconds  Functional Activities: Golfer's and diagonal squat lifts for house chores, kitchen sink body mechanics for dishes Sit to stand with slow eccentrics 5 x hands PRN    PATIENT EDUCATION:  Education details: exam findings, POC, HEP  Person educated: Patient Education method: Programmer, Multimedia, Demonstration, and Handouts Education comprehension: verbalized understanding, returned demonstration, and needs  further education  HOME EXERCISE PROGRAM: Access Code: [email or web address] URL: [email or web address] Date: 11/18/2023 Prepared by: Lamar Ivory  Exercises - Seated Figure 4 Piriformis Stretch  - 2 x daily - 1-7 x weekly - 1 sets - 4-5 reps - 30 seconds hold - Seated Hamstring Stretch  - 2 x daily - 1-7 x weekly - 1 sets - 4-5 reps - 30 seconds  hold - Standing Lumbar Extension  - 2 x daily - 1 x weekly - 1 sets - 10 reps - Supine Bridge  - 2 x daily - 7 x weekly - 2 sets - 10 reps - 5 seconds hold - Supine Lower Trunk Rotation  - 2 x daily - 1-7 x weekly - 3 reps - 30 seconds hold - Standing Lumbar Extension at Wall - Forearms  - 5 x daily - 7 x weekly - 1 sets - 5 reps - 3 seconds hold - Supine Figure 4 Piriformis Stretch  - 2 x daily - 7 x weekly - 1 sets - 4-5 reps - 20 seconds hold - Sit to Stand with Armchair  - 2 x daily - 7 x weekly - 1 sets - 5 reps - Standing Hip Hiking  - 3 x daily - 7 x weekly - 1 sets - 10 reps - 3 seconds hold - Supine Hamstring Stretch  - 2 x daily - 7 x weekly - 1 sets - 4-5 reps - 20 seconds hold - Single Knee to Chest Stretch  - 2 x daily - 7 x weekly - 1 sets - 4-5 reps - 20 seconds hold   ASSESSMENT:  CLINICAL IMPRESSION: 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  She has very  tight hips bilaterally and activities were added or modified to address this.  Mallorie will also benefit from continued leg and low back strength progressions to meet all long-term goals.  OBJECTIVE IMPAIRMENTS: Abnormal gait, decreased mobility, difficulty walking, decreased strength, increased fascial restrictions, increased muscle spasms, impaired flexibility, postural dysfunction, and pain.   ACTIVITY LIMITATIONS: sitting, standing, squatting, stairs, transfers, and locomotion level  PARTICIPATION LIMITATIONS: driving, shopping, community  activity, and occupation  PERSONAL FACTORS: Age, Behavior pattern, Education, Fitness, Past/current experiences, Social background, and Time since onset of injury/illness/exacerbation are also affecting patient's functional outcome.   REHAB POTENTIAL: Fair chronicity of impairments from CVA likely playing a large role in her symptoms   CLINICAL DECISION MAKING: Evolving/moderate complexity  EVALUATION COMPLEXITY: Moderate   GOALS: Goals reviewed with patient? No  SHORT TERM GOALS: Target date: 11/26/2023     Will be compliant with appropriate progressive HEP  Baseline: Goal status: Met 11/11/2023  2.  L hip pain to be no more than 7/10 at worst  Baseline:  Goal status: On Going 11/18/2023  3.  Will be able to complete all functional transfers without favoring L LE and without increased pain  Baseline:  Goal status: Partially Met 11/18/2023  4.  Flexibility in L LE to have improved by at least 50% Baseline:  Goal status: INITIAL   LONG TERM GOALS: Target date: 12/17/2023    MMT to have improved by at least 1 grade in all weak groups  Baseline:  Goal status: INITIAL  2.  Pain to be no more than 4/10 in L LE  Baseline:  Goal status: INITIAL  3.  Will be able to ambulate community distances as desired without increase in pain from resting levels  Baseline:  Goal status: INITIAL  4.  FOTO score to be at or have exceeded predicted value by time of DC  Baseline:  Goal status: INITIAL    PLAN:  PT FREQUENCY: 2x/week  PT DURATION: 6 weeks  PLANNED INTERVENTIONS: 97164- PT Re-evaluation, 97110-Therapeutic exercises, 97530- Therapeutic activity, [id number]- Neuromuscular re-education, 97535- Self Care, [id number]- Manual therapy, [id number]- Aquatic Therapy, [phone]- Ionotophoresis 4mg /ml Dexamethasone, Taping, Dry Needling, Cryotherapy, and Moist heat  PLAN FOR NEXT SESSION: Work on Bil LE flexibility, leg and low back strength and conditioning in context of R hemiparesis and Lt sided  overuse after an old CVA.  Lumbar radiculopathy with extension preference.   Myer LELON Ivory, PT, MPT 11/18/23 5:06 PM   11/18/23 5:06 PM  11/18/23 5:06 PM       Date of referral: 09/17/23 Referring provider: Donzetta Quant Larson  Referring diagnosis?  Diagnosis  M70.62 (ICD-10-CM) - Trochanteric bursitis of left hip   Treatment diagnosis? (if different than referring diagnosis)   M79.605  M62.81  R26.2  R29.898  What was this (referring dx) caused by? Ongoing Issue  Lysle of Condition: Chronic (continuous duration > 3 months)   Laterality: Lt  Current Functional Measure Score: FOTO 41, prec  Objective measurements identify impairments when they are compared to normal values, the uninvolved extremity, and prior level of function.  [x]  Yes  []  No  Objective assessment of functional ability: Moderate functional limitations   Briefly describe symptoms: constant pain in L LE that has occasional acute flares   How did symptoms start: Chronic symptoms with no clear MOI or event that started her pain- likely from chronic mobility impairments from CVA   Average pain intensity:  Last 24 hours: 7/10  Past week: 7/10  How often does  the pt experience symptoms? Constantly  How much have the symptoms interfered with usual daily activities? Moderately  How has condition changed since care began at this facility? NA - initial visit  In general, how is the patients overall health? Fair   BACK PAIN (STarT Back Screening Tool) No

## 2023-11-23 ENCOUNTER — Encounter: Payer: Self-pay | Admitting: Physical Therapy

## 2023-11-23 ENCOUNTER — Ambulatory Visit (INDEPENDENT_AMBULATORY_CARE_PROVIDER_SITE_OTHER): Payer: 59 | Admitting: Physical Therapy

## 2023-11-23 DIAGNOSIS — R29898 Other symptoms and signs involving the musculoskeletal system: Secondary | ICD-10-CM | POA: Diagnosis not present

## 2023-11-23 DIAGNOSIS — R262 Difficulty in walking, not elsewhere classified: Secondary | ICD-10-CM | POA: Diagnosis not present

## 2023-11-23 DIAGNOSIS — M79605 Pain in left leg: Secondary | ICD-10-CM

## 2023-11-23 DIAGNOSIS — M6281 Muscle weakness (generalized): Secondary | ICD-10-CM

## 2023-11-23 NOTE — Therapy (Signed)
 OUTPATIENT PHYSICAL THERAPY LOWER EXTREMITY   Patient Name: Kathleen Larson MRN: [id number] DOB:31-Mar-1966, 58 y.o., female Today's Date: 11/23/2023  END OF SESSION:  PT End of Session - 11/23/23 1057     Visit Number 6    Number of Visits 13    Date for PT Re-Evaluation 12/17/23    Authorization Type [employer] MCR    Authorization Time Period 11/05/23 to 12/17/23    Progress Note Due on Visit 10    PT Start Time 1053    PT Stop Time 1135    PT Time Calculation (min) 42 min    Activity Tolerance Patient tolerated treatment well;No increased pain;Patient limited by pain    Behavior During Therapy [facility] for tasks assessed/performed                Past Medical History:  Diagnosis Date   Abnormal EKG    Allergy    Back pain 10/16/2021   CONSTIPATION 03/22/2009   Qualifier: Diagnosis of  By: Gladis FNP, Nykedtra     Diabetes mellitus without complication (HCC)    Epiploic appendagitis 09/26/2018   Esophageal dysphagia 03/10/2018   Essential hypertension    GERD (gastroesophageal reflux disease)    Greater trochanteric bursitis of right hip 05/26/2017   Grief 10/16/2021   Healthcare maintenance 08/14/2019   HELICOBACTER PYLORI INFECTION, HX OF 01/28/2009   Qualifier: Diagnosis of  By: Gladis FNP, Nykedtra     Hyperlipidemia    Hypertension    Knee buckling, left 09/19/2020   Left knee pain 10/18/2020   Lesion of tongue 10/22/2017   LGSIL on Pap smear of cervix 08/14/2019   Medical non-compliance    Mild cognitive impairment 12/27/2017   Neck muscle strain 09/10/2016   Stroke (HCC)    CVA 2017   Type 2 diabetes mellitus with other specified complication (HCC) 06/21/2017   Complication is CVA   Yeast dermatitis 03/01/2019   Past Surgical History:  Procedure Laterality Date   TUBAL LIGATION     Patient Active Problem List   Diagnosis Date Noted   Trochanteric bursitis of left hip 05/05/2023   Sciatica of left side 03/23/2023   Vitreous floaters of both eyes 12/21/2020    Poor dentition 12/21/2020   Type 2 diabetes mellitus with diabetic neuropathy, unspecified (HCC) 03/15/2020   Major depressive disorder, single episode, severe (HCC) 03/10/2018   Mixed conductive and sensorineural hearing loss of both ears 10/22/2017   Tinnitus of both ears 06/21/2017   Gait disorder 03/25/2017   Essential hypertension, benign 05/27/2016   Intracranial vascular stenosis    History of stroke    Obesity    ALLERGIC RHINITIS 03/02/2008   Hyperlipemia, secondary prevention (LDL <70) 07/18/2007    PCP: Cleotilde Perkins DO   REFERRING PROVIDER: Donzetta Quant MD   REFERRING DIAG:  Diagnosis  M70.62 (ICD-10-CM) - Trochanteric bursitis of left hip    THERAPY DIAG:  Pain in left leg  Muscle weakness (generalized)  Difficulty in walking, not elsewhere classified  Other symptoms and signs involving the musculoskeletal system  Rationale for Evaluation and Treatment: Rehabilitation  ONSET DATE: I've been having problems/chronic   SUBJECTIVE:   SUBJECTIVE STATEMENT: Feels about 80% better since starting PT; currently rating pain 7/10   Eval: I've been going downhill since I had my stroke in 2017, I've been dealing with this hip for awhile. Its hard for me to get up if I've been sitting for a long time. Got a shot in the hip that  wore off, it was awhile ago. At worst it can go all the way to my toes and makes them curl, no pattern to it but it doesn't last long.   PERTINENT HISTORY: See above  PAIN:  Are you having pain? Yes: NPRS scale:  7/10 this week  Pain location: low back Pain description: ache, can spasm and be sharp  Aggravating factors: prolonged sitting, prolonged postures, with fatigue  Relieving factors: change of position, extension exercises   PRECAUTIONS: Other: hx of CVA with R weakness   RED FLAGS: None   WEIGHT BEARING RESTRICTIONS: No  FALLS:  Has patient fallen in last 6 months? No  LIVING ENVIRONMENT: Lives with: lives alone Lives  in: House/apartment Stairs: I have little steps to get into the house  Has following equipment at home: Single point cane and Environmental Consultant - 2 wheeled  OCCUPATION: disabled   PLOF: Independent, Independent with basic ADLs, Independent with gait, and Independent with transfers  PATIENT GOALS: get mobility back   NEXT MD VISIT: After PT/unclear   OBJECTIVE:  Note: Objective measures were completed at Evaluation unless otherwise noted.   PATIENT SURVEYS:  Eval: FOTO 41, predicted 47 in 12 visits   COGNITION: Overall cognitive status: No family/caregiver present to determine baseline cognitive functioning     SENSATION: WFL    MUSCLE LENGTH:  R HS/piriformis OK L HS/piriformis moderate limitation   POSTURE: rounded shoulders, forward head, decreased lumbar lordosis, and increased thoracic kyphosis  FLEXIBILITY (Lt/Rt in degrees):  11/18/2023 Hip flexors: 85/80 Hamstrings: 30/30 Hip IR:  13/16 Hip ER: 15/16   LOWER EXTREMITY MMT:  MMT Right eval Left eval  Hip flexion 2 4+  Hip extension    Hip abduction 2+ 3 painful   Hip adduction    Hip internal rotation    Hip external rotation    Knee flexion 3- 5  Knee extension 3 5  Ankle dorsiflexion 2+ 4+   (Blank rows = not tested)   LUMBAR ROM  11/05/23  Lumbar flexion ROM mild limitation, RFIS back more painful  Lumbar extension ROM WNL, REIS improved sx  Lateral lumbar flexion WNL B   Possible extension bias   GAIT: Distance walked: in clinic distances  Assistive device utilized: None Level of assistance: Complete Independence Comments: limited trunk/hip rotation, limited ankle DF R LE/limited knee flexion, trendelenburg                                                                                                                                 TREATMENT DATE:  11/23/23 TherEx NuStep L5 x 8 min Piriformis stretch 3x30 sec bil (figure-4) Bridges 2x10; 5 sec hold Hooklying single limb clamshell 2x10  bil; L4 band Standing hip abduction 2x10 bil; L4 band Leg Press 75# 3x10 bil; single limb 25# 2x10 performed bil   11/18/2023 Figure 4 stretch (push knee away) supine 4 x 20 seconds bilateral Supine hamstrings stretch other leg straight  4 x 20 seconds Bridging 10 x 5 seconds Single knee to chest with other leg straight 4 x 20 seconds bilateral Standing lumbar extension (hips forward) 10 x 3 seconds Alternating hip hike at counter top (good posture) 2 sets of 10 3 seconds  Functional Activities: Reviewed body mechanics for golfers lift, log roll for bed mobility, recommendations for vacuuming, doing dishes, reinforced the importance of avoiding flexed postures   11/16/23:  TherEx:  Nustep: Level 4 x 6 minutes Standing lumbar extension at wall x 10 holding 10 sec Standing hip hikes: x 10 holding 3 sec Sit to stand instructions in body mechanics to assist with lift off x 10 (noes over toes, toes behind knees) Bil piriformis stretch sitting: 2 x 30 sec Bil hamstring stretch sitting:  2 x 30 sec using strap Seated clam shells c green TB  2 x 10  Seated on Green physio-ball Balance with core activation Marching 2 x 10  Opposite arm to leg c marching 2 x 10  Ant/posterior pelvic tilts x 10 c moderate verbal and tactile cues for technique   11/11/2023 Seated figure 4 stretch (modified to supine due to poor mechanics) 4 x 20 seconds bilateral Seated hamstrings stretch with good posture 4 x 20 seconds bilateral Lower trunk/pelvic rotation 3 x 30 seconds Bridging 10 x 5 seconds Standing lumbar extension (hips forward) 10 x 3 seconds Alternating hip hike at counter top (good posture) 2 sets of 10 3 seconds  Functional Activities: Golfer's and diagonal squat lifts for house chores, kitchen sink body mechanics for dishes Sit to stand with slow eccentrics 5 x hands PRN    PATIENT EDUCATION:  Education details: exam findings, POC, HEP  Person educated: Patient Education method:  Programmer, Multimedia, Demonstration, and Handouts Education comprehension: verbalized understanding, returned demonstration, and needs further education  HOME EXERCISE PROGRAM: Access Code: [email or web address] URL: [email or web address] Date: 11/18/2023 Prepared by: Lamar Ivory  Exercises - Seated Figure 4 Piriformis Stretch  - 2 x daily - 1-7 x weekly - 1 sets - 4-5 reps - 30 seconds hold - Seated Hamstring Stretch  - 2 x daily - 1-7 x weekly - 1 sets - 4-5 reps - 30 seconds  hold - Standing Lumbar Extension  - 2 x daily - 1 x weekly - 1 sets - 10 reps - Supine Bridge  - 2 x daily - 7 x weekly - 2 sets - 10 reps - 5 seconds hold - Supine Lower Trunk Rotation  - 2 x daily - 1-7 x weekly - 3 reps - 30 seconds hold - Standing Lumbar Extension at Wall - Forearms  - 5 x daily - 7 x weekly - 1 sets - 5 reps - 3 seconds hold - Supine Figure 4 Piriformis Stretch  - 2 x daily - 7 x weekly - 1 sets - 4-5 reps - 20 seconds hold - Sit to Stand with Armchair  - 2 x daily - 7 x weekly - 1 sets - 5 reps - Standing Hip Hiking  - 3 x daily - 7 x weekly - 1 sets - 10 reps - 3 seconds hold - Supine Hamstring Stretch  - 2 x daily - 7 x weekly - 1 sets - 4-5 reps - 20 seconds hold - Single Knee to Chest Stretch  - 2 x daily - 7 x weekly - 1 sets - 4-5 reps - 20 seconds hold   ASSESSMENT:  CLINICAL IMPRESSION: Session today focused mainly on strengthening activities which she tolerated  well with expected fatigue.  Pt reporting she enjoyed the exercises despite fatigue following session.  Continue skilled PT.  OBJECTIVE IMPAIRMENTS: Abnormal gait, decreased mobility, difficulty walking, decreased strength, increased fascial restrictions, increased muscle spasms, impaired flexibility, postural dysfunction, and pain.   ACTIVITY LIMITATIONS: sitting, standing, squatting, stairs, transfers, and locomotion level  PARTICIPATION LIMITATIONS: driving, shopping, community activity, and occupation  PERSONAL FACTORS:  Age, Behavior pattern, Education, Fitness, Past/current experiences, Social background, and Time since onset of injury/illness/exacerbation are also affecting patient's functional outcome.   REHAB POTENTIAL: Fair chronicity of impairments from CVA likely playing a large role in her symptoms   CLINICAL DECISION MAKING: Evolving/moderate complexity  EVALUATION COMPLEXITY: Moderate   GOALS: Goals reviewed with patient? No  SHORT TERM GOALS: Target date: 11/26/2023     Will be compliant with appropriate progressive HEP  Baseline: Goal status: Met 11/11/2023  2.  L hip pain to be no more than 7/10 at worst  Baseline:  Goal status: On Going 11/18/2023  3.  Will be able to complete all functional transfers without favoring L LE and without increased pain  Baseline:  Goal status: Partially Met 11/18/2023  4.  Flexibility in L LE to have improved by at least 50% Baseline:  Goal status: INITIAL   LONG TERM GOALS: Target date: 12/17/2023    MMT to have improved by at least 1 grade in all weak groups  Baseline:  Goal status: INITIAL  2.  Pain to be no more than 4/10 in L LE  Baseline:  Goal status: INITIAL  3.  Will be able to ambulate community distances as desired without increase in pain from resting levels  Baseline:  Goal status: INITIAL  4.  FOTO score to be at or have exceeded predicted value by time of DC  Baseline:  Goal status: INITIAL    PLAN:  PT FREQUENCY: 2x/week  PT DURATION: 6 weeks  PLANNED INTERVENTIONS: 97164- PT Re-evaluation, 97110-Therapeutic exercises, 97530- Therapeutic activity, 97112- Neuromuscular re-education, 97535- Self Care, [id number]- Manual therapy, [id number]- Aquatic Therapy, [id number]- Ionotophoresis 4mg /ml Dexamethasone, Taping, Dry Needling, Cryotherapy, and Moist heat  PLAN FOR NEXT SESSION: need to address STGs, pt enjoys working on runner, broadcasting/film/video,  Work on Bil LE flexibility, leg and low back strength and conditioning in context of R hemiparesis  and Lt sided overuse after an old CVA.  Lumbar radiculopathy with extension preference.      Corean JULIANNA Ku, PT, DPT 11/23/23 11:42 AM        Date of referral: 09/17/23 Referring provider: Donzetta Quant MD  Referring diagnosis?  Diagnosis  M70.62 (ICD-10-CM) - Trochanteric bursitis of left hip   Treatment diagnosis? (if different than referring diagnosis)   M79.605  M62.81  R26.2  R29.898  What was this (referring dx) caused by? Ongoing Issue  Lysle of Condition: Chronic (continuous duration > 3 months)   Laterality: Lt  Current Functional Measure Score: FOTO 41, prec  Objective measurements identify impairments when they are compared to normal values, the uninvolved extremity, and prior level of function.  [x]  Yes  []  No  Objective assessment of functional ability: Moderate functional limitations   Briefly describe symptoms: constant pain in L LE that has occasional acute flares   How did symptoms start: Chronic symptoms with no clear MOI or event that started her pain- likely from chronic mobility impairments from CVA   Average pain intensity:  Last 24 hours: 7/10  Past week: 7/10  How often does the pt experience symptoms? Constantly  How much have the symptoms interfered with usual daily activities? Moderately  How has condition changed since care began at this facility? NA - initial visit  In general, how is the patients overall health? Fair   BACK PAIN (STarT Back Screening Tool) No

## 2023-11-25 ENCOUNTER — Ambulatory Visit: Payer: 59 | Admitting: Physical Therapy

## 2023-11-25 ENCOUNTER — Encounter: Payer: Self-pay | Admitting: Physical Therapy

## 2023-11-25 DIAGNOSIS — M6281 Muscle weakness (generalized): Secondary | ICD-10-CM | POA: Diagnosis not present

## 2023-11-25 DIAGNOSIS — R29898 Other symptoms and signs involving the musculoskeletal system: Secondary | ICD-10-CM

## 2023-11-25 DIAGNOSIS — R262 Difficulty in walking, not elsewhere classified: Secondary | ICD-10-CM

## 2023-11-25 DIAGNOSIS — M79605 Pain in left leg: Secondary | ICD-10-CM | POA: Diagnosis not present

## 2023-11-25 NOTE — Therapy (Signed)
OUTPATIENT PHYSICAL THERAPY LOWER EXTREMITY   Patient Name: Kathleen Larson MRN: [id number] DOB:1966/10/29, 58 y.o., female Today's Date: 11/25/2023  END OF SESSION:  PT End of Session - 11/25/23 1148     Visit Number 7    Number of Visits 13    Date for PT Re-Evaluation 12/17/23    Authorization Type [employer] MCR    Authorization Time Period 11/05/23 to 12/17/23    Progress Note Due on Visit 10    PT Start Time 1140    PT Stop Time 1220    PT Time Calculation (min) 40 min    Activity Tolerance Patient tolerated treatment well;No increased pain    Behavior During Therapy [facility] for tasks assessed/performed                 Past Medical History:  Diagnosis Date   Abnormal EKG    Allergy    Back pain 10/16/2021   CONSTIPATION 03/22/2009   Qualifier: Diagnosis of  By: Daphine Deutscher FNP, Nykedtra     Diabetes mellitus without complication (HCC)    Epiploic appendagitis 09/26/2018   Esophageal dysphagia 03/10/2018   Essential hypertension    GERD (gastroesophageal reflux disease)    Greater trochanteric bursitis of right hip 05/26/2017   Grief 10/16/2021   Healthcare maintenance 08/14/2019   HELICOBACTER PYLORI INFECTION, HX OF 01/28/2009   Qualifier: Diagnosis of  By: Daphine Deutscher FNP, Nykedtra     Hyperlipidemia    Hypertension    Knee buckling, left 09/19/2020   Left knee pain 10/18/2020   Lesion of tongue 10/22/2017   LGSIL on Pap smear of cervix 08/14/2019   Medical non-compliance    Mild cognitive impairment 12/27/2017   Neck muscle strain 09/10/2016   Stroke (HCC)    CVA 2017   Type 2 diabetes mellitus with other specified complication (HCC) 06/21/2017   Complication is CVA   Yeast dermatitis 03/01/2019   Past Surgical History:  Procedure Laterality Date   TUBAL LIGATION     Patient Active Problem List   Diagnosis Date Noted   Trochanteric bursitis of left hip 05/05/2023   Sciatica of left side 03/23/2023   Vitreous floaters of both eyes 12/21/2020   Poor dentition  12/21/2020   Type 2 diabetes mellitus with diabetic neuropathy, unspecified (HCC) 03/15/2020   Major depressive disorder, single episode, severe (HCC) 03/10/2018   Mixed conductive and sensorineural hearing loss of both ears 10/22/2017   Tinnitus of both ears 06/21/2017   Gait disorder 03/25/2017   Essential hypertension, benign 05/27/2016   Intracranial vascular stenosis    History of stroke    Obesity    ALLERGIC RHINITIS 03/02/2008   Hyperlipemia, secondary prevention (LDL <70) 07/18/2007    PCP: Glendale Chard DO   REFERRING PROVIDER: Burley Saver MD   REFERRING DIAG:  Diagnosis  M70.62 (ICD-10-CM) - Trochanteric bursitis of left hip    THERAPY DIAG:  Pain in left leg  Muscle weakness (generalized)  Difficulty in walking, not elsewhere classified  Other symptoms and signs involving the musculoskeletal system  Rationale for Evaluation and Treatment: Rehabilitation  ONSET DATE: "I've been having problems"/chronic   SUBJECTIVE:   SUBJECTIVE STATEMENT: Reports a lot more pain upon arrival 8/10, not sure why she was up late last night and did not sleep well.    Eval: I've been going downhill since I had my stroke in 2017, I've been dealing with this hip for awhile. Its hard for me to get up if I've been sitting for  a long time. Got a shot in the hip that wore off, it was awhile ago. At worst it can go all the way to my toes and makes them curl, no pattern to it but it doesn't last long.   PERTINENT HISTORY: See above  PAIN:  Are you having pain? Yes: NPRS scale:  8/10 today  Pain location: low back Pain description: ache, can spasm and be sharp  Aggravating factors: prolonged sitting, prolonged postures, with fatigue  Relieving factors: change of position, extension exercises   PRECAUTIONS: Other: hx of CVA with R weakness   RED FLAGS: None   WEIGHT BEARING RESTRICTIONS: No  FALLS:  Has patient fallen in last 6 months? No  LIVING ENVIRONMENT: Lives  with: lives alone Lives in: House/apartment Stairs: "I have little steps to get into the house"  Has following equipment at home: Single point cane and Walker - 2 wheeled  OCCUPATION: disabled   PLOF: Independent, Independent with basic ADLs, Independent with gait, and Independent with transfers  PATIENT GOALS: get mobility back   NEXT MD VISIT: After PT/unclear   OBJECTIVE:  Note: Objective measures were completed at Evaluation unless otherwise noted.   PATIENT SURVEYS:  Eval: FOTO 41, predicted 47 in 12 visits   COGNITION: Overall cognitive status: No family/caregiver present to determine baseline cognitive functioning     SENSATION: WFL    MUSCLE LENGTH:  R HS/piriformis OK L HS/piriformis moderate limitation   POSTURE: rounded shoulders, forward head, decreased lumbar lordosis, and increased thoracic kyphosis  FLEXIBILITY (Lt/Rt in degrees):  11/18/2023 Hip flexors: 85/80 Hamstrings: 30/30 Hip IR:  13/16 Hip ER: 15/16   LOWER EXTREMITY MMT:  MMT Right eval Left eval  Hip flexion 2 4+  Hip extension    Hip abduction 2+ 3 painful   Hip adduction    Hip internal rotation    Hip external rotation    Knee flexion 3- 5  Knee extension 3 5  Ankle dorsiflexion 2+ 4+   (Blank rows = not tested)   LUMBAR ROM  11/05/23  Lumbar flexion ROM mild limitation, RFIS back more painful  Lumbar extension ROM WNL, REIS improved sx  Lateral lumbar flexion WNL B   Possible extension bias   GAIT: Distance walked: in clinic distances  Assistive device utilized: None Level of assistance: Complete Independence Comments: limited trunk/hip rotation, limited ankle DF R LE/limited knee flexion, trendelenburg                                                                                                                                 TREATMENT DATE:  11/25/23 TherEx NuStep L6 x 9 min Piriformis stretch 3x30 sec bil (figure-4) Bridges 2x10; 5 sec hold Hooklying  single limb clamshell 2x10 bil; L4 band Standing hip abduction 2x10 bil; L4 band Leg Press 75# 3x10 bil; single limb 25# 2x10 performed bil Step ups 6 inch step with bilat UE support X 10 Rt  leg, X 10 Lt leg. Seated hamstring stretch 30 sec X 3 bilat  11/23/23 TherEx NuStep L5 x 8 min Piriformis stretch 3x30 sec bil (figure-4) Bridges 2x10; 5 sec hold Hooklying single limb clamshell 2x10 bil; L4 band Standing hip abduction 2x10 bil; L4 band Leg Press 75# 3x10 bil; single limb 25# 2x10 performed bil   11/18/2023 Figure 4 stretch (push knee away) supine 4 x 20 seconds bilateral Supine hamstrings stretch other leg straight 4 x 20 seconds Bridging 10 x 5 seconds Single knee to chest with other leg straight 4 x 20 seconds bilateral Standing lumbar extension (hips forward) 10 x 3 seconds Alternating hip hike at counter top (good posture) 2 sets of 10 3 seconds  Functional Activities: Reviewed body mechanics for golfers lift, log roll for bed mobility, recommendations for vacuuming, doing dishes, reinforced the importance of avoiding flexed postures   PATIENT EDUCATION:  Education details: exam findings, POC, HEP  Person educated: Patient Education method: Explanation, Demonstration, and Handouts Education comprehension: verbalized understanding, returned demonstration, and needs further education  HOME EXERCISE PROGRAM: Access Code: [email or web address] URL: [email or web address] Date: 11/18/2023 Prepared by: Pauletta Browns  Exercises - Seated Figure 4 Piriformis Stretch  - 2 x daily - 1-7 x weekly - 1 sets - 4-5 reps - 30 seconds hold - Seated Hamstring Stretch  - 2 x daily - 1-7 x weekly - 1 sets - 4-5 reps - 30 seconds  hold - Standing Lumbar Extension  - 2 x daily - 1 x weekly - 1 sets - 10 reps - Supine Bridge  - 2 x daily - 7 x weekly - 2 sets - 10 reps - 5 seconds hold - Supine Lower Trunk Rotation  - 2 x daily - 1-7 x weekly - 3 reps - 30 seconds hold - Standing Lumbar  Extension at Wall - Forearms  - 5 x daily - 7 x weekly - 1 sets - 5 reps - 3 seconds hold - Supine Figure 4 Piriformis Stretch  - 2 x daily - 7 x weekly - 1 sets - 4-5 reps - 20 seconds hold - Sit to Stand with Armchair  - 2 x daily - 7 x weekly - 1 sets - 5 reps - Standing Hip Hiking  - 3 x daily - 7 x weekly - 1 sets - 10 reps - 3 seconds hold - Supine Hamstring Stretch  - 2 x daily - 7 x weekly - 1 sets - 4-5 reps - 20 seconds hold - Single Knee to Chest Stretch  - 2 x daily - 7 x weekly - 1 sets - 4-5 reps - 20 seconds hold   ASSESSMENT:  CLINICAL IMPRESSION: Continued with strengthening and stretching program and despite arriving with more pain, she had good tolerance to exercises today. She has met short term goals except did not measure leg flexibility so will measure that next visit.  OBJECTIVE IMPAIRMENTS: Abnormal gait, decreased mobility, difficulty walking, decreased strength, increased fascial restrictions, increased muscle spasms, impaired flexibility, postural dysfunction, and pain.   ACTIVITY LIMITATIONS: sitting, standing, squatting, stairs, transfers, and locomotion level  PARTICIPATION LIMITATIONS: driving, shopping, community activity, and occupation  PERSONAL FACTORS: Age, Behavior pattern, Education, Fitness, Past/current experiences, Social background, and Time since onset of injury/illness/exacerbation are also affecting patient's functional outcome.   REHAB POTENTIAL: Fair chronicity of impairments from CVA likely playing a large role in her symptoms   CLINICAL DECISION MAKING: Evolving/moderate complexity  EVALUATION COMPLEXITY: Moderate   GOALS: Goals reviewed with  patient? No  SHORT TERM GOALS: Target date: 11/26/2023     Will be compliant with appropriate progressive HEP  Baseline: Goal status: Met 11/11/2023  2.  L hip pain to be no more than 7/10 at worst  Baseline:  Goal status: On Going 11/25/2023 (was 8/10 today)  3.  Will be able to complete  all functional transfers without favoring L LE and without increased pain  Baseline:  Goal status: Partially Met 11/25/2023 (can do this with UE support, does favor left LE without UE support  4.  Flexibility in L LE to have improved by at least 50% Baseline:  Goal status: INITIAL   LONG TERM GOALS: Target date: 12/17/2023    MMT to have improved by at least 1 grade in all weak groups  Baseline:  Goal status: INITIAL  2.  Pain to be no more than 4/10 in L LE  Baseline:  Goal status: INITIAL  3.  Will be able to ambulate community distances as desired without increase in pain from resting levels  Baseline:  Goal status: INITIAL  4.  FOTO score to be at or have exceeded predicted value by time of DC  Baseline:  Goal status: INITIAL    PLAN:  PT FREQUENCY: 2x/week  PT DURATION: 6 weeks  PLANNED INTERVENTIONS: 97164- PT Re-evaluation, 97110-Therapeutic exercises, 97530- Therapeutic activity, 97112- Neuromuscular re-education, 97535- Self Care, 65784- Manual therapy, [phone]- Aquatic Therapy, [phone]- Ionotophoresis 4mg /ml Dexamethasone, Taping, Dry Needling, Cryotherapy, and Moist heat  PLAN FOR NEXT SESSION: measure flexibility but last short term goal assessment,  pt enjoys working on Run[employer], broadcasting/film/video,  Work on Bil LE flexibility, leg and low back strength and conditioning in context of R hemiparesis and Lt sided overuse after an old CVA.  Lumbar radiculopathy with extension preference.   Ivery Quale, PT, DPT 11/25/23 12:00 PM   Date of referral: 09/17/23 Referring provider: Burley Saver MD  Referring diagnosis?  Diagnosis  M70.62 (ICD-10-CM) - Trochanteric bursitis of left hip   Treatment diagnosis? (if different than referring diagnosis)   M79.605  M62.81  R26.2  R29.898  What was this (referring dx) caused by? Ongoing Issue  Ashby Dawes of Condition: Chronic (continuous duration > 3 months)   Laterality: Lt  Current Functional Measure Score: FOTO 41,  prec  Objective measurements identify impairments when they are compared to normal values, the uninvolved extremity, and prior level of function.  [x]  Yes  []  No  Objective assessment of functional ability: Moderate functional limitations   Briefly describe symptoms: constant pain in L LE that has occasional acute flares   How did symptoms start: Chronic symptoms with no clear MOI or event that started her pain- likely from chronic mobility impairments from CVA   Average pain intensity:  Last 24 hours: 7/10  Past week: 7/10  How often does the pt experience symptoms? Constantly  How much have the symptoms interfered with usual daily activities? Moderately  How has condition changed since care began at this facility? NA - initial visit  In general, how is the patients overall health? Fair   BACK PAIN (STarT Back Screening Tool) No

## 2023-11-30 ENCOUNTER — Ambulatory Visit (INDEPENDENT_AMBULATORY_CARE_PROVIDER_SITE_OTHER): Payer: 59 | Admitting: Physical Therapy

## 2023-11-30 ENCOUNTER — Encounter: Payer: Self-pay | Admitting: Physical Therapy

## 2023-11-30 DIAGNOSIS — M79605 Pain in left leg: Secondary | ICD-10-CM

## 2023-11-30 DIAGNOSIS — M6281 Muscle weakness (generalized): Secondary | ICD-10-CM

## 2023-11-30 DIAGNOSIS — R262 Difficulty in walking, not elsewhere classified: Secondary | ICD-10-CM

## 2023-11-30 DIAGNOSIS — R29898 Other symptoms and signs involving the musculoskeletal system: Secondary | ICD-10-CM | POA: Diagnosis not present

## 2023-11-30 NOTE — Therapy (Signed)
OUTPATIENT PHYSICAL THERAPY LOWER EXTREMITY   Patient Name: Kathleen Larson MRN: [id number] DOB:Oct 13, 1966, 58 y.o., female Today's Date: 11/30/2023  END OF SESSION:  PT End of Session - 11/30/23 1145     Visit Number 8    Number of Visits 13    Date for PT Re-Evaluation 12/17/23    Authorization Time Period 11/05/23 to 12/17/23    Progress Note Due on Visit 10    PT Start Time 1140    PT Stop Time 1218    PT Time Calculation (min) 38 min    Activity Tolerance Patient tolerated treatment well;No increased pain    Behavior During Therapy [facility] for tasks assessed/performed                  Past Medical History:  Diagnosis Date   Abnormal EKG    Allergy    Back pain 10/16/2021   CONSTIPATION 03/22/2009   Qualifier: Diagnosis of  By: Daphine Deutscher FNP, [place]     Diabetes mellitus without complication (HCC)    Epiploic appendagitis 09/26/2018   Esophageal dysphagia 03/10/2018   Essential hypertension    GERD (gastroesophageal reflux disease)    Greater trochanteric bursitis of right hip 05/26/2017   Grief 10/16/2021   Healthcare maintenance 08/14/2019   HELICOBACTER PYLORI INFECTION, HX OF 01/28/2009   Qualifier: Diagnosis of  By: Daphine Deutscher FNP, [place]kedtra     Hyperlipidemia    Hypertension    Knee buckling, left 09/19/2020   Left knee pain 10/18/2020   Lesion of tongue 10/22/2017   LGSIL on Pap smear of cervix 08/14/2019   Medical non-compliance    Mild cognitive impairment 12/27/2017   Neck muscle strain 09/10/2016   Stroke [facility])    CVA 2017   Type 2 diabetes mellitus with other specified complication (HCC) 06/21/2017   Complication is CVA   Yeast dermatitis 03/01/2019   Past Surgical History:  Procedure Laterality Date   TUBAL LIGATION     Patient Active Problem List   Diagnosis Date Noted   Trochanteric bursitis of left hip 05/05/2023   Sciatica of left side 03/23/2023   Vitreous floaters of both eyes 12/21/2020   Poor dentition 12/21/2020   Type 2 diabetes  mellitus with diabetic neuropathy, unspecified (HCC) 03/15/2020   Major depressive disorder, single episode, severe (HCC) 03/10/2018   Mixed conductive and sensorineural hearing loss of both ears 10/22/2017   Tinnitus of both ears 06/21/2017   Gait disorder 03/25/2017   Essential hypertension, benign 05/27/2016   Intracranial vascular stenosis    History of stroke    Obesity    ALLERGIC RHINITIS 03/02/2008   Hyperlipemia, secondary prevention (LDL <70) 07/18/2007    PCP: Glendale Chard DO   REFERRING PROVIDER: Burley Saver MD   REFERRING DIAG:  Diagnosis  M70.62 (ICD-10-CM) - Trochanteric bursitis of left hip    THERAPY DIAG:  Pain in left leg  Muscle weakness (generalized)  Difficulty in walking, not elsewhere classified  Other symptoms and signs involving the musculoskeletal system  Rationale for Evaluation and Treatment: Rehabilitation  ONSET DATE: "I've been having problems"/chronic   SUBJECTIVE:   SUBJECTIVE STATEMENT: Pt stating things are going rough due to cold weather.    Eval: I've been going downhill since I had my stroke in 2017, I've been dealing with this hip for awhile. Its hard for me to get up if I've been sitting for a long time. Got a shot in the hip that wore off, it was awhile ago. At worst  it can go all the way to my toes and makes them curl, no pattern to it but it doesn't last long.   PERTINENT HISTORY: See above   PAIN:  Are you having pain? Yes: NPRS scale:  9/10 today  Pain location: low back Pain description: ache, can spasm and be sharp  Aggravating factors: prolonged sitting, prolonged postures, with fatigue  Relieving factors: change of position, extension exercises   PRECAUTIONS: Other: hx of CVA with R weakness   RED FLAGS: None   WEIGHT BEARING RESTRICTIONS: No  FALLS:  Has patient fallen in last 6 months? No  LIVING ENVIRONMENT: Lives with: lives alone Lives in: House/apartment Stairs: "I have little steps to get  into the house"  Has following equipment at home: Single point cane and Walker - 2 wheeled  OCCUPATION: disabled   PLOF: Independent, Independent with basic ADLs, Independent with gait, and Independent with transfers  PATIENT GOALS: get mobility back   NEXT MD VISIT: After PT/unclear   OBJECTIVE:  Note: Objective measures were completed at Evaluation unless otherwise noted.   PATIENT SURVEYS:  Eval: FOTO 41, predicted 47 in 12 visits   COGNITION: Overall cognitive status: No family/caregiver present to determine baseline cognitive functioning     SENSATION: WFL    MUSCLE LENGTH:  R HS/piriformis OK L HS/piriformis moderate limitation   POSTURE: rounded shoulders, forward head, decreased lumbar lordosis, and increased thoracic kyphosis  FLEXIBILITY (Lt/Rt in degrees):  11/18/2023 Hip flexors: 85/80 Hamstrings: 30/30 Hip IR: 13/16 Hip ER: 15/16  11/30/2023 Hip flexors: 108/98 active assisted  Hamstrings: 75/62 passive  Hip IR: 24/35 active assisted Hip ER: 55/50 active assisted    LOWER EXTREMITY MMT:  MMT Right eval Left eval Rt / Left  Hip flexion 2 4+ 3 / 5  Hip extension     Hip abduction 2+ 3 painful  4- / 5  Hip adduction     Hip internal rotation     Hip external rotation     Knee flexion 3- 5 4 / 5  Knee extension 3 5 4  / 5   Ankle dorsiflexion 2+ 4+ 3 / 5    (Blank rows = not tested)   LUMBAR ROM  11/05/23  Lumbar flexion ROM mild limitation, RFIS back more painful  Lumbar extension ROM WNL, REIS improved sx  Lateral lumbar flexion WNL B   Possible extension bias   GAIT: Distance walked: in clinic distances  Assistive device utilized: None Level of assistance: Complete Independence Comments: limited trunk/hip rotation, limited ankle DF R LE/limited knee flexion, trendelenburg                                                                                                                                 TREATMENT DATE:   11/25/23 TherEx NuStep L6 x 6 min Seated Piriformis stretch 2 x 30 sec bil (figure-4) Seated glute stretch 2 x 30 sec bil pushing knee away  Bridges 2 x 10; 5 sec hold Leg Press 75# 2 x10 bil; single limb 37# 2x10 performed bil Step ups 6 inch step with bilat UE support x 15 each LE Standing hip extension and abduction green TB 2 x 10 bil LE  Flexibility measurements repeated this visit see chart above    11/25/23 TherEx NuStep L6 x 9 min Piriformis stretch 3x30 sec bil (figure-4) Bridges 2x10; 5 sec hold Hooklying single limb clamshell 2x10 bil; L4 band Standing hip abduction 2x10 bil; L4 band Leg Press 75# 3x10 bil; single limb 25# 2x10 performed bil Step ups 6 inch step with bilat UE support X 10 Rt leg, X 10 Lt leg. Seated hamstring stretch 30 sec X 3 bilat  11/23/23 TherEx NuStep L5 x 8 min Piriformis stretch 3x30 sec bil (figure-4) Bridges 2x10; 5 sec hold Hooklying single limb clamshell 2x10 bil; L4 band Standing hip abduction 2x10 bil; L4 band Leg Press 75# 3x10 bil; single limb 25# 2x10 performed bil     PATIENT EDUCATION:  Education details: exam findings, POC, HEP  Person educated: Patient Education method: Explanation, Demonstration, and Handouts Education comprehension: verbalized understanding, returned demonstration, and needs further education  HOME EXERCISE PROGRAM: Access Code: [email or web address] URL: [email or web address] Date: 11/18/2023 Prepared by: Pauletta Browns  Exercises - Seated Figure 4 Piriformis Stretch  - 2 x daily - 1-7 x weekly - 1 sets - 4-5 reps - 30 seconds hold - Seated Hamstring Stretch  - 2 x daily - 1-7 x weekly - 1 sets - 4-5 reps - 30 seconds  hold - Standing Lumbar Extension  - 2 x daily - 1 x weekly - 1 sets - 10 reps - Supine Bridge  - 2 x daily - 7 x weekly - 2 sets - 10 reps - 5 seconds hold - Supine Lower Trunk Rotation  - 2 x daily - 1-7 x weekly - 3 reps - 30 seconds hold - Standing Lumbar Extension at Wall -  Forearms  - 5 x daily - 7 x weekly - 1 sets - 5 reps - 3 seconds hold - Supine Figure 4 Piriformis Stretch  - 2 x daily - 7 x weekly - 1 sets - 4-5 reps - 20 seconds hold - Sit to Stand with Armchair  - 2 x daily - 7 x weekly - 1 sets - 5 reps - Standing Hip Hiking  - 3 x daily - 7 x weekly - 1 sets - 10 reps - 3 seconds hold - Supine Hamstring Stretch  - 2 x daily - 7 x weekly - 1 sets - 4-5 reps - 20 seconds hold - Single Knee to Chest Stretch  - 2 x daily - 7 x weekly - 1 sets - 4-5 reps - 20 seconds hold   ASSESSMENT:  CLINICAL IMPRESSION: Pt still reporting 9/10 pain in bilateral QL's and hips. Pt stating she has been taking Tylenol every 4 hours for pain. Pt able to tolerate all exercises during session making progress on strengthening and ROM. Recommend continued skilled PT to maximize pt's function.   OBJECTIVE IMPAIRMENTS: Abnormal gait, decreased mobility, difficulty walking, decreased strength, increased fascial restrictions, increased muscle spasms, impaired flexibility, postural dysfunction, and pain.   ACTIVITY LIMITATIONS: sitting, standing, squatting, stairs, transfers, and locomotion level  PARTICIPATION LIMITATIONS: driving, shopping, community activity, and occupation  PERSONAL FACTORS: Age, Behavior pattern, Education, Fitness, Past/current experiences, Social background, and Time since onset of injury/illness/exacerbation are also affecting patient's functional outcome.   REHAB POTENTIAL: Fair  chronicity of impairments from CVA likely playing a large role in her symptoms   CLINICAL DECISION MAKING: Evolving/moderate complexity  EVALUATION COMPLEXITY: Moderate   GOALS: Goals reviewed with patient? No  SHORT TERM GOALS: Target date: 11/26/2023     Will be compliant with appropriate progressive HEP  Baseline: Goal status: Met 11/11/2023  2.  L hip pain to be no more than 7/10 at worst  Baseline:  Goal status: On Going 11/30/2023 (was 9/10 today)  3.  Will be  able to complete all functional transfers without favoring L LE and without increased pain  Baseline:  Goal status: Partially Met 11/25/2023 (can do this with UE support, does favor left LE without UE support  4.  Flexibility in L LE to have improved by at least 50% Baseline:  Goal status: INITIAL   LONG TERM GOALS: Target date: 12/17/2023    MMT to have improved by at least 1 grade in all weak groups  Baseline:  Goal status: INITIAL  2.  Pain to be no more than 4/10 in L LE  Baseline:  Goal status: INITIAL  3.  Will be able to ambulate community distances as desired without increase in pain from resting levels  Baseline:  Goal status: INITIAL  4.  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  Lumbar radiculopathy with extension preference.   Narda Amber, PT, MPT 11/30/23 11:54 AM   11/30/23 11:54 AM   Date of referral: 09/17/23 Referring provider: Burley Saver MD  Referring diagnosis?  Diagnosis  M70.62 (ICD-10-CM) - Trochanteric bursitis of left hip   Treatment diagnosis? (if different than referring diagnosis)   M79.605  M62.81  R26.2  R29.898  What was this (referring dx) caused by? Ongoing Issue  Ashby Dawes of Condition: Chronic (continuous duration > 3 months)   Laterality: Lt  Current  Functional Measure Score: FOTO 41, prec  Objective measurements identify impairments when they are compared to normal values, the uninvolved extremity, and prior level of function.  [x]  Yes  []  No  Objective assessment of functional ability: Moderate functional limitations   Briefly describe symptoms: constant pain in L LE that has occasional acute flares   How did symptoms start: Chronic symptoms with no clear MOI or event that started her pain- likely from chronic mobility impairments from CVA   Average pain intensity:  Last 24 hours: 7/10  Past week: 7/10  How often does the pt experience symptoms? Constantly  How much have the symptoms interfered with usual daily activities? Moderately  How has condition changed since care began at this facility? NA - initial visit  In general, how is the patients overall health? Fair   BACK PAIN (STarT Back Screening Tool) No

## 2023-12-02 ENCOUNTER — Encounter: Payer: Self-pay | Admitting: Rehabilitative and Restorative Service Providers"

## 2023-12-02 ENCOUNTER — Ambulatory Visit: Payer: 59 | Admitting: Rehabilitative and Restorative Service Providers"

## 2023-12-02 DIAGNOSIS — R262 Difficulty in walking, not elsewhere classified: Secondary | ICD-10-CM

## 2023-12-02 DIAGNOSIS — R29898 Other symptoms and signs involving the musculoskeletal system: Secondary | ICD-10-CM

## 2023-12-02 DIAGNOSIS — M6281 Muscle weakness (generalized): Secondary | ICD-10-CM | POA: Diagnosis not present

## 2023-12-02 DIAGNOSIS — M79605 Pain in left leg: Secondary | ICD-10-CM | POA: Diagnosis not present

## 2023-12-02 NOTE — Therapy (Signed)
OUTPATIENT PHYSICAL THERAPY LOWER EXTREMITY TREATMENT  Patient Name: Kathleen Larson MRN: [id number] DOB:[birth year], 58 y.o., female Today's Date: 12/02/2023  END OF SESSION:  PT End of Session - 12/02/23 1149     Visit Number 9    Number of Visits 13    Date for PT Re-Evaluation 12/17/23    Authorization Time Period 11/05/23 to 12/17/23    Progress Note Due on Visit 10    PT Start Time 1148    PT Stop Time 1231    PT Time Calculation (min) 43 min    Activity Tolerance Patient tolerated treatment well;No increased pain    Behavior During Therapy [facility] for tasks assessed/performed             Past Medical History:  Diagnosis Date   Abnormal EKG    Allergy    Back pain 10/16/2021   CONSTIPATION 03/22/2009   Qualifier: Diagnosis of  By: Daphine Deutscher FNP, [place]     Diabetes mellitus without complication (HCC)    Epiploic appendagitis 09/26/2018   Esophageal dysphagia 03/10/2018   Essential hypertension    GERD (gastroesophageal reflux disease)    Greater trochanteric bursitis of right hip 05/26/2017   Grief 10/16/2021   Healthcare maintenance 08/14/2019   HELICOBACTER PYLORI INFECTION, HX OF 01/28/2009   Qualifier: Diagnosis of  By: Daphine Deutscher FNP, [place]kedtra     Hyperlipidemia    Hypertension    Knee buckling, left 09/19/2020   Left knee pain 10/18/2020   Lesion of tongue 10/22/2017   LGSIL on Pap smear of cervix 08/14/2019   Medical non-compliance    Mild cognitive impairment 12/27/2017   Neck muscle strain 09/10/2016   Stroke [facility])    CVA 2017   Type 2 diabetes mellitus with other specified complication (HCC) 06/21/2017   Complication is CVA   Yeast dermatitis 03/01/2019   Past Surgical History:  Procedure Laterality Date   TUBAL LIGATION     Patient Active Problem List   Diagnosis Date Noted   Trochanteric bursitis of left hip 05/05/2023   Sciatica of left side 03/23/2023   Vitreous floaters of both eyes 12/21/2020   Poor dentition 12/21/2020   Type 2 diabetes mellitus  with diabetic neuropathy, unspecified (HCC) 03/15/2020   Major depressive disorder, single episode, severe (HCC) 03/10/2018   Mixed conductive and sensorineural hearing loss of both ears 10/22/2017   Tinnitus of both ears 06/21/2017   Gait disorder 03/25/2017   Essential hypertension, benign 05/27/2016   Intracranial vascular stenosis    History of stroke    Obesity    ALLERGIC RHINITIS 03/02/2008   Hyperlipemia, secondary prevention (LDL <70) 07/18/2007    PCP: Glendale Chard DO   REFERRING PROVIDER: Burley Saver MD   REFERRING DIAG:  Diagnosis  M70.62 (ICD-10-CM) - Trochanteric bursitis of left hip    THERAPY DIAG:  Pain in left leg  Muscle weakness (generalized)  Difficulty in walking, not elsewhere classified  Other symptoms and signs involving the musculoskeletal system  Rationale for Evaluation and Treatment: Rehabilitation  ONSET DATE: "I've been having problems"/chronic   SUBJECTIVE:   SUBJECTIVE STATEMENT: Shakina notes progress with her flexibility and body mechanics since starting PT.  Pain is also better, but remains at an unacceptable level.   Eval: I've been going downhill since I had my stroke in 2017, I've been dealing with this hip for awhile. Its hard for me to get up if I've been sitting for a long time. Got a shot in the hip that wore  off, it was awhile ago. At worst it can go all the way to my toes and makes them curl, no pattern to it but it doesn't last long.   PERTINENT HISTORY: See above   PAIN:  Are you having pain? Yes: NPRS scale:  6-8/10 (had been as high as 10/10 at evaluation) this week Pain location: low back Pain description: ache, can spasm and be sharp  Aggravating factors: prolonged sitting, prolonged postures, with fatigue  Relieving factors: change of position, extension exercises   PRECAUTIONS: Other: hx of CVA with Rt weakness   RED FLAGS: None   WEIGHT BEARING RESTRICTIONS: No  FALLS:  Has patient fallen in last  6 months? No  LIVING ENVIRONMENT: Lives with: lives alone Lives in: House/apartment Stairs: "I have little steps to get into the house"  Has following equipment at home: Single point cane and Walker - 2 wheeled  OCCUPATION: disabled   PLOF: Independent, Independent with basic ADLs, Independent with gait, and Independent with transfers  PATIENT GOALS: get mobility back   NEXT MD VISIT: After PT/unclear   OBJECTIVE:  Note: Objective measures were completed at Evaluation unless otherwise noted.   PATIENT SURVEYS:  Eval: FOTO 41, predicted 47 in 12 visits   COGNITION: Overall cognitive status: No family/caregiver present to determine baseline cognitive functioning     SENSATION: WFL  MUSCLE LENGTH:  R HS/piriformis OK L HS/piriformis moderate limitation   POSTURE: rounded shoulders, forward head, decreased lumbar lordosis, and increased thoracic kyphosis  FLEXIBILITY (Lt/Rt in degrees):  11/18/2023 Hip flexors: 85/80 Hamstrings: 30/30 Hip IR: 13/16 Hip ER: 15/16  11/30/2023 Hip flexors: 108/98 active assisted  Hamstrings: 75/62 passive  Hip IR: 24/35 active assisted Hip ER: 55/50 active assisted  12/02/2023 Hip flexors: 95/95 Hamstrings: 35/40 Hip IR: 15/16 Hip ER: 18/17   LOWER EXTREMITY MMT:  MMT Right eval Left eval Rt / Left  Hip flexion 2 4+ 3 / 5  Hip extension     Hip abduction 2+ 3 painful  4- / 5  Hip adduction     Hip internal rotation     Hip external rotation     Knee flexion 3- 5 4 / 5  Knee extension 3 5 4  / 5   Ankle dorsiflexion 2+ 4+ 3 / 5    (Blank rows = not tested)   LUMBAR ROM  11/05/23  Lumbar flexion ROM mild limitation, RFIS back more painful  Lumbar extension ROM WNL, REIS improved sx  Lateral lumbar flexion WNL B   Possible extension bias   GAIT: Distance walked: in clinic distances  Assistive device utilized: None Level of assistance: Complete Independence Comments: limited trunk/hip rotation, limited ankle DF R  LE/limited knee flexion, trendelenburg                                                                                                                                 TREATMENT DATE:  12/02/2023 Lumbar  extension 10 x 3 seconds Hip hike at counter top 2 sets of 10 for 5 seconds (minimize lateral lean, posture looks good) Yoga Bridge 10 x 6 seconds Single knee to chest stretch 4 x 20 seconds Supine hamstrings stretch 4 x 20 seconds Figure 4 (push) stretch 4 x 20 seconds Prone alternating hip extensions 10 x 3 seconds Prone alternating arm & leg lifts 10 x 3 seconds  Functional Activities:  Review of bed mobility (log roll), golfers and diagonal squat lifts, review of home walking prescription (3 x a week for 20 + minutes)   11/25/23 TherEx NuStep L6 x 6 min Seated Piriformis stretch 2 x 30 sec bil (figure-4) Seated glute stretch 2 x 30 sec bil pushing knee away Bridges 2 x 10; 5 sec hold Leg Press 75# 2 x10 bil; single limb 37# 2x10 performed bil Step ups 6 inch step with bilat UE support x 15 each LE Standing hip extension and abduction green TB 2 x 10 bil LE  Flexibility measurements repeated this visit see chart above   11/25/23 TherEx NuStep L6 x 9 min Piriformis stretch 3x30 sec bil (figure-4) Bridges 2x10; 5 sec hold Hooklying single limb clamshell 2x10 bil; L4 band Standing hip abduction 2x10 bil; L4 band Leg Press 75# 3x10 bil; single limb 25# 2x10 performed bil Step ups 6 inch step with bilat UE support X 10 Rt leg, X 10 Lt leg. Seated hamstring stretch 30 sec X 3 bilat  PATIENT EDUCATION:  Education details: exam findings, POC, HEP  Person educated: Patient Education method: Explanation, Demonstration, and Handouts Education comprehension: verbalized understanding, returned demonstration, and needs further education  HOME EXERCISE PROGRAM: Access Code: [email or web address] URL: [email or web address] Date: 12/02/2023 Prepared by: Pauletta Browns  Exercises - Seated Figure 4 Piriformis Stretch  - 2 x daily - 1-7 x weekly - 1 sets - 4-5 reps - 30 seconds hold - Seated Hamstring Stretch  - 2 x daily - 1-7 x weekly - 1 sets - 4-5 reps - 30 seconds  hold - Standing Lumbar Extension  - 2 x daily - 1 x weekly - 1 sets - 10 reps - Supine Bridge  - 2 x daily - 7 x weekly - 2 sets - 10 reps - 6 seconds hold - Supine Lower Trunk Rotation  - 2 x daily - 1-7 x weekly - 3 reps - 30 seconds hold - Standing Lumbar Extension at Wall - Forearms  - 5 x daily - 7 x weekly - 1 sets - 5 reps - 3 seconds hold - Supine Figure 4 Piriformis Stretch  - 2-3 x daily - 7 x weekly - 1 sets - 5 reps - 20 seconds hold - Sit to Stand with Armchair  - 2 x daily - 7 x weekly - 1 sets - 5 reps - Standing Hip Hiking  - 3 x daily - 7 x weekly - 1 sets - 10 reps - 5 seconds hold - Supine Hamstring Stretch  - 2-3 x daily - 7 x weekly - 1 sets - 5 reps - 20 seconds hold - Single Knee to Chest Stretch  - 1 x daily - 7 x weekly - 1 sets - 4-5 reps - 20 seconds hold - Prone Alternating Arm and Leg Lifts  - 1 x daily - 7 x weekly - 1-2 sets - 10 reps - 3-10 seconds hold - Prone Hip Extension  - 1 x daily - 7 x weekly - 2 sets - 10 reps -  3 seconds hold  ASSESSMENT:  CLINICAL IMPRESSION: Yulisa is making objective active range of motion and flexibility progress as compared to values obtained 11/18/2023.  Pain is better than at evaluation, but remains at levels higher than she would like.  Subjective reports of 6-8/10 pain seem high given movement patterns observed in the clinic but she certainly still has some weakness in key areas (lumbar paraspinals, hip abductors and quadratus lumborum) that would benefit from additional supervised physical therapy.  Recommend FOTO and progress note next visit along with continued supervised physical therapy to meet long-term goals.  OBJECTIVE IMPAIRMENTS: Abnormal gait, decreased mobility, difficulty walking, decreased strength,  increased fascial restrictions, increased muscle spasms, impaired flexibility, postural dysfunction, and pain.   ACTIVITY LIMITATIONS: sitting, standing, squatting, stairs, transfers, and locomotion level  PARTICIPATION LIMITATIONS: driving, shopping, community activity, and occupation  PERSONAL FACTORS: Age, Behavior pattern, Education, Fitness, Past/current experiences, Social background, and Time since onset of injury/illness/exacerbation are also affecting patient's functional outcome.   REHAB POTENTIAL: Fair chronicity of impairments from CVA likely playing a large role in her symptoms   CLINICAL DECISION MAKING: Evolving/moderate complexity  EVALUATION COMPLEXITY: Moderate   GOALS: Goals reviewed with patient? No  SHORT TERM GOALS: Target date: 11/26/2023     Will be compliant with appropriate progressive HEP  Baseline: Goal status: Met 11/11/2023  2.  L hip pain to be no more than 7/10 at worst  Baseline:  Goal status: On Going 12/02/2023  3.  Will be able to complete all functional transfers without favoring L LE and without increased pain  Baseline:  Goal status: Partially Met 12/02/2023 (can do this with UE support, does favor left LE without UE support  4.  Flexibility in L LE to have improved by at least 50% Baseline:  Goal status: INITIAL   LONG TERM GOALS: Target date: 12/17/2023    MMT to have improved by at least 1 grade in all weak groups  Baseline:  Goal status: INITIAL  2.  Pain to be no more than 4/10 in L LE  Baseline:  Goal status: On Going 12/02/2023  3.  Will be able to ambulate community distances as desired without increase in pain from resting levels  Baseline:  Goal status: INITIAL  4.  FOTO score to be at or have exceeded predicted value by time of DC  Baseline:  Goal status: INITIAL    PLAN:  PT FREQUENCY: 2x/week  PT DURATION: 6 weeks  PLANNED INTERVENTIONS: 97164- PT Re-evaluation, 97110-Therapeutic exercises, 97530- Therapeutic  activity, [id number]- Neuromuscular re-education, 97535- Self Care, [id number]- Manual therapy, [id number]- Aquatic Therapy, [phone]- Ionotophoresis 4mg /ml Dexamethasone, Taping, Dry Needling, Cryotherapy, and Moist heat  PLAN FOR NEXT SESSION: Work on Bil LE flexibility, leg and low back strength and conditioning in context of R hemiparesis and Lt sided overuse after an old CVA.  Lumbar radiculopathy with extension preference.  FOTO update and progress note recommended.  Cherlyn Cushing, PT, MPT 12/02/23 4:53 PM   12/02/23 4:53 PM   Date of referral: 09/17/23 Referring provider: Burley Saver MD  Referring diagnosis?  Diagnosis  M70.62 (ICD-10-CM) - Trochanteric bursitis of left hip   Treatment diagnosis? (if different than referring diagnosis)   M79.605  M62.81  R26.2  R29.898  What was this (referring dx) caused by? Ongoing Issue  Ashby Dawes of Condition: Chronic (continuous duration > 3 months)   Laterality: Lt  Current Functional Measure Score: FOTO 41, prec  Objective measurements identify impairments when they are compared to normal  values, the uninvolved extremity, and prior level of function.  [x]  Yes  []  No  Objective assessment of functional ability: Moderate functional limitations   Briefly describe symptoms: constant pain in L LE that has occasional acute flares   How did symptoms start: Chronic symptoms with no clear MOI or event that started her pain- likely from chronic mobility impairments from CVA   Average pain intensity:  Last 24 hours: 7/10  Past week: 7/10  How often does the pt experience symptoms? Constantly  How much have the symptoms interfered with usual daily activities? Moderately  How has condition changed since care began at this facility? NA - initial visit  In general, how is the patients overall health? Fair   BACK PAIN (STarT Back Screening Tool) No

## 2023-12-07 ENCOUNTER — Ambulatory Visit (INDEPENDENT_AMBULATORY_CARE_PROVIDER_SITE_OTHER): Payer: 59 | Admitting: Physical Therapy

## 2023-12-07 ENCOUNTER — Encounter: Payer: Self-pay | Admitting: Physical Therapy

## 2023-12-07 DIAGNOSIS — R29898 Other symptoms and signs involving the musculoskeletal system: Secondary | ICD-10-CM | POA: Diagnosis not present

## 2023-12-07 DIAGNOSIS — M6281 Muscle weakness (generalized): Secondary | ICD-10-CM | POA: Diagnosis not present

## 2023-12-07 DIAGNOSIS — R262 Difficulty in walking, not elsewhere classified: Secondary | ICD-10-CM

## 2023-12-07 DIAGNOSIS — M79605 Pain in left leg: Secondary | ICD-10-CM | POA: Diagnosis not present

## 2023-12-07 NOTE — Therapy (Signed)
OUTPATIENT PHYSICAL THERAPY LOWER EXTREMITY TREATMENT PROGRESS NOTE  Patient Name: Kathleen Larson MRN: [id number] DOB:06-Apr-1966, 58 y.o., female Today's Date: 12/07/2023  Progress Note Reporting Period 11/04/24 to 12/07/2023   See note below for Objective Data and Assessment of Progress/Goals.      END OF SESSION:  PT End of Session - 12/07/23 1146     Visit Number 10    Number of Visits 13    Date for PT Re-Evaluation 12/17/23    Authorization Type [employer] MCR    Authorization Time Period 11/05/23 to 12/17/23    Progress Note Due on Visit 20    PT Start Time 1146    PT Stop Time 1225    PT Time Calculation (min) 39 min    Activity Tolerance Patient tolerated treatment well;No increased pain    Behavior During Therapy [facility] for tasks assessed/performed              Past Medical History:  Diagnosis Date   Abnormal EKG    Allergy    Back pain 10/16/2021   CONSTIPATION 03/22/2009   Qualifier: Diagnosis of  By: Daphine Deutscher FNP, Nykedtra     Diabetes mellitus without complication (HCC)    Epiploic appendagitis 09/26/2018   Esophageal dysphagia 03/10/2018   Essential hypertension    GERD (gastroesophageal reflux disease)    Greater trochanteric bursitis of right hip 05/26/2017   Grief 10/16/2021   Healthcare maintenance 08/14/2019   HELICOBACTER PYLORI INFECTION, HX OF 01/28/2009   Qualifier: Diagnosis of  By: Daphine Deutscher FNP, Nykedtra     Hyperlipidemia    Hypertension    Knee buckling, left 09/19/2020   Left knee pain 10/18/2020   Lesion of tongue 10/22/2017   LGSIL on Pap smear of cervix 08/14/2019   Medical non-compliance    Mild cognitive impairment 12/27/2017   Neck muscle strain 09/10/2016   Stroke (HCC)    CVA 2017   Type 2 diabetes mellitus with other specified complication (HCC) 06/21/2017   Complication is CVA   Yeast dermatitis 03/01/2019   Past Surgical History:  Procedure Laterality Date   TUBAL LIGATION     Patient Active Problem List   Diagnosis Date Noted    Trochanteric bursitis of left hip 05/05/2023   Sciatica of left side 03/23/2023   Vitreous floaters of both eyes 12/21/2020   Poor dentition 12/21/2020   Type 2 diabetes mellitus with diabetic neuropathy, unspecified (HCC) 03/15/2020   Major depressive disorder, single episode, severe (HCC) 03/10/2018   Mixed conductive and sensorineural hearing loss of both ears 10/22/2017   Tinnitus of both ears 06/21/2017   Gait disorder 03/25/2017   Essential hypertension, benign 05/27/2016   Intracranial vascular stenosis    History of stroke    Obesity    ALLERGIC RHINITIS 03/02/2008   Hyperlipemia, secondary prevention (LDL <70) 07/18/2007    PCP: Glendale Chard DO   REFERRING PROVIDER: Burley Saver MD   REFERRING DIAG:  Diagnosis  M70.62 (ICD-10-CM) - Trochanteric bursitis of left hip    THERAPY DIAG:  Pain in left leg  Muscle weakness (generalized)  Difficulty in walking, not elsewhere classified  Other symptoms and signs involving the musculoskeletal system  Rationale for Evaluation and Treatment: Rehabilitation  ONSET DATE: "I've been having problems"/chronic   SUBJECTIVE:   SUBJECTIVE STATEMENT: PT arriving today reporting 9/10 low back pain with more pain reported on the left. Pt stating she doesn't feel well and feels like she is coming down with a "cold".  Eval: I've been going downhill since I had my stroke in 2017, I've been dealing with this hip for awhile. Its hard for me to get up if I've been sitting for a long time. Got a shot in the hip that wore off, it was awhile ago. At worst it can go all the way to my toes and makes them curl, no pattern to it but it doesn't last long.   PERTINENT HISTORY: See above   PAIN:  Are you having pain? Yes: NPRS scale:  9/10 (had been as high as 9-10/10) this week Pain location: low back Pain description: ache, can spasm and be sharp  Aggravating factors: prolonged sitting, prolonged postures, with fatigue  Relieving  factors: change of position, extension exercises   PRECAUTIONS: Other: hx of CVA with Rt weakness   RED FLAGS: None   WEIGHT BEARING RESTRICTIONS: No  FALLS:  Has patient fallen in last 6 months? No  LIVING ENVIRONMENT: Lives with: lives alone Lives in: House/apartment Stairs: "I have little steps to get into the house"  Has following equipment at home: Single point cane and Walker - 2 wheeled  OCCUPATION: disabled   PLOF: Independent, Independent with basic ADLs, Independent with gait, and Independent with transfers  PATIENT GOALS: get mobility back   NEXT MD VISIT: After PT/unclear   OBJECTIVE:  Note: Objective measures were completed at Evaluation unless otherwise noted.   PATIENT SURVEYS:  Eval: FOTO 41, predicted 47 in 12 visits  12/07/23: FOTO update 57%  COGNITION: Overall cognitive status: No family/caregiver present to determine baseline cognitive functioning     SENSATION: WFL  MUSCLE LENGTH:  R HS/piriformis OK L HS/piriformis moderate limitation   POSTURE: rounded shoulders, forward head, decreased lumbar lordosis, and increased thoracic kyphosis  FLEXIBILITY (Lt/Rt in degrees):  11/18/2023 Hip flexors: 85/80 Hamstrings: 30/30 Hip IR: 13/16 Hip ER: 15/16  11/30/2023 Hip flexors: 108/98 active assisted  Hamstrings: 75/62 passive  Hip IR: 24/35 active assisted Hip ER: 55/50 active assisted  12/02/2023 Hip flexors: 95/95 Hamstrings: 35/40 Hip IR: 15/16 Hip ER: 18/17   12/07/2023 (ROM in supine) Hip flexors: 102/98 (active) Hamstrings: 65/62 (passive) Hip IR: 16/20 Hip ER: 18/18   LOWER EXTREMITY MMT:  MMT Right eval Left eval Rt / Left Rt / Left  Hip flexion 2 4+ 3 / 5 3+ / 5  Hip extension      Hip abduction 2+ 3 painful  4- / 5 4 / 5  Hip adduction      Hip internal rotation      Hip external rotation      Knee flexion 3- 5 4 / 5 4 / 5  Knee extension 3 5 4  / 5  4 / 5  Ankle dorsiflexion 2+ 4+ 3 / 5  3 / 5   (Blank rows =  not tested)   LUMBAR ROM  11/05/23  Lumbar flexion ROM mild limitation, RFIS back more painful  Lumbar extension ROM WNL, REIS improved sx  Lateral lumbar flexion WNL B   Possible extension bias   GAIT: Distance walked: in clinic distances  Assistive device utilized: None Level of assistance: Complete Independence Comments: limited trunk/hip rotation, limited ankle DF R LE/limited knee flexion, trendelenburg  TREATMENT DATE:  12/07/23 TherEx Recumbent bike L6 x 6 min Seated Piriformis stretch 2 x 30 sec bil (figure-4) Supine glute stretch 2 x 30 sec bil pushing knee away Bridges 2 x 10; 5 sec hold Side-lying clam shells green TB  2 x 10  Step ups 6 inch step with bilat UE support x 15 each LE Standing hip extension and abduction green TB 2 x 10 bil LE Standing extension c elbows on wall x 10 holding 10 sec   Measurements repeated, goals assessed, FOTO performed    12/02/2023 Lumbar extension 10 x 3 seconds Hip hike at counter top 2 sets of 10 for 5 seconds (minimize lateral lean, posture looks good) Yoga Bridge 10 x 6 seconds Single knee to chest stretch 4 x 20 seconds Supine hamstrings stretch 4 x 20 seconds Figure 4 (push) stretch 4 x 20 seconds Prone alternating hip extensions 10 x 3 seconds Prone alternating arm & leg lifts 10 x 3 seconds  Functional Activities:  Review of bed mobility (log roll), golfers and diagonal squat lifts, review of home walking prescription (3 x a week for 20 + minutes)   11/25/23 TherEx NuStep L6 x 6 min Seated Piriformis stretch 2 x 30 sec bil (figure-4) Seated glute stretch 2 x 30 sec bil pushing knee away Bridges 2 x 10; 5 sec hold Leg Press 75# 2 x10 bil; single limb 37# 2x10 performed bil Step ups 6 inch step with bilat UE support x 15 each LE Standing hip extension and abduction green TB 2 x 10 bil  LE  Flexibility measurements repeated this visit see chart above   PATIENT EDUCATION:  Education details: exam findings, POC, HEP  Person educated: Patient Education method: Explanation, Demonstration, and Handouts Education comprehension: verbalized understanding, returned demonstration, and needs further education  HOME EXERCISE PROGRAM: Access Code: [email or web address] URL: [email or web address] Date: 12/02/2023 Prepared by: Pauletta Browns  Exercises - Seated Figure 4 Piriformis Stretch  - 2 x daily - 1-7 x weekly - 1 sets - 4-5 reps - 30 seconds hold - Seated Hamstring Stretch  - 2 x daily - 1-7 x weekly - 1 sets - 4-5 reps - 30 seconds  hold - Standing Lumbar Extension  - 2 x daily - 1 x weekly - 1 sets - 10 reps - Supine Bridge  - 2 x daily - 7 x weekly - 2 sets - 10 reps - 6 seconds hold - Supine Lower Trunk Rotation  - 2 x daily - 1-7 x weekly - 3 reps - 30 seconds hold - Standing Lumbar Extension at Wall - Forearms  - 5 x daily - 7 x weekly - 1 sets - 5 reps - 3 seconds hold - Supine Figure 4 Piriformis Stretch  - 2-3 x daily - 7 x weekly - 1 sets - 5 reps - 20 seconds hold - Sit to Stand with Armchair  - 2 x daily - 7 x weekly - 1 sets - 5 reps - Standing Hip Hiking  - 3 x daily - 7 x weekly - 1 sets - 10 reps - 5 seconds hold - Supine Hamstring Stretch  - 2-3 x daily - 7 x weekly - 1 sets - 5 reps - 20 seconds hold - Single Knee to Chest Stretch  - 1 x daily - 7 x weekly - 1 sets - 4-5 reps - 20 seconds hold - Prone Alternating Arm and Leg Lifts  - 1 x daily - 7 x weekly - 1-2  sets - 10 reps - 3-10 seconds hold - Prone Hip Extension  - 1 x daily - 7 x weekly - 2 sets - 10 reps - 3 seconds hold  ASSESSMENT:  CLINICAL IMPRESSION: Pt arriving today reporting she doesn't feel well and feels like she if fighting off a cold. FOTO performed this visit and pt scored 57% which proceeded her predicted percentage. Pt still with weakness noted in her Rt LE and limitations in her  active ROM.   OBJECTIVE IMPAIRMENTS: Abnormal gait, decreased mobility, difficulty walking, decreased strength, increased fascial restrictions, increased muscle spasms, impaired flexibility, postural dysfunction, and pain.   ACTIVITY LIMITATIONS: sitting, standing, squatting, stairs, transfers, and locomotion level  PARTICIPATION LIMITATIONS: driving, shopping, community activity, and occupation  PERSONAL FACTORS: Age, Behavior pattern, Education, Fitness, Past/current experiences, Social background, and Time since onset of injury/illness/exacerbation are also affecting patient's functional outcome.   REHAB POTENTIAL: Fair chronicity of impairments from CVA likely playing a large role in her symptoms   CLINICAL DECISION MAKING: Evolving/moderate complexity  EVALUATION COMPLEXITY: Moderate   GOALS: Goals reviewed with patient? No  SHORT TERM GOALS: Target date: 11/26/2023     Will be compliant with appropriate progressive HEP  Baseline: Goal status: Met 11/11/2023  2.  L hip pain to be no more than 7/10 at worst  Baseline:  Goal status: On Going 12/07/2023  3.  Will be able to complete all functional transfers without favoring L LE and without increased pain  Baseline:  Goal status: MET 12/07/23  4.  Flexibility in L LE to have improved by at least 50% Baseline:  Goal status: on-going 12/07/23  LONG TERM GOALS: Target date: 12/17/2023    MMT to have improved by at least 1 grade in all weak groups  Baseline:  Goal status:on-going 12/07/23  2.  Pain to be no more than 4/10 in L LE  Baseline:  Goal status: On Going 12/07/2023  3.  Will be able to ambulate community distances as desired without increase in pain from resting levels  Baseline:  Goal status: On-going 12/07/23 per pt report  4.  FOTO score to be at or have exceeded predicted value by time of DC  Baseline:  Goal status: MET 12/07/23    PLAN:  PT FREQUENCY: 2x/week  PT DURATION: 6 weeks  PLANNED  INTERVENTIONS: 97164- PT Re-evaluation, 97110-Therapeutic exercises, 97530- Therapeutic activity, 97112- Neuromuscular re-education, 97535- Self Care, 16109- Manual therapy, [id number]- Aquatic Therapy, [phone]- Ionotophoresis 4mg /ml Dexamethasone, Taping, Dry Needling, Cryotherapy, and Moist heat  PLAN FOR NEXT SESSION: Work on Bil LE flexibility, leg and low back strength and conditioning in context of R hemiparesis and Lt sided overuse after an old CVA.  Lumbar radiculopathy with extension preference.   Progress note sent on 1/28   Narda Amber, PT, MPT 12/07/23 11:50 AM   12/07/23 11:50 AM      Date of referral: 09/17/23 Referring provider: Burley Saver MD  Referring diagnosis?  Diagnosis  M70.62 (ICD-10-CM) - Trochanteric bursitis of left hip   Treatment diagnosis? (if different than referring diagnosis)   M79.605  M62.81  R26.2  R29.898  What was this (referring dx) caused by? Ongoing Issue  Ashby Dawes of Condition: Chronic (continuous duration > 3 months)   Laterality: Lt  Current Functional Measure Score: FOTO 41, prec  Objective measurements identify impairments when they are compared to normal values, the uninvolved extremity, and prior level of function.  [x]  Yes  []  No  Objective assessment of functional ability: Moderate functional  limitations   Briefly describe symptoms: constant pain in L LE that has occasional acute flares   How did symptoms start: Chronic symptoms with no clear MOI or event that started her pain- likely from chronic mobility impairments from CVA   Average pain intensity:  Last 24 hours: 7/10  Past week: 7/10  How often does the pt experience symptoms? Constantly  How much have the symptoms interfered with usual daily activities? Moderately  How has condition changed since care began at this facility? NA - initial visit  In general, how is the patients overall health? Fair   BACK PAIN (STarT Back Screening Tool) No

## 2023-12-09 ENCOUNTER — Encounter: Payer: Self-pay | Admitting: Rehabilitative and Restorative Service Providers"

## 2023-12-09 ENCOUNTER — Ambulatory Visit: Payer: 59 | Admitting: Rehabilitative and Restorative Service Providers"

## 2023-12-09 DIAGNOSIS — M79605 Pain in left leg: Secondary | ICD-10-CM | POA: Diagnosis not present

## 2023-12-09 DIAGNOSIS — R29898 Other symptoms and signs involving the musculoskeletal system: Secondary | ICD-10-CM | POA: Diagnosis not present

## 2023-12-09 DIAGNOSIS — R262 Difficulty in walking, not elsewhere classified: Secondary | ICD-10-CM

## 2023-12-09 DIAGNOSIS — M6281 Muscle weakness (generalized): Secondary | ICD-10-CM | POA: Diagnosis not present

## 2023-12-09 NOTE — Therapy (Addendum)
 OUTPATIENT PHYSICAL THERAPY LOWER EXTREMITY TREATMENT  PHYSICAL THERAPY DISCHARGE SUMMARY  Visits from Start of Care: 11  Current functional level related to goals / functional outcomes: See note   Remaining deficits: See note   Education / Equipment: HEP   Patient agrees to discharge. Patient goals were Ongoing. Patient is being discharged due to not returning since the last visit.   Patient Name: Kathleen Larson MRN: [id number] DOB:Feb 24, 1966, 58 y.o., female Today's Date: 12/09/2023   END OF SESSION:  PT End of Session - 12/09/23 1143     Visit Number 11    Number of Visits 13    Date for PT Re-Evaluation 12/17/23    Authorization Type [employer] MCR    Authorization Time Period 11/05/23 to 12/17/23    Progress Note Due on Visit 20    PT Start Time 1146    PT Stop Time 1228    PT Time Calculation (min) 42 min    Activity Tolerance Patient tolerated treatment well;No increased pain;Patient limited by fatigue    Behavior During Therapy [facility] for tasks assessed/performed             Past Medical History:  Diagnosis Date   Abnormal EKG    Allergy    Back pain 10/16/2021   CONSTIPATION 03/22/2009   Qualifier: Diagnosis of  By: Gladis FNP, Nykedtra     Diabetes mellitus without complication (HCC)    Epiploic appendagitis 09/26/2018   Esophageal dysphagia 03/10/2018   Essential hypertension    GERD (gastroesophageal reflux disease)    Greater trochanteric bursitis of right hip 05/26/2017   Grief 10/16/2021   Healthcare maintenance 08/14/2019   HELICOBACTER PYLORI INFECTION, HX OF 01/28/2009   Qualifier: Diagnosis of  By: Gladis FNP, Nykedtra     Hyperlipidemia    Hypertension    Knee buckling, left 09/19/2020   Left knee pain 10/18/2020   Lesion of tongue 10/22/2017   LGSIL on Pap smear of cervix 08/14/2019   Medical non-compliance    Mild cognitive impairment 12/27/2017   Neck muscle strain 09/10/2016   Stroke [facility])    CVA 2017   Type 2 diabetes mellitus with other  specified complication (HCC) 06/21/2017   Complication is CVA   Yeast dermatitis 03/01/2019   Past Surgical History:  Procedure Laterality Date   TUBAL LIGATION     Patient Active Problem List   Diagnosis Date Noted   Trochanteric bursitis of left hip 05/05/2023   Sciatica of left side 03/23/2023   Vitreous floaters of both eyes 12/21/2020   Poor dentition 12/21/2020   Type 2 diabetes mellitus with diabetic neuropathy, unspecified (HCC) 03/15/2020   Major depressive disorder, single episode, severe (HCC) 03/10/2018   Mixed conductive and sensorineural hearing loss of both ears 10/22/2017   Tinnitus of both ears 06/21/2017   Gait disorder 03/25/2017   Essential hypertension, benign 05/27/2016   Intracranial vascular stenosis    History of stroke    Obesity    ALLERGIC RHINITIS 03/02/2008   Hyperlipemia, secondary prevention (LDL <70) 07/18/2007    PCP: Cleotilde Perkins DO   REFERRING PROVIDER: Donzetta Quant MD   REFERRING DIAG:  Diagnosis  M70.62 (ICD-10-CM) - Trochanteric bursitis of left hip    THERAPY DIAG:  Pain in left leg  Muscle weakness (generalized)  Difficulty in walking, not elsewhere classified  Other symptoms and signs involving the musculoskeletal system  Rationale for Evaluation and Treatment: Rehabilitation  ONSET DATE: I've been having problems/chronic   SUBJECTIVE:  SUBJECTIVE STATEMENT: Cailynn rates her pain as high as 8/10 today.  She appears to be in no acute distress.  She reports compliance with her home exercises, although she admits she has not been doing them quite as often as recommended.   Eval: I've been going downhill since I had my stroke in 2017, I've been dealing with this hip for awhile. Its hard for me to get up if I've been sitting for a long time. Got a shot in the hip that wore off, it was awhile ago. At worst it can go all the way to my toes and makes them curl, no pattern to it but it doesn't last long.   PERTINENT  HISTORY: See above   PAIN:  Are you having pain? Yes: NPRS scale:  8/10 this week (had been as high as 9-10/10) Pain location: low back Pain description: ache, can spasm and be sharp  Aggravating factors: prolonged sitting, prolonged postures, with fatigue  Relieving factors: change of position, extension exercises   PRECAUTIONS: Other: hx of CVA with Rt weakness   RED FLAGS: None   WEIGHT BEARING RESTRICTIONS: No  FALLS:  Has patient fallen in last 6 months? No  LIVING ENVIRONMENT: Lives with: lives alone Lives in: House/apartment Stairs: I have little steps to get into the house  Has following equipment at home: Single point cane and Environmental Consultant - 2 wheeled  OCCUPATION: disabled   PLOF: Independent, Independent with basic ADLs, Independent with gait, and Independent with transfers  PATIENT GOALS: get mobility back   NEXT MD VISIT: After PT/unclear   OBJECTIVE:  Note: Objective measures were completed at Evaluation unless otherwise noted.   PATIENT SURVEYS:  Eval: FOTO 41, predicted 47 in 12 visits  12/07/23: FOTO update 57%  COGNITION: Overall cognitive status: No family/caregiver present to determine baseline cognitive functioning     SENSATION: WFL  MUSCLE LENGTH:  R HS/piriformis OK L HS/piriformis moderate limitation   POSTURE: rounded shoulders, forward head, decreased lumbar lordosis, and increased thoracic kyphosis  FLEXIBILITY (Lt/Rt in degrees):  11/18/2023 Hip flexors: 85/80 Hamstrings: 30/30 Hip IR: 13/16 Hip ER: 15/16  11/30/2023 Hip flexors: 108/98 active assisted  Hamstrings: 75/62 passive  Hip IR: 24/35 active assisted Hip ER: 55/50 active assisted  12/02/2023 Hip flexors: 95/95 Hamstrings: 35/40 Hip IR: 15/16 Hip ER: 18/17   12/07/2023 (ROM in supine) Hip flexors: 102/98 (active) Hamstrings: 65/62 (passive) Hip IR: 16/20 Hip ER: 18/18   LOWER EXTREMITY MMT:  MMT Right eval Left eval Rt / Left Rt / Left  Hip flexion 2 4+ 3  / 5 3+ / 5  Hip extension      Hip abduction 2+ 3 painful  4- / 5 4 / 5  Hip adduction      Hip internal rotation      Hip external rotation      Knee flexion 3- 5 4 / 5 4 / 5  Knee extension 3 5 4  / 5  4 / 5  Ankle dorsiflexion 2+ 4+ 3 / 5  3 / 5   (Blank rows = not tested)   LUMBAR ROM  11/05/23  Lumbar flexion ROM mild limitation, RFIS back more painful  Lumbar extension ROM WNL, REIS improved sx  Lateral lumbar flexion WNL B   Possible extension bias   GAIT: Distance walked: in clinic distances  Assistive device utilized: None Level of assistance: Complete Independence Comments: limited trunk/hip rotation, limited ankle DF R LE/limited knee flexion, trendelenburg  TREATMENT DATE:  12/09/2023 Lumbar extension 10 x 3 seconds Hip hike at counter top 10 for 5 seconds (minimize lateral lean, posture looks good) Yoga Bridge 10 x 6 seconds Yoga bridge with march 10 x 2 steps each Single knee to chest stretch 4 x 20 seconds Supine hamstrings stretch 4 x 20 seconds Figure 4 (push) stretch 4 x 20 seconds Prone alternating hip extensions 10 x 4 seconds Prone alternating arm & leg lifts 10 x 4 seconds  Functional Activities:  Review of bed mobility (log roll had to review again), practical golfer's and diagonal squat lifts, review of home walking prescription (3 x a week for 20 + minutes, currently up to 15 minutes) Sit to stand 5 times with slow eccentrics   12/07/23 TherEx Recumbent bike L6 x 6 min Seated Piriformis stretch 2 x 30 sec bil (figure-4) Supine glute stretch 2 x 30 sec bil pushing knee away Bridges 2 x 10; 5 sec hold Side-lying clam shells green TB  2 x 10  Step ups 6 inch step with bilat UE support x 15 each LE Standing hip extension and abduction green TB 2 x 10 bil LE Standing extension c elbows on wall x 10 holding 10 sec    Measurements repeated, goals assessed, FOTO performed    12/02/2023 Lumbar extension 10 x 3 seconds Hip hike at counter top 2 sets of 10 for 5 seconds (minimize lateral lean, posture looks good) Yoga Bridge 10 x 6 seconds Single knee to chest stretch 4 x 20 seconds Supine hamstrings stretch 4 x 20 seconds Figure 4 (push) stretch 4 x 20 seconds Prone alternating hip extensions 10 x 3 seconds Prone alternating arm & leg lifts 10 x 3 seconds  Functional Activities:  Review of bed mobility (log roll), golfers and diagonal squat lifts, review of home walking prescription (3 x a week for 20 + minutes)   PATIENT EDUCATION:  Education details: exam findings, POC, HEP  Person educated: Patient Education method: Programmer, Multimedia, Demonstration, and Handouts Education comprehension: verbalized understanding, returned demonstration, and needs further education  HOME EXERCISE PROGRAM: Access Code: [email or web address] URL: [email or web address] Date: 12/02/2023 Prepared by: Lamar Ivory  Exercises - Seated Figure 4 Piriformis Stretch  - 2 x daily - 1-7 x weekly - 1 sets - 4-5 reps - 30 seconds hold - Seated Hamstring Stretch  - 2 x daily - 1-7 x weekly - 1 sets - 4-5 reps - 30 seconds  hold - Standing Lumbar Extension  - 2 x daily - 1 x weekly - 1 sets - 10 reps - Supine Bridge  - 2 x daily - 7 x weekly - 2 sets - 10 reps - 6 seconds hold - Supine Lower Trunk Rotation  - 2 x daily - 1-7 x weekly - 3 reps - 30 seconds hold - Standing Lumbar Extension at Wall - Forearms  - 5 x daily - 7 x weekly - 1 sets - 5 reps - 3 seconds hold - Supine Figure 4 Piriformis Stretch  - 2-3 x daily - 7 x weekly - 1 sets - 5 reps - 20 seconds hold - Sit to Stand with Armchair  - 2 x daily - 7 x weekly - 1 sets - 5 reps - Standing Hip Hiking  - 3 x daily - 7 x weekly - 1 sets - 10 reps - 5 seconds hold - Supine Hamstring Stretch  - 2-3 x daily - 7 x weekly - 1 sets - 5 reps - 20  seconds hold - Single Knee to  Chest Stretch  - 1 x daily - 7 x weekly - 1 sets - 4-5 reps - 20 seconds hold - Prone Alternating Arm and Leg Lifts  - 1 x daily - 7 x weekly - 1-2 sets - 10 reps - 3-10 seconds hold - Prone Hip Extension  - 1 x daily - 7 x weekly - 2 sets - 10 reps - 3 seconds hold  ASSESSMENT:  CLINICAL IMPRESSION: Ell[place]race reports and demonstrates knowledge of her home exercise program.  It does appear she is doing the exercises at home, although not as much is recommended.  I reinforced with Seaira the importance of consistent home exercise program compliance as prescribed to address impairments noted her evaluation.  We also spent quite a bit of time reviewing postural and body mechanics in order to help her avoid flare-ups with daily activities.  Her prognosis to meet the below listed goals is good with adequate compliance and renewed attention to correct posture and body mechanics.  OBJECTIVE IMPAIRMENTS: Abnormal gait, decreased mobility, difficulty walking, decreased strength, increased fascial restrictions, increased muscle spasms, impaired flexibility, postural dysfunction, and pain.   ACTIVITY LIMITATIONS: sitting, standing, squatting, stairs, transfers, and locomotion level  PARTICIPATION LIMITATIONS: driving, shopping, community activity, and occupation  PERSONAL FACTORS: Age, Behavior pattern, Education, Fitness, Past/current experiences, Social background, and Time since onset of injury/illness/exacerbation are also affecting patient's functional outcome.   REHAB POTENTIAL: Fair chronicity of impairments from CVA likely playing a large role in her symptoms   CLINICAL DECISION MAKING: Evolving/moderate complexity  EVALUATION COMPLEXITY: Moderate   GOALS: Goals reviewed with patient? No  SHORT TERM GOALS: Target date: 11/26/2023     Will be compliant with appropriate progressive HEP  Baseline: Goal status: Met 11/11/2023  2.  Lt hip pain to be no more than 7/10 at worst  Baseline:   Goal status: On Going 12/07/2023  3.  Will be able to complete all functional transfers without favoring Lt LE and without increased pain  Baseline:  Goal status: MET 12/07/23  4.  Flexibility in L LE to have improved by at least 50% Baseline:  Goal status: on-going 12/07/23  LONG TERM GOALS: Target date: 12/17/2023    MMT to have improved by at least 1 grade in all weak groups  Baseline:  Goal status:on-going 12/07/23  2.  Pain to be no more than 4/10 in L LE  Baseline:  Goal status: On Going 12/07/2023  3.  Will be able to ambulate community distances as desired without increase in pain from resting levels  Baseline:  Goal status: On-going 12/07/23 per pt report  4.  FOTO score to be at or have exceeded predicted value by time of DC  Baseline:  Goal status: MET 12/07/23    PLAN:  PT FREQUENCY: 2x/week  PT DURATION: 6 weeks  PLANNED INTERVENTIONS: 97164- PT Re-evaluation, 97110-Therapeutic exercises, 97530- Therapeutic activity, 97112- Neuromuscular re-education, 97535- Self Care, [id number]- Manual therapy, [id number]- Aquatic Therapy, [phone]- Ionotophoresis 4mg /ml Dexamethasone, Taping, Dry Needling, Cryotherapy, and Moist heat  PLAN FOR NEXT SESSION: Work on Bil LE flexibility, leg and low back strength and conditioning in context of R hemiparesis and Lt sided overuse after an old CVA.  Lumbar radiculopathy with extension preference.   Progress note sent on 1/28   Myer LELON Ivory, [place], MPT 12/09/23 5:25 PM   12/09/23 5:25 PM      Date of referral: 09/17/23 Referring provider: Donzetta Quant MD  Referring diagnosis?  Diagnosis  M70.62 (ICD-10-CM) - Trochanteric bursitis of left hip   Treatment diagnosis? (if different than referring diagnosis)   M79.605  M62.81  R26.2  R29.898  What was this (referring dx) caused by? Ongoing Issue  Lysle of Condition: Chronic (continuous duration > 3 months)   Laterality: Lt  Current Functional Measure Score: FOTO 41,  prec  Objective measurements identify impairments when they are compared to normal values, the uninvolved extremity, and prior level of function.  [x]  Yes  []  No  Objective assessment of functional ability: Moderate functional limitations   Briefly describe symptoms: constant pain in L LE that has occasional acute flares   How did symptoms start: Chronic symptoms with no clear MOI or event that started her pain- likely from chronic mobility impairments from CVA   Average pain intensity:  Last 24 hours: 7/10  Past week: 7/10  How often does the pt experience symptoms? Constantly  How much have the symptoms interfered with usual daily activities? Moderately  How has condition changed since care began at this facility? NA - initial visit  In general, how is the patients overall health? Fair   BACK PAIN (STarT Back Screening Tool) No

## 2024-01-17 ENCOUNTER — Ambulatory Visit: Payer: 59

## 2024-01-17 VITALS — Ht 64.0 in | Wt 202.0 lb

## 2024-01-17 DIAGNOSIS — Z0000 Encounter for general adult medical examination without abnormal findings: Secondary | ICD-10-CM | POA: Diagnosis not present

## 2024-01-17 NOTE — Progress Notes (Signed)
 Subjective:   Kathleen Larson is a 58 y.o. who presents for a Medicare Wellness preventive visit.  Visit Complete: Virtual I connected with  Kathleen Larson on 01/17/24 by a audio enabled telemedicine application and verified that I am speaking with the correct person using two identifiers.  Patient Location: Home  Provider Location: Office/Clinic  I discussed the limitations of evaluation and management by telemedicine. The patient expressed understanding and agreed to proceed.  Vital Signs: Because this visit was a virtual/telehealth visit, some criteria may be missing or patient reported. Any vitals not documented were not able to be obtained and vitals that have been documented are patient reported.  VideoDeclined- This patient declined Librarian, academic. Therefore the visit was completed with audio only.  AWV Questionnaire: No: Patient Medicare AWV questionnaire was not completed prior to this visit.  Cardiac Risk Factors include: advanced age (>74men, >45 women);diabetes mellitus;dyslipidemia;family history of premature cardiovascular disease;hypertension;obesity (BMI >30kg/m2) (Physical Therapy Exercises at home daily)     Objective:    Today's Vitals   01/17/24 0923 01/17/24 0924  Weight: 202 lb (91.6 kg)   Height: 5\' 4"  (1.626 m)   PainSc: 7  7   PainLoc: Generalized    Body mass index is 34.67 kg/m.     01/17/2024    9:25 AM 11/05/2023    2:37 PM 10/15/2023    1:51 PM 09/17/2023    9:44 AM 05/05/2023   11:10 AM 03/22/2023    9:13 AM 01/29/2023    9:43 AM  Advanced Directives  Does Patient Have a Medical Advance Directive? No No No No No No No  Would patient like information on creating a medical advance directive? No - Patient declined No - Patient declined No - Patient declined No - Patient declined No - Patient declined  No - Patient declined    Current Medications (verified) Outpatient Encounter Medications as of 01/17/2024   Medication Sig   amLODipine (NORVASC) 5 MG tablet Take 1 tablet (5 mg total) by mouth at bedtime.   aspirin 325 MG tablet Take 1 tablet (325 mg total) by mouth daily.   atorvastatin (LIPITOR) 80 MG tablet Take 1 tablet by mouth once daily   Blood Glucose Monitoring Suppl (ONETOUCH VERIO) w/Device KIT Please use to check blood sugars, twice daily. E11.69   Blood Pressure KIT Take your blood pressure one a day or once every other day. Use it while sitting down with your feet on the floor and your back resting in the chair.   clotrimazole (LOTRIMIN) 1 % cream Apply 1 Application topically 2 (two) times daily.   famotidine (PEPCID) 20 MG tablet Take 1 tablet by mouth twice daily   FARXIGA 10 MG TABS tablet Take 1 tablet (10 mg total) by mouth daily.   glucose blood (ONETOUCH VERIO) test strip Please use to check blood sugars, twice daily. E11.69   Lancet Devices (ONE TOUCH DELICA LANCING DEV) MISC Please use to check blood sugars, twice daily. E11.69   losartan-hydrochlorothiazide (HYZAAR) 50-12.5 MG tablet Take 1 tablet by mouth daily.   sertraline (ZOLOFT) 50 MG tablet Take 1 tablet (50 mg total) by mouth daily.   tirzepatide [facility]) 5 MG/0.5ML Pen Inject 5 mg into the skin once a week.   No facility-administered encounter medications on file as of 01/17/2024.    Allergies (verified) Shrimp [shellfish allergy]   History: Past Medical History:  Diagnosis Date   Abnormal EKG    Allergy  Back pain 10/16/2021   CONSTIPATION 03/22/2009   Qualifier: Diagnosis of  By: Daphine Deutscher FNP, [place]kedtra     Diabetes mellitus without complication (HCC)    Epiploic appendagitis 09/26/2018   Esophageal dysphagia 03/10/2018   Essential hypertension    GERD (gastroesophageal reflux disease)    Greater trochanteric bursitis of right hip 05/26/2017   Grief 10/16/2021   Healthcare maintenance 08/14/2019   HELICOBACTER PYLORI INFECTION, HX OF 01/28/2009   Qualifier: Diagnosis of  By: Daphine Deutscher FNP, Nykedtra      Hyperlipidemia    Hypertension    Knee buckling, left 09/19/2020   Left knee pain 10/18/2020   Lesion of tongue 10/22/2017   LGSIL on Pap smear of cervix 08/14/2019   Medical non-compliance    Mild cognitive impairment 12/27/2017   Neck muscle strain 09/10/2016   Stroke [facility])    CVA 2017   Type 2 diabetes mellitus with other specified complication (HCC) 06/21/2017   Complication is CVA   Yeast dermatitis 03/01/2019   Past Surgical History:  Procedure Laterality Date   TUBAL LIGATION     Family History  Problem Relation Age of Onset   Kidney disease Mother    Hypertension Father    Kidney disease Brother    Asthma Daughter    Colon cancer Neg Hx    Stomach cancer Neg Hx    Esophageal cancer Neg Hx    Social History   Socioeconomic History   Marital status: Single    Spouse name: Not on file   Number of children: 1   Years of education: 12   Highest education level: High [employer] graduate  Occupational History   Occupation: Market researcher   Occupation: Forensic scientist    Occupation: Disable  Tobacco Use   Smoking status: Never    Passive exposure: Current   Smokeless tobacco: Never  Vaping Use   Vaping status: Never Used  Substance and Sexual Activity   Alcohol use: Yes    Comment: occassional wine   Drug use: No   Sexual activity: Not Currently  Other Topics Concern   Not on file  Social History Narrative   ** Merged History Encounter **       Patient lives alone in [place]. Patient enjoys watching TV and spending time with her sister and daughter.  Patient enjoyed playing basketball before her CVA in 2017. Patient walks with friends for exercise.    Social Drivers of Corporate investment banker Strain: Low Risk  (01/17/2024)   Overall Financial Resource Strain (CARDIA)    Difficulty of Paying Living Expenses: Not hard at all  Food Insecurity: No Food Insecurity (01/17/2024)   Hunger Vital Sign    Worried About Running Out of Food in the Last Year: Never  true    Ran Out of Food in the Last Year: Never true  Transportation Needs: No Transportation Needs (01/17/2024)   PRAPARE - Administrator, Civil Service (Medical): No    Lack of Transportation (Non-Medical): No  Physical Activity: Insufficiently Active (01/17/2024)   Exercise Vital Sign    Days of Exercise per Week: 7 days    Minutes of Exercise per Session: 20 min  Stress: No Stress Concern Present (01/17/2024)   [employer] of Occupational Health - Occupational Stress Questionnaire    Feeling of Stress : Only a little  Social Connections: Moderately Isolated (01/17/2024)   Social Connection and Isolation Panel [NHANES]    Frequency of Communication with Friends and Family: More  than three times a week    Frequency of Social Gatherings with Friends and Family: More than three times a week    Attends Religious Services: More than 4 times per year    Active Member of [employer] or Organizations: No    Attends Engineer, structural: Never    Marital Status: Never married    Tobacco Counseling Counseling given: Not Answered    Clinical Intake:  Pre-visit preparation completed: Yes  Pain : 0-10 Pain Score: 7  Pain Type: Chronic pain Pain Location: Generalized Pain Orientation: Left     BMI - recorded: 34.67 Nutritional Risks: None Diabetes: Yes CBG done?: No Did pt. bring in CBG monitor from home?: No  How often do you need to have someone help you when you read instructions, pamphlets, or other written materials from your doctor or pharmacy?: 1 - Never What is the last grade level you completed in [employer]?: SOME COLLEGE  Interpreter Needed?: No  Information entered by :: Susie Cassette, LPN.   Activities of Daily Living     01/17/2024    9:29 AM  In your present state of health, do you have any difficulty performing the following activities:  Hearing? 1  Vision? 0  Difficulty concentrating or making decisions? 0  Walking or climbing stairs?  1  Dressing or bathing? 0  Doing errands, shopping? 0  Preparing Food and eating ? N  Using the Toilet? N  In the past six months, have you accidently leaked urine? Y  Do you have problems with loss of bowel control? N  Managing your Medications? N  Managing your Finances? N  Housekeeping or managing your Housekeeping? N    Patient Care Team: Glendale Chard, DO as PCP - General (Family Medicine)  Indicate any recent Medical Services you may have received from other than [facility] providers in the past year (date may be approximate).     Assessment:   This is a routine wellness examination for [facility].  Hearing/Vision screen Hearing Screening - Comments:: Patient has hearing difficulties. Patient wears hearing aids.   Vision Screening - Comments:: Wears reading glasses - up to date with routine eye exams with  [facility]   Goals Addressed             This Visit's Progress    Client understands the importance of follow-up with providers by attending scheduled visits         Depression Screen     01/17/2024    9:28 AM 10/15/2023    1:55 PM 09/17/2023    9:41 AM 05/05/2023   11:10 AM 03/22/2023    9:12 AM 01/12/2023   10:14 AM 12/28/2022    9:09 AM  PHQ 2/9 Scores  PHQ - 2 Score 0 2 1 1 1 5  0  PHQ- 9 Score 0 4 3 2 3 9      Fall Risk     01/17/2024    9:27 AM 12/28/2022    9:09 AM 06/13/2021   11:48 AM 05/15/2021   10:48 AM 09/19/2020    9:57 AM  Fall Risk   Falls in the past year? 0 0 1 1 1   Number falls in past yr: 0 0 0 0 0  Injury with Fall? 0 0 1 1 1   Risk for fall due to : No Fall Risks No Fall Risks     Follow up Falls prevention discussed;Falls evaluation completed Falls evaluation completed       MEDICARE RISK AT HOME:  Medicare Risk at Home Any stairs in or around the home?: No If so, are there any without handrails?: No Home free of loose throw rugs in walkways, pet beds, electrical cords, etc?: Yes Adequate lighting in your home to reduce risk of  falls?: Yes Life alert?: No Use of a cane, walker or w/c?: Yes Grab bars in the bathroom?: Yes Shower chair or bench in shower?: Yes Elevated toilet seat or a handicapped toilet?: No  TIMED UP AND GO:  Was the test performed?  No  Cognitive Function: 6CIT completed    01/17/2024    9:28 AM  MMSE - Mini Mental State Exam  Not completed: Unable to complete        01/17/2024    9:28 AM 12/28/2022    9:12 AM 11/15/2019    9:53 AM  6CIT Screen  What Year? 0 points 0 points 0 points  What month? 0 points 0 points 0 points  What time? 0 points 0 points 0 points  Count back from 20 0 points 0 points 0 points  Months in reverse 0 points 0 points 0 points  Repeat phrase 0 points 2 points 4 points  Total Score 0 points 2 points 4 points    Immunizations Immunization History  Administered Date(s) Administered   Influenza Whole 09/19/2009   Influenza, Seasonal, Injecte, Preservative Fre 10/15/2023   Influenza,inj,Quad PF,6+ Mos 09/10/2016, 10/22/2017, 08/15/2018, 08/14/2019, 09/19/2020, 10/29/2021, 08/20/2022   PFIZER Comirnaty(Gray Top)Covid-19 Tri-Sucrose Vaccine 12/04/2020, 06/20/2021   PFIZER(Purple Top)SARS-COV-2 Vaccination 03/26/2020, 04/16/2020   PNEUMOCOCCAL CONJUGATE-20 10/29/2021   Pfizer Covid-19 Vaccine Bivalent Booster 48yrs & up 10/29/2021   Pfizer(Comirnaty)Fall Seasonal Vaccine 12 years and older 10/15/2023   Pneumococcal Polysaccharide-23 11/09/2005, 10/22/2017   Td 12/10/2005   Tdap 09/10/2016   Zoster Recombinant(Shingrix) 06/20/2021, 04/12/2022, 07/02/2022    Screening Tests Health Maintenance  Topic Date Due   FOOT EXAM  06/27/2021   OPHTHALMOLOGY EXAM  01/30/2023   HEMOGLOBIN A1C  03/16/2024   Diabetic kidney evaluation - eGFR measurement  03/21/2024   Cervical Cancer Screening (HPV/Pap Cotest)  08/13/2024   Diabetic kidney evaluation - Urine ACR  09/16/2024   MAMMOGRAM  11/18/2024   Medicare Annual Wellness (AWV)  01/16/2025   DTaP/Tdap/Td (3 - Td  or Tdap) 09/10/2026   Colonoscopy  05/24/2028   Pneumococcal Vaccine 63-30 Years old  Completed   INFLUENZA VACCINE  Completed   COVID-19 Vaccine  Completed   Hepatitis C Screening  Completed   HIV Screening  Completed   Zoster Vaccines- Shingrix  Completed   HPV VACCINES  Aged Out    Health Maintenance  Health Maintenance Due  Topic Date Due   FOOT EXAM  06/27/2021   OPHTHALMOLOGY EXAM  01/30/2023   Health Maintenance Items Addressed: Yes; Patient completed her eye exam on 09/29/2023 at [facility].  Notes are in chart.   Additional Screening:  Vision Screening: Recommended annual ophthalmology exams for early detection of glaucoma and other disorders of the eye.  Dental Screening: Recommended annual dental exams for proper oral hygiene  Community Resource Referral / Chronic Care Management: CRR required this visit?  No   CCM required this visit?  No     Plan:     I have personally reviewed and noted the following in the patient's chart:   Medical and social history Use of alcohol, tobacco or illicit drugs  Current medications and supplements including opioid prescriptions. Patient is not currently taking opioid prescriptions. Functional ability and status Nutritional  status Physical activity Advanced directives List of other physicians Hospitalizations, surgeries, and ER visits in previous 12 months Vitals Screenings to include cognitive, depression, and falls Referrals and appointments  In addition, I have reviewed and discussed with patient certain preventive protocols, quality metrics, and best practice recommendations. A written personalized care plan for preventive services as well as general preventive health recommendations were provided to patient.     Mickeal Needy, LPN   1/61/0960   After Visit Summary: (MyChart) Due to this being a telephonic visit, the after visit summary with patients personalized plan was offered to patient  via MyChart   Notes: Please refer to Routing Comments.

## 2024-01-17 NOTE — Patient Instructions (Signed)
 Ms. Kathleen Larson , Thank you for taking time to come for your Medicare Wellness Visit. I appreciate your ongoing commitment to your health goals. Please review the following plan we discussed and let me know if I can assist you in the future.   Referrals/Orders/Follow-Ups/Clinician Recommendations: Yes; Keep maintaining your health by keeping your appointments with Dr. Hyacinth Meeker and any specialists that you may see.  Call us if you need anything.  Have a great year!!!!  This is a list of the screening recommended for you and due dates:  Health Maintenance  Topic Date Due   Complete foot exam   06/27/2021   Eye exam for diabetics  01/30/2023   Hemoglobin A1C  03/16/2024   Yearly kidney function blood test for diabetes  03/21/2024   Pap with HPV screening  08/13/2024   Yearly kidney health urinalysis for diabetes  09/16/2024   Mammogram  11/18/2024   Medicare Annual Wellness Visit  01/16/2025   DTaP/Tdap/Td vaccine (3 - Td or Tdap) 09/10/2026   Colon Cancer Screening  05/24/2028   Pneumococcal Vaccination  Completed   Flu Shot  Completed   COVID-19 Vaccine  Completed   Hepatitis C Screening  Completed   HIV Screening  Completed   Zoster (Shingles) Vaccine  Completed   HPV Vaccine  Aged Out    Advanced directives: (Declined) Advance directive discussed with you today. Even though you declined this today, please call our office should you change your mind, and we can give you the proper paperwork for you to fill out.  Next Medicare Annual Wellness Visit scheduled for next year: Yes

## 2024-02-16 ENCOUNTER — Other Ambulatory Visit: Payer: Self-pay | Admitting: Student

## 2024-02-16 ENCOUNTER — Other Ambulatory Visit: Payer: Self-pay | Admitting: Family Medicine

## 2024-02-16 DIAGNOSIS — F322 Major depressive disorder, single episode, severe without psychotic features: Secondary | ICD-10-CM

## 2024-02-16 DIAGNOSIS — K219 Gastro-esophageal reflux disease without esophagitis: Secondary | ICD-10-CM

## 2024-02-16 DIAGNOSIS — G3184 Mild cognitive impairment, so stated: Secondary | ICD-10-CM

## 2024-02-18 ENCOUNTER — Ambulatory Visit (INDEPENDENT_AMBULATORY_CARE_PROVIDER_SITE_OTHER): Admitting: Student

## 2024-02-18 VITALS — BP 128/80 | HR 76 | Ht 64.0 in | Wt 211.6 lb

## 2024-02-18 DIAGNOSIS — Z7985 Long-term (current) use of injectable non-insulin antidiabetic drugs: Secondary | ICD-10-CM

## 2024-02-18 DIAGNOSIS — E1140 Type 2 diabetes mellitus with diabetic neuropathy, unspecified: Secondary | ICD-10-CM

## 2024-02-18 LAB — POCT GLYCOSYLATED HEMOGLOBIN (HGB A1C): HbA1c, POC (controlled diabetic range): 6.1 % (ref 0.0–7.0)

## 2024-02-18 MED ORDER — MOUNJARO 7.5 MG/0.5ML ~~LOC~~ SOAJ: 7.5000 mg | SUBCUTANEOUS | 1 refills | Status: DC

## 2024-02-18 NOTE — Progress Notes (Signed)
    SUBJECTIVE:   CHIEF COMPLAINT / HPI:   Kathleen Larson is a 58 y.o. female presenting for checkup.  She is up-to-date with her colonoscopy, mammogram, Pap smear.  Diabetes/obesity: She has been doing well with Mounjaro 5 mg weekly.  She denies side effects of medication.  PERTINENT  PMH / PSH: reviewed and updated.  OBJECTIVE:   BP 128/80   Pulse 76   Ht 5\' 4"  (1.626 m)   Wt 211 lb 9.6 oz (96 kg)   LMP 10/14/2012   SpO2 97%   BMI 36.32 kg/m   Well-appearing, no acute distress Cardio: Regular rate, regular rhythm, no murmurs on exam. Pulm: Clear, no wheezing, no crackles. No increased work of breathing Abdominal: bowel sounds present, soft, non-tender, non-distended Extremities: no peripheral edema  Neuro: alert and oriented x3, speech normal in content, no facial asymmetry, strength intact and equal bilaterally in UE and LE, pupils equal and reactive to light.  Psych:  Cognition and judgment appear intact. Alert, communicative  and cooperative with normal attention span and concentration. No apparent delusions, illusions, hallucinations    ASSESSMENT/PLAN:   Assessment & Plan Type 2 diabetes mellitus with diabetic neuropathy, without long-term current use of insulin (HCC) Increase Mounjaro to 7.5 mg weekly due to added weight loss benefits.  Patient gained 9 pounds since last visit in March.  Patient instructed to continue medication and follow-up in 3 months. A1c stable. Recheck BMP     Glendale Chard, DO [facility]

## 2024-02-18 NOTE — Patient Instructions (Signed)
 It was great to see you today!  I have sent the increased dose of Mounjaro to your pharmacy.  Please follow-up with me in 6 months for your diabetes.  Or as needed.  Future Appointments  Date Time Provider Department Center  01/18/2025  9:10 AM [facility] ANNUAL WELLNESS VISIT [facility]    Please arrive 15 minutes before your appointment to ensure smooth check in process.    Please call the clinic at [phone] if your symptoms worsen or you have any concerns.  Thank you for allowing me to participate in your care, Dr. Glendale Chard [facility]

## 2024-02-18 NOTE — Assessment & Plan Note (Signed)
 Increase Mounjaro to 7.5 mg weekly due to added weight loss benefits.  Patient gained 9 pounds since last visit in March.  Patient instructed to continue medication and follow-up in 3 months. A1c stable. Recheck BMP

## 2024-02-19 LAB — BASIC METABOLIC PANEL WITH GFR
BUN/Creatinine Ratio: 18 (ref 12–28)
BUN: 16 mg/dL (ref 8–27)
CO2: 22 mmol/L (ref 20–29)
Calcium: 9.7 mg/dL (ref 8.7–10.3)
Chloride: 100 mmol/L (ref 96–106)
Creatinine, Ser: 0.87 mg/dL (ref 0.57–1.00)
Glucose: 89 mg/dL (ref 70–99)
Potassium: 3.6 mmol/L (ref 3.5–5.2)
Sodium: 140 mmol/L (ref 134–144)
eGFR: 76 mL/min/{1.73_m2} (ref >59)

## 2024-05-14 ENCOUNTER — Other Ambulatory Visit: Payer: Self-pay | Admitting: Student

## 2024-05-14 DIAGNOSIS — F322 Major depressive disorder, single episode, severe without psychotic features: Secondary | ICD-10-CM

## 2024-05-14 DIAGNOSIS — G3184 Mild cognitive impairment, so stated: Secondary | ICD-10-CM

## 2024-05-14 DIAGNOSIS — K219 Gastro-esophageal reflux disease without esophagitis: Secondary | ICD-10-CM

## 2024-05-15 ENCOUNTER — Other Ambulatory Visit: Payer: Self-pay | Admitting: Family Medicine

## 2024-05-15 DIAGNOSIS — E785 Hyperlipidemia, unspecified: Secondary | ICD-10-CM

## 2024-06-06 ENCOUNTER — Other Ambulatory Visit: Payer: Self-pay | Admitting: Family Medicine

## 2024-06-06 DIAGNOSIS — I10 Essential (primary) hypertension: Secondary | ICD-10-CM

## 2024-06-06 DIAGNOSIS — R7989 Other specified abnormal findings of blood chemistry: Secondary | ICD-10-CM

## 2024-06-26 ENCOUNTER — Ambulatory Visit: Admitting: Student

## 2024-06-26 VITALS — BP 125/90 | HR 68 | Ht 64.0 in | Wt 207.8 lb

## 2024-06-26 DIAGNOSIS — E1140 Type 2 diabetes mellitus with diabetic neuropathy, unspecified: Secondary | ICD-10-CM | POA: Diagnosis not present

## 2024-06-26 DIAGNOSIS — I10 Essential (primary) hypertension: Secondary | ICD-10-CM | POA: Diagnosis not present

## 2024-06-26 NOTE — Assessment & Plan Note (Addendum)
 Doing well with SGLT2i and GLP1.  No reported side effects.  Check A1c in the fall, continue checking every 6 months

## 2024-06-26 NOTE — Assessment & Plan Note (Addendum)
 BP within goal. Continue amlodipine  5 mg, losartan -hydrochlorothiazide  50-12.5 mg daily.  Check BMP at next visit

## 2024-06-26 NOTE — Progress Notes (Signed)
    SUBJECTIVE:   CHIEF COMPLAINT / HPI:   Kathleen Larson is a 58 y.o. female presenting for follow up for her HTN and T2DM.   T2DM:  Doing well on Mounjaro  7.5 mg weekly. Denies constipation, N/V. Farxiga  10 mg.  Checks her sugars PRN. No highs reported.   HTN:  Compliant on amlodipine  5 mg, losartan -hydrochlorothiazide  50-12.5 mg daily. Denies SE to medications.  BMP checked 02/2024 WNL  PERTINENT  PMH / PSH: reviewed and updated.  OBJECTIVE:   BP (!) 125/90   Pulse 68   Ht 5' 4 (1.626 m)   Wt 207 lb 12.8 oz (94.3 kg)   LMP 10/14/2012   BMI 35.67 kg/m   Well-appearing, no acute distress Cardio: Regular rate, regular rhythm, no murmurs on exam. Pulm: Clear, no wheezing, no crackles. No increased work of breathing Abdominal: bowel sounds present, soft, non-tender, non-distended Extremities: no peripheral edema  Neuro: alert and oriented x3, speech normal in content, no facial asymmetry  ASSESSMENT/PLAN:   Assessment & Plan Essential hypertension, benign BP within goal. Continue amlodipine  5 mg, losartan -hydrochlorothiazide  50-12.5 mg daily.  Check BMP at next visit  Type 2 diabetes mellitus with diabetic neuropathy, without long-term current use of insulin (HCC) Doing well with SGLT2i and GLP1.  No reported side effects.  Check A1c in the fall, continue checking every 6 months    At next visit: Pap smear Blood work  Flu vaccine   Damien Pinal, DO [facility]

## 2024-06-26 NOTE — Patient Instructions (Signed)
 It was great to see you today!   Future Appointments  Date Time Provider Department Center  01/18/2025  9:10 AM [facility] ANNUAL WELLNESS VISIT [facility]    Please arrive 15 minutes before your appointment to ensure smooth check in process.    Please call the clinic at [phone] if your symptoms worsen or you have any concerns.  Thank you for allowing me to participate in your care, Dr. Damien Pinal [facility]

## 2024-08-04 NOTE — Progress Notes (Signed)
 Kathleen Larson                                          MRN: [id number]   08/04/2024   The VBCI Quality Team Specialist reviewed this patient medical record for the purposes of chart review for care gap closure. The following were reviewed: chart review for care gap closure-kidney health evaluation for diabetes:eGFR  and uACR.    VBCI Quality Team

## 2024-08-10 ENCOUNTER — Other Ambulatory Visit: Payer: Self-pay | Admitting: Family Medicine

## 2024-08-10 DIAGNOSIS — E1169 Type 2 diabetes mellitus with other specified complication: Secondary | ICD-10-CM

## 2024-08-11 ENCOUNTER — Other Ambulatory Visit: Payer: Self-pay | Admitting: Student

## 2024-08-11 ENCOUNTER — Other Ambulatory Visit: Payer: Self-pay | Admitting: Family Medicine

## 2024-08-11 DIAGNOSIS — E1169 Type 2 diabetes mellitus with other specified complication: Secondary | ICD-10-CM

## 2024-08-11 DIAGNOSIS — G3184 Mild cognitive impairment, so stated: Secondary | ICD-10-CM

## 2024-08-11 DIAGNOSIS — K219 Gastro-esophageal reflux disease without esophagitis: Secondary | ICD-10-CM

## 2024-08-11 DIAGNOSIS — F322 Major depressive disorder, single episode, severe without psychotic features: Secondary | ICD-10-CM

## 2024-08-29 ENCOUNTER — Ambulatory Visit: Admitting: Student

## 2024-08-29 ENCOUNTER — Encounter: Payer: Self-pay | Admitting: Student

## 2024-08-29 ENCOUNTER — Other Ambulatory Visit (HOSPITAL_COMMUNITY)
Admission: RE | Admit: 2024-08-29 | Discharge: 2024-08-29 | Disposition: A | Discharge status: Home / Self Care | Source: Ambulatory Visit | Attending: Family Medicine | Admitting: Family Medicine

## 2024-08-29 VITALS — BP 140/92 | HR 71 | Ht 64.0 in | Wt 213.8 lb

## 2024-08-29 DIAGNOSIS — G3184 Mild cognitive impairment, so stated: Secondary | ICD-10-CM | POA: Diagnosis not present

## 2024-08-29 DIAGNOSIS — K219 Gastro-esophageal reflux disease without esophagitis: Secondary | ICD-10-CM | POA: Diagnosis not present

## 2024-08-29 DIAGNOSIS — Z1151 Encounter for screening for human papillomavirus (HPV): Secondary | ICD-10-CM | POA: Insufficient documentation

## 2024-08-29 DIAGNOSIS — I10 Essential (primary) hypertension: Secondary | ICD-10-CM

## 2024-08-29 DIAGNOSIS — E1169 Type 2 diabetes mellitus with other specified complication: Secondary | ICD-10-CM

## 2024-08-29 DIAGNOSIS — R7989 Other specified abnormal findings of blood chemistry: Secondary | ICD-10-CM

## 2024-08-29 DIAGNOSIS — E785 Hyperlipidemia, unspecified: Secondary | ICD-10-CM

## 2024-08-29 DIAGNOSIS — Z01419 Encounter for gynecological examination (general) (routine) without abnormal findings: Secondary | ICD-10-CM | POA: Diagnosis present

## 2024-08-29 DIAGNOSIS — F322 Major depressive disorder, single episode, severe without psychotic features: Secondary | ICD-10-CM

## 2024-08-29 DIAGNOSIS — E1140 Type 2 diabetes mellitus with diabetic neuropathy, unspecified: Secondary | ICD-10-CM | POA: Diagnosis not present

## 2024-08-29 DIAGNOSIS — Z124 Encounter for screening for malignant neoplasm of cervix: Secondary | ICD-10-CM | POA: Insufficient documentation

## 2024-08-29 DIAGNOSIS — E66812 Obesity, class 2: Secondary | ICD-10-CM

## 2024-08-29 DIAGNOSIS — Z6836 Body mass index (BMI) 36.0-36.9, adult: Secondary | ICD-10-CM

## 2024-08-29 LAB — POCT GLYCOSYLATED HEMOGLOBIN (HGB A1C): HbA1c, POC (prediabetic range): 6.3 % (ref 5.7–6.4)

## 2024-08-29 MED ORDER — LOSARTAN POTASSIUM-HCTZ 50-12.5 MG PO TABS: 1.0000 | ORAL_TABLET | Freq: Every day | ORAL | 0 refills | Status: DC

## 2024-08-29 MED ORDER — FAMOTIDINE 20 MG PO TABS: 20.0000 mg | ORAL_TABLET | Freq: Two times a day (BID) | ORAL | 0 refills | Status: AC

## 2024-08-29 MED ORDER — ATORVASTATIN CALCIUM 80 MG PO TABS
80.0000 mg | ORAL_TABLET | Freq: Every day | ORAL | 0 refills | Status: AC
Start: 2024-08-29 — End: ?

## 2024-08-29 MED ORDER — MOUNJARO 7.5 MG/0.5ML ~~LOC~~ SOAJ: 7.5000 mg | SUBCUTANEOUS | 1 refills | Status: DC

## 2024-08-29 MED ORDER — ASPIRIN 325 MG PO TABS: 325.0000 mg | ORAL_TABLET | Freq: Every day | ORAL | 8 refills | Status: AC

## 2024-08-29 MED ORDER — FARXIGA 10 MG PO TABS: 10.0000 mg | ORAL_TABLET | Freq: Every day | ORAL | 0 refills | Status: AC

## 2024-08-29 MED ORDER — SERTRALINE HCL 50 MG PO TABS
50.0000 mg | ORAL_TABLET | Freq: Every day | ORAL | 0 refills | Status: AC
Start: 2024-08-29 — End: ?

## 2024-08-29 MED ORDER — AMLODIPINE BESYLATE 5 MG PO TABS: 5.0000 mg | ORAL_TABLET | Freq: Every day | ORAL | 3 refills | Status: AC

## 2024-08-29 NOTE — Assessment & Plan Note (Signed)
 Diabetes well-controlled with A1c of 6.3. - Refill Farxiga  and Mounjaro .

## 2024-08-29 NOTE — Assessment & Plan Note (Signed)
 Weight increased to 213 lbs from 207 lbs. Discussed physical activity and potential thyroid function role. - Encourage walking, aim for 10,000 steps a day. - Order thyroid function test.

## 2024-08-29 NOTE — Assessment & Plan Note (Signed)
 Refilled

## 2024-08-29 NOTE — Assessment & Plan Note (Signed)
 Not within goal, recheck at next visit  Previously well controlled  Continue amlodipine  5 mg, losartan  50 hydrochlorothiazide  12.5 mg

## 2024-08-29 NOTE — Progress Notes (Signed)
    SUBJECTIVE:   CHIEF COMPLAINT / HPI:   Kathleen Larson is a 58 year old female who presents for a routine Pap smear and follow-up on her diabetes management.  Diabetes mellitus management - A1c is currently 6.3%. - Taking Farxiga  and Mounjaro  17.5 mg once weekly. - Expresses concern about weight gain, with weight increasing from 207 to 213 pounds. - Requests refills on all diabetes medications.  PERTINENT  PMH / PSH: reviewed and updated.  OBJECTIVE:   BP (!) 140/92   Pulse 71   Ht 5' 4 (1.626 m)   Wt 213 lb 12.8 oz (97 kg)   LMP 10/14/2012   SpO2 100%   BMI 36.70 kg/m   Well-appearing, no acute distress Cardio: Regular rate, regular rhythm, no murmurs on exam. Pulm: Clear, no wheezing, no crackles. No increased work of breathing Abdominal: bowel sounds present, soft, non-tender, non-distended Extremities: no peripheral edema   Pelvic Exam: MA chaperone present  Normal external genitalia No abnormal discharge  No cervical motion tenderness  Cervix visualized: 1 nabothian cyst noted on 3 o clock position, areas of friability noted around transformation zone   ASSESSMENT/PLAN:   Assessment & Plan Type 2 diabetes mellitus with diabetic neuropathy, without long-term current use of insulin (HCC) Type 2 diabetes mellitus with other specified complication, without long-term current use of insulin (HCC) Diabetes well-controlled with A1c of 6.3. - Refill Farxiga  and Mounjaro . Essential hypertension, benign Not within goal, recheck at next visit  Previously well controlled  Continue amlodipine  5 mg, losartan  50 hydrochlorothiazide  12.5 mg  Hyperlipidemia, unspecified hyperlipidemia type Refilled  Gastroesophageal reflux disease, unspecified whether esophagitis present Refilled  Elevated serum creatinine Recheck BMP today Class 2 severe obesity due to excess calories with serious comorbidity and body mass index (BMI) of 36.0 to 36.9 in adult Weight increased to  213 lbs from 207 lbs. Discussed physical activity and potential thyroid function role. - Encourage walking, aim for 10,000 steps a day. - Order thyroid function test. Cervical cancer screening Pap completed      Damien Pinal, DO [facility]

## 2024-08-29 NOTE — Patient Instructions (Signed)
 For your pain you can try several over the counter topical medications. Topical medications are a great option to treat pain as you can place the medication right where you are hurting and they have less side effects.   Voltaren Gel/Diclofenac Gel: this is an anti-inflammatory cream (same ingredient as Motrin/Ibuprofen/Aleve) can be applied up to 4 times per day.     Lidocaine patch: can be applied for 12 hours and after needs a 12 hour break to continue to be effective. Works by using numbing medication to help with pain relief. Can be found at any pharmacy over the counter or on [email or web address]    Capsaicin cream: made from the same ingredient that makes hot peppers spicy, this works by numbing up the area of pain. Please wash your hands and do not touch your eyes after applying the cream as active ingredient can hurt your eyes.

## 2024-08-30 ENCOUNTER — Ambulatory Visit: Payer: Self-pay | Admitting: Student

## 2024-08-30 LAB — BASIC METABOLIC PANEL WITH GFR
BUN/Creatinine Ratio: 18 (ref 12–28)
BUN: 20 mg/dL (ref 8–27)
CO2: 21 mmol/L (ref 20–29)
Calcium: 10 mg/dL (ref 8.7–10.3)
Chloride: 101 mmol/L (ref 96–106)
Creatinine, Ser: 1.11 mg/dL — ABNORMAL HIGH (ref 0.57–1.00)
Glucose: 89 mg/dL (ref 70–99)
Potassium: 4 mmol/L (ref 3.5–5.2)
Sodium: 142 mmol/L (ref 134–144)
eGFR: 57 mL/min/1.73 — ABNORMAL LOW (ref >59)

## 2024-08-31 LAB — CYTOLOGY - PAP
Comment: NEGATIVE
Diagnosis: NEGATIVE
High risk HPV: NEGATIVE

## 2024-09-07 ENCOUNTER — Other Ambulatory Visit: Payer: Self-pay | Admitting: Student

## 2024-09-07 DIAGNOSIS — R7989 Other specified abnormal findings of blood chemistry: Secondary | ICD-10-CM

## 2024-09-07 DIAGNOSIS — I10 Essential (primary) hypertension: Secondary | ICD-10-CM

## 2024-09-21 ENCOUNTER — Telehealth: Payer: Self-pay | Admitting: Pharmacist

## 2024-09-21 NOTE — Telephone Encounter (Addendum)
 Patient contacted for follow-up of DM QI'25. Reach out to schedule patient with PCP for lipid panel, UACR and repeat A1C.  Scheduled appointment with Dr. Cleotilde on 10:10 AM 10/17/24.  Total time with patient call and documentation of interaction: 6 minutes.

## 2024-10-03 NOTE — Progress Notes (Signed)
 Kathleen Larson                                          MRN: [id number]   10/03/2024   The [facility] Quality Team Specialist reviewed this patient medical record for the purposes of chart review for care gap closure. The following were reviewed: abstraction for care gap closure-glycemic status assessment.    [facility] Quality Team

## 2024-10-16 NOTE — Progress Notes (Signed)
 Kathleen Larson                                          MRN: [id number]   10/16/2024   The [facility] Quality Team Specialist reviewed this patient medical record for the purposes of chart review for care gap closure. The following were reviewed: chart review for care gap closure-kidney health evaluation for diabetes:eGFR  and uACR.    [facility] Quality Team

## 2024-10-17 ENCOUNTER — Ambulatory Visit: Admitting: Student

## 2024-10-17 ENCOUNTER — Encounter: Payer: Self-pay | Admitting: Student

## 2024-10-17 VITALS — BP 122/80 | HR 70 | Wt 213.0 lb

## 2024-10-17 DIAGNOSIS — I10 Essential (primary) hypertension: Secondary | ICD-10-CM

## 2024-10-17 DIAGNOSIS — E1140 Type 2 diabetes mellitus with diabetic neuropathy, unspecified: Secondary | ICD-10-CM

## 2024-10-17 MED ORDER — MOUNJARO 10 MG/0.5ML ~~LOC~~ SOAJ: 10.0000 mg | SUBCUTANEOUS | 2 refills | Status: AC

## 2024-10-17 NOTE — Assessment & Plan Note (Signed)
 Check urine microalbumin and Lipid panel today  Weight remains stable Increase Mounjaro  to 10 mg weekly  Recheck weight in 4 weeks

## 2024-10-17 NOTE — Assessment & Plan Note (Addendum)
 Blood pressure at goal  Continue amlodipine  5 mg and losartan -hydrochlorothiazide  50-12.5 mg daily

## 2024-10-17 NOTE — Progress Notes (Signed)
    SUBJECTIVE:   CHIEF COMPLAINT / HPI:   Kathleen Larson is a 58 y.o. female presenting for follow up.   T2DM:  Last A1c 6.3 on 08/29/24 Medications: Farxiga  10 mg daily, Mounjaro  7.5 mg weekly  Last weight 213 lb   PERTINENT  PMH / PSH: reviewed and updated.  OBJECTIVE:   BP 122/80   Pulse 70   Wt 213 lb (96.6 kg)   LMP 10/14/2012   SpO2 98%   BMI 36.56 kg/m   Well-appearing, no acute distress Cardio: Regular rate, regular rhythm, no murmurs on exam. Pulm: Clear, no wheezing, no crackles. No increased work of breathing Abdominal: bowel sounds present, soft, non-tender, non-distended Extremities: no peripheral edema   ASSESSMENT/PLAN:   Assessment & Plan Type 2 diabetes mellitus with diabetic neuropathy, without long-term current use of insulin (HCC) Check urine microalbumin and Lipid panel today  Weight remains stable Increase Mounjaro  to 10 mg weekly  Recheck weight in 4 weeks  Essential hypertension, benign Blood pressure at goal  Continue amlodipine  5 mg and losartan -hydrochlorothiazide  50-12.5 mg daily      Damien Pinal, DO [facility]

## 2024-10-17 NOTE — Patient Instructions (Addendum)
 It was great to see you today!   I am increasing your Mounjaro  to 10 mg weekly.  Follow up in 4 weeks for a weight check   I have ordered lab work today. I will send you a message through MyChart or send you a letter with your results. If there is an abnormal result, I will give you a call.    Future Appointments  Date Time Provider Department Center  01/18/2025  9:10 AM [facility] ANNUAL WELLNESS VISIT [facility]    Please arrive 15 minutes before your appointment to ensure smooth check in process.  If you are more than 15 minutes late, you may be asked to reschedule.   Please bring a list of your medications with you to all appointments.   Please call the clinic at [phone] if your symptoms worsen or you have any concerns.  Thank you for allowing me to participate in your care, Dr. Damien Pinal [facility]

## 2024-10-18 LAB — LIPID PANEL
Chol/HDL Ratio: 2.5 ratio (ref 0.0–4.4)
Cholesterol, Total: 171 mg/dL (ref 100–199)
HDL: 68 mg/dL (ref >39)
LDL Chol Calc (NIH): 88 mg/dL (ref 0–99)
Triglycerides: 83 mg/dL (ref 0–149)
VLDL Cholesterol Cal: 15 mg/dL (ref 5–40)

## 2024-10-18 LAB — BASIC METABOLIC PANEL WITH GFR
BUN/Creatinine Ratio: 12 (ref 12–28)
BUN: 12 mg/dL (ref 8–27)
CO2: 21 mmol/L (ref 20–29)
Calcium: 9.8 mg/dL (ref 8.7–10.3)
Chloride: 100 mmol/L (ref 96–106)
Creatinine, Ser: 0.97 mg/dL (ref 0.57–1.00)
Glucose: 107 mg/dL — ABNORMAL HIGH (ref 70–99)
Sodium: 141 mmol/L (ref 134–144)
eGFR: 67 mL/min/1.73 (ref >59)

## 2024-10-18 LAB — MICROALBUMIN / CREATININE URINE RATIO
Creatinine, Urine: 8.7 mg/dL
Microalbumin, Urine: <3 ug/mL

## 2024-10-19 ENCOUNTER — Ambulatory Visit: Payer: Self-pay | Admitting: Student

## 2024-10-30 ENCOUNTER — Other Ambulatory Visit: Payer: Self-pay | Admitting: Family Medicine

## 2024-10-30 DIAGNOSIS — B353 Tinea pedis: Secondary | ICD-10-CM

## 2024-10-30 NOTE — Progress Notes (Signed)
 Kathleen Larson                                          MRN: [id number]   10/30/2024   The [facility] Quality Team Specialist reviewed this patient medical record for the purposes of chart review for care gap closure. The following were reviewed: abstraction for care gap closure-kidney health evaluation for diabetes:eGFR  and uACR.    [facility] Quality Team

## 2025-01-18 ENCOUNTER — Encounter
